# Patient Record
Sex: Female | Born: 1995 | Race: Black or African American | Hispanic: No | Marital: Single | Smoking: Never smoker
Health system: Southern US, Community
[De-identification: ages and names within clinical notes are randomized; demographics above are authoritative.]

## PROBLEM LIST (undated history)

## (undated) ENCOUNTER — Inpatient Hospital Stay (HOSPITAL_COMMUNITY): Payer: Self-pay

## (undated) DIAGNOSIS — G43009 Migraine without aura, not intractable, without status migrainosus: Secondary | ICD-10-CM

## (undated) DIAGNOSIS — F329 Major depressive disorder, single episode, unspecified: Secondary | ICD-10-CM

## (undated) DIAGNOSIS — D571 Sickle-cell disease without crisis: Secondary | ICD-10-CM

## (undated) DIAGNOSIS — F32A Depression, unspecified: Secondary | ICD-10-CM

## (undated) DIAGNOSIS — N390 Urinary tract infection, site not specified: Secondary | ICD-10-CM

## (undated) HISTORY — DX: Depression, unspecified: F32.A

## (undated) HISTORY — PX: NO PAST SURGERIES: SHX2092

## (undated) HISTORY — DX: Migraine without aura, not intractable, without status migrainosus: G43.009

## (undated) HISTORY — PX: WISDOM TOOTH EXTRACTION: SHX21

## (undated) HISTORY — DX: Major depressive disorder, single episode, unspecified: F32.9

## (undated) SURGERY — Surgical Case
Anesthesia: *Unknown

---
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
## 2013-02-02 ENCOUNTER — Emergency Department (INDEPENDENT_AMBULATORY_CARE_PROVIDER_SITE_OTHER)
Admission: EM | Admit: 2013-02-02 | Discharge: 2013-02-02 | Disposition: A | Discharge status: Home / Self Care | Payer: No Typology Code available for payment source | Source: Home / Self Care | Attending: Emergency Medicine | Admitting: Emergency Medicine

## 2013-02-02 ENCOUNTER — Telehealth: Payer: Self-pay

## 2013-02-02 ENCOUNTER — Encounter (HOSPITAL_COMMUNITY): Payer: Self-pay | Admitting: Emergency Medicine

## 2013-02-02 DIAGNOSIS — N3000 Acute cystitis without hematuria: Secondary | ICD-10-CM

## 2013-02-02 LAB — POCT URINALYSIS DIP (DEVICE)
Nitrite: POSITIVE — AB
Protein, ur: NEGATIVE mg/dL
pH: 7 (ref 5.0–8.0)

## 2013-02-02 MED ORDER — CEPHALEXIN 500 MG PO CAPS: 500.0000 mg | ORAL_CAPSULE | Freq: Three times a day (TID) | ORAL | Status: DC

## 2013-02-02 NOTE — ED Notes (Signed)
Patient not in room-in bathroom collecting specimen

## 2013-02-02 NOTE — ED Notes (Signed)
Patient reports having uti symptoms.  Reports onset yesterday.  Reports burning with urination, frequency with small amounts of urine.  Started otc azo yesterday-has had one dose

## 2013-02-02 NOTE — ED Provider Notes (Signed)
Chief Complaint:   Chief Complaint  Patient presents with  . Urinary Tract Infection    History of Present Illness:   Makayla Fletcher is an 17 year old female with [place] disease who is followed at the sickle cell clinic at [facility]. She presents today with a two-day history of urinary frequency, urgency, and dysuria. She denies any hematuria or malodorous urine. She's had some abdominal soreness but denies any back pain, fever, chills, nausea, vomiting, or GYN symptoms. She's had 2 UTIs in the past. She thinks that use of a tampon might have brought this about. She has never had to be hospitalized or received intravenous fluids her antibiotics for urinary tract infection.  Review of Systems:  Other than noted above, the patient denies any of the following symptoms: General:  No fevers, chills, sweats, aches, or fatigue. GI:  No abdominal pain, back pain, nausea, vomiting, diarrhea, or constipation. GU:  No dysuria, frequency, urgency, hematuria, or incontinence. GYN:  No discharge, itching, vulvar pain or lesions, pelvic pain, or abnormal vaginal bleeding.  PMFSH:  Past medical history, family history, social history, meds, and allergies were reviewed.  She takes birth control pills.  Physical Exam:   Vital signs:  BP 114/77  Pulse 90  Temp(Src) 98 F (36.7 C) (Oral)  Resp 16  SpO2 100%  LMP 01/31/2013 Gen:  Alert, oriented, in no distress. Lungs:  Clear to auscultation, no wheezes, rales or rhonchi. Heart:  Regular rhythm, no gallop or murmer. Abdomen:  Flat and soft. There was slight suprapubic pain to palpation.  No guarding, or rebound.  No hepato-splenomegaly or mass.  Bowel sounds were normally active.  No hernia. Back:  No CVA tenderness.  Skin:  Clear, warm and dry.  Labs:    Results for orders placed during the hospital encounter of 02/02/13  POCT URINALYSIS DIP (DEVICE)      Result Value Range   Glucose, UA 100 (*) NEGATIVE mg/dL   Bilirubin Urine NEGATIVE   NEGATIVE   Ketones, ur NEGATIVE  NEGATIVE mg/dL   Specific Gravity, Urine 1.015  1.005 - 1.030   Hgb urine dipstick TRACE (*) NEGATIVE   pH 7.0  5.0 - 8.0   Protein, ur NEGATIVE  NEGATIVE mg/dL   Urobilinogen, UA 1.0  0.0 - 1.0 mg/dL   Nitrite POSITIVE (*) NEGATIVE   Leukocytes, UA MODERATE (*) NEGATIVE  POCT PREGNANCY, URINE      Result Value Range   Preg Test, Ur NEGATIVE  NEGATIVE     A urine culture was obtained.  Results are pending at this time and we will call about any positive results.  Assessment: The encounter diagnosis was Acute cystitis.   No evidence of acute pyelonephritis.  Plan:   1.  The following meds were prescribed:   New Prescriptions   CEPHALEXIN (KEFLEX) 500 MG CAPSULE    Take 1 capsule (500 mg total) by mouth 3 (three) times daily.   2.  The patient was instructed in symptomatic care and handouts were given. 3.  The patient was told to return if becoming worse in any way, if no better in 3 or 4 days, and given some red flag symptoms such as fever, vomiting, or severe pain that would indicate earlier return. 4.  The patient was told to avoid intercourse for 10 days, get extra fluids, and return for a follow up with her primary care doctor at the completion of treatment for a repeat UA and culture.  Reuben Likes, MD 02/02/13 [phone]

## 2013-02-02 NOTE — ED Notes (Signed)
Urine sample in lab.  

## 2013-02-02 NOTE — Telephone Encounter (Signed)
This CM spoke with Makayla Fletcher to schedule a follow up appt with Dr. Ashley Royalty on 02/13/13@ 4pm. This is in lieu of her appt today to urgent care.     Karoline Caldwell, RN, BSN, [place]   [phone]

## 2013-02-04 LAB — URINE CULTURE

## 2013-02-04 NOTE — ED Notes (Signed)
Urine culture: >100,000 colonies Klebsiella pneumoniae.  Pt. adequately treated with Keflex. Vassie Moselle 02/04/2013

## 2013-02-09 ENCOUNTER — Emergency Department (INDEPENDENT_AMBULATORY_CARE_PROVIDER_SITE_OTHER)
Admission: EM | Admit: 2013-02-09 | Discharge: 2013-02-09 | Disposition: A | Discharge status: Home / Self Care | Payer: No Typology Code available for payment source | Source: Home / Self Care | Attending: Emergency Medicine | Admitting: Emergency Medicine

## 2013-02-09 ENCOUNTER — Ambulatory Visit: Payer: No Typology Code available for payment source | Admitting: Internal Medicine

## 2013-02-09 ENCOUNTER — Encounter (HOSPITAL_COMMUNITY): Payer: Self-pay | Admitting: Emergency Medicine

## 2013-02-09 DIAGNOSIS — N3000 Acute cystitis without hematuria: Secondary | ICD-10-CM

## 2013-02-09 DIAGNOSIS — B373 Candidiasis of vulva and vagina: Secondary | ICD-10-CM

## 2013-02-09 LAB — POCT URINALYSIS DIP (DEVICE)
Hgb urine dipstick: NEGATIVE
Ketones, ur: NEGATIVE mg/dL
Protein, ur: NEGATIVE mg/dL
Specific Gravity, Urine: 1.02 (ref 1.005–1.030)
pH: 7 (ref 5.0–8.0)

## 2013-02-09 LAB — POCT PREGNANCY, URINE: Preg Test, Ur: NEGATIVE

## 2013-02-09 MED ORDER — FLUCONAZOLE 150 MG PO TABS: 150.0000 mg | ORAL_TABLET | Freq: Once | ORAL | Status: DC

## 2013-02-09 NOTE — ED Provider Notes (Addendum)
Chief Complaint:   Chief Complaint  Patient presents with  . Vaginal Discharge    History of Present Illness:   Makayla Fletcher is an 17 year old female whom I saw her about a week ago for a UTI. She is finishing up her prescription for cephalexin. She has had improvement in a UTI symptoms. No dysuria, frequency, urgency, or hematuria. She denies fever, abdominal pain, or lower back pain. Her on another complaint has been vaginal itching and discharge. She denies any prior history of yeast infection. Her urine culture grew out Klebsiella pneumoniae which was sensitive to cephalexin.  Review of Systems:  Other than noted above, the patient denies any of the following symptoms: Systemic:  No fever, chills, sweats, or weight loss. GI:  No abdominal pain, nausea, anorexia, vomiting, diarrhea, constipation, melena or hematochezia. GU:  No dysuria, frequency, urgency, hematuria, vaginal discharge, itching, or abnormal vaginal bleeding. Skin:  No rash or itching.  PMFSH:  Past medical history, family history, social history, meds, and allergies were reviewed.    Physical Exam:   Vital signs:  BP 111/76  Pulse 86  Temp(Src) 98.1 F (36.7 C) (Oral)  Resp 16  SpO2 100%  LMP 01/31/2013 General:  Alert, oriented and in no distress. Lungs:  Breath sounds clear and equal bilaterally.  No wheezes, rales or rhonchi. Heart:  Regular rhythm.  No gallops or murmers. Abdomen:  Soft, flat and non-distended.  No organomegaly or mass.  No tenderness, guarding or rebound.  Bowel sounds normally active. Pelvic exam:  Normal external genitalia. There was a very small amount of clumpy, white, vaginal discharge. No pain on cervical motion. Vaginal and cervical mucosa were normal. Uterus was midposition and normal in size and shape and nontender. No adnexal masses or tenderness. Skin:  Clear, warm and dry.  Labs:   Results for orders placed during the hospital encounter of 02/09/13  POCT URINALYSIS DIP  (DEVICE)      Result Value Range   Glucose, UA NEGATIVE  NEGATIVE mg/dL   Bilirubin Urine NEGATIVE  NEGATIVE   Ketones, ur NEGATIVE  NEGATIVE mg/dL   Specific Gravity, Urine 1.020  1.005 - 1.030   Hgb urine dipstick NEGATIVE  NEGATIVE   pH 7.0  5.0 - 8.0   Protein, ur NEGATIVE  NEGATIVE mg/dL   Urobilinogen, UA 1.0  0.0 - 1.0 mg/dL   Nitrite NEGATIVE  NEGATIVE   Leukocytes, UA TRACE (*) NEGATIVE  POCT PREGNANCY, URINE      Result Value Range   Preg Test, Ur NEGATIVE  NEGATIVE    Assessment:  The encounter diagnosis was Candida vaginitis.  Plan:   1.  The following meds were prescribed:   Discharge Medication List as of 02/09/2013 12:34 PM    START taking these medications   Details  fluconazole (DIFLUCAN) 150 MG tablet Take 1 tablet (150 mg total) by mouth once., Starting 02/09/2013, Normal       2.  The patient was instructed in symptomatic care and handouts were given. 3.  The patient was told to return if becoming worse in any way, if no better in 3 or 4 days, and given some red flag symptoms such as pelvic or vaginal pain that would indicate earlier return. 4.  Follow up here if necessary.    Reuben Likes, MD 02/09/13 1327  Reuben Likes, MD 02/09/13 [phone]

## 2013-02-09 NOTE — ED Notes (Signed)
Examined by dr Lorenz Coaster

## 2013-02-10 NOTE — ED Notes (Signed)
GC/Chlamydia neg., Affirm: Candida pos., Gardnerella and Trich neg.  Pt. Adequately treated with Diflucan. Vassie Moselle 02/10/2013

## 2013-02-13 ENCOUNTER — Ambulatory Visit: Payer: No Typology Code available for payment source | Admitting: Internal Medicine

## 2013-03-23 ENCOUNTER — Telehealth: Payer: Self-pay

## 2013-03-23 NOTE — Telephone Encounter (Signed)
This CM returned a call from [name], however reached a voicemail and left message.   This CM received a call back from [name], re: how to get Medicaid. [name] states she turned 17 years old and her Medicaid ended and she does not know what to do. This CM advised she can go to the [employer] and speak with a caseworker, or she can access application online, or she can contact [place] community agency to get assistance with completing Medicaid application. [name] verbalized understanding and will followup independently.This CM clarified with [name] that she needs to schedule an appointment to establish care with Dr. Ashley Royalty. Previously she had [facility] on her card however did not keep her appointment to establish care. [name] verbalized understanding.    Makayla Caldwell, RN, BSN, [place]     [phone]

## 2013-04-03 ENCOUNTER — Emergency Department (HOSPITAL_COMMUNITY)
Admission: EM | Admit: 2013-04-03 | Discharge: 2013-04-03 | Disposition: A | Discharge status: Home / Self Care | Payer: Medicaid Other | Attending: Emergency Medicine | Admitting: Emergency Medicine

## 2013-04-03 ENCOUNTER — Encounter (HOSPITAL_COMMUNITY): Payer: Self-pay | Admitting: Emergency Medicine

## 2013-04-03 DIAGNOSIS — Z792 Long term (current) use of antibiotics: Secondary | ICD-10-CM | POA: Insufficient documentation

## 2013-04-03 DIAGNOSIS — N390 Urinary tract infection, site not specified: Secondary | ICD-10-CM | POA: Insufficient documentation

## 2013-04-03 DIAGNOSIS — R300 Dysuria: Secondary | ICD-10-CM

## 2013-04-03 DIAGNOSIS — Z8659 Personal history of other mental and behavioral disorders: Secondary | ICD-10-CM | POA: Insufficient documentation

## 2013-04-03 DIAGNOSIS — Z3202 Encounter for pregnancy test, result negative: Secondary | ICD-10-CM | POA: Insufficient documentation

## 2013-04-03 DIAGNOSIS — Z862 Personal history of diseases of the blood and blood-forming organs and certain disorders involving the immune mechanism: Secondary | ICD-10-CM | POA: Insufficient documentation

## 2013-04-03 LAB — URINALYSIS, ROUTINE W REFLEX MICROSCOPIC
Bilirubin Urine: NEGATIVE
Hgb urine dipstick: NEGATIVE
Nitrite: NEGATIVE
Specific Gravity, Urine: 1.018 (ref 1.005–1.030)
pH: 6 (ref 5.0–8.0)

## 2013-04-03 LAB — URINE MICROSCOPIC-ADD ON

## 2013-04-03 MED ORDER — PHENAZOPYRIDINE HCL 200 MG PO TABS: 200.0000 mg | ORAL_TABLET | Freq: Three times a day (TID) | ORAL | Status: DC | PRN

## 2013-04-03 MED ORDER — CIPROFLOXACIN HCL 500 MG PO TABS: 500.0000 mg | ORAL_TABLET | Freq: Two times a day (BID) | ORAL | Status: DC

## 2013-04-03 NOTE — ED Notes (Signed)
Pt c/o bilateral flank pain, urinary frequency and painful urination. sts she has hx of UTIs. Denies vaginal d/c. Pt sitting up on edge of bed talking to boyfriend. Pt in nad, skin warm and dry, resp e/u.

## 2013-04-03 NOTE — ED Notes (Signed)
Bi-lateral flank pain since Friday with painful urination since yesterday.  No blood in urine.No LMP recorded.  lmp sept 30

## 2013-04-03 NOTE — ED Provider Notes (Signed)
Medical screening examination/treatment/procedure(s) were performed by non-physician practitioner and as supervising physician I was immediately available for consultation/collaboration.   Darlys Gales, MD 04/03/13 [phone]

## 2013-04-03 NOTE — ED Provider Notes (Signed)
CSN: [id number]     Arrival date & time 04/03/13  4098 History   First MD Initiated Contact with Patient 04/03/13 0645     Chief Complaint  Patient presents with  . Back Pain   (Consider location/radiation/quality/duration/timing/severity/associated sxs/prior Treatment) Patient is a 17 y.o. female presenting with back pain. The history is provided by the patient and medical records.  Back Pain Associated symptoms: dysuria    This is a 17 y.o. Female with PMH significant for frequent UTIs, depression, and sickle cell anemia, presenting to the ED for dysuria, urinary frequency, and bilateral flank pain.  No hematuria or vaginal complaints.  Pt was recently treated for a UTI by her PCP-- states initially sx improved but have since returned.  She notes she does not drink water regularly, mostly soda.  Last urine culture grew Klebsiella pneumoniae.  No fevers, sweats, or chills.  No abdominal pain.  No hx of kidney stones.  Past Medical History  Diagnosis Date  . Sickle cell anemia   . Urinary tract infection July 2013    frequent   . Depression    Past Surgical History  Procedure Laterality Date  . No past surgeries     No family history on file. History  Substance Use Topics  . Smoking status: Never Smoker   . Smokeless tobacco: Never Used  . Alcohol Use: No   OB History   Grav Para Term Preterm Abortions TAB SAB Ect Mult Living                 Review of Systems  Genitourinary: Positive for dysuria, frequency and flank pain.  Musculoskeletal: Positive for back pain.  All other systems reviewed and are negative.    Allergies  Review of patient's allergies indicates no known allergies.  Home Medications   Current Outpatient Rx  Name  Route  Sig  Dispense  Refill  . acetaminophen (TYLENOL) 500 MG tablet   Oral   Take 500 mg by mouth every 6 (six) hours as needed. For pain         . cephALEXin (KEFLEX) 500 MG capsule   Oral   Take 1 capsule (500 mg total) by mouth  3 (three) times daily.   30 capsule   0   . fluconazole (DIFLUCAN) 150 MG tablet   Oral   Take 1 tablet (150 mg total) by mouth once.   1 tablet   5   . HYDROcodone-acetaminophen (NORCO/VICODIN) 5-325 MG per tablet   Oral   Take 1-2 tablets by mouth every 4 (four) hours as needed for pain.   30 tablet   0   . levofloxacin (LEVAQUIN) 750 MG tablet   Oral   Take 1 tablet (750 mg total) by mouth daily.   7 tablet   0   . naproxen (NAPROSYN) 500 MG tablet   Oral   Take 500 mg by mouth 2 (two) times daily as needed. For pain         . ondansetron (ZOFRAN ODT) 8 MG disintegrating tablet   Oral   Take 1 tablet (8 mg total) by mouth every 8 (eight) hours as needed for nausea.   15 tablet   0    BP 122/82  Pulse 85  Temp(Src) 97.3 F (36.3 C) (Oral)  Resp 18  SpO2 100%  LMP 03/21/2013  Physical Exam  Nursing note and vitals reviewed. Constitutional: She is oriented to person, place, and time. She appears well-developed and well-nourished. No distress.  HENT:  Head: Normocephalic and atraumatic.  Mouth/Throat: Oropharynx is clear and moist.  Eyes: Conjunctivae and EOM are normal. Pupils are equal, round, and reactive to light.  Neck: Normal range of motion.  Cardiovascular: Normal rate, regular rhythm and normal heart sounds.   Pulmonary/Chest: Effort normal and breath sounds normal. No respiratory distress. She has no wheezes.  Abdominal: There is CVA tenderness (bilateral).  Musculoskeletal: Normal range of motion.  Neurological: She is alert and oriented to person, place, and time.  Skin: Skin is warm and dry. She is not diaphoretic.  Psychiatric: She has a normal mood and affect.    ED Course  Procedures (including critical care time)  Labs Review Labs Reviewed  URINALYSIS, ROUTINE W REFLEX MICROSCOPIC - Abnormal; Notable for the following:    Color, Urine AMBER (*)    APPearance CLOUDY (*)    Leukocytes, UA LARGE (*)    All other components within  normal limits  URINE MICROSCOPIC-ADD ON - Abnormal; Notable for the following:    Bacteria, UA FEW (*)    All other components within normal limits  URINE CULTURE  POCT PREGNANCY, URINE   Imaging Review No results found.  EKG Interpretation   None       MDM   1. UTI (lower urinary tract infection)   2. Dysuria    u-preg negative.  U/a as above, large leuks but nitrite negative, culture pending.  Pt is afebrile but given bilateral flank pain and UTI sx, will tx with cipro and pyridium for possible pyelonephritis.  Doubt kidney stones, infected stone, or urinary obstruction.  FU with [facility] urgent care if problems occur.  Discussed plan with pt, she agreed.  Return precautions advised.  Garlon Hatchet, PA-C 04/03/13 [phone]

## 2013-04-04 LAB — URINE CULTURE

## 2013-06-23 ENCOUNTER — Encounter (HOSPITAL_COMMUNITY): Payer: Self-pay | Admitting: Emergency Medicine

## 2013-06-23 ENCOUNTER — Emergency Department (HOSPITAL_COMMUNITY)
Admission: EM | Admit: 2013-06-23 | Discharge: 2013-06-23 | Disposition: A | Discharge status: Home / Self Care | Payer: Medicaid Other | Attending: Emergency Medicine | Admitting: Emergency Medicine

## 2013-06-23 DIAGNOSIS — Z79899 Other long term (current) drug therapy: Secondary | ICD-10-CM | POA: Insufficient documentation

## 2013-06-23 DIAGNOSIS — Z3202 Encounter for pregnancy test, result negative: Secondary | ICD-10-CM | POA: Insufficient documentation

## 2013-06-23 DIAGNOSIS — N3090 Cystitis, unspecified without hematuria: Secondary | ICD-10-CM | POA: Insufficient documentation

## 2013-06-23 DIAGNOSIS — Z8659 Personal history of other mental and behavioral disorders: Secondary | ICD-10-CM | POA: Insufficient documentation

## 2013-06-23 DIAGNOSIS — Z862 Personal history of diseases of the blood and blood-forming organs and certain disorders involving the immune mechanism: Secondary | ICD-10-CM | POA: Insufficient documentation

## 2013-06-23 LAB — URINE MICROSCOPIC-ADD ON

## 2013-06-23 LAB — URINALYSIS, ROUTINE W REFLEX MICROSCOPIC
BILIRUBIN URINE: NEGATIVE
Glucose, UA: NEGATIVE mg/dL
HGB URINE DIPSTICK: NEGATIVE
KETONES UR: 15 mg/dL — AB
Nitrite: NEGATIVE
PH: 5.5 (ref 5.0–8.0)
Protein, ur: NEGATIVE mg/dL
SPECIFIC GRAVITY, URINE: 1.02 (ref 1.005–1.030)
Urobilinogen, UA: 0.2 mg/dL (ref 0.0–1.0)

## 2013-06-23 LAB — POCT PREGNANCY, URINE: Preg Test, Ur: NEGATIVE

## 2013-06-23 MED ORDER — SULFAMETHOXAZOLE-TRIMETHOPRIM 800-160 MG PO TABS: 1.0000 | ORAL_TABLET | Freq: Two times a day (BID) | ORAL | Status: DC

## 2013-06-23 MED ORDER — PHENAZOPYRIDINE HCL 200 MG PO TABS: 200.0000 mg | ORAL_TABLET | Freq: Three times a day (TID) | ORAL | Status: DC | PRN

## 2013-06-23 MED ORDER — PHENAZOPYRIDINE HCL 100 MG PO TABS
200.0000 mg | ORAL_TABLET | Freq: Once | ORAL | Status: AC
  Administered 2013-06-23: 200 mg via ORAL
  Filled 2013-06-23: qty 2

## 2013-06-23 MED ORDER — SULFAMETHOXAZOLE-TMP DS 800-160 MG PO TABS
1.0000 | ORAL_TABLET | Freq: Once | ORAL | Status: AC
  Administered 2013-06-23: 1 via ORAL
  Filled 2013-06-23: qty 1

## 2013-06-23 NOTE — ED Provider Notes (Signed)
CSN: [id number]     Arrival date & time 06/23/13  1622 History   First MD Initiated Contact with Patient 06/23/13 1726     Chief Complaint  Patient presents with  . Urinary Tract Infection   (Consider location/radiation/quality/duration/timing/severity/associated sxs/prior Treatment) HPI Complains of burning on urination urethral meatus onset today. Feels like urinary tract infections she's had in the past. No treatment prior to coming here pain is worse with urination not improved by anything. No fever no abdominal pain no back pain no nausea vomiting no other associated symptoms. Past Medical History  Diagnosis Date  . Sickle cell anemia   . Urinary tract infection July 2013    frequent   . Depression    Past Surgical History  Procedure Laterality Date  . No past surgeries     History reviewed. No pertinent family history. History  Substance Use Topics  . Smoking status: Never Smoker   . Smokeless tobacco: Never Used  . Alcohol Use: No   OB History   Grav Para Term Preterm Abortions TAB SAB Ect Mult Living                 Review of Systems  Constitutional: Negative.   HENT: Negative.   Respiratory: Negative.   Cardiovascular: Negative.   Gastrointestinal: Negative.   Genitourinary: Positive for dysuria.  Musculoskeletal: Negative.   Skin: Negative.   Neurological: Negative.   Psychiatric/Behavioral: Negative.   All other systems reviewed and are negative.    Allergies  Review of patient's allergies indicates no known allergies.  Home Medications   Current Outpatient Rx  Name  Route  Sig  Dispense  Refill  . ibuprofen (ADVIL,MOTRIN) 200 MG tablet   Oral   Take 400 mg by mouth every 6 (six) hours as needed for moderate pain.         . Norethindrone Acetate-Ethinyl Estrad-FE (LOMEDIA 24 FE) 1-20 MG-MCG(24) tablet   Oral   Take 1 tablet by mouth daily.          BP 115/79  Pulse 97  Temp(Src) 99.7 F (37.6 C) (Oral)  Resp 15  Wt 114 lb 1.6 oz  (51.755 kg)  SpO2 100% Physical Exam  Nursing note and vitals reviewed. Constitutional: She appears well-developed and well-nourished.  HENT:  Head: Normocephalic and atraumatic.  Eyes: Conjunctivae are normal. Pupils are equal, round, and reactive to light.  Neck: Neck supple. No tracheal deviation present. No thyromegaly present.  Cardiovascular: Normal rate and regular rhythm.   No murmur heard. Pulmonary/Chest: Effort normal and breath sounds normal.  Abdominal: Soft. Bowel sounds are normal. She exhibits no distension. There is no tenderness.  Musculoskeletal: Normal range of motion. She exhibits no edema and no tenderness.  Neurological: She is alert. Coordination normal.  Skin: Skin is warm and dry. No rash noted.  Psychiatric: She has a normal mood and affect.    ED Course  Procedures (including critical care time) Labs Review Labs Reviewed  URINALYSIS, ROUTINE W REFLEX MICROSCOPIC - Abnormal; Notable for the following:    Color, Urine AMBER (*)    APPearance HAZY (*)    Ketones, ur 15 (*)    Leukocytes, UA MODERATE (*)    All other components within normal limits  URINE MICROSCOPIC-ADD ON - Abnormal; Notable for the following:    Squamous Epithelial / LPF FEW (*)    Bacteria, UA FEW (*)    All other components within normal limits  URINE CULTURE  POCT PREGNANCY, URINE  Imaging Review No results found.  EKG Interpretation   None      Results for orders placed during the hospital encounter of 06/23/13  URINALYSIS, ROUTINE W REFLEX MICROSCOPIC      Result Value Range   Color, Urine AMBER (*) YELLOW   APPearance HAZY (*) CLEAR   Specific Gravity, Urine 1.020  1.005 - 1.030   pH 5.5  5.0 - 8.0   Glucose, UA NEGATIVE  NEGATIVE mg/dL   Hgb urine dipstick NEGATIVE  NEGATIVE   Bilirubin Urine NEGATIVE  NEGATIVE   Ketones, ur 15 (*) NEGATIVE mg/dL   Protein, ur NEGATIVE  NEGATIVE mg/dL   Urobilinogen, UA 0.2  0.0 - 1.0 mg/dL   Nitrite NEGATIVE  NEGATIVE    Leukocytes, UA MODERATE (*) NEGATIVE  URINE MICROSCOPIC-ADD ON      Result Value Range   Squamous Epithelial / LPF FEW (*) RARE   WBC, UA 21-50  <3 WBC/hpf   Bacteria, UA FEW (*) RARE   Urine-Other MUCOUS PRESENT    POCT PREGNANCY, URINE      Result Value Range   Preg Test, Ur NEGATIVE  NEGATIVE   No results found.  MDM  No diagnosis found. Plan prescriptions are written, Bactrim DS Follow up with Dr. Ashley RoyaltyMatthews if not better in 3 days Diagnosis cystitis    Doug SouSam Kiante Ciavarella, MD 06/23/13 1827

## 2013-06-23 NOTE — Discharge Instructions (Signed)
Urinary Tract Infection °A urinary tract infection (UTI) can occur any place along the urinary tract. The tract includes the kidneys, ureters, bladder, and urethra. A type of germ called bacteria often causes a UTI. UTIs are often helped with antibiotic medicine.  °HOME CARE  °· If given, take antibiotics as told by your doctor. Finish them even if you start to feel better. °· Drink enough fluids to keep your pee (urine) clear or pale yellow. °· Avoid tea, drinks with caffeine, and bubbly (carbonated) drinks. °· Pee often. Avoid holding your pee in for a long time. °· Pee before and after having sex (intercourse). °· Wipe from front to back after you poop (bowel movement) if you are a woman. Use each tissue only once. °GET HELP RIGHT AWAY IF:  °· You have back pain. °· You have lower belly (abdominal) pain. °· You have chills. °· You feel sick to your stomach (nauseous). °· You throw up (vomit). °· Your burning or discomfort with peeing does not go away. °· You have a fever. °· Your symptoms are not better in 3 days. °MAKE SURE YOU:  °· Understand these instructions. °· Will watch your condition. °· Will get help right away if you are not doing well or get worse. °Document Released: 11/25/2007 Document Revised: 03/02/2012 Document Reviewed: 01/07/2012 °[employer]® Patient Information ©2014 [employer]. ° °

## 2013-06-23 NOTE — ED Notes (Signed)
Pt c/o dysuria since this am. Reports hx uti and this feels the same

## 2013-06-24 ENCOUNTER — Encounter (HOSPITAL_COMMUNITY): Payer: Self-pay | Admitting: Emergency Medicine

## 2013-06-24 DIAGNOSIS — R51 Headache: Secondary | ICD-10-CM | POA: Insufficient documentation

## 2013-06-24 DIAGNOSIS — Z87440 Personal history of urinary (tract) infections: Secondary | ICD-10-CM | POA: Insufficient documentation

## 2013-06-24 DIAGNOSIS — R5383 Other fatigue: Secondary | ICD-10-CM

## 2013-06-24 DIAGNOSIS — H53149 Visual discomfort, unspecified: Secondary | ICD-10-CM | POA: Insufficient documentation

## 2013-06-24 DIAGNOSIS — Z8659 Personal history of other mental and behavioral disorders: Secondary | ICD-10-CM | POA: Insufficient documentation

## 2013-06-24 DIAGNOSIS — R5381 Other malaise: Secondary | ICD-10-CM | POA: Insufficient documentation

## 2013-06-24 DIAGNOSIS — E860 Dehydration: Secondary | ICD-10-CM | POA: Insufficient documentation

## 2013-06-24 DIAGNOSIS — R112 Nausea with vomiting, unspecified: Secondary | ICD-10-CM | POA: Insufficient documentation

## 2013-06-24 DIAGNOSIS — Z862 Personal history of diseases of the blood and blood-forming organs and certain disorders involving the immune mechanism: Secondary | ICD-10-CM | POA: Insufficient documentation

## 2013-06-24 DIAGNOSIS — J3489 Other specified disorders of nose and nasal sinuses: Secondary | ICD-10-CM | POA: Insufficient documentation

## 2013-06-24 DIAGNOSIS — Z79899 Other long term (current) drug therapy: Secondary | ICD-10-CM | POA: Insufficient documentation

## 2013-06-24 NOTE — ED Notes (Signed)
Last nigh, real bad headache leading to vomiting and made her eyes red. Worse today. Took Excedrin and ibruprofen with no releif. Family hx. Of migraines.

## 2013-06-25 ENCOUNTER — Emergency Department (HOSPITAL_COMMUNITY)
Admission: EM | Admit: 2013-06-25 | Discharge: 2013-06-25 | Disposition: A | Discharge status: Home / Self Care | Payer: Medicaid Other | Attending: Emergency Medicine | Admitting: Emergency Medicine

## 2013-06-25 DIAGNOSIS — R519 Headache, unspecified: Secondary | ICD-10-CM

## 2013-06-25 DIAGNOSIS — R51 Headache: Secondary | ICD-10-CM

## 2013-06-25 DIAGNOSIS — E860 Dehydration: Secondary | ICD-10-CM

## 2013-06-25 LAB — COMPREHENSIVE METABOLIC PANEL
ALBUMIN: 3.9 g/dL (ref 3.5–5.2)
ALK PHOS: 51 U/L (ref 39–117)
ALT: 7 U/L (ref 0–35)
AST: 18 U/L (ref 0–37)
BILIRUBIN TOTAL: 1.3 mg/dL — AB (ref 0.3–1.2)
BUN: 6 mg/dL (ref 6–23)
CHLORIDE: 101 meq/L (ref 96–112)
CO2: 23 mEq/L (ref 19–32)
CREATININE: 0.74 mg/dL (ref 0.50–1.10)
Calcium: 8.9 mg/dL (ref 8.4–10.5)
GFR calc Af Amer: >90 mL/min (ref >90)
GFR calc non Af Amer: >90 mL/min (ref >90)
Glucose, Bld: 81 mg/dL (ref 70–99)
POTASSIUM: 4.7 meq/L (ref 3.7–5.3)
Sodium: 137 mEq/L (ref 137–147)
TOTAL PROTEIN: 8 g/dL (ref 6.0–8.3)

## 2013-06-25 LAB — CBC WITH DIFFERENTIAL/PLATELET
Basophils Absolute: 0 10*3/uL (ref 0.0–0.1)
Basophils Relative: 0 % (ref 0–1)
EOS PCT: 7 % — AB (ref 0–5)
Eosinophils Absolute: 0.5 10*3/uL (ref 0.0–0.7)
HEMATOCRIT: 30.8 % — AB (ref 36.0–46.0)
HEMOGLOBIN: 11 g/dL — AB (ref 12.0–15.0)
LYMPHS ABS: 0.9 10*3/uL (ref 0.7–4.0)
LYMPHS PCT: 12 % (ref 12–46)
MCH: 30.1 pg (ref 26.0–34.0)
MCHC: 35.7 g/dL (ref 30.0–36.0)
MCV: 84.2 fL (ref 78.0–100.0)
MONO ABS: 0.4 10*3/uL (ref 0.1–1.0)
MONOS PCT: 5 % (ref 3–12)
Neutro Abs: 5.5 10*3/uL (ref 1.7–7.7)
Neutrophils Relative %: 76 % (ref 43–77)
PLATELETS: 94 10*3/uL — AB (ref 150–400)
RBC: 3.66 MIL/uL — AB (ref 3.87–5.11)
RDW: 13.4 % (ref 11.5–15.5)
WBC: 7.3 10*3/uL (ref 4.0–10.5)

## 2013-06-25 LAB — URINALYSIS, ROUTINE W REFLEX MICROSCOPIC
Bilirubin Urine: NEGATIVE
GLUCOSE, UA: NEGATIVE mg/dL
Hgb urine dipstick: NEGATIVE
Ketones, ur: >80 mg/dL — AB
LEUKOCYTES UA: NEGATIVE
Nitrite: NEGATIVE
PROTEIN: NEGATIVE mg/dL
Specific Gravity, Urine: 1.013 (ref 1.005–1.030)
UROBILINOGEN UA: 0.2 mg/dL (ref 0.0–1.0)
pH: 5.5 (ref 5.0–8.0)

## 2013-06-25 LAB — URINE CULTURE

## 2013-06-25 MED ORDER — KETOROLAC TROMETHAMINE 30 MG/ML IJ SOLN
30.0000 mg | Freq: Once | INTRAMUSCULAR | Status: AC
Start: 2013-06-25 — End: 2013-06-25
  Administered 2013-06-25: 30 mg via INTRAVENOUS
  Filled 2013-06-25: qty 1

## 2013-06-25 MED ORDER — SODIUM CHLORIDE 0.9 % IV BOLUS (SEPSIS)
1000.0000 mL | Freq: Once | INTRAVENOUS | Status: AC
  Administered 2013-06-25: 1000 mL via INTRAVENOUS

## 2013-06-25 MED ORDER — METOCLOPRAMIDE HCL 5 MG/ML IJ SOLN
10.0000 mg | Freq: Once | INTRAMUSCULAR | Status: AC
  Administered 2013-06-25: 10 mg via INTRAVENOUS
  Filled 2013-06-25: qty 2

## 2013-06-25 MED ORDER — OXYMETAZOLINE HCL 0.05 % NA SOLN: 1.0000 | Freq: Two times a day (BID) | NASAL | Status: DC

## 2013-06-25 MED ORDER — MORPHINE SULFATE 4 MG/ML IJ SOLN
4.0000 mg | Freq: Once | INTRAMUSCULAR | Status: AC
  Administered 2013-06-25: 4 mg via INTRAVENOUS
  Filled 2013-06-25: qty 1

## 2013-06-25 MED ORDER — MOMETASONE FUROATE 50 MCG/ACT NA SUSP: 2.0000 | Freq: Every day | NASAL | Status: DC

## 2013-06-25 MED ORDER — METOCLOPRAMIDE HCL 10 MG PO TABS: 10.0000 mg | ORAL_TABLET | Freq: Four times a day (QID) | ORAL | Status: DC | PRN

## 2013-06-25 MED ORDER — IBUPROFEN 600 MG PO TABS: 600.0000 mg | ORAL_TABLET | Freq: Four times a day (QID) | ORAL | Status: DC | PRN

## 2013-06-25 NOTE — ED Notes (Signed)
MD at [facility]

## 2013-06-25 NOTE — Discharge Instructions (Signed)
Sinus Headache A sinus headache is when your sinuses become clogged or swollen. Sinus headaches can range from mild to severe.  CAUSES A sinus headache can have different causes, such as:  Colds.  Sinus infections.  Allergies. SYMPTOMS  Symptoms of a sinus headache may vary and can include:  Headache.  Pain or pressure in the face.  Congested or runny nose.  Fever.  Inability to smell.  Pain in upper teeth. Weather changes can make symptoms worse. TREATMENT  The treatment of a sinus headache depends on the cause.  Sinus pain caused by a sinus infection may be treated with antibiotic medicine.  Sinus pain caused by allergies may be helped by allergy medicines (antihistamines) and medicated nasal sprays.  Sinus pain caused by congestion may be helped by flushing the nose and sinuses with saline solution. HOME CARE INSTRUCTIONS   If antibiotics are prescribed, take them as directed. Finish them even if you start to feel better.  Only take over-the-counter or prescription medicines for pain, discomfort, or fever as directed by your caregiver.  If you have congestion, use a nasal spray to help reduce pressure. SEEK IMMEDIATE MEDICAL CARE IF:  You have a fever.  You have headaches more than once a week.  You have sensitivity to light or sound.  You have repeated nausea and vomiting.  You have vision problems.  You have sudden, severe pain in your face or head.  You have a seizure.  You are confused.  Your sinus headaches do not get better after treatment. Many people think they have a sinus headache when they actually have migraines or tension headaches. MAKE SURE YOU:   Understand these instructions.  Will watch your condition.  Will get help right away if you are not doing well or get worse. Document Released: 07/16/2004 Document Revised: 08/31/2011 Document Reviewed: 09/06/2010 [facility] Patient Information 2014 [place], [place].  Dehydration,  Adult Dehydration is when you lose more fluids from the body than you take in. Vital organs like the kidneys, brain, and heart cannot function without a proper amount of fluids and salt. Any loss of fluids from the body can cause dehydration.  CAUSES   Vomiting.  Diarrhea.  Excessive sweating.  Excessive urine output.  Fever. SYMPTOMS  Mild dehydration  Thirst.  Dry lips.  Slightly dry mouth. Moderate dehydration  Very dry mouth.  Sunken eyes.  Skin does not bounce back quickly when lightly pinched and released.  Dark urine and decreased urine production.  Decreased tear production.  Headache. Severe dehydration  Very dry mouth.  Extreme thirst.  Rapid, weak pulse (more than 100 beats per minute at rest).  Cold hands and feet.  Not able to sweat in spite of heat and temperature.  Rapid breathing.  Blue lips.  Confusion and lethargy.  Difficulty being awakened.  Minimal urine production.  No tears. DIAGNOSIS  Your caregiver will diagnose dehydration based on your symptoms and your exam. Blood and urine tests will help confirm the diagnosis. The diagnostic evaluation should also identify the cause of dehydration. TREATMENT  Treatment of mild or moderate dehydration can often be done at home by increasing the amount of fluids that you drink. It is best to drink small amounts of fluid more often. Drinking too much at one time can make vomiting worse. Refer to the home care instructions below. Severe dehydration needs to be treated at the hospital where you will probably be given intravenous (IV) fluids that contain water and electrolytes. HOME CARE INSTRUCTIONS  Ask your caregiver about specific rehydration instructions.  Drink enough fluids to keep your urine clear or pale yellow.  Drink small amounts frequently if you have nausea and vomiting.  Eat as you normally do.  Avoid:  Foods or drinks high in sugar.  Carbonated  drinks.  Juice.  Extremely hot or cold fluids.  Drinks with caffeine.  Fatty, greasy foods.  Alcohol.  Tobacco.  Overeating.  Gelatin desserts.  Wash your hands well to avoid spreading bacteria and viruses.  Only take over-the-counter or prescription medicines for pain, discomfort, or fever as directed by your caregiver.  Ask your caregiver if you should continue all prescribed and over-the-counter medicines.  Keep all follow-up appointments with your caregiver. SEEK MEDICAL CARE IF:  You have abdominal pain and it increases or stays in one area (localizes).  You have a rash, stiff neck, or severe headache.  You are irritable, sleepy, or difficult to awaken.  You are weak, dizzy, or extremely thirsty. SEEK IMMEDIATE MEDICAL CARE IF:   You are unable to keep fluids down or you get worse despite treatment.  You have frequent episodes of vomiting or diarrhea.  You have blood or green matter (bile) in your vomit.  You have blood in your stool or your stool looks black and tarry.  You have not urinated in 6 to 8 hours, or you have only urinated a small amount of very dark urine.  You have a fever.  You faint. MAKE SURE YOU:   Understand these instructions.  Will watch your condition.  Will get help right away if you are not doing well or get worse. Document Released: 06/08/2005 Document Revised: 08/31/2011 Document Reviewed: 01/26/2011 [facility] Patient Information 2014 [place], [place].

## 2013-06-25 NOTE — ED Provider Notes (Signed)
CSN: [id number]     Arrival date & time 06/24/13  2216 History   First MD Initiated Contact with Patient 06/25/13 0247     Chief Complaint  Patient presents with  . Headache   (Consider location/radiation/quality/duration/timing/severity/associated sxs/prior Treatment) HPI Patient presents with 2 days of headache that she describes as throbbing. The headache is mostly bitemporal and facial she does complain of occipital pain. She has accompanying photophobia she is having nausea with a few episodes of vomiting. Denies any diarrhea. She has no neck stiffness. She has no focal weakness or numbness. Denies fevers or chills but admits to fatigue. Recently seen and treated for a urinary tract infection. Past Medical History  Diagnosis Date  . Sickle cell anemia   . Urinary tract infection July 2013    frequent   . Depression    Past Surgical History  Procedure Laterality Date  . No past surgeries     No family history on file. History  Substance Use Topics  . Smoking status: Never Smoker   . Smokeless tobacco: Never Used  . Alcohol Use: No   OB History   Grav Para Term Preterm Abortions TAB SAB Ect Mult Living                 Review of Systems  Constitutional: Positive for fatigue. Negative for fever and chills.  HENT: Positive for congestion and sinus pressure. Negative for sore throat.   Eyes: Positive for photophobia.  Respiratory: Negative for cough and shortness of breath.   Cardiovascular: Negative for chest pain, palpitations and leg swelling.  Gastrointestinal: Positive for nausea and vomiting. Negative for abdominal pain, diarrhea and constipation.  Genitourinary: Negative for dysuria, frequency and flank pain.  Musculoskeletal: Negative for back pain, myalgias, neck pain and neck stiffness.  Skin: Negative for rash and wound.  Neurological: Positive for headaches. Negative for dizziness, syncope, weakness, light-headedness and numbness.  All other systems reviewed and  are negative.    Allergies  Review of patient's allergies indicates no known allergies.  Home Medications   Current Outpatient Rx  Name  Route  Sig  Dispense  Refill  . ibuprofen (ADVIL,MOTRIN) 200 MG tablet   Oral   Take 400 mg by mouth daily as needed (pain).          . Norethindrone Acetate-Ethinyl Estrad-FE (LOMEDIA 24 FE) 1-20 MG-MCG(24) tablet   Oral   Take 1 tablet by mouth daily at 6 PM.          . phenazopyridine (PYRIDIUM) 200 MG tablet   Oral   Take 1 tablet (200 mg total) by mouth 3 (three) times daily as needed for pain.   5 tablet   0   . sulfamethoxazole-trimethoprim (BACTRIM DS) 800-160 MG per tablet   Oral   Take 1 tablet by mouth 2 (two) times daily. 3 day course started 06/24/13          BP 98/55  Pulse 102  Temp(Src) 99.3 F (37.4 C) (Oral)  Resp 18  Ht 5\' 2"  (1.575 m)  Wt 114 lb (51.71 kg)  BMI 20.85 kg/m2  SpO2 100%  LMP 06/15/2013 Physical Exam  Nursing note and vitals reviewed. Constitutional: She is oriented to person, place, and time. She appears well-developed and well-nourished. No distress.  HENT:  Head: Normocephalic and atraumatic.  Mouth/Throat: Oropharynx is clear and moist. No oropharyngeal exudate.  Sinus tenderness to percussion bilaterally. Patient has bilateral nasal mucosal edema.  Eyes: EOM are normal. Pupils are equal,  round, and reactive to light.  Neck: Normal range of motion. Neck supple.  No meningismus.  Cardiovascular: Normal rate and regular rhythm.   Pulmonary/Chest: Effort normal and breath sounds normal. No respiratory distress. She has no wheezes. She has no rales. She exhibits no tenderness.  Abdominal: Soft. Bowel sounds are normal. She exhibits no distension and no mass. There is no tenderness. There is no rebound and no guarding.  Musculoskeletal: Normal range of motion. She exhibits no edema and no tenderness.  Lymphadenopathy:    She has no cervical adenopathy.  Neurological: She is alert and  oriented to person, place, and time.  Patient is alert and oriented x3 with clear, goal oriented speech. Patient has 5/5 motor in all extremities. Sensation is intact to light touch. Patient has a normal gait and walks without assistance.   Skin: Skin is warm and dry. No rash noted. No erythema.  Psychiatric: She has a normal mood and affect. Her behavior is normal.    ED Course  Procedures (including critical care time) Labs Review Labs Reviewed  CBC WITH DIFFERENTIAL - Abnormal; Notable for the following:    RBC 3.66 (*)    Hemoglobin 11.0 (*)    HCT 30.8 (*)    Platelets 94 (*)    Eosinophils Relative 7 (*)    All other components within normal limits  COMPREHENSIVE METABOLIC PANEL - Abnormal; Notable for the following:    Total Bilirubin 1.3 (*)    All other components within normal limits  URINALYSIS, ROUTINE W REFLEX MICROSCOPIC - Abnormal; Notable for the following:    Color, Urine AMBER (*)    APPearance CLOUDY (*)    Ketones, ur >80 (*)    All other components within normal limits   Imaging Review No results found.  EKG Interpretation   None       MDM  Patient says she's feeling much better after medication and IV fluids. Heart rate has resolved to under 100. She continues to demonstrate no signs of meningitis. White blood cell count is normal. We'll treat for sinus headache. Return precautions have been given.    Loren Racer, MD 06/26/13 [phone]

## 2013-08-06 ENCOUNTER — Emergency Department (HOSPITAL_COMMUNITY)
Admission: EM | Admit: 2013-08-06 | Discharge: 2013-08-07 | Disposition: A | Discharge status: Home / Self Care | Payer: Medicaid Other | Attending: Emergency Medicine | Admitting: Emergency Medicine

## 2013-08-06 ENCOUNTER — Encounter (HOSPITAL_COMMUNITY): Payer: Self-pay | Admitting: Emergency Medicine

## 2013-08-06 DIAGNOSIS — Z79899 Other long term (current) drug therapy: Secondary | ICD-10-CM | POA: Insufficient documentation

## 2013-08-06 DIAGNOSIS — R000 Tachycardia, unspecified: Secondary | ICD-10-CM | POA: Insufficient documentation

## 2013-08-06 DIAGNOSIS — Z8659 Personal history of other mental and behavioral disorders: Secondary | ICD-10-CM | POA: Insufficient documentation

## 2013-08-06 DIAGNOSIS — Z87440 Personal history of urinary (tract) infections: Secondary | ICD-10-CM | POA: Insufficient documentation

## 2013-08-06 DIAGNOSIS — Z3202 Encounter for pregnancy test, result negative: Secondary | ICD-10-CM | POA: Insufficient documentation

## 2013-08-06 DIAGNOSIS — I951 Orthostatic hypotension: Secondary | ICD-10-CM | POA: Insufficient documentation

## 2013-08-06 DIAGNOSIS — D571 Sickle-cell disease without crisis: Secondary | ICD-10-CM | POA: Insufficient documentation

## 2013-08-06 LAB — URINALYSIS, ROUTINE W REFLEX MICROSCOPIC
Bilirubin Urine: NEGATIVE
Glucose, UA: NEGATIVE mg/dL
Hgb urine dipstick: NEGATIVE
KETONES UR: NEGATIVE mg/dL
LEUKOCYTES UA: NEGATIVE
NITRITE: NEGATIVE
PROTEIN: NEGATIVE mg/dL
Specific Gravity, Urine: 1.013 (ref 1.005–1.030)
UROBILINOGEN UA: 1 mg/dL (ref 0.0–1.0)
pH: 7 (ref 5.0–8.0)

## 2013-08-06 LAB — POCT PREGNANCY, URINE: Preg Test, Ur: NEGATIVE

## 2013-08-06 MED ORDER — SODIUM CHLORIDE 0.9 % IV BOLUS (SEPSIS)
1000.0000 mL | Freq: Once | INTRAVENOUS | Status: AC
  Administered 2013-08-07: 1000 mL via INTRAVENOUS

## 2013-08-06 NOTE — ED Notes (Signed)
Pt arrived to the ED with a complaint of dizziness.  Pt states he has been having dizzy spells for about to weeks.  Pt also states she has associated weakness with the symptom as well.  Pt states at time she feels as if she is going to faint. Pt is a sickle cell patient but denies any type of pain.

## 2013-08-06 NOTE — ED Provider Notes (Signed)
CSN: [id number]     Arrival date & time 08/06/13  2104 History   First MD Initiated Contact with Patient 08/06/13 2303     Chief Complaint  Patient presents with  . Dizziness     (Consider location/radiation/quality/duration/timing/severity/associated sxs/prior Treatment) HPI Patient presents with 2 weeks of lightheadedness. The dizziness is exacerbated when she gets up from a supine position. She states states at times she feels as if she is going to pass out but has had no syncope. She denies any room spinning sensation or nausea. She takes she has been drinking plenty of fluids. Denies any blood in her urine or stool. She denies any pain to the chest pain or abdominal pain. She has a history of sickle cell anemia and has been transfused in the past. She does not know what her last hemoglobin once. She denies any dysuria or urinary frequency. Denies headache, visual changes, focal weakness or numbness. Patient does admit to having fatigue over the same period of time. She denies fevers or chills. Her sinus infection has cleared. Past Medical History  Diagnosis Date  . Sickle cell anemia   . Urinary tract infection July 2013    frequent   . Depression    Past Surgical History  Procedure Laterality Date  . No past surgeries     History reviewed. No pertinent family history. History  Substance Use Topics  . Smoking status: Never Smoker   . Smokeless tobacco: Never Used  . Alcohol Use: No   OB History   Grav Para Term Preterm Abortions TAB SAB Ect Mult Living                 Review of Systems  Constitutional: Positive for fatigue. Negative for fever and chills.  HENT: Negative for congestion, sinus pressure and sore throat.   Eyes: Negative for photophobia and visual disturbance.  Respiratory: Negative for cough and shortness of breath.   Cardiovascular: Negative for chest pain, palpitations and leg swelling.  Gastrointestinal: Negative for nausea, vomiting, abdominal pain,  diarrhea and blood in stool.  Genitourinary: Negative for dysuria, frequency, hematuria and flank pain.  Musculoskeletal: Negative for back pain, myalgias, neck pain and neck stiffness.  Skin: Negative for rash and wound.  Neurological: Positive for dizziness and light-headedness. Negative for syncope, weakness, numbness and headaches.  All other systems reviewed and are negative.      Allergies  Review of patient's allergies indicates no known allergies.  Home Medications   Current Outpatient Rx  Name  Route  Sig  Dispense  Refill  . ibuprofen (ADVIL,MOTRIN) 200 MG tablet   Oral   Take 400 mg by mouth daily as needed (pain).          . Norethindrone Acetate-Ethinyl Estrad-FE (LOMEDIA 24 FE) 1-20 MG-MCG(24) tablet   Oral   Take 1 tablet by mouth daily at 6 PM.           BP 106/71  Pulse 102  Temp(Src) 98.6 F (37 C) (Oral)  Resp 18  Ht 5\' 2"  (1.575 m)  Wt 115 lb (52.164 kg)  BMI 21.03 kg/m2  SpO2 100%  LMP 07/11/2013 Physical Exam  Nursing note and vitals reviewed. Constitutional: She is oriented to person, place, and time. She appears well-developed and well-nourished. No distress.  HENT:  Head: Normocephalic and atraumatic.  Mouth/Throat: Oropharynx is clear and moist. No oropharyngeal exudate.  Eyes: EOM are normal. Pupils are equal, round, and reactive to light.  No nystagmus  Neck: Normal  range of motion. Neck supple.  No neck stiffness or posterior tenderness.  Cardiovascular: Regular rhythm.  Exam reveals no gallop and no friction rub.   No murmur heard. Mild tachycardia  Pulmonary/Chest: Effort normal and breath sounds normal. No respiratory distress. She has no wheezes. She has no rales. She exhibits no tenderness.  Abdominal: Soft. Bowel sounds are normal. She exhibits no distension and no mass. There is no tenderness. There is no rebound and no guarding.  Musculoskeletal: Normal range of motion. She exhibits no edema and no tenderness.  No calf  swelling or tenderness.  Neurological: She is alert and oriented to person, place, and time.  Patient is alert and oriented x3 with clear, goal oriented speech. Patient has 5/5 motor in all extremities. Sensation is intact to light touch. Patient has a normal gait and walks without assistance.   Skin: Skin is warm and dry. No rash noted. No erythema.  Psychiatric: She has a normal mood and affect. Her behavior is normal.    ED Course  Procedures (including critical care time) Labs Review Labs Reviewed  URINALYSIS, ROUTINE W REFLEX MICROSCOPIC  CBC WITH DIFFERENTIAL  COMPREHENSIVE METABOLIC PANEL  POCT PREGNANCY, URINE   Imaging Review No results found.  EKG Interpretation    Date/Time:  Sunday August 06 2013 23:43:12 EST Ventricular Rate:  85 PR Interval:  129 QRS Duration: 80 QT Interval:  358 QTC Calculation: 426 R Axis:   72 Text Interpretation:  Sinus rhythm Borderline T abnormalities, anterior leads Confirmed by Ranae PalmsYELVERTON  MD, Kendalynn Wideman ([id number]) on 08/07/2013 12:28:30 AM            MDM   Final diagnoses:  None   Based on patient's history and exam, dizziness is consistent with orthostasis. Will give IV fluids, check labs and EKG. Patient to be observed in emergency department.   Patient is resting comfortably. Her symptoms have improved. Her vital signs remained stable in the emergency department. She's been given 2 L of IV normal saline. I instructed her to drink plenty of fluids and change positions slowly. Return precautions have been given and the patient has voiced understanding.  Loren Raceravid Jordana Dugue, MD 08/07/13 [phone]

## 2013-08-07 LAB — CBC WITH DIFFERENTIAL/PLATELET
Basophils Absolute: 0 10*3/uL (ref 0.0–0.1)
Basophils Relative: 0 % (ref 0–1)
EOS ABS: 0.3 10*3/uL (ref 0.0–0.7)
Eosinophils Relative: 4 % (ref 0–5)
HCT: 29.8 % — ABNORMAL LOW (ref 36.0–46.0)
Hemoglobin: 11 g/dL — ABNORMAL LOW (ref 12.0–15.0)
Lymphocytes Relative: 39 % (ref 12–46)
Lymphs Abs: 2.9 10*3/uL (ref 0.7–4.0)
MCH: 30.7 pg (ref 26.0–34.0)
MCHC: 36.9 g/dL — AB (ref 30.0–36.0)
MCV: 83.2 fL (ref 78.0–100.0)
MONO ABS: 0.5 10*3/uL (ref 0.1–1.0)
Monocytes Relative: 6 % (ref 3–12)
Neutro Abs: 3.8 10*3/uL (ref 1.7–7.7)
Neutrophils Relative %: 51 % (ref 43–77)
PLATELETS: 117 10*3/uL — AB (ref 150–400)
RBC: 3.58 MIL/uL — ABNORMAL LOW (ref 3.87–5.11)
RDW: 13.6 % (ref 11.5–15.5)
WBC: 7.5 10*3/uL (ref 4.0–10.5)

## 2013-08-07 LAB — COMPREHENSIVE METABOLIC PANEL
ALT: 6 U/L (ref 0–35)
AST: 17 U/L (ref 0–37)
Albumin: 3.9 g/dL (ref 3.5–5.2)
Alkaline Phosphatase: 44 U/L (ref 39–117)
BUN: 6 mg/dL (ref 6–23)
CALCIUM: 9 mg/dL (ref 8.4–10.5)
CO2: 24 mEq/L (ref 19–32)
CREATININE: 0.59 mg/dL (ref 0.50–1.10)
Chloride: 102 mEq/L (ref 96–112)
GFR calc non Af Amer: >90 mL/min (ref >90)
GLUCOSE: 87 mg/dL (ref 70–99)
Potassium: 3.7 mEq/L (ref 3.7–5.3)
Sodium: 138 mEq/L (ref 137–147)
TOTAL PROTEIN: 7.9 g/dL (ref 6.0–8.3)
Total Bilirubin: 0.9 mg/dL (ref 0.3–1.2)

## 2013-08-07 MED ORDER — SODIUM CHLORIDE 0.9 % IV BOLUS (SEPSIS)
1000.0000 mL | Freq: Once | INTRAVENOUS | Status: AC
  Administered 2013-08-07: 1000 mL via INTRAVENOUS

## 2013-08-07 NOTE — Discharge Instructions (Signed)
Make sure you are drinking plenty of fluids. Change positions very slowly especially from a lying position. Return immediately for worsening symptoms, loss of consciousness, weakness or any other concerns.  Orthostatic Hypotension Orthostatic hypotension is a sudden fall in blood pressure. It occurs when a person goes from a sitting or lying position to a standing position. CAUSES   Loss of body fluids (dehydration).  Medicines that lower blood pressure.  Sudden changes in posture, such as sudden standing when you have been sitting or lying down.  Taking too much of your medicine. SYMPTOMS   Lightheadedness or dizziness.  Fainting or near-fainting.  A fast heart rate (tachycardia).  Weakness.  Feeling tired (fatigue). DIAGNOSIS  Your caregiver may find the cause of orthostatic hypotension through:  A history and/or physical exam.  Checking your blood pressure. Your caregiver will check your blood pressure when you are:  Lying down.  Sitting.  Standing.  Tilt table testing. In this test, you are placed on a table that goes from a lying position to a standing position. You will be strapped to the table. This test helps to monitor your blood pressure and heart rate when you are in different positions. TREATMENT   If orthostatic hypotension is caused by your medicines, your caregiver will need to adjust your dosage. Do not stop or adjust your medicine on your own.  When changing positions, make these changes slowly. This allows your body to adjust to the different position.  Compression stockings that are worn on your lower legs may be helpful.  Your caregiver may have you consume extra salt. Do not add extra salt to your diet unless directed by your caregiver.  Eat frequent, small meals. Avoid sudden standing after eating.  Avoid hot showers or excessive heat.  Your caregiver may give you fluids through the vein (intravenous).  Your caregiver may put you on medicine  to help enhance fluid retention. SEEK IMMEDIATE MEDICAL CARE IF:   You faint or have a near-fainting episode. Call your local emergency services (911 in [place]  You have or develop chest pain.  You feel sick to your stomach (nauseous) or vomit.  You have a loss of feeling or movement in your arms or legs.  You have difficulty talking, slurred speech, or you are unable to talk.  You have difficulty thinking or have confused thinking. MAKE SURE YOU:   Understand these instructions.  Will watch your condition.  Will get help right away if you are not doing well or get worse. Document Released: 05/29/2002 Document Revised: 08/31/2011 Document Reviewed: 09/21/2008 [facility] Patient Information 2014 [place] SummitExitCare, [place].

## 2013-08-07 NOTE — ED Notes (Signed)
Pt c/o intermittent x 2 weeks, presyncopal at times per pt. Denies n/v/d, denies menses, denies fever. A & O, neuro intact.

## 2013-08-21 ENCOUNTER — Ambulatory Visit: Payer: No Typology Code available for payment source | Admitting: Internal Medicine

## 2013-08-24 ENCOUNTER — Encounter: Payer: Self-pay | Admitting: Internal Medicine

## 2013-08-24 ENCOUNTER — Ambulatory Visit (INDEPENDENT_AMBULATORY_CARE_PROVIDER_SITE_OTHER): Payer: No Typology Code available for payment source | Admitting: Internal Medicine

## 2013-08-24 VITALS — BP 120/82 | HR 103 | Temp 98.0°F | Resp 20 | Ht 63.0 in | Wt 117.0 lb

## 2013-08-24 DIAGNOSIS — D5720 Sickle-cell/Hb-C disease without crisis: Secondary | ICD-10-CM | POA: Insufficient documentation

## 2013-08-24 DIAGNOSIS — R42 Dizziness and giddiness: Secondary | ICD-10-CM

## 2013-08-24 LAB — TSH: TSH: 0.917 u[IU]/mL (ref 0.350–4.500)

## 2013-08-24 NOTE — Progress Notes (Signed)
Subjective:    Patient ID: Makayla Fletcher, female    DOB: October 07, 1995, 18 y.o.   MRN: [id number]  HPI: Pt here to establish care and states that she has been having dizziness which she describes as her head swimming. It has been occurring almost daily since January and it gets worse as the day progresses unless  she lays down and then it improves. She has an unsteady gait sometimes which causes her to have to stop activities.  She denies palpitations,hearing or vision problems, weakness or any focal sensory motor deficits. However she does feel fatigued and drained as a result of the "swimmy headed feeling".  She normally gets about 10 hours of sleep/night. She normally wakes up feeling refreshed. Pt does not snore. Pt does have a history of Migraine headaches, however she headaches do not usually accompany the dizziness.  Pt endorses that she has had no pain associated with her Sickle Cell Disease. She has had minimal RBC transfusions.    Review of Systems  Constitutional: Negative.   HENT: Negative.  Negative for ear pain, hearing loss and sinus pressure.   Eyes: Negative.  Negative for visual disturbance.  Respiratory: Negative.   Cardiovascular: Negative.  Negative for palpitations.  Gastrointestinal: Negative.   Endocrine: Positive for heat intolerance.  Genitourinary: Negative.        LNMP: 08/11/2013 lasting 3 days light flow  Sexually active with one partner. Uses condoms every time. Frequency of sexual activity 1-2 x month. No oral sex.  Musculoskeletal: Negative.   Skin: Negative.   Allergic/Immunologic: Negative.   Neurological: Positive for dizziness and light-headedness. Negative for weakness and headaches.  Hematological: Negative.   Psychiatric/Behavioral: Negative.        Objective:   Physical Exam  Nursing note and vitals reviewed. Constitutional: She is oriented to person, place, and time. She appears well-developed and well-nourished.  HENT:  Head:  Normocephalic and atraumatic.  Right Ear: External ear normal.  Left Ear: External ear normal.  Mouth/Throat: Oropharynx is clear and moist.  Eyes: Conjunctivae and EOM are normal. Pupils are equal, round, and reactive to light.  Neck: Normal range of motion. Neck supple. No JVD present. No thyromegaly present.  Cardiovascular: Normal rate and regular rhythm.  Exam reveals no gallop and no friction rub.   No murmur heard. Pulmonary/Chest: Effort normal and breath sounds normal. She has no wheezes. She has no rales.  Abdominal: Soft. Bowel sounds are normal. She exhibits no distension and no mass. There is no tenderness.  Genitourinary:  Not examined  Musculoskeletal: Normal range of motion.  Neurological: She is alert and oriented to person, place, and time. No cranial nerve deficit.  Skin: Skin is warm and dry.  Psychiatric: She has a normal mood and affect. Her behavior is normal. Judgment and thought content normal.          Assessment & Plan:  1. Hb [place]: Pt has a usual Hb of 11. She has had no pains associated with [place] disease. She occasionally uses ibuprofen for any joint pains. She takes folic acid 1 mg daily.  -Last vision examination more than 3 years ago. Refer for vision examination.  - Check Electrophoresis. CBC reviewed from last moth and trend shows baseline Hb to be around 11.   2. Disequilibrium: Pt has no discernable neurological deficits. 12 lead EKG shows NSR and orthostatic vital signs are negative for orthostatic changes. Will discuss tranascranial doppler and CT with Neurologist. The degree of disequilibrium is causing  a disturbance to her daily activities. (EKG shows NSR)  3. Immunizations: Discussed Gardasil with patient and she wants to hold on this immunization until she has spoken with her mother.  Labs:TSH, Hemoglobinopathy  RTC: 1 month

## 2013-08-24 NOTE — Progress Notes (Signed)
Rt Arm lying down 120/82,Left Arm Standing 120/80 Sitting 118/79

## 2013-08-28 LAB — HEMOGLOBINOPATHY EVALUATION
HGB A2 QUANT: 3.2 % (ref 2.2–3.2)
HGB F QUANT: 2.8 % — AB (ref 0.0–2.0)
Hemoglobin Other: 43.1 % — ABNORMAL HIGH
Hgb A: 0 % — ABNORMAL LOW (ref 96.8–97.8)
Hgb S Quant: 50.9 % — ABNORMAL HIGH

## 2013-08-31 ENCOUNTER — Telehealth: Payer: Self-pay

## 2013-08-31 LAB — HGB ELECTROPHORESIS REFLEXED REPORT
HEMOGLOBIN A - HGBRFX: 0 % — AB (ref >96.0)
Hemoglobin A2 - HGBRFX: 3.8 % — ABNORMAL HIGH (ref 1.8–3.5)
Hemoglobin Elect C: 44.1 % — ABNORMAL HIGH
Hemoglobin F - HGBRFX: 1.4 % (ref <2.0)
Hemoglobin S - HGBRFX: 50.7 % — ABNORMAL HIGH
SICKLE SOLUBILITY TEST - HGBRFX: POSITIVE — AB

## 2013-09-01 ENCOUNTER — Other Ambulatory Visit: Payer: Self-pay | Admitting: Internal Medicine

## 2013-09-01 ENCOUNTER — Telehealth: Payer: Self-pay

## 2013-09-01 DIAGNOSIS — R42 Dizziness and giddiness: Secondary | ICD-10-CM

## 2013-09-01 DIAGNOSIS — D571 Sickle-cell disease without crisis: Secondary | ICD-10-CM

## 2013-09-01 NOTE — Telephone Encounter (Signed)
Pt returned call to office and msg was given concerning her up comming consult w/ Neuro Dr. Pearlean BrownieSethi.Pt was told that [facility] office would contact her w/ date & time.

## 2013-09-01 NOTE — Progress Notes (Signed)
Spoke with Dr. Pearlean BrownieSethi and discussed patient's symptoms. He recommends referring to Neurology and he will determine most appropriate tests.

## 2013-09-01 NOTE — Telephone Encounter (Signed)
Pt returned call to office and msg was given concerning her up comming consult w/ Neuro Dr. Sethi.Pt was told that [facility] would contact her w/ date & time.  

## 2013-09-11 ENCOUNTER — Encounter: Payer: Self-pay | Admitting: Internal Medicine

## 2013-09-21 ENCOUNTER — Other Ambulatory Visit (HOSPITAL_COMMUNITY)
Admission: RE | Admit: 2013-09-21 | Discharge: 2013-09-21 | Disposition: A | Discharge status: Home / Self Care | Payer: Medicaid Other | Source: Ambulatory Visit | Attending: Family Medicine | Admitting: Family Medicine

## 2013-09-21 ENCOUNTER — Emergency Department (INDEPENDENT_AMBULATORY_CARE_PROVIDER_SITE_OTHER)
Admission: EM | Admit: 2013-09-21 | Discharge: 2013-09-21 | Disposition: A | Discharge status: Home / Self Care | Payer: Medicaid Other | Source: Home / Self Care | Attending: Family Medicine | Admitting: Family Medicine

## 2013-09-21 ENCOUNTER — Encounter (HOSPITAL_COMMUNITY): Payer: Self-pay | Admitting: Emergency Medicine

## 2013-09-21 DIAGNOSIS — Z113 Encounter for screening for infections with a predominantly sexual mode of transmission: Secondary | ICD-10-CM | POA: Insufficient documentation

## 2013-09-21 DIAGNOSIS — B3731 Acute candidiasis of vulva and vagina: Secondary | ICD-10-CM

## 2013-09-21 DIAGNOSIS — N760 Acute vaginitis: Secondary | ICD-10-CM | POA: Insufficient documentation

## 2013-09-21 DIAGNOSIS — B373 Candidiasis of vulva and vagina: Secondary | ICD-10-CM

## 2013-09-21 LAB — POCT URINALYSIS DIP (DEVICE)
BILIRUBIN URINE: NEGATIVE
Glucose, UA: NEGATIVE mg/dL
Hgb urine dipstick: NEGATIVE
Ketones, ur: NEGATIVE mg/dL
LEUKOCYTES UA: NEGATIVE
Nitrite: NEGATIVE
PH: 7 (ref 5.0–8.0)
Protein, ur: NEGATIVE mg/dL
Specific Gravity, Urine: 1.02 (ref 1.005–1.030)
UROBILINOGEN UA: 0.2 mg/dL (ref 0.0–1.0)

## 2013-09-21 LAB — POCT PREGNANCY, URINE: Preg Test, Ur: NEGATIVE

## 2013-09-21 MED ORDER — FLUCONAZOLE 150 MG PO TABS: 150.0000 mg | ORAL_TABLET | Freq: Once | ORAL | Status: DC

## 2013-09-21 NOTE — ED Provider Notes (Signed)
Makayla Fletcher is a 18 y.o. female who presents to Urgent Care today for one day of vaginal irritation and with white discharge associated with urinary frequency and urgency. No fevers chills nausea vomiting or diarrhea. No abdominal pain. No treatments tried yet. Patient feels well otherwise.   Past Medical History  Diagnosis Date  . Sickle cell anemia   . Urinary tract infection July 2013    frequent   . Depression    History  Substance Use Topics  . Smoking status: Never Smoker   . Smokeless tobacco: Never Used  . Alcohol Use: No   ROS as above Medications: No current facility-administered medications for this encounter.   Current Outpatient Prescriptions  Medication Sig Dispense Refill  . Norethindrone Acetate-Ethinyl Estrad-FE (LOMEDIA 24 FE) 1-20 MG-MCG(24) tablet Take 1 tablet by mouth daily at 6 PM.       . fluconazole (DIFLUCAN) 150 MG tablet Take 1 tablet (150 mg total) by mouth once.  1 tablet  1  . folic acid (FOLVITE) 1 MG tablet Take 1 mg by mouth daily.      Marland Kitchen. ibuprofen (ADVIL,MOTRIN) 200 MG tablet Take 400 mg by mouth daily as needed (pain).         Exam:  BP 115/78  Pulse 98  Temp(Src) 98.3 F (36.8 C) (Oral)  Resp 16  SpO2 100%  LMP 09/13/2013 Gen: Well NAD HEENT: EOMI,  MMM Lungs: Normal work of breathing. CTABL Heart: RRR no MRG Abd: NABS, Soft. NT, ND Exts: Brisk capillary refill, warm and well perfused.  GYN: Normal external genitalia. Vaginal canal with thick clumpy white discharge. Normal-appearing cervix  Results for orders placed during the hospital encounter of 09/21/13 (from the past 24 hour(s))  POCT URINALYSIS DIP (DEVICE)     Status: None   Collection Time    09/21/13  7:47 PM      Result Value Ref Range   Glucose, UA NEGATIVE  NEGATIVE mg/dL   Bilirubin Urine NEGATIVE  NEGATIVE   Ketones, ur NEGATIVE  NEGATIVE mg/dL   Specific Gravity, Urine 1.020  1.005 - 1.030   Hgb urine dipstick NEGATIVE  NEGATIVE   pH 7.0  5.0 - 8.0   Protein, ur NEGATIVE  NEGATIVE mg/dL   Urobilinogen, UA 0.2  0.0 - 1.0 mg/dL   Nitrite NEGATIVE  NEGATIVE   Leukocytes, UA NEGATIVE  NEGATIVE  POCT PREGNANCY, URINE     Status: None   Collection Time    09/21/13  7:50 PM      Result Value Ref Range   Preg Test, Ur NEGATIVE  NEGATIVE   No results found.  Assessment and Plan: 18 y.o. female with yeast vaginitis. Urine culture pending. Treatment with fluconazole. Vaginal cytology pending  Discussed warning signs or symptoms. Please see discharge instructions. Patient expresses understanding.    Rodolph BongEvan S Corey, MD 09/21/13 2004

## 2013-09-21 NOTE — ED Notes (Signed)
C/o urinary frequency and vaginal irritation since yesterday.  White discharge with no odor.  No otc meds tried.

## 2013-09-21 NOTE — Discharge Instructions (Signed)
Thank you for coming in today. Take fluconazole I will call you if lab results are abnormal If your belly pain worsens, or you have high fever, bad vomiting, blood in your stool or black tarry stool go to the Emergency Room.    Candidal Vulvovaginitis Candidal vulvovaginitis is an infection of the vagina and vulva. The vulva is the skin around the opening of the vagina. This may cause itching and discomfort in and around the vagina.  HOME CARE  Only take medicine as told by your doctor.  Do not have sex (intercourse) until the infection is healed or as told by your doctor.  Practice safe sex.  Tell your sex partner about your infection.  Do not douche or use tampons.  Wear cotton underwear. Do not wear tight pants or panty hose.  Eat yogurt. This may help treat and prevent yeast infections. GET HELP RIGHT AWAY IF:   You have a fever.  Your problems get worse during treatment or do not get better in 3 days.  You have discomfort, irritation, or itching in your vagina or vulva area.  You have pain after sex.  You start to get belly (abdominal) pain. MAKE SURE YOU:  Understand these instructions.  Will watch your condition.  Will get help right away if you are not doing well or get worse. Document Released: 09/04/2008 Document Revised: 08/31/2011 Document Reviewed: 09/04/2008 [facility] Patient Information 2014 [employer], [place].

## 2013-09-22 LAB — CERVICOVAGINAL ANCILLARY ONLY
CHLAMYDIA, DNA PROBE: NEGATIVE
Neisseria Gonorrhea: NEGATIVE
WET PREP (BD AFFIRM): NEGATIVE
WET PREP (BD AFFIRM): NEGATIVE
Wet Prep (BD Affirm): NEGATIVE

## 2013-09-23 LAB — URINE CULTURE
Colony Count: NO GROWTH
Culture: NO GROWTH
Special Requests: NORMAL

## 2013-09-24 ENCOUNTER — Emergency Department (INDEPENDENT_AMBULATORY_CARE_PROVIDER_SITE_OTHER)
Admission: EM | Admit: 2013-09-24 | Discharge: 2013-09-24 | Disposition: A | Discharge status: Home / Self Care | Payer: Medicaid Other | Source: Home / Self Care | Attending: Emergency Medicine | Admitting: Emergency Medicine

## 2013-09-24 ENCOUNTER — Encounter (HOSPITAL_COMMUNITY): Payer: Self-pay | Admitting: Emergency Medicine

## 2013-09-24 DIAGNOSIS — N390 Urinary tract infection, site not specified: Secondary | ICD-10-CM

## 2013-09-24 LAB — POCT URINALYSIS DIP (DEVICE)
GLUCOSE, UA: NEGATIVE mg/dL
Hgb urine dipstick: NEGATIVE
Ketones, ur: NEGATIVE mg/dL
NITRITE: NEGATIVE
Protein, ur: NEGATIVE mg/dL
Specific Gravity, Urine: 1.02 (ref 1.005–1.030)
UROBILINOGEN UA: 1 mg/dL (ref 0.0–1.0)
pH: 7 (ref 5.0–8.0)

## 2013-09-24 LAB — POCT PREGNANCY, URINE: PREG TEST UR: NEGATIVE

## 2013-09-24 MED ORDER — NITROFURANTOIN MONOHYD MACRO 100 MG PO CAPS: 100.0000 mg | ORAL_CAPSULE | Freq: Two times a day (BID) | ORAL | Status: DC

## 2013-09-24 NOTE — ED Provider Notes (Signed)
Medical screening examination/treatment/procedure(s) were performed by non-physician practitioner and as supervising physician I was immediately available for consultation/collaboration.  Drae Mitzel, M.D.  Aylee Littrell C Rowe Warman, MD 09/24/13 2118 

## 2013-09-24 NOTE — ED Notes (Signed)
Pt   Reports     Of urinary       Frequency       And  Burning       On  Urination           X      3  Days  Has  Had  uti in  Past

## 2013-09-24 NOTE — ED Provider Notes (Signed)
CSN: [id number]     Arrival date & time 09/24/13  [id number] History   First MD Initiated Contact with Patient 09/24/13 [phone]     Chief Complaint  Patient presents with  . Urinary Frequency   (Consider location/radiation/quality/duration/timing/severity/associated sxs/prior Treatment) HPI Comments: Denies fever, flank pain or abdominal pain. No hematuria. No genital lesions. No polydipsia.   Patient is a 18 y.o. female presenting with frequency. The history is provided by the patient.  Urinary Frequency This is a new problem. The current episode started yesterday. The problem occurs hourly. The problem has been gradually worsening.    Past Medical History  Diagnosis Date  . Sickle cell anemia   . Urinary tract infection July 2013    frequent   . Depression    Past Surgical History  Procedure Laterality Date  . No past surgeries     History reviewed. No pertinent family history. History  Substance Use Topics  . Smoking status: Never Smoker   . Smokeless tobacco: Never Used  . Alcohol Use: No   OB History   Grav Para Term Preterm Abortions TAB SAB Ect Mult Living                 Review of Systems  Genitourinary: Positive for frequency.  All other systems reviewed and are negative.    Allergies  Review of patient's allergies indicates no known allergies.  Home Medications   Current Outpatient Rx  Name  Route  Sig  Dispense  Refill  . fluconazole (DIFLUCAN) 150 MG tablet   Oral   Take 1 tablet (150 mg total) by mouth once.   1 tablet   1   . folic acid (FOLVITE) 1 MG tablet   Oral   Take 1 mg by mouth daily.         Marland Kitchen. ibuprofen (ADVIL,MOTRIN) 200 MG tablet   Oral   Take 400 mg by mouth daily as needed (pain).          . nitrofurantoin, macrocrystal-monohydrate, (MACROBID) 100 MG capsule   Oral   Take 1 capsule (100 mg total) by mouth 2 (two) times daily.   10 capsule   0   . Norethindrone Acetate-Ethinyl Estrad-FE (LOMEDIA 24 FE) 1-20 MG-MCG(24) tablet   Oral   Take 1 tablet by mouth daily at 6 PM.           BP 111/74  Pulse 91  Temp(Src) 98 F (36.7 C) (Oral)  Resp 18  SpO2 100%  LMP 09/13/2013 Physical Exam  Nursing note and vitals reviewed. Constitutional: She is oriented to person, place, and time. She appears well-developed and well-nourished. No distress.  HENT:  Head: Normocephalic and atraumatic.  Eyes: Conjunctivae are normal. No scleral icterus.  Cardiovascular: Normal rate, regular rhythm and normal heart sounds.   Pulmonary/Chest: Effort normal and breath sounds normal. No respiratory distress. She has no wheezes.  Abdominal: Soft. Bowel sounds are normal. She exhibits no distension. There is no tenderness. There is no CVA tenderness.  Musculoskeletal: Normal range of motion.  Neurological: She is alert and oriented to person, place, and time.  Skin: Skin is warm and dry.  Psychiatric: She has a normal mood and affect.    ED Course  Procedures (including critical care time) Labs Review Labs Reviewed  POCT URINALYSIS DIP (DEVICE) - Abnormal; Notable for the following:    Bilirubin Urine SMALL (*)    Leukocytes, UA MODERATE (*)    All other components within normal limits  URINE CULTURE  POCT PREGNANCY, URINE   Imaging Review No results found.   MDM   1. UTI (lower urinary tract infection)   Increase hydration. Macrobid as prescribed. Specimen sent for C&S. Advised to see PCP if no improvement.   Jess Barters [place], [place] 09/24/13 1023

## 2013-09-26 LAB — URINE CULTURE: Colony Count: >=100000

## 2013-09-26 NOTE — ED Notes (Signed)
Urine culture: >100,000 colonies Klebsiella Pneumoniae. Pt. treated with Macrobid- Intermediate.  Message sent to [facility] PA. Vassie MoselleYork, Dariush Mcnellis M 09/26/2013

## 2013-09-27 ENCOUNTER — Telehealth (HOSPITAL_COMMUNITY): Payer: Self-pay | Admitting: *Deleted

## 2013-09-27 NOTE — ED Notes (Addendum)
Makayla Fletcher wrote to call pt. and check for clinical improvement.  If not better to change to Septra DS 1 po BID x 7 days #14.  I called pt. and left a message to call. Call 1. Makayla Fletcher 09/27/2013 Pt. called back.  Pt. verified x 2 and given results.  Pt. said she is better. I told her to call back if any change or worsening of symptoms. Pt. voiced understanding. 09/27/2013

## 2013-11-14 ENCOUNTER — Telehealth (HOSPITAL_COMMUNITY): Payer: Self-pay | Admitting: Hematology

## 2013-11-14 NOTE — Telephone Encounter (Signed)
Spoke with Dr. Ashley Royalty.  Advised patient to take her 600mg  Ibuprofen as prescribed, and come to office for appointment tomorrow.  I did explain that if the pain worsened or she develop shortness of breath, difficulty breathing, if the pain started to radiate down her arm, neck or jaw, of if she developed pain between her shoulder blades, she needed to go to the emergency room.  Put call through to [facility] to schedule appointment for tomorrow.  Patient verbalized understanding.

## 2013-11-14 NOTE — Telephone Encounter (Signed)
Patient C/O chest pain that starts from her neck and radiates down to her bra line. Patient denies pain in arm, neck or jaw.  Patient denies shortness of breath, or palpitations.  Patient rates the pain 6/10 on pain scale.  Patient states this has been going on on/off for 1 week.  I advised I would notify the physician and give her a call back.  Patient verbalizes understanding.

## 2013-11-15 ENCOUNTER — Ambulatory Visit: Payer: Medicaid Other | Admitting: Family Medicine

## 2013-11-21 ENCOUNTER — Ambulatory Visit: Payer: Medicaid Other | Admitting: Family Medicine

## 2013-11-21 ENCOUNTER — Emergency Department (HOSPITAL_COMMUNITY): Payer: Medicaid Other

## 2013-11-21 ENCOUNTER — Encounter (HOSPITAL_COMMUNITY): Payer: Self-pay | Admitting: Emergency Medicine

## 2013-11-21 ENCOUNTER — Inpatient Hospital Stay (HOSPITAL_COMMUNITY)
Admission: EM | Admit: 2013-11-21 | Discharge: 2013-11-22 | DRG: 812 | Disposition: A | Discharge status: Home / Self Care | Payer: Medicaid Other | Attending: Internal Medicine | Admitting: Internal Medicine

## 2013-11-21 DIAGNOSIS — D5700 Hb-SS disease with crisis, unspecified: Principal | ICD-10-CM

## 2013-11-21 DIAGNOSIS — R000 Tachycardia, unspecified: Secondary | ICD-10-CM

## 2013-11-21 DIAGNOSIS — R079 Chest pain, unspecified: Secondary | ICD-10-CM | POA: Diagnosis present

## 2013-11-21 DIAGNOSIS — I498 Other specified cardiac arrhythmias: Secondary | ICD-10-CM

## 2013-11-21 DIAGNOSIS — D5701 Hb-SS disease with acute chest syndrome: Secondary | ICD-10-CM | POA: Diagnosis present

## 2013-11-21 LAB — COMPREHENSIVE METABOLIC PANEL
ALT: 5 U/L (ref 0–35)
AST: 15 U/L (ref 0–37)
Albumin: 3.8 g/dL (ref 3.5–5.2)
Alkaline Phosphatase: 50 U/L (ref 39–117)
BILIRUBIN TOTAL: 0.9 mg/dL (ref 0.3–1.2)
BUN: 3 mg/dL — ABNORMAL LOW (ref 6–23)
CALCIUM: 9.2 mg/dL (ref 8.4–10.5)
CO2: 23 meq/L (ref 19–32)
CREATININE: 0.58 mg/dL (ref 0.50–1.10)
Chloride: 103 mEq/L (ref 96–112)
GFR calc Af Amer: >90 mL/min (ref >90)
Glucose, Bld: 88 mg/dL (ref 70–99)
Potassium: 3.6 mEq/L — ABNORMAL LOW (ref 3.7–5.3)
Sodium: 138 mEq/L (ref 137–147)
Total Protein: 8.2 g/dL (ref 6.0–8.3)

## 2013-11-21 LAB — CBC WITH DIFFERENTIAL/PLATELET
Basophils Absolute: 0 10*3/uL (ref 0.0–0.1)
Basophils Relative: 0 % (ref 0–1)
EOS PCT: 1 % (ref 0–5)
Eosinophils Absolute: 0.1 10*3/uL (ref 0.0–0.7)
HEMATOCRIT: 30.3 % — AB (ref 36.0–46.0)
HEMOGLOBIN: 11.2 g/dL — AB (ref 12.0–15.0)
LYMPHS PCT: 17 % (ref 12–46)
Lymphs Abs: 1.2 10*3/uL (ref 0.7–4.0)
MCH: 30.5 pg (ref 26.0–34.0)
MCHC: 37 g/dL — ABNORMAL HIGH (ref 30.0–36.0)
MCV: 82.6 fL (ref 78.0–100.0)
MONO ABS: 0.4 10*3/uL (ref 0.1–1.0)
Monocytes Relative: 6 % (ref 3–12)
Neutro Abs: 5.4 10*3/uL (ref 1.7–7.7)
Neutrophils Relative %: 77 % (ref 43–77)
Platelets: 135 10*3/uL — ABNORMAL LOW (ref 150–400)
RBC: 3.67 MIL/uL — ABNORMAL LOW (ref 3.87–5.11)
RDW: 13.4 % (ref 11.5–15.5)
WBC: 7.1 10*3/uL (ref 4.0–10.5)

## 2013-11-21 LAB — RETICULOCYTES
RBC.: 3.67 MIL/uL — AB (ref 3.87–5.11)
Retic Count, Absolute: 117.4 10*3/uL (ref 19.0–186.0)
Retic Ct Pct: 3.2 % — ABNORMAL HIGH (ref 0.4–3.1)

## 2013-11-21 LAB — POC URINE PREG, ED: Preg Test, Ur: NEGATIVE

## 2013-11-21 MED ORDER — DEXTROSE 5 % IV SOLN
1.0000 g | INTRAVENOUS | Status: DC
  Administered 2013-11-22: 1 g via INTRAVENOUS
  Filled 2013-11-21 (×2): qty 10

## 2013-11-21 MED ORDER — SODIUM CHLORIDE 0.9 % IV SOLN
INTRAVENOUS | Status: DC
  Administered 2013-11-22: via INTRAVENOUS

## 2013-11-21 MED ORDER — KETOROLAC TROMETHAMINE 15 MG/ML IJ SOLN
15.0000 mg | Freq: Four times a day (QID) | INTRAMUSCULAR | Status: DC
  Administered 2013-11-22 (×2): 15 mg via INTRAVENOUS
  Filled 2013-11-21 (×6): qty 1

## 2013-11-21 MED ORDER — KETOROLAC TROMETHAMINE 15 MG/ML IJ SOLN
15.0000 mg | Freq: Once | INTRAMUSCULAR | Status: AC
  Administered 2013-11-21: 15 mg via INTRAVENOUS
  Filled 2013-11-21: qty 1

## 2013-11-21 MED ORDER — SENNOSIDES-DOCUSATE SODIUM 8.6-50 MG PO TABS
1.0000 | ORAL_TABLET | Freq: Every evening | ORAL | Status: DC | PRN
Start: 2013-11-21 — End: 2013-11-22
  Filled 2013-11-21: qty 1

## 2013-11-21 MED ORDER — DEXTROSE 5 % IV SOLN
500.0000 mg | INTRAVENOUS | Status: DC
  Administered 2013-11-22: 500 mg via INTRAVENOUS
  Filled 2013-11-21 (×2): qty 500

## 2013-11-21 MED ORDER — IOHEXOL 350 MG/ML SOLN
100.0000 mL | Freq: Once | INTRAVENOUS | Status: AC | PRN
  Administered 2013-11-21: 100 mL via INTRAVENOUS

## 2013-11-21 MED ORDER — HYDROMORPHONE HCL PF 1 MG/ML IJ SOLN
0.5000 mg | INTRAMUSCULAR | Status: DC
  Administered 2013-11-22 (×2): 0.5 mg via INTRAVENOUS
  Filled 2013-11-21 (×3): qty 1

## 2013-11-21 MED ORDER — ONDANSETRON HCL 4 MG/2ML IJ SOLN
4.0000 mg | INTRAMUSCULAR | Status: DC | PRN
  Administered 2013-11-22: 4 mg via INTRAVENOUS
  Filled 2013-11-21: qty 2

## 2013-11-21 MED ORDER — FOLIC ACID 1 MG PO TABS
1.0000 mg | ORAL_TABLET | Freq: Every day | ORAL | Status: DC
  Administered 2013-11-22: 1 mg via ORAL
  Filled 2013-11-21: qty 1

## 2013-11-21 MED ORDER — ONDANSETRON HCL 4 MG PO TABS: 4.0000 mg | ORAL_TABLET | ORAL | Status: DC | PRN

## 2013-11-21 MED ORDER — HEPARIN SODIUM (PORCINE) 5000 UNIT/ML IJ SOLN
5000.0000 [IU] | Freq: Three times a day (TID) | INTRAMUSCULAR | Status: DC
  Administered 2013-11-22 (×2): 5000 [IU] via SUBCUTANEOUS
  Filled 2013-11-21 (×4): qty 1

## 2013-11-21 NOTE — ED Provider Notes (Signed)
Patient presented to the ER with chest pain. Patient reports that she has been experiencing intermittent episodes of pain in her chest associated with shortness of breath for the last few days, up to approximately a week. She does have a history of sickle cell disease. She has infrequent exacerbations, usually in her arms and legs. She has developed a cough earlier today, no fever.  Face to face Exam: HEENT - PERRLA Lungs - CTAB Heart - RRR, no M/R/G Abd - S/NT/ND Neuro - alert, oriented x3  Plan: Administration of IV fluids and analgesia for possible acute sickle cell crisis. Workup for acute chest. Patient has normal baseline hemoglobin without evidence of significant hemolysis. Appropriate reticulocyte count. Chest x-ray, however, shows diffuse pneumonitis. No quarter CT angiography to rule out infarction and further define chest findings.  Gilda Crease, MD 11/21/13 [phone]

## 2013-11-21 NOTE — ED Provider Notes (Signed)
CSN: [id number]     Arrival date & time 11/21/13  1236 History   None    Chief Complaint  Patient presents with  . Chest Pain  . Sickle Cell Pain Crisis   HPI Comments: Patient is a 18 y.o. Female who presents to the [facility] ED with a 1 week history of chest pain.  Patient states that she has had gradual onset of chest pain that she states is below her breasts bilaterally with no radiation of the pain.  Pain is described as pressure and is rated a 7/10.  She states that she has had some relief of her chest pain with 600 mg ibuprofen which she has been taking twice a day.  Patient states that she has had chest pain before with her sickle cell crises, and this pain feels similar.  She states her chest pain is associated with one day of cough, and dysnea on exertion.  She denies fevers, congestion, sore throat, pleuritic chest pain, sputum production, nausea, vomiting, swelling in the legs, PND, orthopnea, or changes in bowel or bladder habits.    Patient is a 18 y.o. female presenting with chest pain and sickle cell pain.  Chest Pain Associated symptoms: cough and shortness of breath   Associated symptoms: no abdominal pain, no fatigue, no fever, no nausea, no palpitations and not vomiting   Sickle Cell Pain Crisis Associated symptoms: chest pain, cough and shortness of breath   Associated symptoms: no congestion, no fatigue, no fever, no nausea, no sore throat and no vomiting     Past Medical History  Diagnosis Date  . Sickle cell anemia   . Urinary tract infection July 2013    frequent   . Depression    Past Surgical History  Procedure Laterality Date  . No past surgeries     History reviewed. No pertinent family history. History  Substance Use Topics  . Smoking status: Never Smoker   . Smokeless tobacco: Never Used  . Alcohol Use: No   OB History   Grav Para Term Preterm Abortions TAB SAB Ect Mult Living                 Review of Systems  Constitutional: Negative for fever,  chills and fatigue.  HENT: Negative for congestion, postnasal drip, rhinorrhea and sore throat.   Respiratory: Positive for cough and shortness of breath. Negative for chest tightness.   Cardiovascular: Positive for chest pain. Negative for palpitations and leg swelling.  Gastrointestinal: Negative for nausea, vomiting, abdominal pain, diarrhea and constipation.  Genitourinary: Negative for dysuria, urgency, frequency and hematuria.  All other systems reviewed and are negative.     Allergies  Review of patient's allergies indicates no known allergies.  Home Medications   Prior to Admission medications   Medication Sig Start Date End Date Taking? Authorizing Provider  ibuprofen (ADVIL,MOTRIN) 200 MG tablet Take 400 mg by mouth daily as needed (pain).    Yes Historical Provider, MD  Norethindrone Acetate-Ethinyl Estrad-FE (LOMEDIA 24 FE) 1-20 MG-MCG(24) tablet Take 1 tablet by mouth daily at 6 PM.    Yes Historical Provider, MD   BP 113/73  Pulse 107  Resp 18  SpO2 100%  LMP 11/08/2013 Physical Exam  Nursing note and vitals reviewed. Constitutional: She is oriented to person, place, and time. She appears well-developed and well-nourished. No distress.  HENT:  Head: Normocephalic and atraumatic.  Mouth/Throat: Oropharynx is clear and moist. No oropharyngeal exudate.  Eyes: Conjunctivae and EOM are normal. Pupils  are equal, round, and reactive to light. No scleral icterus.  Neck: Normal range of motion. Neck supple. No JVD present. No thyromegaly present.  Cardiovascular: Normal rate, regular rhythm, normal heart sounds and intact distal pulses.  Exam reveals no gallop and no friction rub.   No murmur heard. Pulmonary/Chest: Effort normal and breath sounds normal. No respiratory distress. She has no wheezes. She has no rales. She exhibits tenderness.  Pain reproducible with palpation  Abdominal: Soft. Bowel sounds are normal. She exhibits no distension and no mass. There is no  tenderness. There is no rebound and no guarding.  Musculoskeletal: Normal range of motion.  Lymphadenopathy:    She has no cervical adenopathy.  Neurological: She is alert and oriented to person, place, and time. No cranial nerve deficit.  Skin: Skin is warm and dry. She is not diaphoretic.  Psychiatric: She has a normal mood and affect. Her behavior is normal. Judgment and thought content normal.    ED Course  Procedures Labs Review Labs Reviewed  CBC WITH DIFFERENTIAL - Abnormal; Notable for the following:    RBC 3.67 (*)    Hemoglobin 11.2 (*)    HCT 30.3 (*)    MCHC 37.0 (*)    Platelets 135 (*)    All other components within normal limits  COMPREHENSIVE METABOLIC PANEL - Abnormal; Notable for the following:    Potassium 3.6 (*)    BUN 3 (*)    All other components within normal limits  RETICULOCYTES - Abnormal; Notable for the following:    Retic Ct Pct 3.2 (*)    RBC. 3.67 (*)    All other components within normal limits  POC URINE PREG, ED    Imaging Review Dg Chest 2 View  11/21/2013   CLINICAL DATA:  Chest pain.  EXAM: CHEST  2 VIEW  COMPARISON:  Chest x-ray 06/20/2012.  FINDINGS: Previously identified PICC line has been removed. Mediastinum and hilar structures normal. Diffuse bilateral interstitial prominence noted consistent with pneumonitis. Stable cardiomegaly. Normal pulmonary vascularity. No pleural effusion or pneumothorax. No acute bony abnormality .  IMPRESSION: 1. Diffuse bilateral pulmonary interstitial prominence suggesting interstitial pneumonitis. 2. Stable cardiomegaly, no pulmonary venous congestion.   Electronically Signed   By: Maisie Fus  Register   On: 11/21/2013 16:45   Ct Angio Chest Pe W/cm &/or Wo Cm  11/21/2013   CLINICAL DATA:  Chest pain.  Short of breath.  EXAM: CT ANGIOGRAPHY CHEST WITH CONTRAST  TECHNIQUE: Multidetector CT imaging of the chest was performed using the standard protocol during bolus administration of intravenous contrast.  Multiplanar CT image reconstructions and MIPs were obtained to evaluate the vascular anatomy.  CONTRAST:  OMNIPAQUE IOHEXOL 350 MG/ML SOLN  COMPARISON:  06/17/2012  FINDINGS: There are no filling defects in the pulmonary arterial tree suggest acute pulmonary thromboembolism.  There is residual thymus.  No abnormal mediastinal adenopathy.  No pneumothorax.  No pleural effusion.  There is airspace disease at the left base with a peripheral distribution. It is wedge-shaped.  No acute bony deformity. Slight dextroscoliosis in the thoracic spine.  Review of the MIP images confirms the above findings.  IMPRESSION: No evidence of acute pulmonary thromboembolism.  Left lower lobe airspace disease. Follow-up by radiographs until resolution is recommended. In retrospect, it is visualized on the radiograph performed earlier today.   Electronically Signed   By: Maryclare Bean M.D.   On: 11/21/2013 19:20     EKG Interpretation   Date/Time:  Tuesday November 21 2013  12:40:05 EDT Ventricular Rate:  124 PR Interval:    QRS Duration: 66 QT Interval:  418 QTC Calculation: 600 R Axis:   87 Text Interpretation:  Sinus tachycardia T wave abnormality, consider  lateral ischemia Abnormal ECG SINCE LAST TRACING HEART RATE HAS INCREASED  NOW SEEN Nonspecific ST abnormality New since previous tracing Confirmed  by Ethelda ChickJACUBOWITZ  MD, SAM [phone]([id number]) on 11/21/2013 3:35:00 PM      MDM   Final diagnoses:  Chest pain   [facility] chest pain x 1 week.  Labwork performed here in the ED is unremarkable and EKG shows non-specific t wave changes in the inferior leads as compared to her previous EKGs.  CXR shows increased interstitial markings with a radiology read of possible interstitial pneumonitis.  Based on chest pain and chest markings we will CT angiogram the chest which ruled out PE and infarct.  Hospitalists were consulted and [facility] to [facility] triad  service.   Clydie Braunourtney A Forucci, PA-C 11/21/13 2231

## 2013-11-21 NOTE — H&P (Signed)
[facility] History and Physical  Patient: Makayla Fletcher  NFA:[id number]  DOB: 1995/10/07  DOS: the patient was seen and examined on 11/21/2013 PCP: Pcp Not In System  Chief Complaint: Chest pain  HPI: Makayla Fletcher is a 18 y.o. female with Past medical HbSC disease, anxiety. The patient presented with complaints of chest pain that has been ongoing since last one week. She has a history of sickle cell disease. She initially called [facility] cell [facility] and was recommended to continue taking pain medications. Today the pain was significantly worse and was associated with shortness of breath and cough and therefore she decided to come to the hospital. She denies any sputum production. She had chest pain which is located substernally as well as on both side of her lower chest, it gets worse with cough as well as deep breaths. She also feels like pressure. She complains of feeling tired and short of breath on walking around. She denies any recent travel or sick contact. She denies being on hydroxyurea in her life.  The patient is coming from home. And at her baseline independent for most of her ADL.  Review of Systems: as mentioned in the history of present illness.  A Comprehensive review of the other systems is negative.  Past Medical History  Diagnosis Date  . Sickle cell anemia   . Urinary tract infection July 2013    frequent   . Depression    Past Surgical History  Procedure Laterality Date  . No past surgeries     Social History:  reports that she has never smoked. She has never used smokeless tobacco. She reports that she does not drink alcohol or use illicit drugs.  No Known Allergies  History reviewed. No pertinent family history.  Prior to Admission medications   Medication Sig Start Date End Date Taking? Authorizing Provider  ibuprofen (ADVIL,MOTRIN) 200 MG tablet Take 400 mg by mouth daily as needed (pain).    Yes Historical Provider, MD  Norethindrone  Acetate-Ethinyl Estrad-FE (LOMEDIA 24 FE) 1-20 MG-MCG(24) tablet Take 1 tablet by mouth daily at 6 PM.    Yes Historical Provider, MD    Physical Exam: Filed Vitals:   11/21/13 1812 11/21/13 1830 11/21/13 1930 11/21/13 1945  BP: 108/70 107/66 106/71 113/73  Pulse: 115 104 111 107  Resp: 20  19 18   SpO2: 99% 99% 99% 100%    General: Alert, Awake and Oriented to Time, Place and Person. Appear in mild distress Eyes: PERRL ENT: Oral Mucosa clear moist. Neck: No JVD Cardiovascular: S1 and S2 Present, no Murmur, Peripheral Pulses Present Respiratory: Bilateral Air entry equal and Decreased, left-sided basal Crackles, no wheezes Abdomen: Bowel Sound Present, Soft and Non tender Skin: No Rash Extremities: No Pedal edema, no calf tenderness Neurologic: Grossly no focal neuro deficit. Labs on Admission:  CBC:  Recent Labs Lab 11/21/13 1304  WBC 7.1  NEUTROABS 5.4  HGB 11.2*  HCT 30.3*  MCV 82.6  PLT 135*    CMP     Component Value Date/Time   NA 138 11/21/2013 1304   K 3.6* 11/21/2013 1304   CL 103 11/21/2013 1304   CO2 23 11/21/2013 1304   GLUCOSE 88 11/21/2013 1304   BUN 3* 11/21/2013 1304   CREATININE 0.58 11/21/2013 1304   CALCIUM 9.2 11/21/2013 1304   PROT 8.2 11/21/2013 1304   ALBUMIN 3.8 11/21/2013 1304   AST 15 11/21/2013 1304   ALT 5 11/21/2013 1304   ALKPHOS 50 11/21/2013 1304  BILITOT 0.9 11/21/2013 1304   GFRNONAA >90 11/21/2013 1304   GFRAA >90 11/21/2013 1304    No results found for this basename: LIPASE, AMYLASE,  in the last 168 hours No results found for this basename: AMMONIA,  in the last 168 hours  No results found for this basename: CKTOTAL, CKMB, CKMBINDEX, TROPONINI,  in the last 168 hours BNP (last 3 results) No results found for this basename: PROBNP,  in the last 8760 hours  Radiological Exams on Admission: Dg Chest 2 View  11/21/2013   CLINICAL DATA:  Chest pain.  EXAM: CHEST  2 VIEW  COMPARISON:  Chest x-ray 06/20/2012.  FINDINGS: Previously identified PICC line  has been removed. Mediastinum and hilar structures normal. Diffuse bilateral interstitial prominence noted consistent with pneumonitis. Stable cardiomegaly. Normal pulmonary vascularity. No pleural effusion or pneumothorax. No acute bony abnormality .  IMPRESSION: 1. Diffuse bilateral pulmonary interstitial prominence suggesting interstitial pneumonitis. 2. Stable cardiomegaly, no pulmonary venous congestion.   Electronically Signed   By: Maisie Fushomas  Register   On: 11/21/2013 16:45   Ct Angio Chest Pe W/cm &/or Wo Cm  11/21/2013   CLINICAL DATA:  Chest pain.  Short of breath.  EXAM: CT ANGIOGRAPHY CHEST WITH CONTRAST  TECHNIQUE: Multidetector CT imaging of the chest was performed using the standard protocol during bolus administration of intravenous contrast. Multiplanar CT image reconstructions and MIPs were obtained to evaluate the vascular anatomy.  CONTRAST:  100mL OMNIPAQUE IOHEXOL 350 MG/ML SOLN  COMPARISON:  06/17/2012  FINDINGS: There are no filling defects in the pulmonary arterial tree suggest acute pulmonary thromboembolism.  There is residual thymus.  No abnormal mediastinal adenopathy.  No pneumothorax.  No pleural effusion.  There is airspace disease at the left base with a peripheral distribution. It is wedge-shaped.  No acute bony deformity. Slight dextroscoliosis in the thoracic spine.  Review of the MIP images confirms the above findings.  IMPRESSION: No evidence of acute pulmonary thromboembolism.  Left lower lobe airspace disease. Follow-up by radiographs until resolution is recommended. In retrospect, it is visualized on the radiograph performed earlier today.   Electronically Signed   By: Maryclare BeanArt  Hoss M.D.   On: 11/21/2013 19:20    EKG: Independently reviewed. sinus tachycardia.  Assessment/Plan Principal Problem:   Sickle cell crisis acute chest syndrome Active Problems:   Sinus tachycardia   Chest pain   1. Sickle cell crisis acute chest syndrome The patient is presenting with  complaints of chest pain. She is found to have tachycardia and tachypnea. She has chest x-ray and CT scan which is showing evidence of possible left-sided infiltrate or consolidation. CT scan is negative for pulmonary embolism. Her hemoglobin is stable at her baseline. Her pain has improved significantly. She does not have any hypoxia. With this the patient appears to have acute chest syndrome which is mild. She does not appear to require acute blood transfusion at present. They would admit her to telemetry, I will give her IV ceftriaxone and azithromycin as empirically antibiotics. Blood cultures are done. Give her IV hydration. For pain since her pain has improved significantly with one dose of IV Toradol I would continue her on the same and also supplement it with IV morphine as and when needed. She will be placed on PCA if her pain does not improve with this combination.  DVT Prophylaxis: subcutaneous Heparin Nutrition: Regular diet  Code Status: Full  Family Communication: Family was present at bedside, opportunity was given to ask question and all questions were  answered satisfactorily at the time of interview. Disposition: Admitted to inpatient in telemetry unit.  Author: Lynden Oxford, MD [facility] Pager: [phone] 11/21/2013, 10:38 PM    If 7PM-7AM, please contact night-coverage [email or web address] Password [email or web address]

## 2013-11-21 NOTE — ED Provider Notes (Signed)
Pt seen by PA Forucci: Makayla Fletcher is a 18 y.o. female with past medical history significant for sickle cell complaining of chest pain and dyspnea on exertion worsening of the course of last week. Chest x-ray shows interstitial disease and CT angios shows a left lower lobe airspace disease. Patient is afebrile, no white count, H&H stable however there was concern for possible early acute chest.  Hospitalist consult from Dr. Allena Katz appreciated: He will come to evaluate the patient and advised on admission versus discharge.   Dr. Allena Katz has evaluated the patient and feels that she warrants admission. Patient will be transferred to [facility] long period temporary holding orders placed with that request at [place] to telemetry.   Wynetta Emery, PA-C 11/21/13 2207

## 2013-11-21 NOTE — ED Notes (Signed)
Per pt sts she has been having intermittent chest pain and SOB over the past few days. Pt hx of sickle cell. VS WNL

## 2013-11-22 MED ORDER — LEVOFLOXACIN 750 MG PO TABS
750.0000 mg | ORAL_TABLET | Freq: Every day | ORAL | Status: DC
Start: 2013-11-22 — End: 2013-11-29

## 2013-11-22 MED ORDER — OXYCODONE HCL 5 MG PO TABS: 5.0000 mg | ORAL_TABLET | Freq: Four times a day (QID) | ORAL | Status: DC | PRN

## 2013-11-22 MED ORDER — FOLIC ACID 1 MG PO TABS: 1.0000 mg | ORAL_TABLET | Freq: Every day | ORAL | Status: DC

## 2013-11-22 NOTE — Progress Notes (Deleted)
Makayla Fletcher MRN: [id number] DOB/AGE: [birth year] 20 y.o.  Admit date: 11/21/2013 Discharge date: 11/22/2013  Primary Care Physician:  Usiel Astarita A., MD   Discharge Diagnoses:   Patient Active Problem List   Diagnosis Date Noted  . Chest pain 11/21/2013  . Sickle cell crisis acute chest syndrome 11/21/2013  . Dizziness 08/24/2013  . 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 Sinus tachycardia 06/18/2012  . Hemoglobin [place] with crisis 06/18/2012  . Menorrhagia with irregular cycle 06/18/2012  . Acute chest syndrome due to sickle cell crisis 06/18/2012  . Sickle cell anemia 06/17/2012  . Healthcare-associated pneumonia 06/17/2012  . Hyponatremia 06/17/2012  . Depression, major, single episode, moderate 07/31/2011  . Generalized anxiety disorder 07/31/2011    DISCHARGE MEDICATION:   Medication List         folic acid 1 MG tablet  Commonly known as:  FOLVITE  Take 1 tablet (1 mg total) by mouth daily.     ibuprofen 200 MG tablet  Commonly known as:  ADVIL,MOTRIN  Take 400 mg by mouth daily as needed (pain).     levofloxacin 750 MG tablet  Commonly known as:  LEVAQUIN  Take 1 tablet (750 mg total) by mouth daily.     LOMEDIA 24 FE 1-20 MG-MCG(24) tablet  Generic drug:  Norethindrone Acetate-Ethinyl Estrad-FE  Take 1 tablet by mouth daily at 6 PM.     oxyCODONE 5 MG immediate release tablet  Commonly known as:  ROXICODONE  Take 1 tablet (5 mg total) by mouth every 6 (six) hours as needed for severe pain.          Consults:     SIGNIFICANT DIAGNOSTIC STUDIES:  Dg Chest 2 View  11/21/2013   CLINICAL DATA:  Chest pain.  EXAM: CHEST  2 VIEW  COMPARISON:  Chest x-ray 06/20/2012.  FINDINGS: Previously identified PICC line has been removed. Mediastinum and hilar structures normal. Diffuse bilateral interstitial prominence noted consistent with pneumonitis. Stable cardiomegaly. Normal pulmonary vascularity. No pleural effusion or pneumothorax. No  acute bony abnormality .  IMPRESSION: 1. Diffuse bilateral pulmonary interstitial prominence suggesting interstitial pneumonitis. 2. Stable cardiomegaly, no pulmonary venous congestion.   Electronically Signed   By: Maisie Fus  Register   On: 11/21/2013 16:45   Ct Angio Chest Pe W/cm &/or Wo Cm  11/21/2013   CLINICAL DATA:  Chest pain.  Short of breath.  EXAM: CT ANGIOGRAPHY CHEST WITH CONTRAST  TECHNIQUE: Multidetector CT imaging of the chest was performed using the standard protocol during bolus administration of intravenous contrast. Multiplanar CT image reconstructions and MIPs were obtained to evaluate the vascular anatomy.  CONTRAST:  OMNIPAQUE IOHEXOL 350 MG/ML SOLN  COMPARISON:  06/17/2012  FINDINGS: There are no filling defects in the pulmonary arterial tree suggest acute pulmonary thromboembolism.  There is residual thymus.  No abnormal mediastinal adenopathy.  No pneumothorax.  No pleural effusion.  There is airspace disease at the left base with a peripheral distribution. It is wedge-shaped.  No acute bony deformity. Slight dextroscoliosis in the thoracic spine.  Review of the MIP images confirms the above findings.  IMPRESSION: No evidence of acute pulmonary thromboembolism.  Left lower lobe airspace disease. Follow-up by radiographs until resolution is recommended. In retrospect, it is visualized on the radiograph performed earlier today.   Electronically Signed   By: Maryclare Bean M.D.   On: 11/21/2013 19:20       No results found for this or any previous visit (from the past 240 hour(s)).  BRIEF ADMITTING H & P: Makayla Fletcher is a 18 y.o. female with Past medical HbSC disease, anxiety.  The patient presented with complaints of chest pain that has been ongoing since last one week. She has a history of sickle cell disease but has had no analgesic medications for quite some time. Today the pain was significantly worse and was associated with shortness of breath and cough and therefore she  decided to come to the hospital. She denies any sputum production. She had chest pain which is located substernally as well as on both side of her lower chest, it gets worse with cough as well as deep breaths. She also feels like pressure.  She complains of feeling tired and short of breath on walking around.  She denies any recent travel or sick contact. She denies ever being on hydroxyurea.   Hospital Course:  Present on Admission:  . Hb [place] with crisis: Pt was treated with oral medications and required only 1 dose of IV for breakthrough pain. She is feeling well and is at her baseline for pain and ADL function thus she is being discharged home with Oxycodone. She is tot follow up in the office in 1 week.  . CAP: Pt was found to have infiltrates on radiologic studies. She was started on Levaquin and will contiue for an 8 day course.  . Sinus tachycardia: Felt to be secondary to pain and dehydration. HR at present 89. Will follow up in the clinic.  . Mild Hypokalemia: replaced orally  Disposition and Follow-up:  Pt discharged is stable condition and is to follow up in the clinic in 1 week.     Discharge Instructions   Activity as tolerated - No restrictions    Complete by:  As directed      Diet general    Complete by:  As directed            DISCHARGE EXAM:  General: Alert, awake, oriented x3, in no acute distress. Pt well appearing and up ambulating. Vital Signs: BP 101/64, HR 101,T 97.9 F (36.6 C), temperature source Oral, RR 18, height 5\' 2"  (1.575 m), weight 114 lb (51.71 kg), last menstrual period 11/08/2013, SpO2 99.00%. HEENT: [place]/AT PEERL, EOMI, anicteric  OROPHARYNX: Moist, No exudate/ erythema/lesions.  Heart: Regular rate and rhythm, without murmurs, rubs, gallops.  Lungs: Clear to auscultation, no wheezing or rhonchi noted.  Abdomen: Soft, nontender, nondistended, positive bowel sounds, no masses no hepatosplenomegaly noted.  Neuro: No focal neurological deficits  noted cranial nerves II through XII grossly intact. Strength normal in bilateral upper and lower extremities.  Musculoskeletal: No warm swelling or erythema around joints, no spinal tenderness noted.  Psychiatric: Patient alert and oriented x3, good insight and cognition, good recent to remote recall.    Recent Labs  11/21/13 1304  NA 138  K 3.6*  CL 103  CO2 23  GLUCOSE 88  BUN 3*  CREATININE 0.58  CALCIUM 9.2    Recent Labs  11/21/13 1304  AST 15  ALT 5  ALKPHOS 50  BILITOT 0.9  PROT 8.2  ALBUMIN 3.8   No results found for this basename: LIPASE, AMYLASE,  in the last 72 hours  Recent Labs  11/21/13 1304  WBC 7.1  NEUTROABS 5.4  HGB 11.2*  HCT 30.3*  MCV 82.6  PLT 135*   Total time spent face to face and in decision making is greater than 30 minutes. Signed: Altha HarmMichelle A Cynithia Hakimi 11/22/2013, 11:44 AM

## 2013-11-23 NOTE — ED Provider Notes (Signed)
Medical screening examination/treatment/procedure(s) were conducted as a shared visit with non-physician practitioner(s) and myself.  I personally evaluated the patient during the encounter.  Please see separate associated note for evaluation and plan.    EKG Interpretation   Date/Time:  Tuesday November 21 2013 12:40:05 EDT Ventricular Rate:  124 PR Interval:    QRS Duration: 66 QT Interval:  418 QTC Calculation: 600 R Axis:   87 Text Interpretation:  Sinus tachycardia T wave abnormality, consider  lateral ischemia Abnormal ECG SINCE LAST TRACING HEART RATE HAS INCREASED  NOW SEEN Nonspecific ST abnormality New since previous tracing Confirmed  by Ethelda Chick  MD, SAM [phone]) on 11/21/2013 3:35:00 PM       Gilda Crease, MD 11/23/13 [phone]

## 2013-11-23 NOTE — ED Provider Notes (Signed)
Medical screening examination/treatment/procedure(s) were conducted as a shared visit with non-physician practitioner(s) and myself.  I personally evaluated the patient during the encounter.  Please see separate associated note for evaluation and plan.    EKG Interpretation   Date/Time:  Tuesday November 21 2013 12:40:05 EDT Ventricular Rate:  124 PR Interval:    QRS Duration: 66 QT Interval:  418 QTC Calculation: 600 R Axis:   87 Text Interpretation:  Sinus tachycardia T wave abnormality, consider  lateral ischemia Abnormal ECG SINCE LAST TRACING HEART RATE HAS INCREASED  NOW SEEN Nonspecific ST abnormality New since previous tracing Confirmed  by Ethelda Chick  MD, SAM [phone]) on 11/21/2013 3:35:00 PM       Gilda Crease, MD 11/23/13 0200

## 2013-11-28 LAB — CULTURE, BLOOD (ROUTINE X 2)
CULTURE: NO GROWTH
Culture: NO GROWTH

## 2013-11-29 ENCOUNTER — Encounter: Payer: Self-pay | Admitting: Family Medicine

## 2013-11-29 ENCOUNTER — Ambulatory Visit (INDEPENDENT_AMBULATORY_CARE_PROVIDER_SITE_OTHER): Payer: Medicaid Other | Admitting: Family Medicine

## 2013-11-29 VITALS — BP 108/70 | HR 100 | Temp 98.9°F | Resp 20 | Wt 112.0 lb

## 2013-11-29 DIAGNOSIS — R42 Dizziness and giddiness: Secondary | ICD-10-CM

## 2013-11-29 DIAGNOSIS — D5720 Sickle-cell/Hb-C disease without crisis: Secondary | ICD-10-CM

## 2013-11-29 NOTE — Progress Notes (Signed)
   Subjective:    Patient ID: Makayla Fletcher, female    DOB: 23-Dec-1995, 18 y.o.   MRN: [id number]  HPI 18 year old patient with a history of sickle cell anemia, HbSC presents for follow up of hospital visit on 11/21/2013 for chest pains. Patient states that she feels well today. She is not complaining of pain or discomfort.  in office for hospital follow up.  Patient reports occasional dizziness and unsteady gait that occur periodically. Patient reports that symptoms are intermittent and typically improve with lying down or stopping activities. Also, dizziness improves with lying down.      Review of Systems  Constitutional: Negative.   HENT: Negative.   Eyes: Negative.   Respiratory: Negative.   Cardiovascular: Negative.   Gastrointestinal: Negative.   Endocrine: Negative.   Genitourinary: Negative.   Allergic/Immunologic: Negative.   Neurological: Negative.   Hematological: Negative.   Psychiatric/Behavioral: Negative.        Objective:   Physical Exam  Constitutional: She appears well-developed and well-nourished.  HENT:  Head: Normocephalic.  Eyes: Conjunctivae are normal. Pupils are equal, round, and reactive to light.  Neck: Normal range of motion. Neck supple.  Cardiovascular: Normal rate, regular rhythm and normal heart sounds.   Pulmonary/Chest: Effort normal and breath sounds normal.  Abdominal: Soft. Bowel sounds are normal.  Musculoskeletal: Normal range of motion.  Neurological: She is alert.  Skin: Skin is warm and dry.  Psychiatric: She has a normal mood and affect. Her behavior is normal. Judgment and thought content normal.          Assessment & Plan:  1. Sickle cell disease, HbSC: Reviewed previous labs, consistent with baseline. Patient states that she has been taking folic acid daily. Also, reports that she has not required pain medication in 4 days. She typically takes Ibuprofen to alleviate pain. Discussed drinking 6-8 glasses of water daily to  maintain hydration, patient expressed understanding. Reviewed labs, consistent with baseline.  2. Disequilibruim: Patient states that she periodically has imbalances. Reports that she has not received a call from Dr. Marlis Edelson office for evaluation. Will notify Dr. Sampson Goon. She has an unsteady gait sometimes which causes her to have to stop activities.  She denies palpitations,hearing or vision problems, weakness or any focal sensory motor deficits.   RTC: As scheduled with Dr. Ashley Royalty

## 2013-12-07 NOTE — Discharge Summary (Signed)
Patient ID: Makayla Fletcher, female   DOB: [birth year], 18 y.o.   MRN: [id number] Discharge Summary was done under incorrect heading and is being filed under correct heading as is.    Altha HarmMichelle A Matthews, MD Physician Signed Internal Medicine Progress Notes Service date: 11/22/2013  11:44 AM   Makayla Spinealiyah K Huizenga  MRN: [id number]  DOB/AGE: [birth year] 18 y.o.  Admit date: 11/21/2013  Discharge date: 11/22/2013  Primary Care Physician: MATTHEWS,MICHELLE A., MD  Discharge Diagnoses:    Patient Active Problem List     Diagnosis  Date Noted    .  Chest pain  11/21/2013    .  Sickle cell crisis acute chest syndrome  11/21/2013    .  Dizziness  08/24/2013    .  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  Sinus tachycardia  06/18/2012    .  Hemoglobin [place] with crisis  06/18/2012    .  Menorrhagia with irregular cycle  06/18/2012    .  Acute chest syndrome due to sickle cell crisis  06/18/2012    .  Sickle cell anemia  06/17/2012    .  Healthcare-associated pneumonia  06/17/2012    .  Hyponatremia  06/17/2012    .  Depression, major, single episode, moderate  07/31/2011    .  Generalized anxiety disorder  07/31/2011     DISCHARGE MEDICATION:       Medication List            folic acid 1 MG tablet      Commonly known as: FOLVITE      Take 1 tablet (1 mg total) by mouth daily.      ibuprofen 200 MG tablet      Commonly known as: ADVIL,MOTRIN      Take 400 mg by mouth daily as needed (pain).      levofloxacin 750 MG tablet      Commonly known as: LEVAQUIN      Take 1 tablet (750 mg total) by mouth daily.      LOMEDIA 24 FE 1-20 MG-MCG(24) tablet      Generic drug: Norethindrone Acetate-Ethinyl Estrad-FE      Take 1 tablet by mouth daily at 6 PM.      oxyCODONE 5 MG immediate release tablet      Commonly known as: ROXICODONE      Take 1 tablet (5 mg total) by mouth every 6 (six) hours as needed for severe pain.         Consults:  SIGNIFICANT DIAGNOSTIC STUDIES:   Dg Chest 2 View   11/21/2013 CLINICAL DATA: Chest pain. EXAM: CHEST 2 VIEW COMPARISON: Chest x-ray 06/20/2012. FINDINGS: Previously identified PICC line has been removed. Mediastinum and hilar structures normal. Diffuse bilateral interstitial prominence noted consistent with pneumonitis. Stable cardiomegaly. Normal pulmonary vascularity. No pleural effusion or pneumothorax. No acute bony abnormality . IMPRESSION: 1. Diffuse bilateral pulmonary interstitial prominence suggesting interstitial pneumonitis. 2. Stable cardiomegaly, no pulmonary venous congestion.     Electronically Signed By: Maisie Fushomas Register            On: 11/21/2013 16:45    Ct Angio Chest Pe W/cm &/or Wo Cm   11/21/2013 CLINICAL DATA: Chest pain. Short of breath. EXAM: CT ANGIOGRAPHY CHEST WITH CONTRAST TECHNIQUE: Multidetector CT imaging of the chest was performed using the standard protocol during bolus administration of intravenous contrast. Multiplanar CT image reconstructions and MIPs were obtained to evaluate the vascular anatomy. CONTRAST: 100mL  OMNIPAQUE IOHEXOL 350 MG/ML SOLN COMPARISON: 06/17/2012 FINDINGS: There are no filling defects in the pulmonary arterial tree suggest acute pulmonary thromboembolism. There is residual thymus. No abnormal mediastinal adenopathy. No pneumothorax. No pleural effusion. There is airspace disease at the left base with a peripheral distribution. It is wedge-shaped. No acute bony deformity. Slight dextroscoliosis in the thoracic spine. Review of the MIP images confirms the above findings. IMPRESSION: No evidence of acute pulmonary thromboembolism. Left lower lobe airspace disease. Follow-up by radiographs until resolution is recommended. In retrospect, it is visualized on the radiograph performed earlier today. Electronically Signed By: Maryclare Bean M.D. On: 11/21/2013 19:20   No results found for this or any previous visit (from the past 240 hour(s)).     BRIEF ADMITTING H & P:  Makayla Fletcher is a  18 y.o. female with Past medical HbSC disease, anxiety.  The patient presented with complaints of chest pain that has been ongoing since last one week. She has a history of sickle cell disease but has had no analgesic medications for quite some time. Today the pain was significantly worse and was associated with shortness of breath and cough and therefore she decided to come to the hospital. She denies any sputum production. She had chest pain which is located substernally as well as on both side of her lower chest, it gets worse with cough as well as deep breaths. She also feels like pressure.  She complains of feeling tired and short of breath on walking around.  She denies any recent travel or sick contact. She denies ever being on hydroxyurea.   Hospital Course:  Present on Admission . Hb [place] with crisis: Pt was treated with oral medications and required only 1 dose of IV for breakthrough pain. She is feeling well and is at her baseline for pain and ADL function thus she is being discharged home with Oxycodone. She is tot follow up in the office in 1 week.   . CAP: Pt was found to have infiltrates on radiologic studies. She was started on Levaquin and will contiue for an 8 day course.  . Sinus tachycardia: Felt to be secondary to pain and dehydration. HR at present 89. Will follow up in the clinic.  . Mild Hypokalemia: replaced orally    Disposition and Follow-up:  Pt discharged is stable condition and is to follow up in the clinic in 1 week.    Discharge Instructions  Activity as tolerated - No restrictions          Complete by: As directed   Diet general                                                     Complete by: As directed     DISCHARGE EXAM:  General: Alert, awake, oriented x3, in no acute distress. Pt well appearing and up ambulating.  Vital Signs: BP 101/64, HR 101,T 97.9 F (36.6 C), temperature source Oral, RR 18, height 5\' 2"  (1.575 m), weight 114 lb (51.71 kg), last  menstrual period 11/08/2013, SpO2 99.00%.  HEENT: [place]/AT PEERL, EOMI, anicteric  OROPHARYNX: Moist, No exudate/ erythema/lesions.  Heart: Regular rate and rhythm, without murmurs, rubs, gallops.  Lungs: Clear to auscultation, no wheezing or rhonchi noted.  Abdomen: Soft, nontender, nondistended, positive bowel sounds, no masses no hepatosplenomegaly noted.  Neuro:  No focal neurological deficits noted cranial nerves II through XII grossly intact. Strength normal in bilateral upper and lower extremities.  Musculoskeletal: No warm swelling or erythema around joints, no spinal tenderness noted.  Psychiatric: Patient alert and oriented x3, good insight and cognition, good recent to remote recall.    Recent Labs   11/21/13 1304   NA  138   K  3.6*   CL  103   CO2  23   GLUCOSE  88   BUN  3*   CREATININE  0.58   CALCIUM  9.2     Recent Labs   11/21/13 1304   AST  15   ALT  5   ALKPHOS  50   BILITOT  0.9   PROT  8.2   ALBUMIN  3.8    No results found for this basename: LIPASE, AMYLASE, in the last 72 hours   Recent Labs   11/21/13 1304   WBC  7.1   NEUTROABS  5.4   HGB  11.2*   HCT  30.3*   MCV  82.6   PLT  135*    Total time spent face to face and in decision making is greater than 30 minutes.  Signed:  Altha HarmMichelle A Matthews  11/22/2013, 11:44 AM

## 2013-12-18 ENCOUNTER — Encounter: Payer: Self-pay | Admitting: Neurology

## 2013-12-18 ENCOUNTER — Ambulatory Visit (INDEPENDENT_AMBULATORY_CARE_PROVIDER_SITE_OTHER): Payer: Medicaid Other | Admitting: Neurology

## 2013-12-18 VITALS — BP 115/80 | HR 105 | Ht 64.5 in | Wt 114.0 lb

## 2013-12-18 DIAGNOSIS — R42 Dizziness and giddiness: Secondary | ICD-10-CM

## 2013-12-18 DIAGNOSIS — G43009 Migraine without aura, not intractable, without status migrainosus: Secondary | ICD-10-CM

## 2013-12-18 NOTE — Patient Instructions (Signed)

## 2013-12-18 NOTE — Progress Notes (Signed)
Reason for visit: Headache  Makayla Fletcher is a 18 y.o. female  History of present illness:  [name] is a 18 year old right-handed black female with a history of hemoglobin [place] disease. The patient indicates that within the last several months, she has begun having headaches. The headaches are in the temporal area, either on the right side or the left side. The headaches are associated with a lightheaded dizzy sensation, not true vertigo. The patient reports no visual changes or nausea or vomiting with the headache. The patient indicates that the headache will last about 1 hour when she takes ibuprofen. The headaches are occurring on average twice a month. She is on birth control pills, but she denies any change in the dosing of the pills, and she indicates that she has been on the same pill for several years. She denies any numbness or weakness of the face, arms, or legs. She has not had any slurred speech, visual loss, double vision, or visual aura. The patient indicates that her mother also has headaches, but no other family members have headache. She denies any neck pain or neck stiffness. She does not note any particular factors that will bring on the headache. She denies any scalp tenderness. She is sent to this office for an evaluation.  Past Medical History  Diagnosis Date  . Sickle cell anemia   . Urinary tract infection July 2013    frequent   . Depression   . Migraine without aura, without mention of intractable migraine without mention of status migrainosus     Past Surgical History  Procedure Laterality Date  . No past surgeries      Family History  Problem Relation Age of Onset  . Migraines Mother     Social history:  reports that she has never smoked. She has never used smokeless tobacco. She reports that she does not drink alcohol or use illicit drugs.  Medications:  Current Outpatient Prescriptions on File Prior to Visit  Medication Sig Dispense Refill  .  folic acid (FOLVITE) 1 MG tablet Take 1 tablet (1 mg total) by mouth daily.  30 tablet  11  . ibuprofen (ADVIL,MOTRIN) 200 MG tablet Take 400 mg by mouth daily as needed (pain).       . Norethindrone Acetate-Ethinyl Estrad-FE (LOMEDIA 24 FE) 1-20 MG-MCG(24) tablet Take 1 tablet by mouth daily at 6 PM.       . oxyCODONE (ROXICODONE) 5 MG immediate release tablet Take 1 tablet (5 mg total) by mouth every 6 (six) hours as needed for severe pain.  30 tablet  0   No current facility-administered medications on file prior to visit.     No Known Allergies  ROS:  Out of a complete 14 system review of symptoms, the patient complains only of the following symptoms, and all other reviewed systems are negative.  Palpitations of the heart, heart murmur Dizziness Headache, weakness Anxiety  Blood pressure 115/80, pulse 105, height 5' 4.5" (1.638 m), weight 114 lb (51.71 kg), last menstrual period 11/08/2013.  Physical Exam  General: The patient is alert and cooperative at the time of the examination.  Eyes: Pupils are equal, round, and reactive to light. Discs are flat bilaterally.  Neck: The neck is supple, no carotid bruits are noted.  Respiratory: The respiratory examination is clear.  Cardiovascular: The cardiovascular examination reveals a regular rate and rhythm, no obvious murmurs or rubs are noted.  Neuromuscular: Range of movement of the cervical spine is  full.  Skin: Extremities are without significant edema.  Neurologic Exam  Mental status: The patient is alert and oriented x 3 at the time of the examination. The patient has apparent normal recent and remote memory, with an apparently normal attention span and concentration ability.  Cranial nerves: Facial symmetry is present. There is good sensation of the face to pinprick and soft touch bilaterally. The strength of the facial muscles and the muscles to head turning and shoulder shrug are normal bilaterally. Speech is well  enunciated, no aphasia or dysarthria is noted. Extraocular movements are full. Visual fields are full. The tongue is midline, and the patient has symmetric elevation of the soft palate. No obvious hearing deficits are noted.  Motor: The motor testing reveals 5 over 5 strength of all 4 extremities. Good symmetric motor tone is noted throughout.  Sensory: Sensory testing is intact to pinprick, soft touch, vibration sensation, and position sense on all 4 extremities. No evidence of extinction is noted.  Coordination: Cerebellar testing reveals good finger-nose-finger and heel-to-shin bilaterally.  Gait and station: Gait is normal. Tandem gait is normal. Romberg is negative. No drift is seen.  Reflexes: Deep tendon reflexes are symmetric and normal bilaterally. Toes are downgoing bilaterally.   Assessment/Plan:  1. Probable migraine headache  2. Hemoglobin [place] disease  The patient gives a history that could be consistent with migraine. Given her hemoglobin [place] disease, we will check MRI evaluation of the brain, MRA of the head. If the studies are unremarkable, the patient will continue her current therapy as she is able to eliminate the headache within one hour with simple use of ibuprofen. She will contact our office if the headaches become more frequent. There is no indication for daily prophylactic medication at this time.  Marlan Palau. Keith Willis MD 12/18/2013 9:00 PM  [facility] [address] [place], [place] [id number]  Phone [phone] Fax [phone]

## 2013-12-28 ENCOUNTER — Ambulatory Visit
Admission: RE | Admit: 2013-12-28 | Discharge: 2013-12-28 | Disposition: A | Discharge status: Home / Self Care | Payer: Medicaid Other | Source: Ambulatory Visit | Attending: Neurology | Admitting: Neurology

## 2013-12-28 DIAGNOSIS — G43009 Migraine without aura, not intractable, without status migrainosus: Secondary | ICD-10-CM

## 2013-12-28 DIAGNOSIS — R42 Dizziness and giddiness: Secondary | ICD-10-CM

## 2013-12-29 ENCOUNTER — Telehealth: Payer: Self-pay | Admitting: Neurology

## 2013-12-29 NOTE — Telephone Encounter (Signed)
  I called patient. MRI and MRA of the head were unremarkable. The patient likely has migraine.   MRA head 12/28/2013:  Impression   Unremarkable MRA of the brain showing no significant stenosis  a medium to large sized intracranial vessels or aneurysms.   MRI brain 12/28/13:  IMPRESSION: Unremarkable MRI scan of the brain without contrast.

## 2014-02-15 ENCOUNTER — Telehealth: Payer: Self-pay | Admitting: Hematology

## 2014-02-15 NOTE — Telephone Encounter (Signed)
Left message in regards to Sickle Cell day and the program that will take place on March 09, 2014 from 9-3.  Explained that a flyer will be coming in the mail as well.

## 2014-03-04 ENCOUNTER — Emergency Department (INDEPENDENT_AMBULATORY_CARE_PROVIDER_SITE_OTHER)
Admission: EM | Admit: 2014-03-04 | Discharge: 2014-03-04 | Disposition: A | Discharge status: Home / Self Care | Payer: Medicaid Other | Source: Home / Self Care | Attending: Emergency Medicine | Admitting: Emergency Medicine

## 2014-03-04 ENCOUNTER — Encounter (HOSPITAL_COMMUNITY): Payer: Self-pay | Admitting: Emergency Medicine

## 2014-03-04 DIAGNOSIS — K044 Acute apical periodontitis of pulpal origin: Secondary | ICD-10-CM

## 2014-03-04 DIAGNOSIS — K047 Periapical abscess without sinus: Secondary | ICD-10-CM

## 2014-03-04 DIAGNOSIS — K0889 Other specified disorders of teeth and supporting structures: Secondary | ICD-10-CM

## 2014-03-04 DIAGNOSIS — K089 Disorder of teeth and supporting structures, unspecified: Secondary | ICD-10-CM

## 2014-03-04 MED ORDER — HYDROCODONE-ACETAMINOPHEN 5-325 MG PO TABS: 1.0000 | ORAL_TABLET | Freq: Four times a day (QID) | ORAL | Status: DC | PRN

## 2014-03-04 MED ORDER — AMOXICILLIN 875 MG PO TABS: 875.0000 mg | ORAL_TABLET | Freq: Two times a day (BID) | ORAL | Status: DC

## 2014-03-04 NOTE — ED Provider Notes (Signed)
CSN: [id number]     Arrival date & time 03/04/14  1245 History   First MD Initiated Contact with Patient 03/04/14 1307     Chief Complaint  Patient presents with  . Dental Pain   (Consider location/radiation/quality/duration/timing/severity/associated sxs/prior Treatment) Patient is a 18 y.o. female presenting with tooth pain.  Dental Pain  18 year old female presents for evaluation of pain in her left lower wisdom tooth. She says that she had an appointment last week to get all of her wisdom teeth removed but due to her dentist rescheduled it. For 3 days she has had increasing pain and redness. She has a dental appointment this week she felt like she could not wait because the pain was too severe so she came in here. Pain is increased with chewing. She denies any other systemic symptoms. She is able to open her mouth all the way. No difficulty swallowing. No sublingual swelling or pain.  Past Medical History  Diagnosis Date  . Sickle cell anemia   . Urinary tract infection July 2013    frequent   . Depression   . Migraine without aura, without mention of intractable migraine without mention of status migrainosus    Past Surgical History  Procedure Laterality Date  . No past surgeries     Family History  Problem Relation Age of Onset  . Migraines Mother    History  Substance Use Topics  . Smoking status: Never Smoker   . Smokeless tobacco: Never Used  . Alcohol Use: No   OB History   Grav Para Term Preterm Abortions TAB SAB Ect Mult Living                 Review of Systems  HENT: Positive for dental problem.   All other systems reviewed and are negative.   Allergies  Review of patient's allergies indicates no known allergies.  Home Medications   Prior to Admission medications   Medication Sig Start Date End Date Taking? Authorizing Provider  ibuprofen (ADVIL,MOTRIN) 200 MG tablet Take 400 mg by mouth daily as needed (pain).    Yes Historical Provider, MD   Norethindrone Acetate-Ethinyl Estrad-FE (LOMEDIA 24 FE) 1-20 MG-MCG(24) tablet Take 1 tablet by mouth daily at 6 PM.    Yes Historical Provider, MD  amoxicillin (AMOXIL) 875 MG tablet Take 1 tablet (875 mg total) by mouth 2 (two) times daily. 03/04/14   Graylon Good, PA-C  folic acid (FOLVITE) 1 MG tablet Take 1 tablet (1 mg total) by mouth daily. 11/22/13   Altha Harm, MD  HYDROcodone-acetaminophen (NORCO) 5-325 MG per tablet Take 1 tablet by mouth every 6 (six) hours as needed for moderate pain. 03/04/14   Graylon Good, PA-C  oxyCODONE (ROXICODONE) 5 MG immediate release tablet Take 1 tablet (5 mg total) by mouth every 6 (six) hours as needed for severe pain. 11/22/13   Altha Harm, MD   BP 115/78  Pulse 109  Temp(Src) 99.2 F (37.3 C) (Oral)  Resp 12  SpO2 100%  LMP 02/25/2014 Physical Exam  Nursing note and vitals reviewed. Constitutional: She is oriented to person, place, and time. Vital signs are normal. She appears well-developed and well-nourished. No distress.  HENT:  Head: Normocephalic and atraumatic.  Mouth/Throat: Uvula is midline. No dental abscesses or dental caries.  The gums around the left lower posterior wisdom tooth are erythematous and tender with no obvious abscess, no drainage  Pulmonary/Chest: Effort normal. No respiratory distress.  Neurological: She is alert  and oriented to person, place, and time. She has normal strength. Coordination normal.  Skin: Skin is warm and dry. No rash noted. She is not diaphoretic.  Psychiatric: She has a normal mood and affect. Judgment normal.    ED Course  Procedures (including critical care time) Labs Review Labs Reviewed - No data to display  Imaging Review No results found.   MDM   1. Dental infection   2. Toothache    Infection, treat with amoxicillin, Norco for pain. Followup with dentist this week   Meds ordered this encounter  Medications  . amoxicillin (AMOXIL) 875 MG tablet    Sig: Take  1 tablet (875 mg total) by mouth 2 (two) times daily.    Dispense:  14 tablet    Refill:  0    Order Specific Question:  Supervising Provider    Answer:  Lorenz Coaster, DAVID C [id number]  . HYDROcodone-acetaminophen (NORCO) 5-325 MG per tablet    Sig: Take 1 tablet by mouth every 6 (six) hours as needed for moderate pain.    Dispense:  12 tablet    Refill:  0    Order Specific Question:  Supervising Provider    Answer:  Lorenz Coaster, DAVID C [[id number]]       Graylon Good, PA-C 03/04/14 1545

## 2014-03-04 NOTE — ED Provider Notes (Signed)
Medical screening examination/treatment/procedure(s) were performed by a resident physician or non-physician practitioner and as the supervising physician I was immediately available for consultation/collaboration.  Shelly Flatten, MD Family Medicine   Ozella Rocks, MD 03/04/14 1556

## 2014-03-04 NOTE — Discharge Instructions (Signed)
Dental Care and Dentist Visits  Dental care supports good overall health. Regular dental visits can also help you avoid dental pain, bleeding, infection, and other more serious health problems in the future. It is important to keep the mouth healthy because diseases in the teeth, gums, and other oral tissues can spread to other areas of the body. Some problems, such as diabetes, heart disease, and pre-term labor have been associated with poor oral health.   See your dentist every 6 months. If you experience emergency problems such as a toothache or broken tooth, go to the dentist right away. If you see your dentist regularly, you may catch problems early. It is easier to be treated for problems in the early stages.   WHAT TO EXPECT AT A DENTIST VISIT   Your dentist will look for many common oral health problems and recommend proper treatment. At your regular dental visit, you can expect:  · Gentle cleaning of the teeth and gums. This includes scraping and polishing. This helps to remove the sticky substance around the teeth and gums (plaque). Plaque forms in the mouth shortly after eating. Over time, plaque hardens on the teeth as tartar. If tartar is not removed regularly, it can cause problems. Cleaning also helps remove stains.  · Periodic X-rays. These pictures of the teeth and supporting bone will help your dentist assess the health of your teeth.  · Periodic fluoride treatments. Fluoride is a natural mineral shown to help strengthen teeth. Fluoride treatment involves applying a fluoride gel or varnish to the teeth. It is most commonly done in children.  · Examination of the mouth, tongue, jaws, teeth, and gums to look for any oral health problems, such as:  ¨ Cavities (dental caries). This is decay on the tooth caused by plaque, sugar, and acid in the mouth. It is best to catch a cavity when it is small.  ¨ Inflammation of the gums caused by plaque buildup (gingivitis).  ¨ Problems with the mouth or malformed  or misaligned teeth.  ¨ Oral cancer or other diseases of the soft tissues or jaws.   KEEP YOUR TEETH AND GUMS HEALTHY  For healthy teeth and gums, follow these general guidelines as well as your dentist's specific advice:  · Have your teeth professionally cleaned at the dentist every 6 months.  · Brush twice daily with a fluoride toothpaste.  · Floss your teeth daily.   · Ask your dentist if you need fluoride supplements, treatments, or fluoride toothpaste.  · Eat a healthy diet. Reduce foods and drinks with added sugar.  · Avoid smoking.  TREATMENT FOR ORAL HEALTH PROBLEMS  If you have oral health problems, treatment varies depending on the conditions present in your teeth and gums.  · Your caregiver will most likely recommend good oral hygiene at each visit.  · For cavities, gingivitis, or other oral health disease, your caregiver will perform a procedure to treat the problem. This is typically done at a separate appointment. Sometimes your caregiver will refer you to another dental specialist for specific tooth problems or for surgery.  SEEK IMMEDIATE DENTAL CARE IF:  · You have pain, bleeding, or soreness in the gum, tooth, jaw, or mouth area.  · A permanent tooth becomes loose or separated from the gum socket.  · You experience a blow or injury to the mouth or jaw area.  Document Released: 02/18/2011 Document Revised: 08/31/2011 Document Reviewed: 02/18/2011  Exit[employer]® Patient Information ©2015 [employer]. This information is not intended to replace advice   given to you by your health care provider. Make sure you discuss any questions you have with your health care provider.

## 2014-03-04 NOTE — ED Notes (Signed)
C/O left lower wisdom tooth pain and swelling to gums x 2 days without fever.

## 2014-03-05 ENCOUNTER — Ambulatory Visit: Payer: Medicaid Other | Admitting: Family Medicine

## 2014-03-07 ENCOUNTER — Ambulatory Visit: Payer: Medicaid Other | Admitting: Family Medicine

## 2014-03-13 ENCOUNTER — Ambulatory Visit (INDEPENDENT_AMBULATORY_CARE_PROVIDER_SITE_OTHER): Payer: Medicaid Other | Admitting: Family Medicine

## 2014-03-13 VITALS — BP 116/80 | HR 89 | Temp 98.3°F | Resp 16 | Ht 62.0 in | Wt 115.0 lb

## 2014-03-13 DIAGNOSIS — R002 Palpitations: Secondary | ICD-10-CM | POA: Insufficient documentation

## 2014-03-13 DIAGNOSIS — I499 Cardiac arrhythmia, unspecified: Secondary | ICD-10-CM

## 2014-03-13 DIAGNOSIS — R9431 Abnormal electrocardiogram [ECG] [EKG]: Secondary | ICD-10-CM

## 2014-03-13 DIAGNOSIS — D5720 Sickle-cell/Hb-C disease without crisis: Secondary | ICD-10-CM

## 2014-03-13 DIAGNOSIS — Z87898 Personal history of other specified conditions: Secondary | ICD-10-CM

## 2014-03-13 DIAGNOSIS — Z23 Encounter for immunization: Secondary | ICD-10-CM

## 2014-03-13 NOTE — Progress Notes (Signed)
   Subjective:    Patient ID: Makayla Fletcher, female    DOB: 23-Oct-1995, 18 y.o.   MRN: [id number]  HPI  Patient presents for follow-up of sickle cell disease, HbSC. She reports that her sickle cell anemia is managed well. She reports that she had pain 2 weeks ago that was relieved by hydration and OTC pain medications.   Patient complaining of periodic dizziness. She reports that she followed up with neurology 3 months ago for frequent headaches and had a benign examination.   Patient complains of periodic dizziness and palpitations.  She reports that she followed up with neurology 3 months ago for headaches and there were not abnormal findings identified. The symptoms are of mild and brief severity, occuring periodically and lasting a few seconds per episode. No cardiac risk factors have been identified. There are no aggravating or relieving factors identified.    Past Medical History  Diagnosis Date  . Sickle cell anemia   . Urinary tract infection July 2013    frequent   . Depression   . Migraine without aura, without mention of intractable migraine without mention of status migrainosus     Review of Systems  Constitutional: Negative.  Negative for fever, activity change, fatigue and unexpected weight change.  HENT: Negative.   Eyes: Negative.   Respiratory: Negative.   Cardiovascular: Positive for palpitations (periodic palpitations).  Gastrointestinal: Negative.   Genitourinary: Negative.   Musculoskeletal: Negative.   Skin: Negative.   Allergic/Immunologic: Negative.   Neurological: Positive for dizziness (occasionally). Negative for seizures, syncope, weakness, light-headedness and headaches.  Hematological: Negative.   Psychiatric/Behavioral: Negative.        Objective:   Physical Exam  Constitutional: She is oriented to person, place, and time. She appears well-developed and well-nourished.  HENT:  Head: Normocephalic and atraumatic.  Right Ear: External ear  normal.  Eyes: Conjunctivae are normal. Pupils are equal, round, and reactive to light.  Neck: Normal range of motion. Neck supple.  Cardiovascular: Normal rate, normal heart sounds and normal pulses.  An irregularly irregular rhythm present. Exam reveals no distant heart sounds.   No murmur heard. Pulmonary/Chest: Effort normal and breath sounds normal.  Abdominal: Soft. Bowel sounds are normal.  Musculoskeletal: Normal range of motion.  Neurological: She is alert and oriented to person, place, and time. She has normal reflexes.  Skin: Skin is warm and dry.  Psychiatric: She has a normal mood and affect. Her behavior is normal. Judgment and thought content normal.      BP 116/80  Pulse 89  Temp(Src) 98.3 F (36.8 C) (Oral)  Resp 16  Ht  (1.575 m)  Wt 115 lb (52.164 kg)  BMI 21.03 kg/m2  LMP 02/20/2014    Assessment & Plan:  1. 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  - Vitamin D, 25-hydroxy - CBC with Differential - COMPLETE METABOLIC PANEL WITH GFR - Ambulatory referral to Ophthalmology  2. Irregular heartbeat  - EKG 12-Lead - 2D Echocardiogram without contrast; Future  3. EKG, abnormal  - 2D Echocardiogram without contrast; Future  4. H/O dizziness She reports occasional dizziness. She denies weakness or headaches.  - Orthostatic vital signs completed, within normal limits  5. Need for immunization against influenza  - Flu Vaccine QUAD 36+ mos IM (Fluarix)   RTC: 3 months for f/u of SCD

## 2014-03-14 ENCOUNTER — Telehealth: Payer: Self-pay

## 2014-03-14 ENCOUNTER — Encounter: Payer: Self-pay | Admitting: Family Medicine

## 2014-03-14 LAB — CBC WITH DIFFERENTIAL/PLATELET
BASOS ABS: 0 10*3/uL (ref 0.0–0.1)
BASOS PCT: 0 % (ref 0–1)
Eosinophils Absolute: 0.2 10*3/uL (ref 0.0–0.7)
Eosinophils Relative: 4 % (ref 0–5)
HEMATOCRIT: 33.1 % — AB (ref 36.0–46.0)
Hemoglobin: 11.2 g/dL — ABNORMAL LOW (ref 12.0–15.0)
Lymphocytes Relative: 36 % (ref 12–46)
Lymphs Abs: 2.2 10*3/uL (ref 0.7–4.0)
MCH: 30 pg (ref 26.0–34.0)
MCHC: 33.8 g/dL (ref 30.0–36.0)
MCV: 88.7 fL (ref 78.0–100.0)
MONO ABS: 0.4 10*3/uL (ref 0.1–1.0)
Monocytes Relative: 7 % (ref 3–12)
Neutro Abs: 3.3 10*3/uL (ref 1.7–7.7)
Neutrophils Relative %: 53 % (ref 43–77)
Platelets: 165 10*3/uL (ref 150–400)
RBC: 3.73 MIL/uL — ABNORMAL LOW (ref 3.87–5.11)
RDW: 14.2 % (ref 11.5–15.5)
WBC: 6.2 10*3/uL (ref 4.0–10.5)

## 2014-03-14 NOTE — Telephone Encounter (Signed)
Patient has an Echocardiogram scheduled for 03/20/2014 :00pm at [place]. Thanks!

## 2014-03-15 ENCOUNTER — Telehealth: Payer: Self-pay

## 2014-03-15 ENCOUNTER — Ambulatory Visit (INDEPENDENT_AMBULATORY_CARE_PROVIDER_SITE_OTHER): Payer: Medicaid Other | Admitting: Family Medicine

## 2014-03-15 VITALS — BP 114/77 | HR 101 | Temp 98.3°F | Resp 18 | Ht 62.0 in | Wt 118.0 lb

## 2014-03-15 DIAGNOSIS — R42 Dizziness and giddiness: Secondary | ICD-10-CM

## 2014-03-15 DIAGNOSIS — D5720 Sickle-cell/Hb-C disease without crisis: Secondary | ICD-10-CM

## 2014-03-15 DIAGNOSIS — E162 Hypoglycemia, unspecified: Secondary | ICD-10-CM

## 2014-03-15 LAB — COMPLETE METABOLIC PANEL WITH GFR
ALT: 9 U/L (ref 0–35)
AST: 19 U/L (ref 0–37)
Albumin: 4.3 g/dL (ref 3.5–5.2)
Alkaline Phosphatase: 39 U/L (ref 39–117)
BUN: 5 mg/dL — ABNORMAL LOW (ref 6–23)
CALCIUM: 9.3 mg/dL (ref 8.4–10.5)
CO2: 24 mEq/L (ref 19–32)
Chloride: 102 mEq/L (ref 96–112)
Creat: 0.54 mg/dL (ref 0.50–1.10)
GFR, Est African American: >89 mL/min
Glucose, Bld: 43 mg/dL — CL (ref 70–99)
Potassium: 4.4 mEq/L (ref 3.5–5.3)
Sodium: 140 mEq/L (ref 135–145)
Total Bilirubin: 0.7 mg/dL (ref 0.2–1.2)
Total Protein: 8.2 g/dL (ref 6.0–8.3)

## 2014-03-15 LAB — GLUCOSE, CAPILLARY: GLUCOSE-CAPILLARY: 103 mg/dL — AB (ref 70–99)

## 2014-03-15 LAB — VITAMIN D 25 HYDROXY (VIT D DEFICIENCY, FRACTURES): VIT D 25 HYDROXY: 12 ng/mL — AB (ref 30–89)

## 2014-03-15 NOTE — Patient Instructions (Signed)
Hypoglycemia °Hypoglycemia occurs when the glucose in your blood is too low. Glucose is a type of sugar that is your body's main energy source. Hormones, such as insulin and glucagon, control the level of glucose in the blood. Insulin lowers blood glucose and glucagon increases blood glucose. Having too much insulin in your blood stream, or not eating enough food containing sugar, can result in hypoglycemia. Hypoglycemia can happen to people with or without diabetes. It can develop quickly and can be a medical emergency.  °CAUSES  °· Missing or delaying meals. °· Not eating enough carbohydrates at meals. °· Taking too much diabetes medicine. °· Not timing your oral diabetes medicine or insulin doses with meals, snacks, and exercise. °· Nausea and vomiting. °· Certain medicines. °· Severe illnesses, such as hepatitis, kidney disorders, and certain eating disorders. °· Increased activity or exercise without eating something extra or adjusting medicines. °· Drinking too much alcohol. °· A nerve disorder that affects body functions like your heart rate, blood pressure, and digestion (autonomic neuropathy). °· A condition where the stomach muscles do not function properly (gastroparesis). Therefore, medicines and food may not absorb properly. °· Rarely, a tumor of the pancreas can produce too much insulin. °SYMPTOMS  °· Hunger. °· Sweating (diaphoresis). °· Change in body temperature. °· Shakiness. °· Headache. °· Anxiety. °· Lightheadedness. °· Irritability. °· Difficulty concentrating. °· Dry mouth. °· Tingling or numbness in the hands or feet. °· Restless sleep or sleep disturbances. °· Altered speech and coordination. °· Change in mental status. °· Seizures or prolonged convulsions. °· Combativeness. °· Drowsiness (lethargic). °· Weakness. °· Increased heart rate or palpitations. °· Confusion. °· Pale, gray skin color. °· Blurred or double vision. °· Fainting. °DIAGNOSIS  °A physical exam and medical history will be  performed. Your caregiver may make a diagnosis based on your symptoms. Blood tests and other lab tests may be performed to confirm a diagnosis. Once the diagnosis is made, your caregiver will see if your signs and symptoms go away once your blood glucose is raised.  °TREATMENT  °Usually, you can easily treat your hypoglycemia when you notice symptoms. °· Check your blood glucose. If it is less than 70 mg/dl, take one of the following:   °¨ 3-4 glucose tablets.   °¨ ½ cup juice.   °¨ ½ cup regular soda.   °¨ 1 cup skim milk.   °¨ ½-1 tube of glucose gel.   °¨ 5-6 hard candies.   °· Avoid high-fat drinks or food that may delay a rise in blood glucose levels. °· Do not take more than the recommended amount of sugary foods, drinks, gel, or tablets. Doing so will cause your blood glucose to go too high.   °· Wait 10-15 minutes and recheck your blood glucose. If it is still less than 70 mg/dl or below your target range, repeat treatment.   °· Eat a snack if it is more than 1 hour until your next meal.   °There may be a time when your blood glucose may go so low that you are unable to treat yourself at home when you start to notice symptoms. You may need someone to help you. You may even faint or be unable to swallow. If you cannot treat yourself, someone will need to bring you to the hospital.  °HOME CARE INSTRUCTIONS °· If you have diabetes, follow your diabetes management plan by: °¨ Taking your medicines as directed. °¨ Following your exercise plan. °¨ Following your meal plan. Do not skip meals. Eat on time. °¨ Testing your blood   glucose regularly. Check your blood glucose before and after exercise. If you exercise longer or different than usual, be sure to check blood glucose more frequently. °¨ Wearing your medical alert jewelry that says you have diabetes. °· Identify the cause of your hypoglycemia. Then, develop ways to prevent the recurrence of hypoglycemia. °· Do not take a hot bath or shower right after an  insulin shot. °· Always carry treatment with you. Glucose tablets are the easiest to carry. °· If you are going to drink alcohol, drink it only with meals. °· Tell friends or family members ways to keep you safe during a seizure. This may include removing hard or sharp objects from the area or turning you on your side. °· Maintain a healthy weight. °SEEK MEDICAL CARE IF:  °· You are having problems keeping your blood glucose in your target range. °· You are having frequent episodes of hypoglycemia. °· You feel you might be having side effects from your medicines. °· You are not sure why your blood glucose is dropping so low. °· You notice a change in vision or a new problem with your vision. °SEEK IMMEDIATE MEDICAL CARE IF:  °· Confusion develops. °· A change in mental status occurs. °· The inability to swallow develops. °· Fainting occurs. °Document Released: 06/08/2005 Document Revised: 06/13/2013 Document Reviewed: 10/05/2011 °ExitCare® Patient Information ©2015 [employer]. This information is not intended to replace advice given to you by your health care provider. Make sure you discuss any questions you have with your health care provider. ° °

## 2014-03-15 NOTE — Telephone Encounter (Signed)
Received call from [facility] :30am that patient had a critical glucose of 43. Called and notified Julianne Handler, FNP :40am, she gave verbal instructions to call and she how patient is feeling and have her come in today for evaluation of hypoglycemia. I called patient at 8:42am, patient states she is feeling fine but will come in today at 2 pm to be evaluated. Thanks!

## 2014-03-15 NOTE — Progress Notes (Signed)
   Subjective:    Patient ID: Makayla Fletcher, female    DOB: 15-Jul-1995, 18 y.o.   MRN: [id number]  HPI Patient with a history of sickle cell anemia, HbSC presents for an episode of hypoglycemia. Patient was in the office on 9/21 for a routine check of sickle cell anemia and was found to have a critical glucose level of 43. Makayla Fletcher was notified via telephone. 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 She states that she often skips meals often and walks most places. She currently denies dizziness hunger, shakiness, nervousness, sweating, weakness or confusion. She also denies adding OTC diet or supplemental medications.    Review of Systems  Constitutional: Negative.   Respiratory: Negative.   Cardiovascular: Negative.   Gastrointestinal: Negative.  Negative for nausea, diarrhea and rectal pain.  Endocrine: Negative.   Genitourinary: Negative.   Musculoskeletal: Negative.   Skin: Negative.   Allergic/Immunologic: Negative.   Neurological: Positive for dizziness (occasionally) and headaches (occasionally).  Hematological: Negative.   Psychiatric/Behavioral: Negative.        Objective:   Physical Exam  Constitutional: She is oriented to person, place, and time. She appears well-developed and well-nourished.  HENT:  Head: Normocephalic and atraumatic.  Right Ear: External ear normal.  Left Ear: External ear normal.  Mouth/Throat: Oropharynx is clear and moist.  Eyes: Conjunctivae and EOM are normal. Pupils are equal, round, and reactive to light.  Neck: Normal range of motion. Neck supple.  Cardiovascular: Normal rate, regular rhythm and normal heart sounds.   Pulmonary/Chest: Effort normal and breath sounds normal.  Abdominal: Soft. Bowel sounds are normal.  Musculoskeletal: Normal range of motion.  Neurological: She is alert and oriented to person, place, and time. She has normal reflexes.  Skin: Skin is warm  and dry.  Psychiatric: She has a normal mood and affect. Her behavior is normal. Judgment and thought content normal.      BP 114/77  Pulse 101  Temp(Src) 98.3 F (36.8 C) (Oral)  Resp 18  Ht  (1.575 m)  Wt 118 lb (53.524 kg)  BMI 21.58 kg/m2  LMP 02/20/2014    Assessment & Plan:  1. Hypoglycemia -Explained to patient that hypoglycemia occurs when blood glucose drops lower than normal levels. Discussed diet at length and the fact that she will need to eat a balanced diet in order to have adequate sources of energy for body functioning. Discussed complex and simple carbohydrates. Makayla Fletcher and I discussed the signs and symptoms of hypoglycemia. She was given written materials on hypoglycemia and expressed understanding.  - Glucose (CBG) - Hemoglobin A1c - Urinalysis, Complete  2. 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   3. Dizziness Patient reports occasional dizziness. She currently denies headache, fever, weakness, nausea, vomiting or diarrhea.    RTC: 1 month or as needed Massie Maroon, FNP

## 2014-03-16 LAB — URINALYSIS, COMPLETE
Bilirubin Urine: NEGATIVE
Casts: NONE SEEN
Crystals: NONE SEEN
Glucose, UA: NEGATIVE mg/dL
HGB URINE DIPSTICK: NEGATIVE
KETONES UR: NEGATIVE mg/dL
Leukocytes, UA: NEGATIVE
NITRITE: NEGATIVE
Protein, ur: NEGATIVE mg/dL
Specific Gravity, Urine: 1.006 (ref 1.005–1.030)
Urobilinogen, UA: 0.2 mg/dL (ref 0.0–1.0)
pH: 7 (ref 5.0–8.0)

## 2014-03-16 LAB — HEMOGLOBIN A1C
Hgb A1c MFr Bld: 4 % (ref <5.7)
MEAN PLASMA GLUCOSE: 68 mg/dL (ref <117)

## 2014-03-20 ENCOUNTER — Encounter: Payer: Self-pay | Admitting: Family Medicine

## 2014-03-20 ENCOUNTER — Ambulatory Visit (HOSPITAL_COMMUNITY): Admission: RE | Admit: 2014-03-20 | Payer: Medicaid Other | Source: Ambulatory Visit

## 2014-03-20 DIAGNOSIS — E162 Hypoglycemia, unspecified: Secondary | ICD-10-CM | POA: Insufficient documentation

## 2014-03-21 ENCOUNTER — Telehealth: Payer: Self-pay | Admitting: Internal Medicine

## 2014-03-21 NOTE — Telephone Encounter (Signed)
Patient needs a new referral for echo. Patient missed last appointment.

## 2014-03-21 NOTE — Telephone Encounter (Signed)
Left a message for patient to call scheduling to re-schedule echo appointment.

## 2014-03-29 ENCOUNTER — Ambulatory Visit (HOSPITAL_COMMUNITY)
Admission: RE | Admit: 2014-03-29 | Discharge: 2014-03-29 | Disposition: A | Discharge status: Home / Self Care | Payer: Medicaid Other | Source: Ambulatory Visit | Attending: Family Medicine | Admitting: Family Medicine

## 2014-03-29 DIAGNOSIS — R011 Cardiac murmur, unspecified: Secondary | ICD-10-CM | POA: Insufficient documentation

## 2014-03-29 DIAGNOSIS — D5720 Sickle-cell/Hb-C disease without crisis: Secondary | ICD-10-CM | POA: Insufficient documentation

## 2014-03-29 DIAGNOSIS — R9431 Abnormal electrocardiogram [ECG] [EKG]: Secondary | ICD-10-CM

## 2014-03-29 DIAGNOSIS — R010 Benign and innocent cardiac murmurs: Secondary | ICD-10-CM

## 2014-03-29 DIAGNOSIS — I499 Cardiac arrhythmia, unspecified: Secondary | ICD-10-CM

## 2014-03-29 NOTE — Progress Notes (Signed)
Echocardiogram 2D Echocardiogram has been performed.  Makayla Fletcher 03/29/2014, 12:01 PM

## 2014-04-13 ENCOUNTER — Ambulatory Visit: Payer: Medicaid Other | Admitting: Family Medicine

## 2014-04-24 ENCOUNTER — Encounter: Payer: Self-pay | Admitting: Family Medicine

## 2014-04-24 ENCOUNTER — Ambulatory Visit (INDEPENDENT_AMBULATORY_CARE_PROVIDER_SITE_OTHER): Payer: Medicaid Other | Admitting: Family Medicine

## 2014-04-24 VITALS — BP 112/73 | HR 111 | Temp 98.2°F | Resp 14 | Ht 62.0 in | Wt 116.0 lb

## 2014-04-24 DIAGNOSIS — I499 Cardiac arrhythmia, unspecified: Secondary | ICD-10-CM

## 2014-04-24 DIAGNOSIS — E559 Vitamin D deficiency, unspecified: Secondary | ICD-10-CM

## 2014-04-24 DIAGNOSIS — D5720 Sickle-cell/Hb-C disease without crisis: Secondary | ICD-10-CM

## 2014-04-24 DIAGNOSIS — Z23 Encounter for immunization: Secondary | ICD-10-CM | POA: Diagnosis not present

## 2014-04-24 MED ORDER — ERGOCALCIFEROL 1.25 MG (50000 UT) PO CAPS: 50000.0000 [IU] | ORAL_CAPSULE | ORAL | Status: DC

## 2014-04-24 NOTE — Progress Notes (Signed)
   Subjective:    Patient ID: Makayla Fletcher, female    DOB: 27-Dec-1995, 18 y.o.   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 She reports that she feels well without current complaint. She reports that she has not required opiate medication since last summer. She states that periodic pain is typically controlled with OTC pain relievers and Ibuprofen. She denies headache, chest pains, shortness of breath, heart palpitations, nausea, vomiting, or diarrhea.   Past Medical History  Diagnosis Date  . Sickle cell anemia   . Urinary tract infection July 2013    frequent   . Depression   . Migraine without aura, without mention of intractable migraine without mention of status migrainosus     Review of Systems  Constitutional: Negative.   HENT: Negative.   Eyes: Negative.   Respiratory: Negative.   Cardiovascular: Negative.   Gastrointestinal: Negative.   Endocrine: Negative.   Genitourinary: Negative.   Musculoskeletal: Negative.   Skin: Negative.   Allergic/Immunologic: Negative.   Neurological: Negative.   Hematological: Negative.   Psychiatric/Behavioral: Negative.        Objective:   Physical Exam  Constitutional: She is oriented to person, place, and time. She appears well-developed and well-nourished.  HENT:  Head: Normocephalic and atraumatic.  Right Ear: External ear normal.  Left Ear: External ear normal.  Mouth/Throat: Oropharynx is clear and moist.  Eyes: Conjunctivae and EOM are normal. Pupils are equal, round, and reactive to light.  Neck: Normal range of motion. Neck supple.  Cardiovascular: Normal heart sounds and intact distal pulses.  An irregular rhythm present.  Pulmonary/Chest: Effort normal and breath sounds normal.  Abdominal: Soft. Bowel sounds are normal.  Musculoskeletal: Normal range of motion.  Neurological: She is alert and oriented to person, place, and  time. She has normal reflexes.  Skin: Skin is warm and dry.  Psychiatric: She has a normal mood and affect. Her behavior is normal. Judgment and thought content normal.         BP 112/73 mmHg  Pulse 111  Temp(Src) 98.2 F (36.8 C) (Oral)  Resp 14  Ht 5\' 2"  (1.575 m)  Wt 116 lb (52.617 kg)  BMI 21.21 kg/m2  LMP 04/20/2014 Assessment & Plan:  1. 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   Cardiac - Routine screening for pulmonary hypertension is not recommended.  Eye - High risk of proliferative retinopathy. Annual eye exam with retinal exam recommended to patient. Will send a referral, it has been greater than 1 year.   - CBC with Differential; Future - COMPLETE METABOLIC PANEL WITH GFR; Future  2. Immunization due - Pneumococcal polysaccharide vaccine 23-valent greater than or equal to 2yo subcutaneous/IM  3. Irregular heartbeat Reviewed recent 2 D Echo results. Patient currently denies SOB, CP, palpitations, weakness, or dizziness.   4. Vitamin D deficiency Reviewed previous vitamin D results, level is 15, will start weekly Drisdol. - ergocalciferol (DRISDOL) 50000 UNITS capsule; Take 1 capsule (50,000 Units total) by mouth once a week.  Dispense: 4 capsule; Refill: 12   Janis Sol M, FNP

## 2014-04-24 NOTE — Patient Instructions (Addendum)
Sickle Cell Anemia, Adult °Sickle cell anemia is a condition in which red blood cells have an abnormal "sickle" shape. This abnormal shape shortens the cells' life span, which results in a lower than normal concentration of red blood cells in the blood. The sickle shape also causes the cells to clump together and block free blood flow through the blood vessels. As a result, the tissues and organs of the body do not receive enough oxygen. Sickle cell anemia causes organ damage and pain and increases the risk of infection. °CAUSES  °Sickle cell anemia is a genetic disorder. Those who receive two copies of the gene have the condition, and those who receive one copy have the trait. °RISK FACTORS °The sickle cell gene is most common in people whose families originated in [place]. Other areas of the globe where sickle cell trait occurs include the Mediterranean, [place] and [place], the [place], and the [place].  °SIGNS AND SYMPTOMS °· Pain, especially in the extremities, back, chest, or abdomen (common). The pain may start suddenly or may develop following an illness, especially if there is dehydration. Pain can also occur due to overexertion or exposure to extreme temperature changes. °· Frequent severe bacterial infections, especially certain types of pneumonia and meningitis. °· Pain and swelling in the hands and feet. °· Decreased activity.   °· Loss of appetite.   °· Change in behavior. °· Headaches. °· Seizures. °· Shortness of breath or difficulty breathing. °· Vision changes. °· Skin ulcers. °Those with the trait may not have symptoms or they may have mild symptoms.  °DIAGNOSIS  °Sickle cell anemia is diagnosed with blood tests that demonstrate the genetic trait. It is often diagnosed during the newborn period, due to mandatory testing nationwide. A variety of blood tests, X-rays, CT scans, MRI scans, ultrasounds, and lung function tests may also be done to monitor the condition. °TREATMENT  °Sickle  cell anemia may be treated with: °· Medicines. You may be given pain medicines, antibiotic medicines (to treat and prevent infections) or medicines to increase the production of certain types of hemoglobin. °· Fluids. °· Oxygen. °· Blood transfusions. °HOME CARE INSTRUCTIONS  °· Drink enough fluid to keep your urine clear or pale yellow. Increase your fluid intake in hot weather and during exercise. °· Do not smoke. Smoking lowers oxygen levels in the blood.   °· Only take over-the-counter or prescription medicines for pain, fever, or discomfort as directed by your health care provider. °· Take antibiotics as directed by your health care provider. Make sure you finish them it even if you start to feel better.   °· Take supplements as directed by your health care provider.   °· Consider wearing a medical alert bracelet. This tells anyone caring for you in an emergency of your condition.   °· When traveling, keep your medical information, health care provider's names, and the medicines you take with you at all times.   °· If you develop a fever, do not take medicines to reduce the fever right away. This could cover up a problem that is developing. Notify your health care provider. °· Keep all follow-up appointments with your health care provider. Sickle cell anemia requires regular medical care. °SEEK MEDICAL CARE IF: ° You have a fever. °SEEK IMMEDIATE MEDICAL CARE IF:  °· You feel dizzy or faint.   °· You have new abdominal pain, especially on the left side near the stomach area.   °· You develop a persistent, often uncomfortable and painful penile erection (priapism). If this is not treated immediately it   will lead to impotence.   °· You have numbness your arms or legs or you have a hard time moving them.   °· You have a hard time with speech.   °· You have a fever or persistent symptoms for more than 2-3 days.   °· You have a fever and your symptoms suddenly get worse.   °· You have signs or symptoms of infection.  These include:   °¨ Chills.   °¨ Abnormal tiredness (lethargy).   °¨ Irritability.   °¨ Poor eating.   °¨ Vomiting.   °· You develop pain that is not helped with medicine.   °· You develop shortness of breath. °· You have pain in your chest.   °· You are coughing up pus-like or bloody sputum.   °· You develop a stiff neck. °· Your feet or hands swell or have pain. °· Your abdomen appears bloated. °· You develop joint pain. °MAKE SURE YOU: °· Understand these instructions. °Document Released: 09/16/2005 Document Revised: 10/23/2013 Document Reviewed: 01/18/2013 °ExitCare® Patient Information ©2015 [employer]. This information is not intended to replace advice given to you by your health care provider. Make sure you discuss any questions you have with your health care provider. ° °

## 2014-05-02 ENCOUNTER — Telehealth: Payer: Self-pay | Admitting: Hematology

## 2014-05-02 NOTE — Telephone Encounter (Signed)
Patient called in C/O sharp chest pain that starts in middle of her chest and radiates to back should blades, that started last night.  Patient rates this pain 8/10 on pain scale.  Patient denies shortness of breath or jaw, neck or arm pain.  Per patient, this pain is different from the pain she experienced in the past with her sickle cell crisis.  I verbally discussed with Armeniahina Hollis, NP, and patient instructed to go to emergency room.  Patient verbalizes understanding.

## 2014-06-12 ENCOUNTER — Ambulatory Visit: Payer: Medicaid Other | Admitting: Family Medicine

## 2014-07-17 ENCOUNTER — Other Ambulatory Visit: Payer: Medicaid Other

## 2014-07-24 ENCOUNTER — Ambulatory Visit: Payer: Medicaid Other | Admitting: Family Medicine

## 2014-08-08 ENCOUNTER — Ambulatory Visit (INDEPENDENT_AMBULATORY_CARE_PROVIDER_SITE_OTHER): Payer: Medicaid Other | Admitting: Family Medicine

## 2014-08-08 ENCOUNTER — Encounter: Payer: Self-pay | Admitting: Family Medicine

## 2014-08-08 VITALS — BP 110/68 | HR 95 | Temp 98.3°F | Resp 16 | Ht 62.0 in | Wt 119.0 lb

## 2014-08-08 DIAGNOSIS — Z111 Encounter for screening for respiratory tuberculosis: Secondary | ICD-10-CM

## 2014-08-08 DIAGNOSIS — Z0000 Encounter for general adult medical examination without abnormal findings: Secondary | ICD-10-CM

## 2014-08-08 DIAGNOSIS — D5720 Sickle-cell/Hb-C disease without crisis: Secondary | ICD-10-CM

## 2014-08-08 LAB — CBC WITH DIFFERENTIAL/PLATELET
Basophils Absolute: 0 10*3/uL (ref 0.0–0.1)
Basophils Relative: 0 % (ref 0–1)
EOS ABS: 0.2 10*3/uL (ref 0.0–0.7)
Eosinophils Relative: 3 % (ref 0–5)
HCT: 34.7 % — ABNORMAL LOW (ref 36.0–46.0)
Hemoglobin: 11.7 g/dL — ABNORMAL LOW (ref 12.0–15.0)
LYMPHS PCT: 32 % (ref 12–46)
Lymphs Abs: 2.1 10*3/uL (ref 0.7–4.0)
MCH: 30.7 pg (ref 26.0–34.0)
MCHC: 33.7 g/dL (ref 30.0–36.0)
MCV: 91.1 fL (ref 78.0–100.0)
MONO ABS: 0.5 10*3/uL (ref 0.1–1.0)
MPV: 11.7 fL (ref 8.6–12.4)
Monocytes Relative: 7 % (ref 3–12)
NEUTROS PCT: 58 % (ref 43–77)
Neutro Abs: 3.9 10*3/uL (ref 1.7–7.7)
Platelets: 123 10*3/uL — ABNORMAL LOW (ref 150–400)
RBC: 3.81 MIL/uL — ABNORMAL LOW (ref 3.87–5.11)
RDW: 13.9 % (ref 11.5–15.5)
WBC: 6.7 10*3/uL (ref 4.0–10.5)

## 2014-08-08 NOTE — Progress Notes (Signed)
Subjective:    Patient ID: Makayla Fletcher, female    DOB: 1995/07/03, 19 y.o.   MRN: [id number]  HPI  [name] a 19 year old female with a history of sickle cell anemia, HbSC presents for a 6 month follow up of sickle cell anemia. She state that she generally feels well and is without current complaint. Ms. Hartzog maintains that she has been taking medications consistently. She currently denies headache, fatigue, chest pains, shortness of breath, nausea, vomiting, or diarrhea.   Past Medical History  Diagnosis Date  . Sickle cell anemia   . Urinary tract infection July 2013    frequent   . Depression   . Migraine without aura, without mention of intractable migraine without mention of status migrainosus    History   Social History  . Marital Status: Single    Spouse Name: N/A  . Number of Children: 0  . Years of Education: college   Occupational History  . MINOR   . 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 Smoking status: Never Smoker   . Smokeless tobacco: Never Used  . Alcohol Use: No  . Drug Use: No  . Sexual Activity:    Partners: Male    Birth Control/ Protection: Pill     Comment: Depoprovera    Other Topics Concern  . Not on file   Social History Narrative    Review of Systems  Constitutional: Negative.  Negative for fever, fatigue and unexpected weight change.  HENT: Negative.   Eyes: Negative.   Respiratory: Negative.   Cardiovascular: Negative.   Gastrointestinal: Negative.   Endocrine: Negative.   Genitourinary: Negative.   Musculoskeletal: Negative.  Negative for myalgias and neck pain.  Skin: Negative.   Allergic/Immunologic: Negative.   Neurological: Negative.   Hematological: Negative.   Psychiatric/Behavioral: Negative.  Negative for suicidal ideas and sleep disturbance.       Objective:   Physical Exam  Constitutional: She is oriented to person, place, and time. She  appears well-developed and well-nourished.  HENT:  Head: Normocephalic and atraumatic.  Right Ear: External ear normal.  Left Ear: External ear normal.  Mouth/Throat: Oropharynx is clear and moist.  Eyes: Conjunctivae are normal. Pupils are equal, round, and reactive to light.  Neck: Normal range of motion. Neck supple.  Cardiovascular: Normal rate, regular rhythm, normal heart sounds and intact distal pulses.   Musculoskeletal: Normal range of motion.  Neurological: She is alert and oriented to person, place, and time. She has normal reflexes.  Skin: Skin is warm and dry.  Psychiatric: She has a normal mood and affect. Her speech is normal and behavior is normal. Judgment and thought content normal. Cognition and memory are normal.   BP 110/68 mmHg  Pulse 95  Temp(Src) 98.3 F (36.8 C) (Oral)  Resp 16  Ht  (1.575 m)  Wt 119 lb (53.978 kg)  BMI 21.76 kg/m2  LMP 07/11/2014       Assessment & Plan:  1. 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   Pulmonary evaluation - Patient denies severe recurrent wheezes, shortness of breath with exercise, or persistent cough. If these symptoms develop, pulmonary function tests with spirometry will be ordered, and if abnormal, plan on referral to Pulmonology for further evaluation.   Cardiac - Routine screening for pulmonary hypertension is not  recommended.  Eye - High risk of proliferative retinopathy. Annual eye exam with retinal exam recommended to patient.   Immunization status - Patient is UTD on vaccinations   Vitamin D deficiency - Drisdol 50,000 units weekly, we encouraged her to take it.   The above recommendations are taken from the NIH Evidence-Based Management of Sickle Cell Disease: Expert Panel Report, [id number].   - CBC with Differential - COMPLETE METABOLIC PANEL WITH GFR  2. Visit for TB skin test - Quantiferon tb gold assay   3. Annual  physical exam Future labs ordered in anticipation of complete physical examination.  - Lipid Panel; Future - TSH; Future - Urinalysis, Complete; Future - CBC with Differential; Future - COMPLETE METABOLIC PANEL WITH GFR; Future   RTC: 6 months for CPE  Massie MaroonHollis,Lachina M, FNP

## 2014-08-09 LAB — COMPLETE METABOLIC PANEL WITH GFR
ALT: <8 U/L (ref 0–35)
AST: 16 U/L (ref 0–37)
Albumin: 4.1 g/dL (ref 3.5–5.2)
Alkaline Phosphatase: 38 U/L — ABNORMAL LOW (ref 39–117)
BUN: 5 mg/dL — ABNORMAL LOW (ref 6–23)
CO2: 28 mEq/L (ref 19–32)
Calcium: 9 mg/dL (ref 8.4–10.5)
Chloride: 104 mEq/L (ref 96–112)
Creat: 0.56 mg/dL (ref 0.50–1.10)
GFR, Est Non African American: >89 mL/min
GLUCOSE: 73 mg/dL (ref 70–99)
Potassium: 3.9 mEq/L (ref 3.5–5.3)
Sodium: 138 mEq/L (ref 135–145)
TOTAL PROTEIN: 7.6 g/dL (ref 6.0–8.3)
Total Bilirubin: 0.9 mg/dL (ref 0.2–1.2)

## 2014-08-11 LAB — QUANTIFERON TB GOLD ASSAY (BLOOD)
INTERFERON GAMMA RELEASE ASSAY: NEGATIVE
MITOGEN VALUE: 5.86 [IU]/mL
QUANTIFERON TB AG MINUS NIL: 0 [IU]/mL
Quantiferon Nil Value: 0.02 IU/mL
TB AG VALUE: 0.02 [IU]/mL

## 2014-08-14 ENCOUNTER — Ambulatory Visit: Payer: Medicaid Other | Admitting: Family Medicine

## 2014-09-21 ENCOUNTER — Other Ambulatory Visit (INDEPENDENT_AMBULATORY_CARE_PROVIDER_SITE_OTHER): Payer: Self-pay

## 2014-09-21 ENCOUNTER — Other Ambulatory Visit: Payer: Self-pay | Admitting: Family Medicine

## 2014-09-21 DIAGNOSIS — Z111 Encounter for screening for respiratory tuberculosis: Secondary | ICD-10-CM

## 2014-09-23 LAB — QUANTIFERON TB GOLD ASSAY (BLOOD)
Interferon Gamma Release Assay: NEGATIVE
MITOGEN VALUE: 1.28 [IU]/mL
QUANTIFERON NIL VALUE: 0.01 [IU]/mL
QUANTIFERON TB AG MINUS NIL: 0.02 [IU]/mL
TB AG VALUE: 0.03 [IU]/mL

## 2014-11-28 ENCOUNTER — Telehealth: Payer: Self-pay | Admitting: Internal Medicine

## 2014-11-28 NOTE — Telephone Encounter (Signed)
Called and left message for patient to return call regarding eye issue.

## 2014-11-28 NOTE — Telephone Encounter (Signed)
Patient called regarding issue with her eye.

## 2015-02-08 ENCOUNTER — Ambulatory Visit: Payer: Medicaid Other | Admitting: Family Medicine

## 2015-02-15 ENCOUNTER — Other Ambulatory Visit: Payer: Self-pay | Admitting: Internal Medicine

## 2015-03-23 ENCOUNTER — Emergency Department (HOSPITAL_COMMUNITY)
Admission: EM | Admit: 2015-03-23 | Discharge: 2015-03-23 | Disposition: A | Discharge status: Home / Self Care | Payer: Medicaid Other | Attending: Emergency Medicine | Admitting: Emergency Medicine

## 2015-03-23 ENCOUNTER — Encounter (HOSPITAL_COMMUNITY): Payer: Self-pay | Admitting: Emergency Medicine

## 2015-03-23 DIAGNOSIS — D5700 Hb-SS disease with crisis, unspecified: Secondary | ICD-10-CM

## 2015-03-23 DIAGNOSIS — Z8669 Personal history of other diseases of the nervous system and sense organs: Secondary | ICD-10-CM | POA: Insufficient documentation

## 2015-03-23 DIAGNOSIS — Z87440 Personal history of urinary (tract) infections: Secondary | ICD-10-CM | POA: Insufficient documentation

## 2015-03-23 DIAGNOSIS — Z79899 Other long term (current) drug therapy: Secondary | ICD-10-CM | POA: Insufficient documentation

## 2015-03-23 DIAGNOSIS — Z8659 Personal history of other mental and behavioral disorders: Secondary | ICD-10-CM | POA: Insufficient documentation

## 2015-03-23 LAB — CBC WITH DIFFERENTIAL/PLATELET
BASOS ABS: 0 10*3/uL (ref 0.0–0.1)
Basophils Relative: 0 %
EOS ABS: 0.2 10*3/uL (ref 0.0–0.7)
Eosinophils Relative: 2 %
HCT: 27.5 % — ABNORMAL LOW (ref 36.0–46.0)
HEMOGLOBIN: 9.9 g/dL — AB (ref 12.0–15.0)
LYMPHS PCT: 16 %
Lymphs Abs: 1.3 10*3/uL (ref 0.7–4.0)
MCH: 30.1 pg (ref 26.0–34.0)
MCHC: 36 g/dL (ref 30.0–36.0)
MCV: 83.6 fL (ref 78.0–100.0)
MONO ABS: 0.6 10*3/uL (ref 0.1–1.0)
Monocytes Relative: 7 %
NEUTROS ABS: 6.2 10*3/uL (ref 1.7–7.7)
NEUTROS PCT: 75 %
PLATELETS: 93 10*3/uL — AB (ref 150–400)
RBC: 3.29 MIL/uL — ABNORMAL LOW (ref 3.87–5.11)
RDW: 13.9 % (ref 11.5–15.5)
WBC: 8.3 10*3/uL (ref 4.0–10.5)

## 2015-03-23 LAB — BASIC METABOLIC PANEL
ANION GAP: 10 (ref 5–15)
BUN: 8 mg/dL (ref 6–20)
CALCIUM: 9.5 mg/dL (ref 8.9–10.3)
CO2: 24 mmol/L (ref 22–32)
CREATININE: 0.55 mg/dL (ref 0.44–1.00)
Chloride: 105 mmol/L (ref 101–111)
Glucose, Bld: 90 mg/dL (ref 65–99)
Potassium: 3.6 mmol/L (ref 3.5–5.1)
Sodium: 139 mmol/L (ref 135–145)

## 2015-03-23 LAB — RETICULOCYTES
RBC.: 3.29 MIL/uL — AB (ref 3.87–5.11)
RETIC CT PCT: 2.8 % (ref 0.4–3.1)
Retic Count, Absolute: 92.1 10*3/uL (ref 19.0–186.0)

## 2015-03-23 MED ORDER — HYDROMORPHONE HCL 1 MG/ML IJ SOLN
1.0000 mg | Freq: Once | INTRAMUSCULAR | Status: AC
  Administered 2015-03-23: 1 mg via INTRAVENOUS
  Filled 2015-03-23: qty 1

## 2015-03-23 MED ORDER — ONDANSETRON HCL 4 MG/2ML IJ SOLN
4.0000 mg | Freq: Once | INTRAMUSCULAR | Status: AC
  Administered 2015-03-23: 4 mg via INTRAVENOUS
  Filled 2015-03-23: qty 2

## 2015-03-23 MED ORDER — KETOROLAC TROMETHAMINE 30 MG/ML IJ SOLN
30.0000 mg | Freq: Once | INTRAMUSCULAR | Status: AC
  Administered 2015-03-23: 30 mg via INTRAVENOUS
  Filled 2015-03-23: qty 1

## 2015-03-23 MED ORDER — SODIUM CHLORIDE 0.9 % IV BOLUS (SEPSIS)
1000.0000 mL | Freq: Once | INTRAVENOUS | Status: AC
  Administered 2015-03-23: 1000 mL via INTRAVENOUS

## 2015-03-23 NOTE — ED Notes (Signed)
Pt from home c/o right leg pain after working out at the gym. Pt HX of sickle cell. She reports did not take medication at home for pain due to drinking alcohol tonight. She reports that she normally take tylenol or ibuprofen for pain relief at home.

## 2015-03-23 NOTE — Discharge Instructions (Signed)
Follow up with your doctor for further evaluation. Refer to attached documents for more information.  °

## 2015-03-23 NOTE — ED Notes (Signed)
Nurse will collect labs while starting the pt iv.

## 2015-03-23 NOTE — ED Provider Notes (Signed)
CSN: [id number]     Arrival date & time 03/23/15  0344 History   First MD Initiated Contact with Patient 03/23/15 [phone]     Chief Complaint  Patient presents with  . Sickle Cell Pain Crisis     (Consider location/radiation/quality/duration/timing/severity/associated sxs/prior Treatment) Patient is a 19 y.o. female presenting with sickle cell pain. The history is provided by the patient. No language interpreter was used.  Sickle Cell Pain Crisis Location:  Lower extremity Severity:  Severe Onset quality:  Gradual Duration:  5 hours Similar to previous crisis episodes: yes   Timing:  Constant Progression:  Worsening Chronicity:  Recurrent Sickle cell genotype:  SS History of pulmonary emboli: no   Context: alcohol consumption   Context: not change in medication, not cold exposure, not dehydration, not infection, not non-compliance, not low humidity, not menses, not pregnancy and not stress   Context comment:  Patient reports working out earlier this evening Relieved by:  Nothing Worsened by:  Nothing tried Ineffective treatments:  Rest Associated symptoms: no chest pain, no congestion, no cough, no fatigue, no fever, no headaches, no leg ulcers, no nausea, no priapism, no sore throat, no swelling of legs, no vision change and no vomiting   Risk factors: exertion   Risk factors: no cholecystectomy, no elevation change, no frequent admissions for fever, no frequent admissions for pain, no frequent pain crises, no hx of pneumonia, no hx of stroke, no lack of social support, no history of acute chest syndrome, no recent air travel, no renal disease and not smoking     Past Medical History  Diagnosis Date  . Sickle cell anemia (HCC)   . Urinary tract infection July 2013    frequent   . Depression   . Migraine without aura, without mention of intractable migraine without mention of status migrainosus    Past Surgical History  Procedure Laterality Date  . No past surgeries      Family History  Problem Relation Age of Onset  . Migraines Mother    Social History  Substance Use Topics  . Smoking status: Never Smoker   . Smokeless tobacco: Never Used  . Alcohol Use: No   OB History    No data available     Review of Systems  Constitutional: Negative for fever and fatigue.  HENT: Negative for congestion and sore throat.   Respiratory: Negative for cough.   Cardiovascular: Negative for chest pain.  Gastrointestinal: Negative for nausea and vomiting.  Musculoskeletal: Positive for myalgias.  Neurological: Negative for headaches.  All other systems reviewed and are negative.     Allergies  Review of patient's allergies indicates no known allergies.  Home Medications   Prior to Admission medications   Medication Sig Start Date End Date Taking? Authorizing Provider  acetaminophen (TYLENOL) 500 MG tablet Take 1,000 mg by mouth every 6 (six) hours as needed (for pain.).   Yes Historical Provider, MD  ergocalciferol (DRISDOL) 50000 UNITS capsule Take 1 capsule (50,000 Units total) by mouth once a week. 04/24/14  Yes Massie Maroon, FNP  folic acid (FOLVITE) 1 MG tablet TAKE 1 TABLET (1 MG TOTAL) BY MOUTH DAILY. 02/15/15  Yes Henrietta Hoover, NP  ibuprofen (ADVIL,MOTRIN) 200 MG tablet Take 400 mg by mouth daily as needed (pain).    Yes Historical Provider, MD  oxyCODONE (ROXICODONE) 5 MG immediate release tablet Take 1 tablet (5 mg total) by mouth every 6 (six) hours as needed for severe pain. 11/22/13  Yes Marcelino Duster  A Ashley Royalty, MD   BP 133/81 mmHg  Pulse 120  Temp(Src) 98.1 F (36.7 C) (Oral)  Resp 16  Wt 120 lb (54.432 kg)  SpO2 98%  LMP 03/19/2015 Physical Exam  Constitutional: She is oriented to person, place, and time. She appears well-developed and well-nourished. No distress.  HENT:  Head: Normocephalic and atraumatic.  Eyes: Conjunctivae and EOM are normal.  Neck: Normal range of motion.  Cardiovascular: Normal rate and regular rhythm.   Exam reveals no gallop and no friction rub.   No murmur heard. Pulmonary/Chest: Effort normal and breath sounds normal. She has no wheezes. She has no rales. She exhibits no tenderness.  Abdominal: Soft. She exhibits no distension. There is no tenderness. There is no rebound.  Musculoskeletal: Normal range of motion.  Right lower leg generalized tenderness to palpation. No associated warmth or redness noted.   Neurological: She is alert and oriented to person, place, and time. Coordination normal.  Speech is goal-oriented. Moves limbs without ataxia.   Skin: Skin is warm and dry.  Psychiatric: She has a normal mood and affect. Her behavior is normal.  Nursing note and vitals reviewed.   ED Course  Procedures (including critical care time) Labs Review Labs Reviewed  CBC WITH DIFFERENTIAL/PLATELET - Abnormal; Notable for the following:    RBC 3.29 (*)    Hemoglobin 9.9 (*)    HCT 27.5 (*)    Platelets 93 (*)    All other components within normal limits  RETICULOCYTES - Abnormal; Notable for the following:    RBC. 3.29 (*)    All other components within normal limits  BASIC METABOLIC PANEL  CBC WITH DIFFERENTIAL/PLATELET    Imaging Review No results found. I have personally reviewed and evaluated these images and lab results as part of my medical decision-making.   EKG Interpretation None      MDM   Final diagnoses:  Sickle-cell crisis (HCC)   4:16 AM Labs pending. Patient tachycardic with remaining vitals stable. Patient will have fluids, toradol, and dilaudid for pain. Patient denies chest pain or SOB at this time.   5:42 AM Patient's labs show mild drop in hemoglobin without other acute changes. Patient reports much improvement of her pain. Patient will be discharged without further evaluation. Patient instructed to follow up with PCP and return to the ED with worsening or concerning symptoms.   Emilia Beck, PA-C 03/23/15 0549  Lorre Nick, MD 03/26/15  [phone]

## 2015-03-25 ENCOUNTER — Ambulatory Visit (INDEPENDENT_AMBULATORY_CARE_PROVIDER_SITE_OTHER): Payer: Self-pay | Admitting: Family Medicine

## 2015-03-25 ENCOUNTER — Telehealth: Payer: Self-pay | Admitting: *Deleted

## 2015-03-25 ENCOUNTER — Encounter: Payer: Self-pay | Admitting: Family Medicine

## 2015-03-25 ENCOUNTER — Non-Acute Institutional Stay (HOSPITAL_COMMUNITY)
Admission: AD | Admit: 2015-03-25 | Discharge: 2015-03-25 | Disposition: A | Discharge status: Home / Self Care | Payer: Self-pay | Source: Ambulatory Visit | Attending: Internal Medicine | Admitting: Internal Medicine

## 2015-03-25 ENCOUNTER — Telehealth (HOSPITAL_COMMUNITY): Payer: Self-pay | Admitting: *Deleted

## 2015-03-25 VITALS — BP 127/87 | HR 120 | Temp 98.2°F | Resp 16 | Ht 62.0 in | Wt 114.0 lb

## 2015-03-25 DIAGNOSIS — D5700 Hb-SS disease with crisis, unspecified: Secondary | ICD-10-CM | POA: Insufficient documentation

## 2015-03-25 DIAGNOSIS — Z79899 Other long term (current) drug therapy: Secondary | ICD-10-CM | POA: Insufficient documentation

## 2015-03-25 DIAGNOSIS — Z791 Long term (current) use of non-steroidal anti-inflammatories (NSAID): Secondary | ICD-10-CM | POA: Insufficient documentation

## 2015-03-25 LAB — COMPREHENSIVE METABOLIC PANEL
ALBUMIN: 4.5 g/dL (ref 3.5–5.0)
ALT: 13 U/L — ABNORMAL LOW (ref 14–54)
ANION GAP: 5 (ref 5–15)
AST: 20 U/L (ref 15–41)
Alkaline Phosphatase: 54 U/L (ref 38–126)
BUN: 6 mg/dL (ref 6–20)
CHLORIDE: 103 mmol/L (ref 101–111)
CO2: 28 mmol/L (ref 22–32)
Calcium: 9.4 mg/dL (ref 8.9–10.3)
Creatinine, Ser: 0.6 mg/dL (ref 0.44–1.00)
GFR calc Af Amer: >60 mL/min (ref >60)
GFR calc non Af Amer: >60 mL/min (ref >60)
GLUCOSE: 96 mg/dL (ref 65–99)
POTASSIUM: 4 mmol/L (ref 3.5–5.1)
SODIUM: 136 mmol/L (ref 135–145)
TOTAL PROTEIN: 8.9 g/dL — AB (ref 6.5–8.1)
Total Bilirubin: 1.7 mg/dL — ABNORMAL HIGH (ref 0.3–1.2)

## 2015-03-25 LAB — CBC WITH DIFFERENTIAL/PLATELET
BASOS ABS: 0 10*3/uL (ref 0.0–0.1)
Basophils Relative: 0 %
Eosinophils Absolute: 0.1 10*3/uL (ref 0.0–0.7)
Eosinophils Relative: 2 %
HCT: 30 % — ABNORMAL LOW (ref 36.0–46.0)
Hemoglobin: 10.9 g/dL — ABNORMAL LOW (ref 12.0–15.0)
LYMPHS ABS: 1.3 10*3/uL (ref 0.7–4.0)
Lymphocytes Relative: 18 %
MCH: 30.4 pg (ref 26.0–34.0)
MCHC: 36.3 g/dL — ABNORMAL HIGH (ref 30.0–36.0)
MCV: 83.8 fL (ref 78.0–100.0)
MONO ABS: 0.7 10*3/uL (ref 0.1–1.0)
Monocytes Relative: 9 %
NEUTROS PCT: 71 %
Neutro Abs: 5.3 10*3/uL (ref 1.7–7.7)
PLATELETS: 92 10*3/uL — AB (ref 150–400)
RBC: 3.58 MIL/uL — AB (ref 3.87–5.11)
RDW: 13.9 % (ref 11.5–15.5)
WBC: 7.4 10*3/uL (ref 4.0–10.5)

## 2015-03-25 LAB — LACTATE DEHYDROGENASE: LDH: 242 U/L — AB (ref 98–192)

## 2015-03-25 MED ORDER — HYDROMORPHONE HCL 2 MG/ML IJ SOLN
0.5000 mg | Freq: Once | INTRAMUSCULAR | Status: AC
  Administered 2015-03-25: 0.5 mg via INTRAVENOUS
  Filled 2015-03-25: qty 1

## 2015-03-25 MED ORDER — ONDANSETRON HCL 4 MG/2ML IJ SOLN: 4.0000 mg | Freq: Once | INTRAMUSCULAR | Status: DC

## 2015-03-25 MED ORDER — DEXTROSE-NACL 5-0.45 % IV SOLN
INTRAVENOUS | Status: DC
  Administered 2015-03-25: 12:00:00 via INTRAVENOUS

## 2015-03-25 MED ORDER — OXYCODONE HCL 5 MG PO TABS
5.0000 mg | ORAL_TABLET | Freq: Once | ORAL | Status: AC
  Administered 2015-03-25: 5 mg via ORAL
  Filled 2015-03-25: qty 1

## 2015-03-25 MED ORDER — KETOROLAC TROMETHAMINE 30 MG/ML IJ SOLN
30.0000 mg | Freq: Once | INTRAMUSCULAR | Status: AC
  Administered 2015-03-25: 30 mg via INTRAVENOUS
  Filled 2015-03-25: qty 1

## 2015-03-25 MED ORDER — OXYCODONE HCL 5 MG PO TABS: 5.0000 mg | ORAL_TABLET | Freq: Four times a day (QID) | ORAL | Status: DC | PRN

## 2015-03-25 NOTE — Discharge Summary (Signed)
[facility] Discharge Summary   Patient ID: Makayla Fletcher MRN: [id number] DOB/AGE: [birth year] 19 y.o.  Admit date: 03/25/2015 Discharge date: 03/25/2015  Primary Care Physician:  MATTHEWS,MICHELLE A., MD  Admission Diagnoses:  Active Problems:   Sickle cell anemia with pain [facility])    Discharge Medications:    Medication List    ASK your doctor about these medications        acetaminophen 500 MG tablet  Commonly known as:  TYLENOL  Take 1,000 mg by mouth every 6 (six) hours as needed (for pain.).     ergocalciferol 50000 UNITS capsule  Commonly known as:  DRISDOL  Take 1 capsule (50,000 Units total) by mouth once a week.     folic acid 1 MG tablet  Commonly known as:  FOLVITE  TAKE 1 TABLET (1 MG TOTAL) BY MOUTH DAILY.     ibuprofen 200 MG tablet  Commonly known as:  ADVIL,MOTRIN  Take 400 mg by mouth daily as needed (pain).     oxyCODONE 5 MG immediate release tablet  Commonly known as:  ROXICODONE  Take 1 tablet (5 mg total) by mouth every 6 (six) hours as needed for severe pain.         Consults:  None  Significant Diagnostic Studies:  No results found.   [facility] Course:  Patient was admitted to the day infusion center for extended observation. Patient was started on IV fluids for cellular rehydration. She was given Toradol 30 mg IV for inflammation.  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 Pain intensity was reduced from 7/10 to 2/10 to right knee. Patient was instructed to continue home medications as prescribed and hydrate. She is scheduled to follow up in the office in 1 month.  Physical Exam at Discharge:  BP 110/75 mmHg  Pulse 97  Temp(Src) 98.1 F (36.7 C) (Oral)  Resp 18  SpO2 100%  LMP 03/19/2015  General Appearance:    Alert, cooperative, no distress, appears stated age  Head:    Normocephalic, without obvious abnormality,  atraumatic  Eyes:    PERRL, conjunctiva/corneas clear, EOM's intact, fundi    benign, both eyes  Ears:    Normal TM's and external ear canals, both ears  Nose:   Nares normal, septum midline, mucosa normal, no drainage    or sinus tenderness  Throat:   Lips, mucosa, and tongue normal; teeth and gums normal  Neck:   Supple, symmetrical, trachea midline, no adenopathy;    thyroid:  no enlargement/tenderness/nodules; no carotid   bruit or JVD  Back:     Symmetric, no curvature, ROM normal, no CVA tenderness  Lungs:     Clear to auscultation bilaterally, respirations unlabored  Chest Wall:    No tenderness or deformity   Heart:    Regular rate and rhythm, S1 and S2 normal, no murmur, rub or gallop     Abdomen:     Soft, non-tender, bowel sounds active all four quadrants,    no masses, no organomegaly  Extremities:   Extremities normal, atraumatic, no cyanosis or edema  Pulses:   2+ and symmetric all extremities  Skin:   Skin color, texture, turgor normal, no rashes or lesions  Disposition at Discharge: 01-Home or Self Care  Discharge Orders:   Condition at Discharge:   Stable  Time spent on Discharge:  25 minutes  Signed: Chiara Coltrin M 03/25/2015,  4:43 PM

## 2015-03-25 NOTE — H&P (Signed)
[facility] History and Physical   Date: 03/25/2015  Patient name: Makayla Fletcher Medical record number: [id number] Date of birth: 1995-11-15 Age: 19 y.o. Gender: female PCP: MATTHEWS,MICHELLE A., MD  Attending physician: Quentin Angst, MD  Chief Complaint: Right knee pain  History of Present Illness: [name], a 19 year old female with a history of sickle cell anemia, HbSC presents with a 3 day history of pain to right knee consistent with sickle cell anemia. Patient was evaluated in the emergency department on 03/23/2015. She states that pain was decreased after treatment, but later resurfaced.  She describes pain as constant and throbbing. She states that she has been taking Tylenol 500 mg for pain control with minimal relief. She states that pain is aggravated by increased activity. She attributes current pain crisis to changes in weather. Patient denies headache, chest pains, nausea, vomiting, or diarrhea.   Meds: Prescriptions prior to admission  Medication Sig Dispense Refill Last Dose  . acetaminophen (TYLENOL) 500 MG tablet Take 1,000 mg by mouth every 6 (six) hours as needed (for pain.).   Taking  . ergocalciferol (DRISDOL) 50000 UNITS capsule Take 1 capsule (50,000 Units total) by mouth once a week. 4 capsule 12 Taking  . folic acid (FOLVITE) 1 MG tablet TAKE 1 TABLET (1 MG TOTAL) BY MOUTH DAILY. 30 tablet 6 Taking  . ibuprofen (ADVIL,MOTRIN) 200 MG tablet Take 400 mg by mouth daily as needed (pain).    Taking    Allergies: Review of patient's allergies indicates no known allergies. Past Medical History  Diagnosis Date  . Sickle cell anemia (HCC)   . Urinary tract infection July 2013    frequent   . Depression   . Migraine without aura, without mention of intractable migraine without mention of status migrainosus    Past Surgical History  Procedure Laterality Date  . No past surgeries     Family History  Problem Relation Age of  Onset  . Migraines Mother    Social History   Social History  . Marital Status: Single    Spouse Name: N/A  . Number of Children: 0  . Years of Education: college   Occupational History  . MINOR   . 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 Smoking status: Never Smoker   . Smokeless tobacco: Never Used  . Alcohol Use: No  . Drug Use: No  . Sexual Activity:    Partners: Male    Birth Control/ Protection: Pill     Comment: Depoprovera    Other Topics Concern  . Not on file   Social History Narrative    Review of Systems: Review of Systems  Constitutional: Negative.   HENT: Negative.   Eyes: Negative.   Respiratory: Negative.   Cardiovascular: Negative.   Gastrointestinal: Negative.   Endocrine: Negative.   Genitourinary: Negative.   Musculoskeletal: Positive for myalgias (right knee pain).  Skin: Negative.   Allergic/Immunologic: Negative.   Neurological: Negative.   Hematological: Negative.   Psychiatric/Behavioral: Negative.  Negative for suicidal ideas and sleep disturbance.   Physical Exam: Last menstrual period 03/19/2015. LMP 03/19/2015  General Appearance:    Alert, cooperative, mild distress, appears stated age  Head:    Normocephalic, without obvious abnormality, atraumatic  Eyes:    PERRL, conjunctiva/corneas clear, EOM's intact, fundi    benign, both eyes  Ears:    Normal TM's and  external ear canals, both ears  Nose:   Nares normal, septum midline, mucosa normal, no drainage    or sinus tenderness  Throat:   Lips, mucosa, and tongue normal; teeth and gums normal  Neck:   Supple, symmetrical, trachea midline, no adenopathy;    thyroid:  no enlargement/tenderness/nodules; no carotid   bruit or JVD  Back:     Symmetric, no curvature, ROM normal, no CVA tenderness  Lungs:     Clear to auscultation bilaterally, respirations unlabored  Chest Wall:    No tenderness or deformity   Heart:    Regular rate  and rhythm, S1 and S2 normal, no murmur, rub   or gallop  Abdomen:     Soft, non-tender, bowel sounds active all four quadrants,    no masses, no organomegaly  Extremities:   Trace edema to right knee, atraumatic, no cyanosis   Pulses:   2+ and symmetric all extremities  Skin:   Skin color, texture, turgor normal, no rashes or lesions  Lymph nodes:   Cervical, supraclavicular, and axillary nodes normal  Neurologic:   CNII-XII intact, normal strength, sensation and reflexes    throughout    Lab results: No results found for this or any previous visit (from the past 24 hour(s)).  Imaging results:  No results found.   Assessment & Plan:  Patient will be admitted to the day infusion center for extended observation  Start IV D5.45 for cellular rehydration at 125/hr  Start Toradol 30 mg IV every 6 hours for inflammation.  Patient is opiate naive, will give Hydromorphone 0.5 mg times 1, and reassess pain intensity.   Reviewed labs, patient had a drop in hemoglobin and is tachycardic.  Will check CMP, LDH and CBC w/differential Patient is currently out of pain medication, will prescribe Oxycodone 5 mg ever 6 hours as needed. Reviewed [place] Substance Reporting system prior to writing prescription.     Shafiq Larch M 03/25/2015, 12:05 PM

## 2015-03-25 NOTE — Telephone Encounter (Signed)
Patient informed to come in for an internal medication appointment today per [place].

## 2015-03-25 NOTE — Discharge Instructions (Signed)
Sickle Cell Anemia, Adult °Sickle cell anemia is a condition in which red blood cells have an abnormal "sickle" shape. This abnormal shape shortens the cells' life span, which results in a lower than normal concentration of red blood cells in the blood. The sickle shape also causes the cells to clump together and block free blood flow through the blood vessels. As a result, the tissues and organs of the body do not receive enough oxygen. Sickle cell anemia causes organ damage and pain and increases the risk of infection. °CAUSES  °Sickle cell anemia is a genetic disorder. Those who receive two copies of the gene have the condition, and those who receive one copy have the trait. °RISK FACTORS °The sickle cell gene is most common in people whose families originated in [place]. Other areas of the globe where sickle cell trait occurs include the Mediterranean, [place] and [place], the [place], and the [place].  °SIGNS AND SYMPTOMS °· Pain, especially in the extremities, back, chest, or abdomen (common). The pain may start suddenly or may develop following an illness, especially if there is dehydration. Pain can also occur due to overexertion or exposure to extreme temperature changes. °· Frequent severe bacterial infections, especially certain types of pneumonia and meningitis. °· Pain and swelling in the hands and feet. °· Decreased activity.   °· Loss of appetite.   °· Change in behavior. °· Headaches. °· Seizures. °· Shortness of breath or difficulty breathing. °· Vision changes. °· Skin ulcers. °Those with the trait may not have symptoms or they may have mild symptoms.  °DIAGNOSIS  °Sickle cell anemia is diagnosed with blood tests that demonstrate the genetic trait. It is often diagnosed during the newborn period, due to mandatory testing nationwide. A variety of blood tests, X-rays, CT scans, MRI scans, ultrasounds, and lung function tests may also be done to monitor the condition. °TREATMENT  °Sickle  cell anemia may be treated with: °· Medicines. You may be given pain medicines, antibiotic medicines (to treat and prevent infections) or medicines to increase the production of certain types of hemoglobin. °· Fluids. °· Oxygen. °· Blood transfusions. °HOME CARE INSTRUCTIONS  °· Drink enough fluid to keep your urine clear or pale yellow. Increase your fluid intake in hot weather and during exercise. °· Do not smoke. Smoking lowers oxygen levels in the blood.   °· Only take over-the-counter or prescription medicines for pain, fever, or discomfort as directed by your health care provider. °· Take antibiotics as directed by your health care provider. Make sure you finish them it even if you start to feel better.   °· Take supplements as directed by your health care provider.   °· Consider wearing a medical alert bracelet. This tells anyone caring for you in an emergency of your condition.   °· When traveling, keep your medical information, health care provider's names, and the medicines you take with you at all times.   °· If you develop a fever, do not take medicines to reduce the fever right away. This could cover up a problem that is developing. Notify your health care provider. °· Keep all follow-up appointments with your health care provider. Sickle cell anemia requires regular medical care. °SEEK MEDICAL CARE IF: ° You have a fever. °SEEK IMMEDIATE MEDICAL CARE IF:  °· You feel dizzy or faint.   °· You have new abdominal pain, especially on the left side near the stomach area.   °· You develop a persistent, often uncomfortable and painful penile erection (priapism). If this is not treated immediately it   will lead to impotence.   °· You have numbness your arms or legs or you have a hard time moving them.   °· You have a hard time with speech.   °· You have a fever or persistent symptoms for more than 2-3 days.   °· You have a fever and your symptoms suddenly get worse.   °· You have signs or symptoms of infection.  These include:   °¨ Chills.   °¨ Abnormal tiredness (lethargy).   °¨ Irritability.   °¨ Poor eating.   °¨ Vomiting.   °· You develop pain that is not helped with medicine.   °· You develop shortness of breath. °· You have pain in your chest.   °· You are coughing up pus-like or bloody sputum.   °· You develop a stiff neck. °· Your feet or hands swell or have pain. °· Your abdomen appears bloated. °· You develop joint pain. °MAKE SURE YOU: °· Understand these instructions. °Document Released: 09/16/2005 Document Revised: 10/23/2013 Document Reviewed: 01/18/2013 °Exit[employer]® Patient Information ©2015 [employer]. This information is not intended to replace advice given to you by your health care provider. Make sure you discuss any questions you have with your health care provider. ° °

## 2015-03-25 NOTE — Telephone Encounter (Signed)
Patient complains of Right leg pain - 8 of 10  Pain scale. Denies fever, chest pain, nausea/vomting,dirrhea, or abd pain. Informed patient would notify MD and return phone call.

## 2015-03-25 NOTE — Progress Notes (Signed)
Patient alert and oriented. Patient discharge instructions reviewed and questions answered. Patient ambulatory. No complaints.

## 2015-03-25 NOTE — Progress Notes (Signed)
Subjective:    Patient ID: Makayla Fletcher, female    DOB: Mar 25, 1996, 19 y.o.   MRN: [id number]  HPI  Makayla Fletcher, a 19 year old female with a history of sickle cell anemia, HbSC presents with a 3 day history of pain to right knee consistent with sickle cell anemia. Patient was evaluated in the emergency department on 03/23/2015. She states that pain was decreased after treatment, but later resurfaced.  She describes pain as constant and throbbing. She states that she has been taking Tylenol 500 mg for pain control with minimal relief. She states that pain is aggravated by increased activity. She attributes current pain crisis to changes in weather.  Past Medical History  Diagnosis Date  . Sickle cell anemia (HCC)   . Urinary tract infection July 2013    frequent   . Depression   . Migraine without aura, without mention of intractable migraine without mention of status migrainosus    Social History   Social History  . Marital Status: Single    Spouse Name: N/A  . Number of Children: 0  . Years of Education: college   Occupational History  . MINOR   . 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 Smoking status: Never Smoker   . Smokeless tobacco: Never Used  . Alcohol Use: No  . Drug Use: No  . Sexual Activity:    Partners: Male    Birth Control/ Protection: Pill     Comment: Depoprovera    Other Topics Concern  . Not on file   Social History Narrative   Immunization History  Administered Date(s) Administered  . Influenza,inj,Quad PF,36+ Mos 03/13/2014  . Pneumococcal Polysaccharide-23 04/24/2014  No Known Allergies Review of Systems  Constitutional: Negative.   HENT: Negative.   Eyes: Negative.   Respiratory: Negative.   Cardiovascular: Negative.   Gastrointestinal: Negative.   Endocrine: Negative.   Genitourinary: Negative.   Musculoskeletal: Positive for myalgias (right knee pain).  Skin: Negative.    Allergic/Immunologic: Negative.   Neurological: Negative.   Hematological: Negative.   Psychiatric/Behavioral: Negative.  Negative for suicidal ideas and sleep disturbance.      Objective:   Physical Exam  Constitutional: She is oriented to person, place, and time. She appears well-developed and well-nourished.  HENT:  Head: Normocephalic and atraumatic.  Right Ear: External ear normal.  Left Ear: External ear normal.  Mouth/Throat: Oropharynx is clear and moist.  Eyes: Conjunctivae and EOM are normal. Pupils are equal, round, and reactive to light.  Neck: Normal range of motion. Neck supple.  Cardiovascular: Normal rate, regular rhythm, normal heart sounds and intact distal pulses.   Pulmonary/Chest: Effort normal and breath sounds normal.  Abdominal: Soft. Bowel sounds are normal.  Musculoskeletal:       Right shoulder: She exhibits decreased range of motion, tenderness and pain.  Neurological: She is alert and oriented to person, place, and time. She has normal reflexes.  Skin: Skin is warm and dry.  Psychiatric: She has a normal mood and affect. Her behavior is normal. Judgment and thought content normal.      BP 127/87 mmHg  Pulse 120  Temp(Src) 98.2 F (36.8 C) (Oral)  Resp 16  Ht  (1.575 m)  Wt 114 lb (51.71 kg)  BMI 20.85 kg/m2  LMP 03/19/2015 Assessment & Plan:  Sickle cell anemia with pain [facility])   Patient will be transitioned to  the day infusion center for extended observation  Start IV D5.45 for cellular rehydration at 125/hr  Start Toradol 30 mg IV every 6 hours for inflammation.  Patient is opiate naive, will give Hydromorphone 0.5 mg times 1, and reassess pain intensity.   Reviewed labs, patient had a drop in hemoglobin and is tachycardic.  Will check CMP, LDH and CBC w/differential Patient is currently out of pain medication, will prescribe Oxycodone 5 mg ever 6 hours as needed. Reviewed [place] Substance Reporting system prior to writing prescription.      RTC: 1 month for follow-up of sickle cell anemia Massie Maroon, FNP

## 2015-05-26 ENCOUNTER — Encounter (HOSPITAL_COMMUNITY): Payer: Self-pay | Admitting: Emergency Medicine

## 2015-05-26 ENCOUNTER — Emergency Department (HOSPITAL_COMMUNITY): Payer: Medicaid Other

## 2015-05-26 ENCOUNTER — Inpatient Hospital Stay (HOSPITAL_COMMUNITY)
Admission: EM | Admit: 2015-05-26 | Discharge: 2015-05-28 | DRG: 812 | Disposition: A | Discharge status: Home / Self Care | Payer: Self-pay | Attending: Internal Medicine | Admitting: Internal Medicine

## 2015-05-26 DIAGNOSIS — R002 Palpitations: Secondary | ICD-10-CM | POA: Diagnosis present

## 2015-05-26 DIAGNOSIS — F411 Generalized anxiety disorder: Secondary | ICD-10-CM | POA: Diagnosis present

## 2015-05-26 DIAGNOSIS — R000 Tachycardia, unspecified: Secondary | ICD-10-CM | POA: Diagnosis present

## 2015-05-26 DIAGNOSIS — F329 Major depressive disorder, single episode, unspecified: Secondary | ICD-10-CM | POA: Diagnosis present

## 2015-05-26 DIAGNOSIS — D638 Anemia in other chronic diseases classified elsewhere: Secondary | ICD-10-CM | POA: Diagnosis present

## 2015-05-26 DIAGNOSIS — D72829 Elevated white blood cell count, unspecified: Secondary | ICD-10-CM | POA: Diagnosis present

## 2015-05-26 DIAGNOSIS — Z79899 Other long term (current) drug therapy: Secondary | ICD-10-CM

## 2015-05-26 DIAGNOSIS — D5700 Hb-SS disease with crisis, unspecified: Principal | ICD-10-CM | POA: Diagnosis present

## 2015-05-26 LAB — CBC WITH DIFFERENTIAL/PLATELET
Basophils Absolute: 0 10*3/uL (ref 0.0–0.1)
Basophils Relative: 0 %
EOS ABS: 0 10*3/uL (ref 0.0–0.7)
EOS PCT: 0 %
HCT: 29.7 % — ABNORMAL LOW (ref 36.0–46.0)
Hemoglobin: 10.8 g/dL — ABNORMAL LOW (ref 12.0–15.0)
LYMPHS ABS: 1.2 10*3/uL (ref 0.7–4.0)
Lymphocytes Relative: 7 %
MCH: 29.1 pg (ref 26.0–34.0)
MCHC: 36.4 g/dL — AB (ref 30.0–36.0)
MCV: 80.1 fL (ref 78.0–100.0)
MONOS PCT: 5 %
Monocytes Absolute: 0.8 10*3/uL (ref 0.1–1.0)
Neutro Abs: 14.2 10*3/uL — ABNORMAL HIGH (ref 1.7–7.7)
Neutrophils Relative %: 88 %
PLATELETS: 128 10*3/uL — AB (ref 150–400)
RBC: 3.71 MIL/uL — ABNORMAL LOW (ref 3.87–5.11)
RDW: 15.1 % (ref 11.5–15.5)
WBC: 16.3 10*3/uL — ABNORMAL HIGH (ref 4.0–10.5)

## 2015-05-26 LAB — COMPREHENSIVE METABOLIC PANEL
ALT: 20 U/L (ref 14–54)
AST: 34 U/L (ref 15–41)
Albumin: 4.9 g/dL (ref 3.5–5.0)
Alkaline Phosphatase: 61 U/L (ref 38–126)
Anion gap: 13 (ref 5–15)
BUN: 6 mg/dL (ref 6–20)
CHLORIDE: 104 mmol/L (ref 101–111)
CO2: 22 mmol/L (ref 22–32)
CREATININE: 0.64 mg/dL (ref 0.44–1.00)
Calcium: 9.7 mg/dL (ref 8.9–10.3)
GFR calc Af Amer: >60 mL/min (ref >60)
Glucose, Bld: 119 mg/dL — ABNORMAL HIGH (ref 65–99)
Potassium: 3.6 mmol/L (ref 3.5–5.1)
Sodium: 139 mmol/L (ref 135–145)
Total Bilirubin: 2.1 mg/dL — ABNORMAL HIGH (ref 0.3–1.2)
Total Protein: 8.9 g/dL — ABNORMAL HIGH (ref 6.5–8.1)

## 2015-05-26 LAB — PHOSPHORUS: Phosphorus: 3.1 mg/dL (ref 2.5–4.6)

## 2015-05-26 LAB — RETICULOCYTES
RBC.: 3.69 MIL/uL — ABNORMAL LOW (ref 3.87–5.11)
RETIC CT PCT: 3.9 % — AB (ref 0.4–3.1)
Retic Count, Absolute: 143.9 10*3/uL (ref 19.0–186.0)

## 2015-05-26 LAB — MAGNESIUM: Magnesium: 1.8 mg/dL (ref 1.7–2.4)

## 2015-05-26 MED ORDER — HYDROMORPHONE HCL 2 MG/ML IJ SOLN
0.0250 mg/kg | INTRAMUSCULAR | Status: AC
  Filled 2015-05-26: qty 1

## 2015-05-26 MED ORDER — SODIUM CHLORIDE 0.9 % IJ SOLN: 3.0000 mL | Freq: Two times a day (BID) | INTRAMUSCULAR | Status: DC

## 2015-05-26 MED ORDER — PANTOPRAZOLE SODIUM 40 MG PO TBEC
40.0000 mg | DELAYED_RELEASE_TABLET | Freq: Every day | ORAL | Status: DC
  Administered 2015-05-27: 40 mg via ORAL
  Filled 2015-05-26: qty 1

## 2015-05-26 MED ORDER — SODIUM CHLORIDE 0.9 % IJ SOLN: 9.0000 mL | INTRAMUSCULAR | Status: DC | PRN

## 2015-05-26 MED ORDER — DIPHENHYDRAMINE HCL 12.5 MG/5ML PO ELIX: 12.5000 mg | ORAL_SOLUTION | Freq: Four times a day (QID) | ORAL | Status: DC | PRN

## 2015-05-26 MED ORDER — HYDROMORPHONE 1 MG/ML IV SOLN
INTRAVENOUS | Status: DC
  Administered 2015-05-27: 2.4 mg via INTRAVENOUS
  Administered 2015-05-27: 01:00:00 via INTRAVENOUS
  Administered 2015-05-27: 1.8 mg via INTRAVENOUS
  Filled 2015-05-26: qty 25

## 2015-05-26 MED ORDER — ENOXAPARIN SODIUM 40 MG/0.4ML ~~LOC~~ SOLN: 40.0000 mg | SUBCUTANEOUS | Status: DC

## 2015-05-26 MED ORDER — ONDANSETRON HCL 4 MG/2ML IJ SOLN
4.0000 mg | Freq: Once | INTRAMUSCULAR | Status: AC
  Administered 2015-05-26: 4 mg via INTRAVENOUS
  Filled 2015-05-26: qty 2

## 2015-05-26 MED ORDER — HYDROMORPHONE HCL 2 MG/ML IJ SOLN
0.0375 mg/kg | INTRAMUSCULAR | Status: AC
  Administered 2015-05-26: 2 mg via INTRAVENOUS
  Filled 2015-05-26: qty 1

## 2015-05-26 MED ORDER — HYDROMORPHONE HCL 2 MG/ML IJ SOLN
0.0250 mg/kg | INTRAMUSCULAR | Status: AC
  Administered 2015-05-26: 1.3 mg via INTRAVENOUS
  Filled 2015-05-26: qty 1

## 2015-05-26 MED ORDER — SENNOSIDES-DOCUSATE SODIUM 8.6-50 MG PO TABS
1.0000 | ORAL_TABLET | Freq: Two times a day (BID) | ORAL | Status: DC
Start: 2015-05-27 — End: 2015-05-28
  Administered 2015-05-27 – 2015-05-28 (×3): 1 via ORAL
  Filled 2015-05-26 (×3): qty 1

## 2015-05-26 MED ORDER — POLYETHYLENE GLYCOL 3350 17 G PO PACK: 17.0000 g | PACK | Freq: Every day | ORAL | Status: DC | PRN

## 2015-05-26 MED ORDER — DIPHENHYDRAMINE HCL 50 MG/ML IJ SOLN: 12.5000 mg | Freq: Four times a day (QID) | INTRAMUSCULAR | Status: DC | PRN

## 2015-05-26 MED ORDER — HYDROMORPHONE HCL 2 MG/ML IJ SOLN: 0.0375 mg/kg | INTRAMUSCULAR | Status: AC

## 2015-05-26 MED ORDER — WHITE PETROLATUM GEL
Status: DC | PRN
  Administered 2015-05-26: 1 via TOPICAL
  Filled 2015-05-26: qty 5
  Filled 2015-05-26: qty 28.35

## 2015-05-26 MED ORDER — HYDROMORPHONE HCL 2 MG/ML IJ SOLN
0.0313 mg/kg | INTRAMUSCULAR | Status: AC
  Administered 2015-05-26: 1.6 mg via INTRAVENOUS
  Filled 2015-05-26: qty 1

## 2015-05-26 MED ORDER — MAGNESIUM SULFATE 2 GM/50ML IV SOLN
2.0000 g | Freq: Once | INTRAVENOUS | Status: AC
  Administered 2015-05-27: 2 g via INTRAVENOUS
  Filled 2015-05-26: qty 50

## 2015-05-26 MED ORDER — KETOROLAC TROMETHAMINE 30 MG/ML IJ SOLN
30.0000 mg | INTRAMUSCULAR | Status: AC
  Administered 2015-05-26: 30 mg via INTRAVENOUS
  Filled 2015-05-26: qty 1

## 2015-05-26 MED ORDER — HYDROMORPHONE HCL 2 MG/ML IJ SOLN: 0.0313 mg/kg | INTRAMUSCULAR | Status: AC

## 2015-05-26 MED ORDER — SODIUM CHLORIDE 0.45 % IV SOLN
INTRAVENOUS | Status: DC
  Administered 2015-05-26: 19:00:00 via INTRAVENOUS

## 2015-05-26 MED ORDER — NALOXONE HCL 0.4 MG/ML IJ SOLN: 0.4000 mg | INTRAMUSCULAR | Status: DC | PRN

## 2015-05-26 MED ORDER — POTASSIUM CHLORIDE IN NACL 20-0.9 MEQ/L-% IV SOLN
INTRAVENOUS | Status: DC
  Administered 2015-05-27 – 2015-05-28 (×3): via INTRAVENOUS
  Filled 2015-05-26 (×5): qty 1000

## 2015-05-26 NOTE — ED Notes (Signed)
Patient states she has had pain in bilateral legs and lower back since about 10:00 this morning. Hx of sickle cell pain.

## 2015-05-26 NOTE — ED Provider Notes (Signed)
CSN: [id number]     Arrival date & time 05/26/15  1447 History   First MD Initiated Contact with Patient 05/26/15 1718     Chief Complaint  Patient presents with  . Sickle Cell Pain Crisis   Patient is a 19 y.o. female presenting with sickle cell pain.  Sickle Cell Pain Crisis Location:  Chest, back and lower extremity Severity:  Severe Similar to previous crisis episodes: yes   Timing:  Constant Progression:  Worsening Chronicity:  Recurrent Sickle cell genotype:  SS Relieved by:  Nothing Ineffective treatments:  Prescription drugs Associated symptoms: chest pain and shortness of breath   Associated symptoms: no congestion, no cough, no fever, no headaches, no nausea, no swelling of legs, no vision change, no vomiting and no wheezing     Makayla Fletcher is a 19 year old female with past medical history sickle cell anemia presenting with a sickle cell pain crisis. She states that the pain began in her bilateral lower extremities and lumbar back this morning approximately 6 hours before arrival to the emergency department. She states that this is typical of her sickle cell pain. Her last crisis was approximately 2 months ago. She is also complaining of midsternal chest pain with shortness of breath that also began this morning. She states she has never had chest pain with her sickle cell crises before. She has tried her home pain medications of oxycodone with only mild relief. Denies fevers, chills, headaches, blurred vision, dizziness, cough, congestion, palpitations, abdominal pain, nausea or vomiting.   Past Medical History  Diagnosis Date  . Sickle cell anemia (HCC)   . Urinary tract infection July 2013    frequent   . Depression   . Migraine without aura, without mention of intractable migraine without mention of status migrainosus    Past Surgical History  Procedure Laterality Date  . No past surgeries     Family History  Problem Relation Age of Onset  . Migraines Father     Social History  Substance Use Topics  . Smoking status: Never Smoker   . Smokeless tobacco: Never Used  . Alcohol Use: Yes     Comment: Wine   OB History    No data available     Review of Systems  Constitutional: Negative for fever.  HENT: Negative for congestion.   Respiratory: Positive for shortness of breath. Negative for cough and wheezing.   Cardiovascular: Positive for chest pain.  Gastrointestinal: Negative for nausea and vomiting.  Musculoskeletal: Positive for myalgias.  Neurological: Negative for headaches.  All other systems reviewed and are negative.     Allergies  Review of patient's allergies indicates no known allergies.  Home Medications   Prior to Admission medications   Medication Sig Start Date End Date Taking? Authorizing Provider  acetaminophen (TYLENOL) 500 MG tablet Take 1,000 mg by mouth every 6 (six) hours as needed (for pain.).   Yes Historical Provider, MD  ergocalciferol (DRISDOL) 50000 UNITS capsule Take 1 capsule (50,000 Units total) by mouth once a week. 04/24/14  Yes Massie Maroon, FNP  folic acid (FOLVITE) 1 MG tablet TAKE 1 TABLET (1 MG TOTAL) BY MOUTH DAILY. 02/15/15  Yes Henrietta Hoover, NP  ibuprofen (ADVIL,MOTRIN) 200 MG tablet Take 400 mg by mouth daily as needed (pain).    Yes Historical Provider, MD  oxyCODONE (ROXICODONE) 5 MG immediate release tablet Take 1 tablet (5 mg total) by mouth every 6 (six) hours as needed for severe pain. 03/25/15  Yes Erin Sons  Lawanda Cousins, FNP   BP 116/83 mmHg  Pulse 78  Temp(Src) 98 F (36.7 C) (Oral)  Resp 19  Ht  (1.575 m)  Wt 52.362 kg  BMI 21.11 kg/m2  SpO2 100%  LMP 05/12/2015 Physical Exam  Constitutional: She appears well-developed and well-nourished. She appears distressed.  Appears uncomfortable  HENT:  Head: Normocephalic and atraumatic.  Eyes: Conjunctivae are normal. Right eye exhibits no discharge. Left eye exhibits no discharge. No scleral icterus.  Neck: Normal range of  motion.  Cardiovascular: Normal rate, regular rhythm, normal heart sounds and intact distal pulses.   Pedal pulses palpable and cap refill less than 3 seconds  Pulmonary/Chest: Effort normal and breath sounds normal. No respiratory distress. She has no wheezes. She has no rales.  Abdominal: Soft. There is no tenderness. There is no rebound and no guarding.  Musculoskeletal: Normal range of motion. She exhibits tenderness.  Generalized tenderness over the bilateral lower extremities. No focalization. Full range of motion at the hip, knee and ankle intact bilaterally. Generalized tenderness over the lumbar region without localized tenderness over the lumbar spine. Full range of motion of lumbar spine intact. She moves the upper extremities spontaneously and without pain.  Neurological: She is alert. Coordination normal.  5 out of 5 strength in all major muscle groups. Sensation to light touch intact throughout.  Skin: Skin is warm and dry.  Psychiatric: She has a normal mood and affect. Her behavior is normal.  Nursing note and vitals reviewed.   ED Course  Procedures (including critical care time) Labs Review Labs Reviewed  COMPREHENSIVE METABOLIC PANEL - Abnormal; Notable for the following:    Glucose, Bld 119 (*)    Total Protein 8.9 (*)    Total Bilirubin 2.1 (*)    All other components within normal limits  CBC WITH DIFFERENTIAL/PLATELET - Abnormal; Notable for the following:    WBC 16.3 (*)    RBC 3.71 (*)    Hemoglobin 10.8 (*)    HCT 29.7 (*)    MCHC 36.4 (*)    Platelets 128 (*)    Neutro Abs 14.2 (*)    All other components within normal limits  RETICULOCYTES - Abnormal; Notable for the following:    Retic Ct Pct 3.9 (*)    RBC. 3.69 (*)    All other components within normal limits  PHOSPHORUS  MAGNESIUM  URINALYSIS, ROUTINE W REFLEX MICROSCOPIC (NOT AT [facility])  LACTATE DEHYDROGENASE  CBC WITH DIFFERENTIAL/PLATELET  CBC  CREATININE, SERUM  CBC WITH  DIFFERENTIAL/PLATELET  CBC    Imaging Review Dg Chest 2 View  05/26/2015  CLINICAL DATA:  Sickle cell pain crisis. EXAM: CHEST  2 VIEW COMPARISON:  CT of the chest 11/21/2013 FINDINGS: Cardiomediastinal silhouette is upper limits of normal. Mediastinal contours appear intact. There is mild bilateral hilar prominence, likely representing vascular markings. There is no evidence of pleural effusion or pneumothorax. Streaky opacities within the mid right thorax may represent atelectasis or developing airspace consolidation. Osseous structures are without acute abnormality. Soft tissues are grossly normal. IMPRESSION: Upper limits of normal cardiac silhouette. Streaky opacities within the mid right thorax may represent atelectasis or developing airspace consolidation. Electronically Signed   By: Ted Mcalpine M.D.   On: 05/26/2015 18:36   I have personally reviewed and evaluated these images and lab results as part of my medical decision-making.   EKG Interpretation None      MDM   Final diagnoses:  Sickle cell pain crisis (HCC)  19 year old female with PMHx of sickle cell presenting with sickle cell pain. Episode began 6 hours PTA. Also associated with chest pain and SOB. VSS. Oxygenating well. She appears uncomfortable. Generalized tenderness over the lower extremities. Abdomen is soft, non-tender. Heart RRR. Lungs CTAB. Hgb is at her baseline with slight increase in retic to 3.9. White count of 16. CXR positive for possible developing consolidation. Attempted pain control with fluids, toradol and 3 rounds of dilaudid without relief. Consulted hospitalist for admission for pain control and monitoring respiratory status. Pt will receive abx for possible developing pneumonia. Pt seen and evaluated by Dr. Cyndie ChimeNguyen who agrees with the plan. Dr. Robb Matarrtiz will admit the patient.    Makayla HeimlichStevi Jivan Symanski, PA-C 05/27/15 0030  Leta BaptistEmily Roe Nguyen, MD 05/27/15 [phone]

## 2015-05-26 NOTE — ED Notes (Signed)
Pt complains of dizziness when ambulating followed by 2 unmeasured emesis occurrences.

## 2015-05-26 NOTE — ED Notes (Signed)
Patient unable to urinate at this time. 

## 2015-05-26 NOTE — ED Notes (Signed)
Attempted to collect urine sample Admitting MD at bedside

## 2015-05-27 LAB — CBC WITH DIFFERENTIAL/PLATELET
BASOS ABS: 0 10*3/uL (ref 0.0–0.1)
BASOS PCT: 0 %
Eosinophils Absolute: 0 10*3/uL (ref 0.0–0.7)
Eosinophils Relative: 0 %
HEMATOCRIT: 26.5 % — AB (ref 36.0–46.0)
HEMOGLOBIN: 9.5 g/dL — AB (ref 12.0–15.0)
LYMPHS PCT: 6 %
Lymphs Abs: 0.8 10*3/uL (ref 0.7–4.0)
MCH: 29.4 pg (ref 26.0–34.0)
MCHC: 35.8 g/dL (ref 30.0–36.0)
MCV: 82 fL (ref 78.0–100.0)
MONOS PCT: 6 %
Monocytes Absolute: 0.8 10*3/uL (ref 0.1–1.0)
NEUTROS ABS: 10.9 10*3/uL — AB (ref 1.7–7.7)
Neutrophils Relative %: 88 %
PLATELETS: 70 10*3/uL — AB (ref 150–400)
RBC: 3.23 MIL/uL — ABNORMAL LOW (ref 3.87–5.11)
RDW: 14.8 % (ref 11.5–15.5)
WBC: 12.5 10*3/uL — ABNORMAL HIGH (ref 4.0–10.5)

## 2015-05-27 LAB — URINALYSIS, ROUTINE W REFLEX MICROSCOPIC
Bilirubin Urine: NEGATIVE
GLUCOSE, UA: NEGATIVE mg/dL
Hgb urine dipstick: NEGATIVE
Ketones, ur: 15 mg/dL — AB
LEUKOCYTES UA: NEGATIVE
Nitrite: NEGATIVE
PH: 6 (ref 5.0–8.0)
Protein, ur: NEGATIVE mg/dL
SPECIFIC GRAVITY, URINE: 1.009 (ref 1.005–1.030)

## 2015-05-27 LAB — BASIC METABOLIC PANEL
ANION GAP: 8 (ref 5–15)
BUN: 6 mg/dL (ref 6–20)
CHLORIDE: 101 mmol/L (ref 101–111)
CO2: 26 mmol/L (ref 22–32)
Calcium: 8.7 mg/dL — ABNORMAL LOW (ref 8.9–10.3)
Creatinine, Ser: 0.64 mg/dL (ref 0.44–1.00)
GFR calc Af Amer: >60 mL/min (ref >60)
GFR calc non Af Amer: >60 mL/min (ref >60)
GLUCOSE: 122 mg/dL — AB (ref 65–99)
POTASSIUM: 4.1 mmol/L (ref 3.5–5.1)
Sodium: 135 mmol/L (ref 135–145)

## 2015-05-27 LAB — LACTATE DEHYDROGENASE: LDH: 442 U/L — AB (ref 98–192)

## 2015-05-27 MED ORDER — HYDROMORPHONE HCL 1 MG/ML IJ SOLN
1.0000 mg | INTRAMUSCULAR | Status: DC | PRN
  Administered 2015-05-27 (×2): 1 mg via INTRAVENOUS
  Filled 2015-05-27 (×2): qty 1

## 2015-05-27 MED ORDER — ONDANSETRON HCL 4 MG/2ML IJ SOLN
4.0000 mg | Freq: Four times a day (QID) | INTRAMUSCULAR | Status: DC | PRN
  Administered 2015-05-27: 4 mg via INTRAVENOUS
  Filled 2015-05-27: qty 2

## 2015-05-27 MED ORDER — DIPHENHYDRAMINE HCL 25 MG PO CAPS: 25.0000 mg | ORAL_CAPSULE | Freq: Four times a day (QID) | ORAL | Status: DC | PRN

## 2015-05-27 MED ORDER — ONDANSETRON HCL 4 MG/2ML IJ SOLN: 4.0000 mg | Freq: Four times a day (QID) | INTRAMUSCULAR | Status: DC | PRN

## 2015-05-27 MED ORDER — CETYLPYRIDINIUM CHLORIDE 0.05 % MT LIQD
7.0000 mL | Freq: Two times a day (BID) | OROMUCOSAL | Status: DC
  Administered 2015-05-27 – 2015-05-28 (×2): 7 mL via OROMUCOSAL

## 2015-05-27 MED ORDER — NALOXONE HCL 0.4 MG/ML IJ SOLN: 0.4000 mg | INTRAMUSCULAR | Status: DC | PRN

## 2015-05-27 MED ORDER — INFLUENZA VAC SPLIT QUAD 0.5 ML IM SUSY
0.5000 mL | PREFILLED_SYRINGE | INTRAMUSCULAR | Status: AC
  Administered 2015-05-28: 0.5 mL via INTRAMUSCULAR
  Filled 2015-05-27 (×2): qty 0.5

## 2015-05-27 MED ORDER — SODIUM CHLORIDE 0.9 % IJ SOLN: 9.0000 mL | INTRAMUSCULAR | Status: DC | PRN

## 2015-05-27 MED ORDER — KETOROLAC TROMETHAMINE 30 MG/ML IJ SOLN
30.0000 mg | Freq: Four times a day (QID) | INTRAMUSCULAR | Status: DC
  Administered 2015-05-27 – 2015-05-28 (×5): 30 mg via INTRAVENOUS
  Filled 2015-05-27 (×5): qty 1

## 2015-05-27 MED ORDER — HYDROMORPHONE HCL 1 MG/ML IJ SOLN: 1.0000 mg | INTRAMUSCULAR | Status: DC | PRN

## 2015-05-27 MED ORDER — ENOXAPARIN SODIUM 40 MG/0.4ML ~~LOC~~ SOLN
40.0000 mg | SUBCUTANEOUS | Status: DC
  Administered 2015-05-27: 40 mg via SUBCUTANEOUS
  Filled 2015-05-27: qty 0.4

## 2015-05-27 MED ORDER — HYDROMORPHONE 1 MG/ML IV SOLN
INTRAVENOUS | Status: DC
  Administered 2015-05-27: 3.5 mg via INTRAVENOUS
  Administered 2015-05-27: 0.9 mg via INTRAVENOUS
  Administered 2015-05-27 – 2015-05-28 (×2): 0.5 mg via INTRAVENOUS
  Administered 2015-05-28: 2 mg via INTRAVENOUS
  Administered 2015-05-28 (×2): 1 mg via INTRAVENOUS
  Administered 2015-05-28: 2 mg via INTRAVENOUS

## 2015-05-27 NOTE — H&P (Signed)
[facility] History and Physical  Makayla Fletcher:[id number] DOB: December 09, 1995 DOA: 05/26/2015  Referring physician: Alveta Heimlich, PA-C PCP: MATTHEWS,MICHELLE A., MD   Chief Complaint: Legs and lower back pain.   HPI: Makayla Fletcher is a 19 y.o. female with a past medical history of sickle cell anemia who comes to the emergency department with a history of fatigue, mild dyspnea, bilateral lower extremity and lower back pain since this morning around 10 AM. She denies fevers, chills, but complains of fatigue. She feels mildly dyspneic on exertion, but denies sore throat, nasal congestion, cough, diarrhea or urinary symptoms. She denies chest pain today, but stated that she had chest pain and palpitations 2 days ago after having a disagreement with her roommate. She is states that her symptoms are consistent with her sickle cell pain crisis previous episodes.  While in the ER, the patient developed dizziness followed by 2 episodes of emesis. She is states that her pain and nausea are much better now after she was medicated. She is in no acute distress.   Review of Systems:  Constitutional:  Positive fatigue.  No weight loss, night sweats, Fevers, chills, HEENT:  No headaches, Difficulty swallowing,Tooth/dental problems,Sore throat,  No sneezing, itching, ear ache, nasal congestion, post nasal drip,  Cardio-vascular:  Positive dizziness followed by emesis while in the emergency department this evening.   Positive chest pain and palpitations on Friday after having a disagreement with her roommate,  No chest pain since then, but still complains of occasional palpitations. denies orthopnea, PND, swelling in lower extremities, anasarca,  GI:  Positive nausea, vomiting 2 this evening in the emergency department. No heartburn, indigestion, abdominal pain,  diarrhea, change in bowel habits, loss of appetite  Resp:  Mild dyspnea, but no cough, hemoptysis or wheezing. Skin:  no  pruritus, rash or lesions.  GU:  no dysuria, change in color of urine, no urgency or frequency. No flank pain.  Musculoskeletal:  Positive lower extremities and lower back pain. Psych:  Occasional anxiety. History of depression. Denies symptoms at this time.  No change in mood or affect. No memory loss.   Past Medical History  Diagnosis Date  . Sickle cell anemia (HCC)   . Urinary tract infection July 2013    frequent   . Depression   . Migraine without aura, without mention of intractable migraine without mention of status migrainosus    Past Surgical History  Procedure Laterality Date  . No past surgeries     Social History:  reports that she has never smoked. She has never used smokeless tobacco. She reports that she drinks alcohol. She reports that she does not use illicit drugs.  No Known Allergies  Family History  Problem Relation Age of Onset  . Migraines Father     Prior to Admission medications   Medication Sig Start Date End Date Taking? Authorizing Provider  acetaminophen (TYLENOL) 500 MG tablet Take 1,000 mg by mouth every 6 (six) hours as needed (for pain.).   Yes Historical Provider, MD  ergocalciferol (DRISDOL) 50000 UNITS capsule Take 1 capsule (50,000 Units total) by mouth once a week. 04/24/14  Yes Massie Maroon, FNP  folic acid (FOLVITE) 1 MG tablet TAKE 1 TABLET (1 MG TOTAL) BY MOUTH DAILY. 02/15/15  Yes Henrietta Hoover, NP  ibuprofen (ADVIL,MOTRIN) 200 MG tablet Take 400 mg by mouth daily as needed (pain).    Yes Historical Provider, MD  oxyCODONE (ROXICODONE) 5 MG immediate release tablet Take 1  tablet (5 mg total) by mouth every 6 (six) hours as needed for severe pain. 03/25/15  Yes Massie MaroonLachina M Hollis, FNP   Physical Exam: Filed Vitals:   05/26/15 2300 05/26/15 2315 05/26/15 2330 05/27/15 0000  BP: 115/78  120/85 116/83  Pulse: 95 84  78  Temp:      TempSrc:      Resp: 23 22  19   Height:      Weight:      SpO2: 100% 100%  100%    Wt Readings  from Last 3 Encounters:  05/26/15 52.362 kg (115 lb 7 oz)  03/25/15 51.71 kg (114 lb)  03/23/15 54.432 kg (120 lb)    General:  Appears calm and comfortable Eyes: PERRL, normal lids, irises & conjunctiva ENT: grossly normal hearing, lips & tongue Neck: no LAD, masses or thyromegaly Cardiovascular: RRR, no m/r/g. No LE edema. Telemetry: SR, no arrhythmias  Respiratory: CTA bilaterally, no w/r/r. Normal respiratory effort. Abdomen: soft, ntnd Skin: no rash or induration seen on limited exam Musculoskeletal: grossly normal tone BUE/BLE Psychiatric: grossly normal mood and affect, speech fluent and appropriate Neurologic: grossly non-focal.          Labs on Admission:  Basic Metabolic Panel:  Recent Labs Lab 05/26/15 1540  NA 139  K 3.6  CL 104  CO2 22  GLUCOSE 119*  BUN 6  CREATININE 0.64  CALCIUM 9.7  MG 1.8  PHOS 3.1   Liver Function Tests:  Recent Labs Lab 05/26/15 1540  AST 34  ALT 20  ALKPHOS 61  BILITOT 2.1*  PROT 8.9*  ALBUMIN 4.9   CBC:  Recent Labs Lab 05/26/15 1540  WBC 16.3*  NEUTROABS 14.2*  HGB 10.8*  HCT 29.7*  MCV 80.1  PLT 128*    Radiological Exams on Admission: Dg Chest 2 View  05/26/2015  CLINICAL DATA:  Sickle cell pain crisis. EXAM: CHEST  2 VIEW COMPARISON:  CT of the chest 11/21/2013 FINDINGS: Cardiomediastinal silhouette is upper limits of normal. Mediastinal contours appear intact. There is mild bilateral hilar prominence, likely representing vascular markings. There is no evidence of pleural effusion or pneumothorax. Streaky opacities within the mid right thorax may represent atelectasis or developing airspace consolidation. Osseous structures are without acute abnormality. Soft tissues are grossly normal. IMPRESSION: Upper limits of normal cardiac silhouette. Streaky opacities within the mid right thorax may represent atelectasis or developing airspace consolidation. Electronically Signed   By: Ted Mcalpineobrinka  Dimitrova M.D.   On:  05/26/2015 18:36   Echocardiogram: 03/13/2014.  ------------------------------------------------------------------- LV EF: 50% -  55%  ------------------------------------------------------------------- Indications:   Murmur 785.2.  ------------------------------------------------------------------- History:  Risk factors: Sickle Cell.  ------------------------------------------------------------------- Study Conclusions  - Left ventricle: Global LV strain is -18.7% The cavity size was normal. Systolic function was normal. The estimated ejection fraction was in the range of 50% to 55%. Wall motion was normal; there were no regional wall motion abnormalities.  EKG: Independently reviewed. EKG in ordered status.  Assessment/Plan Principal Problem:   Sickle cell pain crisis (HCC) Admit to telemetry. Continue IV fluids. Analgesics and antiemetics as needed. Monitor hematocrit and hemoglobin.  Active Problems:   Generalized anxiety disorder Continue pain management. Patient to follow-up with primary care provider for better control of this.    Sinus tachycardia (HCC)   Heart palpitations Likely due to a stress and anemia. Continue cardiac monitoring. Check EKG. Optimize potassium and magnesium.    Leukocytosis. The patient does not have a fever, does not have cough and is  satting in the high 90s on room air. He checks his rate is abnormal, but several previous chest radiographs have been abnormal dating all the way back to December 23rd, 2013. She does not have diarrhea or urinary symptoms. Lungs are clear on auscultation.  Follow-up WBCs. Repeat chest x-ray in the morning after IV hydration and incentive spirometry. Start IV antibiotics if the patient develops a fever.    Code Status: Full code. DVT Prophylaxis: Lovenox SQ. Family Communication:  Disposition Plan: Admit for IV hydration, pain management, H&H monitoring.  Time spent: Over 75 minutes  were spent during the process of this admission.  Bobette Mo [facility] Pager [phone].

## 2015-05-27 NOTE — Progress Notes (Signed)
Nutrition Brief Note  Patient identified on the Malnutrition Screening Tool (MST) Report  Wt Readings from Last 15 Encounters:  05/27/15 121 lb 0.5 oz (54.9 kg)  03/25/15 114 lb (51.71 kg)  03/23/15 120 lb (54.432 kg)  08/08/14 119 lb (53.978 kg)  04/24/14 116 lb (52.617 kg)  03/15/14 118 lb (53.524 kg)  03/13/14 115 lb (52.164 kg)  12/18/13 114 lb (51.71 kg) (22 %*, Z = -0.77)  11/29/13 112 lb (50.803 kg) (19 %*, Z = -0.89)  11/21/13 114 lb (51.71 kg) (22 %*, Z = -0.76)  08/24/13 117 lb (53.071 kg) (29 %*, Z = -0.56)  08/06/13 115 lb (52.164 kg) (25 %*, Z = -0.67)  06/24/13 114 lb (51.71 kg) (23 %*, Z = -0.72)  06/23/13 114 lb 1.6 oz (51.755 kg) (24 %*, Z = -0.72)  04/03/13 115 lb (52.164 kg) (26 %*, Z = -0.63)   * Growth percentiles are based on CDC 2-20 Years data.    Body mass index is 22.13 kg/(m^2). Patient meets criteria for normal weight based on current BMI.   Pt admitted for sickle cell crisis. She was having some fatigue with dizziness and associated emesis. Since medication administration dizziness has subsided and no further episodes of emesis since pt was in the ED.   Per review, her weight has been stable since February 2016 (114-121 lbs). Current weight consistent with weight from 03/23/15.  Labs and medications reviewed.   No nutrition interventions warranted at this time. If nutrition issues arise, please consult RD.      Trenton GammonJessica Keion Neels, RD, LDN Inpatient Clinical Dietitian Pager # [phone] After hours/weekend pager # [phone]

## 2015-05-27 NOTE — Progress Notes (Signed)
SICKLE CELL SERVICE PROGRESS NOTE  Makayla Fletcher:[id number] DOB: 05-11-1996 DOA: 05/26/2015 PCP: Shaleen Talamantez A., MD  Assessment/Plan: Principal Problem:   Sickle cell pain crisis (HCC) Active Problems:   Generalized anxiety disorder   Sinus tachycardia (HCC)   Heart palpitations  1. Hb [place] with crisis: Opiate naive pt with Hb [place] well known to me from previous care. She reports that her pain is down to 6/10 and localized to her knees and legs. She has used only 4.5 mg with 15/15: demands/deliveries in the last 10 hours. Pt is encoraged to use PCA for control of pain. Her goal is a pain level of 4/10 and she is currently at 6/10. Will continue Toradol and decrease IVF to 50 ml/hr.  2. Leukocytosis: No evidence of infection. Will check urine to eliminate chance of cystitis. 3. Anemia of chronic disease: Hb at baseline.  Code Status: Full Code Family Communication: N/A Disposition Plan: Not yet ready for discharge  Gianno Volner A.  Pager [phone]. If 7PM-7AM, please contact night-coverage.  05/27/2015, 12:09 PM  LOS: 1 day   Interim History: Pt reports marked improvement in the way she feels generally. Her pain has also decreased to 6/10 down from 9/10 and is localized to knees and legs.  Consultants:  None  Procedures:  None  Antibiotics:  None   Objective: Filed Vitals:   05/27/15 0120 05/27/15 0411 05/27/15 0418 05/27/15 0800  BP:  122/80    Pulse:  98    Temp:  98.9 F (37.2 C)    TempSrc:  Oral    Resp: Height:      Weight:      SpO2: 100% 100% 100% 100%   Weight change:   Intake/Output Summary (Last 24 hours) at 05/27/15 1209 Last data filed at 05/27/15 0600  Gross per 24 hour  Intake 635.42 ml  Output      0 ml  Net 635.42 ml    General: Alert, awake, oriented x3, in no acute distress.  HEENT: [place]/AT PEERL, EOMI, anicteric Neck: Trachea midline,  no masses, no thyromegal,y no JVD, no carotid bruit OROPHARYNX:  Moist, No  exudate/ erythema/lesions.  Heart: Regular rate and rhythm, without murmurs, rubs, gallops, PMI non-displaced, no heaves or thrills on palpation.  Lungs: Clear to auscultation, no wheezing or rhonchi noted. No increased vocal fremitus resonant to percussion  Abdomen: Soft, nontender, nondistended, positive bowel sounds, no masses no hepatosplenomegaly noted.  Neuro: No focal neurological deficits noted cranial nerves II through XII grossly intact.  Strength at functional baseline in bilateral upper and lower extremities. Musculoskeletal: No warm swelling or erythema around joints, no spinal tenderness noted.    Data Reviewed: Basic Metabolic Panel:  Recent Labs Lab 05/26/15 1540 05/27/15 1025  NA 139 135  K 3.6 4.1  CL 104 101  CO2 22 26  GLUCOSE 119* 122*  BUN 6 6  CREATININE 0.64 0.64  CALCIUM 9.7 8.7*  MG 1.8  --   PHOS 3.1  --    Liver Function Tests:  Recent Labs Lab 05/26/15 1540  AST 34  ALT 20  ALKPHOS 61  BILITOT 2.1*  PROT 8.9*  ALBUMIN 4.9   No results for input(s): LIPASE, AMYLASE in the last 168 hours. No results for input(s): AMMONIA in the last 168 hours. CBC:  Recent Labs Lab 05/26/15 1540 05/27/15 1025  WBC 16.3* 12.5*  NEUTROABS 14.2* 10.9*  HGB 10.8* 9.5*  HCT 29.7* 26.5*  MCV 80.1 82.0  PLT 128* 70*   Cardiac Enzymes: No results for input(s): CKTOTAL, CKMB, CKMBINDEX, TROPONINI in the last 168 hours. BNP (last 3 results) No results for input(s): BNP in the last 8760 hours.  ProBNP (last 3 results) No results for input(s): PROBNP in the last 8760 hours.  CBG: No results for input(s): GLUCAP in the last 168 hours.  No results found for this or any previous visit (from the past 240 hour(s)).   Studies: Dg Chest 2 View  05/26/2015  CLINICAL DATA:  Sickle cell pain crisis. EXAM: CHEST  2 VIEW COMPARISON:  CT of the chest 11/21/2013 FINDINGS: Cardiomediastinal silhouette is upper limits of normal. Mediastinal contours appear intact.  There is mild bilateral hilar prominence, likely representing vascular markings. There is no evidence of pleural effusion or pneumothorax. Streaky opacities within the mid right thorax may represent atelectasis or developing airspace consolidation. Osseous structures are without acute abnormality. Soft tissues are grossly normal. IMPRESSION: Upper limits of normal cardiac silhouette. Streaky opacities within the mid right thorax may represent atelectasis or developing airspace consolidation. Electronically Signed   By: Ted Mcalpineobrinka  Dimitrova M.D.   On: 05/26/2015 18:36    Scheduled Meds: . antiseptic oral rinse  7 mL Mouth Rinse BID  . HYDROmorphone   Intravenous 6 times per day  . [START ON 05/28/2015] Influenza vac split quadrivalent PF  0.5 mL Intramuscular Tomorrow-1000  . ketorolac  30 mg Intravenous 4 times per day  . senna-docusate  1 tablet Oral BID  . sodium chloride  3 mL Intravenous Q12H   Continuous Infusions: . 0.9 % NaCl with KCl 20 mEq / L 125 mL/hr at 05/27/15 0920    Time spent 25 minutes

## 2015-05-27 NOTE — Progress Notes (Signed)
Report received from Unknown JimJ. Rimando, RN. No change from initial assessment. Will continue to monitor and follow the POC.

## 2015-05-28 DIAGNOSIS — D638 Anemia in other chronic diseases classified elsewhere: Secondary | ICD-10-CM

## 2015-05-28 DIAGNOSIS — R000 Tachycardia, unspecified: Secondary | ICD-10-CM

## 2015-05-28 DIAGNOSIS — D5700 Hb-SS disease with crisis, unspecified: Principal | ICD-10-CM

## 2015-05-28 NOTE — Discharge Summary (Signed)
Makayla Fletcher MRN: [id number] DOB/AGE: 01-08-1996 19 y.o.  Admit date: 05/26/2015 Discharge date: 05/28/2015  Primary Care Physician:  Makayla A., MD   Discharge Diagnoses:   Patient Active Problem List   Diagnosis Date Noted  . Sickle cell pain crisis (HCC) 05/26/2015  . Sickle cell anemia with pain (HCC) 03/25/2015  . Hypoglycemia 03/20/2014  . Irregular heartbeat 03/13/2014  . Heart palpitations 03/13/2014  . Migraine without aura, without mention of intractable migraine without mention of status migrainosus 12/18/2013  . Dizziness 08/24/2013  . 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 Sinus tachycardia (HCC) 06/18/2012  . Hemoglobin [place] with crisis (HCC) 06/18/2012  . Menorrhagia with irregular cycle 06/18/2012  . Sickle cell anemia (HCC) 06/17/2012  . Hyponatremia 06/17/2012  . Depression, major, single episode, moderate (HCC) 07/31/2011  . Generalized anxiety disorder 07/31/2011    DISCHARGE MEDICATION:   Medication List    TAKE these medications        acetaminophen 500 MG tablet  Commonly known as:  TYLENOL  Take 1,000 mg by mouth every 6 (six) hours as needed (for pain.).     ergocalciferol 50000 UNITS capsule  Commonly known as:  DRISDOL  Take 1 capsule (50,000 Units total) by mouth once a week.     folic acid 1 MG tablet  Commonly known as:  FOLVITE  TAKE 1 TABLET (1 MG TOTAL) BY MOUTH DAILY.     ibuprofen 200 MG tablet  Commonly known as:  ADVIL,MOTRIN  Take 400 mg by mouth daily as needed (pain).     oxyCODONE 5 MG immediate release tablet  Commonly known as:  ROXICODONE  Take 1 tablet (5 mg total) by mouth every 6 (six) hours as needed for severe pain.          Consults:     SIGNIFICANT DIAGNOSTIC STUDIES:  Dg Chest 2 View  05/26/2015  CLINICAL DATA:  Sickle cell pain crisis. EXAM: CHEST  2 VIEW COMPARISON:  CT of the chest 11/21/2013 FINDINGS: Cardiomediastinal silhouette is upper limits of normal.  Mediastinal contours appear intact. There is mild bilateral hilar prominence, likely representing vascular markings. There is no evidence of pleural effusion or pneumothorax. Streaky opacities within the mid right thorax may represent atelectasis or developing airspace consolidation. Osseous structures are without acute abnormality. Soft tissues are grossly normal. IMPRESSION: Upper limits of normal cardiac silhouette. Streaky opacities within the mid right thorax may represent atelectasis or developing airspace consolidation. Electronically Signed   By: Ted Mcalpine M.D.   On: 05/26/2015 18:36      No results found for this or any previous visit (from the past 240 hour(s)).  BRIEF ADMITTING H & P: Makayla Fletcher is a 20 y.o. female with a past medical history of sickle cell anemia who comes to the emergency department with a history of fatigue, mild dyspnea, bilateral lower extremity and lower back pain since this morning around 10 AM. She denies fevers, chills, but complains of fatigue. She feels mildly dyspneic on exertion, but denies sore throat, nasal congestion, cough, diarrhea or urinary symptoms. She denies chest pain today, but stated that she had chest pain and palpitations 2 days ago after having a disagreement with her roommate. She is states that her symptoms are consistent with her sickle cell pain crisis previous episodes.  While in the ER, the patient developed dizziness followed by 2 episodes of emesis. She is states that her pain and nausea are much better now after she  was medicated. She is in no acute distress.    Hospital Course:  Present on Admission:  . Sickle cell pain crisis (HCC): Pt with HB [place] was admitted in crisis. Her pain was managed with Dilaudid via PCA, Toradol and IVF. As the pain improved she was transitioned to oral analgesics and then PCa discontinued. She had a reduction in her pain to a level of 3/10 at the time of discharge. She is discharged home in  good condition and was ambulatory with no significant pain at the time of discharge.   . Sinus tachycardia (HCC)/Heart palpitations: This was transiently present during the time of intense pain, This resolved as pain was controlled. At the time of discharge her HR was controlled and she had no further palpitations.   . Anemia of Chronic Disease: Pt's Hb at baseline.   Disposition and Follow-up: Pt discharged in good condition and is to follow up with PMD as needed.     Discharge Instructions    Diet general    Complete by:  As directed      Increase activity slowly    Complete by:  As directed            DISCHARGE EXAM:  General: Alert, awake, oriented x3, in mild distress.  Vital Signs: BP 105/65, HR 99, T 98.2 F (36.8 C), temperature source Oral, RR 16, height 5\' 2"  (1.575 m), weight 121 lb 0.5 oz (54.9 kg), last menstrual period 05/12/2015, SpO2 100 %. HEENT: [place]/AT PEERL, EOMI, anicteric Neck: Trachea midline, no masses, no thyromegal,y no JVD, no carotid bruit OROPHARYNX: Moist, No exudate/ erythema/lesions.  Heart: Regular rate and rhythm, without murmurs, rubs, gallops or S3. PMI non-displaced. Exam reveals no decreased pulses. Pulmonary/Chest: Normal effort. Breath sounds normal. No. Apnea. Clear to auscultation,no stridor,  no wheezing and no rhonchi noted. No respiratory distress and no tenderness noted. Abdomen: Soft, nontender, nondistended, normal bowel sounds, no masses no hepatosplenomegaly noted. No fluid wave and no ascites. There is no guarding or rebound. Neuro: Alert and oriented to person, place and time. Normal motor skills, Displays no atrophy or tremors and exhibits normal muscle tone.  No focal neurological deficits noted cranial nerves II through XII grossly intact. No sensory deficit noted. Strength at baseline in bilateral upper and lower extremities. Gait normal. Musculoskeletal: No warm swelling or erythema around joints, no spinal tenderness  noted. Psychiatric: Patient alert and oriented x3, good insight and cognition, good recent to remote recall. Mood, memory, affect and judgement normal Lymph node survey: No cervical axillary or inguinal lymphadenopathy noted. Skin: Skin is warm and dry. No bruising, no ecchymosis and no rash noted. Pt is not diaphoretic. No erythema. No pallor      Recent Labs  05/26/15 1540 05/27/15 1025  NA 139 135  K 3.6 4.1  CL 104 101  CO2 22 26  GLUCOSE 119* 122*  BUN 6 6  CREATININE 0.64 0.64  CALCIUM 9.7 8.7*  MG 1.8  --   PHOS 3.1  --     Recent Labs  05/26/15 1540  AST 34  ALT 20  ALKPHOS 61  BILITOT 2.1*  PROT 8.9*  ALBUMIN 4.9   No results for input(s): LIPASE, AMYLASE in the last 72 hours.  Recent Labs  05/26/15 1540 05/27/15 1025  WBC 16.3* 12.5*  NEUTROABS 14.2* 10.9*  HGB 10.8* 9.5*  HCT 29.7* 26.5*  MCV 80.1 82.0  PLT 128* 70*     Total time spent including face to face  and decision making was greater than 30 minutes  Signed: MATTHEWS,MICHELLE A. 05/28/2015, 2:02 PM

## 2015-05-30 ENCOUNTER — Telehealth: Payer: Self-pay | Admitting: Internal Medicine

## 2015-05-30 DIAGNOSIS — D5700 Hb-SS disease with crisis, unspecified: Secondary | ICD-10-CM

## 2015-05-30 NOTE — Telephone Encounter (Signed)
Refill request for Oxycodone 5mg . LOV 03/25/2015. Please advise. Thanks!

## 2015-05-31 ENCOUNTER — Telehealth (HOSPITAL_COMMUNITY): Payer: Self-pay | Admitting: *Deleted

## 2015-05-31 ENCOUNTER — Non-Acute Institutional Stay (HOSPITAL_COMMUNITY)
Admission: AD | Admit: 2015-05-31 | Discharge: 2015-05-31 | Disposition: A | Discharge status: Home / Self Care | Payer: Self-pay | Attending: Internal Medicine | Admitting: Internal Medicine

## 2015-05-31 ENCOUNTER — Encounter (HOSPITAL_COMMUNITY): Payer: Self-pay | Admitting: *Deleted

## 2015-05-31 DIAGNOSIS — Z791 Long term (current) use of non-steroidal anti-inflammatories (NSAID): Secondary | ICD-10-CM | POA: Insufficient documentation

## 2015-05-31 DIAGNOSIS — D5700 Hb-SS disease with crisis, unspecified: Secondary | ICD-10-CM

## 2015-05-31 DIAGNOSIS — Z79891 Long term (current) use of opiate analgesic: Secondary | ICD-10-CM | POA: Insufficient documentation

## 2015-05-31 DIAGNOSIS — Z79899 Other long term (current) drug therapy: Secondary | ICD-10-CM | POA: Insufficient documentation

## 2015-05-31 LAB — CBC WITH DIFFERENTIAL/PLATELET
Basophils Absolute: 0 10*3/uL (ref 0.0–0.1)
Basophils Relative: 0 %
EOS PCT: 2 %
Eosinophils Absolute: 0.3 10*3/uL (ref 0.0–0.7)
HCT: 20.4 % — ABNORMAL LOW (ref 36.0–46.0)
HEMOGLOBIN: 7.3 g/dL — AB (ref 12.0–15.0)
LYMPHS PCT: 17 %
Lymphs Abs: 2.4 10*3/uL (ref 0.7–4.0)
MCH: 29.6 pg (ref 26.0–34.0)
MCHC: 35.8 g/dL (ref 30.0–36.0)
MCV: 82.6 fL (ref 78.0–100.0)
MONOS PCT: 10 %
Monocytes Absolute: 1.4 10*3/uL — ABNORMAL HIGH (ref 0.1–1.0)
NEUTROS ABS: 10.3 10*3/uL — AB (ref 1.7–7.7)
NRBC: 14 /100{WBCs} — AB
Neutrophils Relative %: 71 %
PLATELETS: 92 10*3/uL — AB (ref 150–400)
RBC: 2.47 MIL/uL — ABNORMAL LOW (ref 3.87–5.11)
RDW: 15.4 % (ref 11.5–15.5)
WBC: 14.4 10*3/uL — ABNORMAL HIGH (ref 4.0–10.5)

## 2015-05-31 LAB — RETICULOCYTES
RBC.: 2.47 MIL/uL — ABNORMAL LOW (ref 3.87–5.11)
RETIC CT PCT: 9.9 % — AB (ref 0.4–3.1)
Retic Count, Absolute: 244.5 10*3/uL — ABNORMAL HIGH (ref 19.0–186.0)

## 2015-05-31 LAB — COMPREHENSIVE METABOLIC PANEL
ALBUMIN: 3.9 g/dL (ref 3.5–5.0)
ALT: 19 U/L (ref 14–54)
AST: 40 U/L (ref 15–41)
Alkaline Phosphatase: 76 U/L (ref 38–126)
Anion gap: 9 (ref 5–15)
BILIRUBIN TOTAL: 2.9 mg/dL — AB (ref 0.3–1.2)
BUN: 9 mg/dL (ref 6–20)
CHLORIDE: 97 mmol/L — AB (ref 101–111)
CO2: 29 mmol/L (ref 22–32)
CREATININE: 0.58 mg/dL (ref 0.44–1.00)
Calcium: 9.4 mg/dL (ref 8.9–10.3)
GFR calc Af Amer: >60 mL/min (ref >60)
GLUCOSE: 123 mg/dL — AB (ref 65–99)
POTASSIUM: 3.8 mmol/L (ref 3.5–5.1)
Sodium: 135 mmol/L (ref 135–145)
Total Protein: 8 g/dL (ref 6.5–8.1)

## 2015-05-31 LAB — LACTATE DEHYDROGENASE: LDH: 1069 U/L — AB (ref 98–192)

## 2015-05-31 MED ORDER — NALOXONE HCL 0.4 MG/ML IJ SOLN: 0.4000 mg | INTRAMUSCULAR | Status: DC | PRN

## 2015-05-31 MED ORDER — ONDANSETRON HCL 4 MG/2ML IJ SOLN
4.0000 mg | Freq: Four times a day (QID) | INTRAMUSCULAR | Status: DC | PRN
Start: 2015-05-31 — End: 2015-05-31
  Administered 2015-05-31: 4 mg via INTRAVENOUS
  Filled 2015-05-31: qty 2

## 2015-05-31 MED ORDER — KETOROLAC TROMETHAMINE 30 MG/ML IJ SOLN
15.0000 mg | Freq: Once | INTRAMUSCULAR | Status: AC
  Administered 2015-05-31: 15 mg via INTRAVENOUS
  Filled 2015-05-31: qty 1

## 2015-05-31 MED ORDER — DEXTROSE-NACL 5-0.45 % IV SOLN
INTRAVENOUS | Status: DC
  Administered 2015-05-31: 11:00:00 via INTRAVENOUS

## 2015-05-31 MED ORDER — HYDROMORPHONE 1 MG/ML IV SOLN
INTRAVENOUS | Status: DC
  Administered 2015-05-31: 1.7 mg via INTRAVENOUS
  Administered 2015-05-31: 11:00:00 via INTRAVENOUS
  Filled 2015-05-31: qty 25

## 2015-05-31 MED ORDER — SODIUM CHLORIDE 0.9 % IJ SOLN: 9.0000 mL | INTRAMUSCULAR | Status: DC | PRN

## 2015-05-31 MED ORDER — DIPHENHYDRAMINE HCL 12.5 MG/5ML PO ELIX: 12.5000 mg | ORAL_SOLUTION | Freq: Four times a day (QID) | ORAL | Status: DC | PRN

## 2015-05-31 MED ORDER — OXYCODONE HCL 5 MG PO TABS: 5.0000 mg | ORAL_TABLET | Freq: Four times a day (QID) | ORAL | Status: DC | PRN

## 2015-05-31 MED ORDER — DIPHENHYDRAMINE HCL 50 MG/ML IJ SOLN: 12.5000 mg | Freq: Four times a day (QID) | INTRAMUSCULAR | Status: DC | PRN

## 2015-05-31 NOTE — Discharge Instructions (Signed)
° ° ° °Sickle Cell Anemia, Adult °Sickle cell anemia is a condition in which red blood cells have an abnormal "sickle" shape. This abnormal shape shortens the cells' life span, which results in a lower than normal concentration of red blood cells in the blood. The sickle shape also causes the cells to clump together and block free blood flow through the blood vessels. As a result, the tissues and organs of the body do not receive enough oxygen. Sickle cell anemia causes organ damage and pain and increases the risk of infection. °CAUSES  °Sickle cell anemia is a genetic disorder. Those who receive two copies of the gene have the condition, and those who receive one copy have the trait. °RISK FACTORS °The sickle cell gene is most common in people whose families originated in [place]. Other areas of the globe where sickle cell trait occurs include the Mediterranean, [place] and [place], the [place], and the [place].  °SIGNS AND SYMPTOMS °· Pain, especially in the extremities, back, chest, or abdomen (common). The pain may start suddenly or may develop following an illness, especially if there is dehydration. Pain can also occur due to overexertion or exposure to extreme temperature changes. °· Frequent severe bacterial infections, especially certain types of pneumonia and meningitis. °· Pain and swelling in the hands and feet. °· Decreased activity.   °· Loss of appetite.   °· Change in behavior. °· Headaches. °· Seizures. °· Shortness of breath or difficulty breathing. °· Vision changes. °· Skin ulcers. °Those with the trait may not have symptoms or they may have mild symptoms.  °DIAGNOSIS  °Sickle cell anemia is diagnosed with blood tests that demonstrate the genetic trait. It is often diagnosed during the newborn period, due to mandatory testing nationwide. A variety of blood tests, X-rays, CT scans, MRI scans, ultrasounds, and lung function tests may also be done to monitor the condition. °TREATMENT    °Sickle cell anemia may be treated with: °· Medicines. You may be given pain medicines, antibiotic medicines (to treat and prevent infections) or medicines to increase the production of certain types of hemoglobin. °· Fluids. °· Oxygen. °· Blood transfusions. °HOME CARE INSTRUCTIONS  °· Drink enough fluid to keep your urine clear or pale yellow. Increase your fluid intake in hot weather and during exercise. °· Do not smoke. Smoking lowers oxygen levels in the blood.   °· Only take over-the-counter or prescription medicines for pain, fever, or discomfort as directed by your health care provider. °· Take antibiotics as directed by your health care provider. Make sure you finish them it even if you start to feel better.   °· Take supplements as directed by your health care provider.   °· Consider wearing a medical alert bracelet. This tells anyone caring for you in an emergency of your condition.   °· When traveling, keep your medical information, health care provider's names, and the medicines you take with you at all times.   °· If you develop a fever, do not take medicines to reduce the fever right away. This could cover up a problem that is developing. Notify your health care provider. °· Keep all follow-up appointments with your health care provider. Sickle cell anemia requires regular medical care. °SEEK MEDICAL CARE IF: ° You have a fever. °SEEK IMMEDIATE MEDICAL CARE IF:  °· You feel dizzy or faint.   °· You have new abdominal pain, especially on the left side near the stomach area.   °· You develop a persistent, often uncomfortable and painful penile erection (priapism). If this is   not treated immediately it will lead to impotence.   °· You have numbness your arms or legs or you have a hard time moving them.   °· You have a hard time with speech.   °· You have a fever or persistent symptoms for more than 2-3 days.   °· You have a fever and your symptoms suddenly get worse.   °· You have signs or symptoms of  infection. These include:   °¨ Chills.   °¨ Abnormal tiredness (lethargy).   °¨ Irritability.   °¨ Poor eating.   °¨ Vomiting.   °· You develop pain that is not helped with medicine.   °· You develop shortness of breath. °· You have pain in your chest.   °· You are coughing up pus-like or bloody sputum.   °· You develop a stiff neck. °· Your feet or hands swell or have pain. °· Your abdomen appears bloated. °· You develop joint pain. °MAKE SURE YOU: °· Understand these instructions. °  °This information is not intended to replace advice given to you by your health care provider. Make sure you discuss any questions you have with your health care provider. °  °Document Released: 09/16/2005 Document Revised: 06/29/2014 Document Reviewed: 01/18/2013 °Elsevier Interactive Patient Education ©2016 [employer] ° °Sickle Cell Anemia, Adult °Sickle cell anemia is a condition where your red blood cells are shaped like sickles. Red blood cells carry oxygen through the body. Sickle-shaped red blood cells do not live as long as normal red blood cells. They also clump together and block blood from flowing through the blood vessels. These things prevent the body from getting enough oxygen. Sickle cell anemia causes organ damage and pain. It also increases the risk of infection. °HOME CARE °· Drink enough fluid to keep your pee (urine) clear or pale yellow. Drink more in hot weather and during exercise. °· Do not smoke. Smoking lowers oxygen levels in the blood. °· Only take over-the-counter or prescription medicines as told by your doctor. °· Take antibiotic medicines as told by your doctor. Make sure you finish them even if you start to feel better. °· Take supplements as told by your doctor. °· Consider wearing a medical alert bracelet. This tells anyone caring for you in an emergency of your condition. °· When traveling, keep your medical information, doctors' names, and the medicines you take with you at all times. °· If you  have a fever, do not take fever medicines right away. This could cover up a problem. Tell your doctor. °·  Keep all follow-up visits with your doctor. Sickle cell anemia requires regular medical care. °GET HELP IF: °You have a fever. °GET HELP RIGHT AWAY IF: °· You feel dizzy or faint. °· You have new belly (abdominal) pain, especially on the left side near the stomach area. °· You have a lasting, often uncomfortable and painful erection of the penis (priapism). If it is not treated right away, you will become unable to have sex (impotence). °· You have numbness in your arms or legs or you have a hard time moving them. °· You have a hard time talking. °· You have a fever or lasting symptoms for more than 2-3 days. °· You have a fever and your symptoms suddenly get worse. °· You have signs or symptoms of infection. These include: °¨ Chills. °¨ Being more tired than normal (lethargy). °¨ Irritability. °¨ Poor eating. °¨ Throwing up (vomiting). °· You have pain that is not helped with medicine. °· You have shortness of breath. °· You have pain in your chest. °·   You are coughing up pus-like or bloody mucus. °· You have a stiff neck. °· Your feet or hands swell or have pain. °· Your belly looks bloated. °· Your joints hurt. °MAKE SURE YOU: °· Understand these instructions. °· Will watch your condition. °· Will get help right away if you are not doing well or get worse. °  °This information is not intended to replace advice given to you by your health care provider. Make sure you discuss any questions you have with your health care provider. °  °Document Released: 03/29/2013 Document Revised: 10/23/2014 Document Reviewed: 03/29/2013 °Elsevier Interactive Patient Education ©2016 [employer] Inc. ° °

## 2015-05-31 NOTE — Telephone Encounter (Signed)
Patient called complaining of Rt Arm and leg pain, 6/10 and informed that I would call her back after talking with the Provider.She verbalized that she understood.

## 2015-05-31 NOTE — Telephone Encounter (Signed)
Pt called back and told her that she could come to [facility] hospital for treatment after speaking with the provide. Pt voiced understanding and that she could get here in 10 mins.

## 2015-05-31 NOTE — Discharge Summary (Signed)
[facility]   Patient ID: Makayla Fletcher MRN: [id number] DOB/AGE: 1995/09/20 19 y.o.  Admit date: 05/31/2015 Discharge date: 05/31/2015  Primary Care Physician:  Jeanann Lewandowsky, MD  Admission Diagnoses:  Active Problems:   Sickle cell pain crisis [facility])  Discharge Medications:    Medication List    ASK your doctor about these medications        acetaminophen 500 MG tablet  Commonly known as:  TYLENOL  Take 1,000 mg by mouth every 6 (six) hours as needed (for pain.).     ergocalciferol 50000 UNITS capsule  Commonly known as:  DRISDOL  Take 1 capsule (50,000 Units total) by mouth once a week.     folic acid 1 MG tablet  Commonly known as:  FOLVITE  TAKE 1 TABLET (1 MG TOTAL) BY MOUTH DAILY.     ibuprofen 200 MG tablet  Commonly known as:  ADVIL,MOTRIN  Take 400 mg by mouth daily as needed (pain).     oxyCODONE 5 MG immediate release tablet  Commonly known as:  ROXICODONE  Take 1 tablet (5 mg total) by mouth every 6 (six) hours as needed for severe pain.         Consults:  None  Significant Diagnostic Studies:  Dg Chest 2 View  05/26/2015  CLINICAL DATA:  Sickle cell pain crisis. EXAM: CHEST  2 VIEW COMPARISON:  CT of the chest 11/21/2013 FINDINGS: Cardiomediastinal silhouette is upper limits of normal. Mediastinal contours appear intact. There is mild bilateral hilar prominence, likely representing vascular markings. There is no evidence of pleural effusion or pneumothorax. Streaky opacities within the mid right thorax may represent atelectasis or developing airspace consolidation. Osseous structures are without acute abnormality. Soft tissues are grossly normal. IMPRESSION: Upper limits of normal cardiac silhouette. Streaky opacities within the mid right thorax may represent atelectasis or developing airspace consolidation. Electronically Signed   By: Ted Mcalpine M.D.   On: 05/26/2015 18:36     [facility] Course: Patient was admitted to the day infusion center for an acute pain crisis.  Reviewed laboratory values. LDH markedly elevated. Encouraged patient to continue to hydrate post discharge.  Patient was started on a full dose PCA where she used a total of 1.7 mg with 4 demands and 4 deliveries. Pain intensity decreased from 6/10 to 1-2/10. She is alert, oriented, ambulatory, and oxygen saturation is 100% on room air.   Patient will follow up in office in 2 weeks for sickle cell anemia  Patient encouraged to continue hydration   Patient is discharged home with her mother in stable condition.   Physical Exam at Discharge:  BP 102/61 mmHg  Pulse 105  Temp(Src) 98.5 F (36.9 C) (Oral)  Resp 20  SpO2 100%  LMP 05/15/2015 (Exact Date)  BP 102/61 mmHg  Pulse 105  Temp(Src) 98.5 F (36.9 C) (Oral)  Resp 20  SpO2 100%  LMP 05/15/2015 (Exact Date)  General Appearance:    Alert, cooperative, no distress, appears stated age  Head:    Normocephalic, without obvious abnormality, atraumatic  Back:     Symmetric, no curvature, ROM normal, no CVA tenderness  Lungs:     Clear to auscultation bilaterally, respirations unlabored  Chest Wall:    No tenderness or deformity   Heart:    Regular rate and rhythm, S1 and S2 normal, no murmur, rub   or gallop  Abdomen:     Soft, non-tender, bowel sounds active all four  quadrants,    no masses, no organomegaly  Extremities:   Extremities normal, atraumatic, no cyanosis or edema  Pulses:   2+ and symmetric all extremities  Skin:   Skin color, texture, turgor normal, no rashes or lesions  Lymph nodes:   Cervical, supraclavicular, and axillary nodes normal  Neurologic:   CNII-XII intact, normal strength, sensation and reflexes    throughout   Disposition at Discharge: 01-Home or Self Care  Discharge Orders:   Condition at Discharge:   Stable  Time spent on Discharge:  15 minutes  Signed: Bronte Sabado M 05/31/2015, 3:40 PM

## 2015-05-31 NOTE — H&P (Signed)
[facility] History and Physical   Date: 05/31/2015  Patient name: Makayla Fletcher Medical record number: [id number] Date of birth: 04-04-1996 Age: 19 y.o. Gender: female PCP: MATTHEWS,MICHELLE A., MD  Attending physician: Quentin Angstlugbemiga E Jegede, MD  Chief Complaint: right upper and lower extremity pain  History of Present Illness: [name], a [age] year old female with a history of sickle cell anemia, HbSC presents with pain to right upper and right lower extremities. Patient was discharged from inpatient care on 05/28/2015. She states that pain intensity has been hovering around 6 since discharge. She states that she last had Oxycodone 5 mg around 11:45 pm last night and Ibuprofen around 5: 30 am with minimal relief. Patient only takes opiate medication with crisis, she is opiate naive. She describes her current pain intensity as constant and aching.  She attributes current pain crisis to weather changes. She reports fatigue. She denies headache, shortness of breath, chest pain, dysuria, nausea, vomiting, or diarrhea.   Meds: Prescriptions prior to admission  Medication Sig Dispense Refill Last Dose  . acetaminophen (TYLENOL) 500 MG tablet Take 1,000 mg by mouth every 6 (six) hours as needed (for pain.).   Past Week at Unknown time  . ergocalciferol (DRISDOL) 50000 UNITS capsule Take 1 capsule (50,000 Units total) by mouth once a week. 4 capsule 12 05/19/2015  . folic acid (FOLVITE) 1 MG tablet TAKE 1 TABLET (1 MG TOTAL) BY MOUTH DAILY. 30 tablet 6 05/25/2015 at Unknown time  . ibuprofen (ADVIL,MOTRIN) 200 MG tablet Take 400 mg by mouth daily as needed (pain).    Past Week at Unknown time  . oxyCODONE (ROXICODONE) 5 MG immediate release tablet Take 1 tablet (5 mg total) by mouth every 6 (six) hours as needed for severe pain. 30 tablet 0 05/26/2015 at 1000    Allergies: Review of patient's allergies indicates no known allergies. Past Medical History  Diagnosis Date   . Sickle cell anemia (HCC)   . Urinary tract infection July 2013    frequent   . Depression   . Migraine without aura, without mention of intractable migraine without mention of status migrainosus    Past Surgical History  Procedure Laterality Date  . No past surgeries     Family History  Problem Relation Age of Onset  . Migraines Father    Social History   Social History  . Marital Status: Single    Spouse Name: N/A  . Number of Children: 0  . Years of Education: college   Occupational History  . MINOR   . Student     rising freshman at [employer]  . MENTOR/TEACHER    Social History Main Topics  . Smoking status: Never Smoker   . Smokeless tobacco: Never Used  . Alcohol Use: Yes     Comment: Wine  . Drug Use: No  . Sexual Activity:    Partners: Male    Birth Control/ Protection: Pill     Comment: Depoprovera    Other Topics Concern  . Not on file   Social History Narrative    Review of Systems: Constitutional: positive for fatigue Eyes: negative Ears, nose, mouth, throat, and face: negative Respiratory: negative for cough, dyspnea on exertion and wheezing Cardiovascular: negative Gastrointestinal: negative Genitourinary:negative Integument/breast: negative Hematologic/lymphatic: negative Musculoskeletal:positive for myalgias Neurological: negative Behavioral/Psych: negative Endocrine: negative  Physical Exam: Blood pressure 109/69, pulse 119, temperature 98.6 F (37 C), resp. rate 18, last menstrual period 05/15/2015, SpO2 100 %. BP  109/69 mmHg  Pulse 119  Temp(Src) 98.6 F (37 C)  Resp 18  SpO2 100%  LMP 05/15/2015 (Exact Date)  General Appearance:    Alert, cooperative, mild distress, appears stated age  Head:    Normocephalic, without obvious abnormality, atraumatic  Eyes:    PERRL, conjunctiva/corneas clear, EOM's intact, fundi    benign, both eyes  Ears:    Normal TM's and external ear canals, both ears  Nose:   Nares normal, septum  midline, mucosa normal, no drainage    or sinus tenderness  Throat:   Lips, mucosa, and tongue normal; teeth and gums normal  Neck:   Supple, symmetrical, trachea midline, no adenopathy;    thyroid:  no enlargement/tenderness/nodules; no carotid   bruit or JVD  Back:     Symmetric, no curvature, ROM normal, no CVA tenderness  Lungs:     Clear to auscultation bilaterally, respirations unlabored  Chest Wall:    No tenderness or deformity   Heart:    Regular rate and rhythm, S1 and S2 normal, no murmur, rub   or gallop  Abdomen:     Soft, non-tender, bowel sounds active all four quadrants,    no masses, no organomegaly  Extremities:   Extremities normal, atraumatic, no cyanosis or edema. Right upper extremities tender to palpation   Pulses:   2+ and symmetric all extremities  Skin:   Skin color, texture, turgor normal, no rashes or lesions  Lymph nodes:   Cervical, supraclavicular, and axillary nodes normal  Neurologic:   CNII-XII intact, normal strength, sensation and reflexes    throughout    Lab results: No results found for this or any previous visit (from the past 24 hour(s)).  Imaging results:  No results found.   Assessment & Plan:  Patient will be admitted to the day infusion center for extended observation  Start IV D5.45 for cellular rehydration at 125/hr  Start Toradol 30 mg IV every 6 hours for inflammation.  Start Dilaudid PCA per full dose, patient is opiate naive.   Patient will be re-evaluated for pain intensity in the context of function and relationship to baseline as care progresses.  If no significant pain relief, will transfer patient to inpatient services for a higher level of care.   Will check CMP,  LDH, reticulocytes, and CBC w/differential  Reviewed previous chest xray, opacities noted. Pt was given antibiotics while hospitalized and oxygen saturation is 100% on room air.  Hollis,Lachina M 05/31/2015, 10:03 AM

## 2015-05-31 NOTE — Progress Notes (Signed)
Patient verbalized that her pain level is now 1-2/10. Primary informed and ordered to discontinue meds and discharge patient. Patient ambulated out of  Center, accompanied by mother.

## 2015-05-31 NOTE — Telephone Encounter (Signed)
Reviewed [place] Substance Reporting system prior to prescribing, no inconsistencies noted.   Meds ordered this encounter  Medications  . oxyCODONE (ROXICODONE) 5 MG immediate release tablet    Sig: Take 1 tablet (5 mg total) by mouth every 6 (six) hours as needed for severe pain.    Dispense:  60 tablet    Refill:  0    Order Specific Question:  Supervising Provider    Answer:  Quentin AngstJEGEDE, OLUGBEMIGA E [[id number]    Massie MaroonHollis,Lachina M, FNP

## 2015-06-01 ENCOUNTER — Other Ambulatory Visit: Payer: Self-pay | Admitting: Family Medicine

## 2015-06-06 ENCOUNTER — Encounter: Payer: Self-pay | Admitting: Family Medicine

## 2015-06-06 ENCOUNTER — Ambulatory Visit (INDEPENDENT_AMBULATORY_CARE_PROVIDER_SITE_OTHER): Payer: Self-pay | Admitting: Family Medicine

## 2015-06-06 VITALS — BP 107/71 | HR 109 | Temp 98.2°F | Resp 14 | Ht 62.0 in | Wt 109.0 lb

## 2015-06-06 DIAGNOSIS — D57219 Sickle-cell/Hb-C disease with crisis, unspecified: Secondary | ICD-10-CM

## 2015-06-06 DIAGNOSIS — R634 Abnormal weight loss: Secondary | ICD-10-CM

## 2015-06-06 DIAGNOSIS — R5383 Other fatigue: Secondary | ICD-10-CM | POA: Insufficient documentation

## 2015-06-06 DIAGNOSIS — E559 Vitamin D deficiency, unspecified: Secondary | ICD-10-CM

## 2015-06-06 DIAGNOSIS — R000 Tachycardia, unspecified: Secondary | ICD-10-CM

## 2015-06-06 LAB — COMPLETE METABOLIC PANEL WITH GFR
ALT: 12 U/L (ref 6–29)
AST: 20 U/L (ref 10–30)
Albumin: 4.2 g/dL (ref 3.6–5.1)
Alkaline Phosphatase: 64 U/L (ref 33–115)
BILIRUBIN TOTAL: 1.4 mg/dL — AB (ref 0.2–1.2)
BUN: 7 mg/dL (ref 7–25)
CO2: 27 mmol/L (ref 20–31)
CREATININE: 0.51 mg/dL (ref 0.50–1.10)
Calcium: 9.3 mg/dL (ref 8.6–10.2)
Chloride: 97 mmol/L — ABNORMAL LOW (ref 98–110)
GFR, Est Non African American: >89 mL/min (ref >=60)
Glucose, Bld: 83 mg/dL (ref 65–99)
Potassium: 3.8 mmol/L (ref 3.5–5.3)
Sodium: 135 mmol/L (ref 135–146)
TOTAL PROTEIN: 8.7 g/dL — AB (ref 6.1–8.1)

## 2015-06-06 LAB — POCT URINALYSIS DIP (DEVICE)
Bilirubin Urine: NEGATIVE
Glucose, UA: NEGATIVE mg/dL
HGB URINE DIPSTICK: NEGATIVE
Ketones, ur: NEGATIVE mg/dL
LEUKOCYTES UA: NEGATIVE
NITRITE: NEGATIVE
PH: 5.5 (ref 5.0–8.0)
Protein, ur: NEGATIVE mg/dL
Specific Gravity, Urine: 1.02 (ref 1.005–1.030)
UROBILINOGEN UA: 1 mg/dL (ref 0.0–1.0)

## 2015-06-06 LAB — CBC WITH DIFFERENTIAL/PLATELET
Basophils Absolute: 0 10*3/uL (ref 0.0–0.1)
Basophils Relative: 0 % (ref 0–1)
EOS PCT: 2 % (ref 0–5)
Eosinophils Absolute: 0.2 10*3/uL (ref 0.0–0.7)
HCT: 26.6 % — ABNORMAL LOW (ref 36.0–46.0)
HEMOGLOBIN: 8.6 g/dL — AB (ref 12.0–15.0)
LYMPHS PCT: 23 % (ref 12–46)
Lymphs Abs: 2.3 10*3/uL (ref 0.7–4.0)
MCH: 27.7 pg (ref 26.0–34.0)
MCHC: 32.3 g/dL (ref 30.0–36.0)
MCV: 85.8 fL (ref 78.0–100.0)
MPV: 8.9 fL (ref 8.6–12.4)
Monocytes Absolute: 0.9 10*3/uL (ref 0.1–1.0)
Monocytes Relative: 9 % (ref 3–12)
Neutro Abs: 6.5 10*3/uL (ref 1.7–7.7)
Neutrophils Relative %: 66 % (ref 43–77)
Platelets: 197 10*3/uL (ref 150–400)
RBC: 3.1 MIL/uL — AB (ref 3.87–5.11)
RDW: 17.9 % — ABNORMAL HIGH (ref 11.5–15.5)
WBC: 9.8 10*3/uL (ref 4.0–10.5)

## 2015-06-06 LAB — GLUCOSE, CAPILLARY: Glucose-Capillary: 83 mg/dL (ref 65–99)

## 2015-06-06 LAB — TSH: TSH: 0.638 u[IU]/mL (ref 0.350–4.500)

## 2015-06-06 MED ORDER — VITAMIN D (ERGOCALCIFEROL) 1.25 MG (50000 UNIT) PO CAPS: ORAL_CAPSULE | ORAL | Status: DC

## 2015-06-06 NOTE — Patient Instructions (Signed)
Sickle Cell Anemia, Adult Sickle cell anemia is a condition in which red blood cells have an abnormal "sickle" shape. This abnormal shape shortens the cells' life span, which results in a lower than normal concentration of red blood cells in the blood. The sickle shape also causes the cells to clump together and block free blood flow through the blood vessels. As a result, the tissues and organs of the body do not receive enough oxygen. Sickle cell anemia causes organ damage and pain and increases the risk of infection. CAUSES  Sickle cell anemia is a genetic disorder. Those who receive two copies of the gene have the condition, and those who receive one copy have the trait. RISK FACTORS The sickle cell gene is most common in people whose families originated in [place]. Other areas of the globe where sickle cell trait occurs include the Mediterranean, [place] and [place], the [place], and the [place].  SIGNS AND SYMPTOMS  Pain, especially in the extremities, back, chest, or abdomen (common). The pain may start suddenly or may develop following an illness, especially if there is dehydration. Pain can also occur due to overexertion or exposure to extreme temperature changes.  Frequent severe bacterial infections, especially certain types of pneumonia and meningitis.  Pain and swelling in the hands and feet.  Decreased activity.   Loss of appetite.   Change in behavior.  Headaches.  Seizures.  Shortness of breath or difficulty breathing.  Vision changes.  Skin ulcers. Those with the trait may not have symptoms or they may have mild symptoms.  DIAGNOSIS  Sickle cell anemia is diagnosed with blood tests that demonstrate the genetic trait. It is often diagnosed during the newborn period, due to mandatory testing nationwide. A variety of blood tests, X-rays, CT scans, MRI scans, ultrasounds, and lung function tests may also be done to monitor the condition. TREATMENT  Sickle  cell anemia may be treated with:  Medicines. You may be given pain medicines, antibiotic medicines (to treat and prevent infections) or medicines to increase the production of certain types of hemoglobin.  Fluids.  Oxygen.  Blood transfusions. HOME CARE INSTRUCTIONS   Drink enough fluid to keep your urine clear or pale yellow. Increase your fluid intake in hot weather and during exercise.  Do not smoke. Smoking lowers oxygen levels in the blood.   Only take over-the-counter or prescription medicines for pain, fever, or discomfort as directed by your health care provider.  Take antibiotics as directed by your health care provider. Make sure you finish them it even if you start to feel better.   Take supplements as directed by your health care provider.   Consider wearing a medical alert bracelet. This tells anyone caring for you in an emergency of your condition.   When traveling, keep your medical information, health care provider's names, and the medicines you take with you at all times.   If you develop a fever, do not take medicines to reduce the fever right away. This could cover up a problem that is developing. Notify your health care provider.  Keep all follow-up appointments with your health care provider. Sickle cell anemia requires regular medical care. SEEK MEDICAL CARE IF: You have a fever. SEEK IMMEDIATE MEDICAL CARE IF:   You feel dizzy or faint.   You have new abdominal pain, especially on the left side near the stomach area.   You develop a persistent, often uncomfortable and painful penile erection (priapism). If this is not treated immediately it   will lead to impotence.   You have numbness your arms or legs or you have a hard time moving them.   You have a hard time with speech.   You have a fever or persistent symptoms for more than 2-3 days.   You have a fever and your symptoms suddenly get worse.   You have signs or symptoms of infection.  These include:   Chills.   Abnormal tiredness (lethargy).   Irritability.   Poor eating.   Vomiting.   You develop pain that is not helped with medicine.   You develop shortness of breath.  You have pain in your chest.   You are coughing up pus-like or bloody sputum.   You develop a stiff neck.  Your feet or hands swell or have pain.  Your abdomen appears bloated.  You develop joint pain. MAKE SURE YOU:  Understand these instructions.   This information is not intended to replace advice given to you by your health care provider. Make sure you discuss any questions you have with your health care provider.   Document Released: 09/16/2005 Document Revised: 06/29/2014 Document Reviewed: 01/18/2013 Elsevier Interactive Patient Education 2016 Elsevier Inc.  

## 2015-06-06 NOTE — Progress Notes (Signed)
Subjective:    Patient ID: Makayla Fletcher, female    DOB: 1995/09/01, 19 y.o.   MRN: [id number]  HPI  Makayla Fletcher, a 19 year old female with a history of sickle cell anemia, HbSC presents for a post hospital follow up. Patient was admitted to the hospital on 05/26/2015 with a sickle cell crisis. Pain was primarily to right upper extremity.  She states that pain intensity has improved. She maintains that right extremity is sore. She last had Ibuprofen this am with maximum relief. She states that she had Oxycodone on yesterday with satisfactory relief.   She attributed recent pain crisis to changes in weather. She reports that her appetite has been decreased over the past week. She also reports fatigue. Patient denies headache, chest pain, dysuria, nausea, vomiting, or diarrhea.   Past Medical History  Diagnosis Date  . Sickle cell anemia (HCC)   . Urinary tract infection July 2013    frequent   . Depression   . Migraine without aura, without mention of intractable migraine without mention of status migrainosus    Social History   Social History  . Marital Status: Single    Spouse Name: N/A  . Number of Children: 0  . Years of Education: college   Occupational History  . MINOR   . 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 Smoking status: Never Smoker   . Smokeless tobacco: Never Used  . Alcohol Use: Yes     Comment: Wine  . Drug Use: No  . Sexual Activity:    Partners: Male    Birth Control/ Protection: Pill     Comment: Depoprovera    Other Topics Concern  . Not on file   Social History Narrative   Immunization History  Administered Date(s) Administered  . Influenza,inj,Quad PF,36+ Mos 03/13/2014, 05/28/2015  . Pneumococcal Polysaccharide-23 04/24/2014  No Known Allergies Review of Systems  Constitutional: Negative.   HENT: Negative.   Eyes: Negative.   Respiratory: Negative.   Cardiovascular: Negative.    Gastrointestinal: Negative.   Endocrine: Negative.   Genitourinary: Negative.   Musculoskeletal: Positive for myalgias (right upper extremity pain).  Skin: Negative.   Allergic/Immunologic: Negative.   Neurological: Negative.   Hematological: Negative.   Psychiatric/Behavioral: Negative.  Negative for suicidal ideas and sleep disturbance.      Objective:   Physical Exam  Constitutional: She is oriented to person, place, and time. She appears well-developed and well-nourished.  HENT:  Head: Normocephalic and atraumatic.  Right Ear: External ear normal.  Left Ear: External ear normal.  Mouth/Throat: Oropharynx is clear and moist.  Eyes: Conjunctivae and EOM are normal. Pupils are equal, round, and reactive to light.  Neck: Normal range of motion. Neck supple.  Cardiovascular: Normal rate, regular rhythm, normal heart sounds and intact distal pulses.   Pulmonary/Chest: Effort normal and breath sounds normal.  Abdominal: Soft. Bowel sounds are normal.  Musculoskeletal:       Right shoulder: She exhibits tenderness. She exhibits normal range of motion, no swelling, no pain, no spasm and normal strength.  Neurological: She is alert and oriented to person, place, and time. She has normal reflexes.  Skin: Skin is warm and dry.  Psychiatric: She has a normal mood and affect. Her behavior is normal. Judgment and thought content normal.      BP 107/71 mmHg  Pulse 109  Temp(Src) 98.2 F (36.8 C) (Oral)  Resp 14  Ht 5\' 2"  (1.575 m)  Wt 109 lb (49.442 kg)  BMI 19.93 kg/m2  LMP 05/15/2015 (Exact Date) Assessment & Plan:  1. 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  Recommend that Ercel continue hydrating with 64 ounces of water every couple of hours.  - POCT urinalysis dipstick - CBC with Differential - COMPLETE METABOLIC PANEL WITH GFR  2. Vitamin D deficiency Previous vitamin D level was previously 12. Will  continue Drisdol 50, 000 units daily.  - Vitamin D, 25-hydroxy  3. Tachycardia Reviewed previous EKG, sinus tachycardia. Reviewed previous echocardiogram from 04/06/2015 - TSH  5. Other fatigue Previous hemoglobin was 7.3, which is below baseline, will repeat.  - CBC with Differential - Glucose (CBG) - COMPLETE METABOLIC PANEL WITH GFR  RTC: 3 months for sickle cell anemia Garritt Molyneux M, FNP

## 2015-06-07 ENCOUNTER — Telehealth: Payer: Self-pay

## 2015-06-07 LAB — VITAMIN D 25 HYDROXY (VIT D DEFICIENCY, FRACTURES): Vit D, 25-Hydroxy: 53 ng/mL (ref 30–100)

## 2015-06-07 NOTE — Telephone Encounter (Signed)
-----   Message from Massie MaroonLachina M Hollis, [place] sent at 06/07/2015  3:15 PM EST ----- Please inform Ms. Messing that laboratory results have normalized. Hemoglobin is back at baseline level. TSH was negative. Continue to hydrate and take medications consistently. Please follow up in office as needed.   Thanks ----- Message -----    From: [facility]    Sent: 06/06/2015   2:33 PM      To: Massie MaroonLachina M Hollis, FNP

## 2015-06-07 NOTE — Telephone Encounter (Signed)
Called and advised patient of normal labs and to hydrate and take medications as prescribed and to follow up just as needed. Thanks!

## 2015-06-19 ENCOUNTER — Encounter: Payer: Self-pay | Admitting: Family Medicine

## 2015-06-19 ENCOUNTER — Ambulatory Visit (INDEPENDENT_AMBULATORY_CARE_PROVIDER_SITE_OTHER): Payer: Medicaid Other | Admitting: Family Medicine

## 2015-06-19 VITALS — BP 115/72 | HR 109 | Temp 98.2°F | Resp 16 | Ht 62.0 in | Wt 110.0 lb

## 2015-06-19 DIAGNOSIS — R3915 Urgency of urination: Secondary | ICD-10-CM

## 2015-06-19 DIAGNOSIS — R82998 Other abnormal findings in urine: Secondary | ICD-10-CM

## 2015-06-19 DIAGNOSIS — N390 Urinary tract infection, site not specified: Secondary | ICD-10-CM

## 2015-06-19 DIAGNOSIS — R350 Frequency of micturition: Secondary | ICD-10-CM

## 2015-06-19 LAB — POCT URINALYSIS DIP (DEVICE)
Bilirubin Urine: NEGATIVE
GLUCOSE, UA: 100 mg/dL — AB
Ketones, ur: NEGATIVE mg/dL
Nitrite: POSITIVE — AB
PH: 5 (ref 5.0–8.0)
PROTEIN: 30 mg/dL — AB
SPECIFIC GRAVITY, URINE: 1.015 (ref 1.005–1.030)
UROBILINOGEN UA: 1 mg/dL (ref 0.0–1.0)

## 2015-06-19 MED ORDER — CIPROFLOXACIN HCL 250 MG PO TABS: 250.0000 mg | ORAL_TABLET | Freq: Two times a day (BID) | ORAL | Status: DC

## 2015-06-19 NOTE — Patient Instructions (Signed)

## 2015-06-19 NOTE — Progress Notes (Signed)
Subjective:    Patient ID: Makayla Fletcher, female    DOB: Sep 18, 1995, 19 y.o.   MRN: [id number] [name], a 19 year old female with a history of sickle cell anemia presents complaining of burning with urination for 2 days. She also complains of urinary frequency and urgency. She denies fever, chills, fatigue, hematuria, nausea, vomiting,or diarrhea.  Dysuria  This is a new problem. The current episode started in the past 7 days. The problem occurs intermittently. The problem has been unchanged. The quality of the pain is described as burning. The pain is at a severity of 0/10. The pain is moderate. Associated symptoms include flank pain and frequency. Pertinent negatives include no chills, discharge, hematuria, hesitancy, possible pregnancy, sweats, urgency or vomiting.   Past Medical History  Diagnosis Date  . Sickle cell anemia (HCC)   . Urinary tract infection July 2013    frequent   . Depression   . Migraine without aura, without mention of intractable migraine without mention of status migrainosus   No Known Allergies  Immunization History  Administered Date(s) Administered  . Influenza,inj,Quad PF,36+ Mos 03/13/2014, 05/28/2015  . Pneumococcal Polysaccharide-23 04/24/2014   Social History   Social History  . Marital Status: Single    Spouse Name: N/A  . Number of Children: 0  . Years of Education: college   Occupational History  . MINOR   . 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 Smoking status: Never Smoker   . Smokeless tobacco: Never Used  . Alcohol Use: Yes     Comment: Wine  . Drug Use: No  . Sexual Activity:    Partners: Male    Birth Control/ Protection: Pill     Comment: Depoprovera    Other Topics Concern  . Not on file   Social History Narrative     Review of Systems  Constitutional: Negative.  Negative for chills.  HENT: Negative.   Eyes: Negative.   Respiratory: Negative.    Cardiovascular: Negative.   Gastrointestinal: Negative.  Negative for vomiting.  Endocrine: Negative.   Genitourinary: Positive for dysuria, frequency and flank pain. Negative for hesitancy, urgency and hematuria.  Musculoskeletal: Negative.   Skin: Negative.   Allergic/Immunologic: Negative.   Neurological: Negative.   Hematological: Negative.   Psychiatric/Behavioral: Negative.        Objective:   Physical Exam  Constitutional: She is oriented to person, place, and time.  HENT:  Head: Normocephalic and atraumatic.  Right Ear: External ear normal.  Left Ear: External ear normal.  Mouth/Throat: Oropharynx is clear and moist.  Eyes: Conjunctivae and EOM are normal. Pupils are equal, round, and reactive to light.  Pulmonary/Chest: Effort normal and breath sounds normal.  Abdominal: There is tenderness.  Musculoskeletal: Normal range of motion.  Neurological: She is alert and oriented to person, place, and time. She has normal reflexes.  Skin: Skin is warm and dry.  Psychiatric: She has a normal mood and affect. Her behavior is normal. Judgment and thought content normal.      BP 115/72 mmHg  Pulse 109  Temp(Src) 98.2 F (36.8 C) (Oral)  Resp 16  Ht  (1.575 m)  Wt 110 lb (49.896 kg)  BMI 20.11 kg/m2  LMP 05/26/2015 Assessment & Plan:  1. Urinary tract infection, acute Reviewed urinalysis, positive nitrites present. Will start Ciprofloxacin 250 mg BID for 7 days. Recommend that patient increases water intake,  wipe from front to back after urination, and practice good perianal hygiene. Will send urine for culture.  - ciprofloxacin (CIPRO) 250 MG tablet; Take 1 tablet (250 mg total) by mouth 2 (two) times daily.  Dispense: 14 tablet; Refill: 0  2. Urinary frequency - POCT urinalysis dip (device)  3. Urinary urgency  - POCT urinalysis dip (device)  4. Urine leukocytes  - Urine culture  RTC: as previously scheduled  The patient was given clear instructions to  go to ER or return to medical center if symptoms do not improve, worsen or new problems develop. The patient verbalized understanding. Will notify patient with laboratory results. Massie MaroonHollis,Mckena Chern M, FNP

## 2015-06-20 ENCOUNTER — Ambulatory Visit: Payer: Medicaid Other | Admitting: Family Medicine

## 2015-06-22 LAB — URINE CULTURE

## 2015-09-06 ENCOUNTER — Ambulatory Visit: Payer: Medicaid Other | Admitting: Family Medicine

## 2015-09-27 ENCOUNTER — Encounter: Payer: Self-pay | Admitting: Family Medicine

## 2015-09-27 ENCOUNTER — Ambulatory Visit (INDEPENDENT_AMBULATORY_CARE_PROVIDER_SITE_OTHER): Payer: BLUE CROSS/BLUE SHIELD | Admitting: Family Medicine

## 2015-09-27 VITALS — BP 113/68 | HR 97 | Temp 98.2°F | Resp 16 | Ht 63.0 in | Wt 115.0 lb

## 2015-09-27 DIAGNOSIS — D5720 Sickle-cell/Hb-C disease without crisis: Secondary | ICD-10-CM | POA: Diagnosis not present

## 2015-09-27 DIAGNOSIS — E559 Vitamin D deficiency, unspecified: Secondary | ICD-10-CM | POA: Diagnosis not present

## 2015-09-27 LAB — POCT URINALYSIS DIP (DEVICE)
BILIRUBIN URINE: NEGATIVE
GLUCOSE, UA: NEGATIVE mg/dL
Hgb urine dipstick: NEGATIVE
KETONES UR: NEGATIVE mg/dL
LEUKOCYTES UA: NEGATIVE
Nitrite: NEGATIVE
Protein, ur: NEGATIVE mg/dL
SPECIFIC GRAVITY, URINE: 1.015 (ref 1.005–1.030)
Urobilinogen, UA: 0.2 mg/dL (ref 0.0–1.0)
pH: 7 (ref 5.0–8.0)

## 2015-09-27 LAB — COMPLETE METABOLIC PANEL WITH GFR
ALBUMIN: 4.4 g/dL (ref 3.6–5.1)
ALK PHOS: 56 U/L (ref 33–115)
ALT: 9 U/L (ref 6–29)
AST: 17 U/L (ref 10–30)
BILIRUBIN TOTAL: 1.2 mg/dL (ref 0.2–1.2)
BUN: 6 mg/dL — AB (ref 7–25)
CO2: 24 mmol/L (ref 20–31)
CREATININE: 0.64 mg/dL (ref 0.50–1.10)
Calcium: 9.1 mg/dL (ref 8.6–10.2)
Chloride: 104 mmol/L (ref 98–110)
GFR, Est African American: >89 mL/min (ref >=60)
GLUCOSE: 73 mg/dL (ref 65–99)
Potassium: 4.2 mmol/L (ref 3.5–5.3)
SODIUM: 137 mmol/L (ref 135–146)
TOTAL PROTEIN: 7.7 g/dL (ref 6.1–8.1)

## 2015-09-27 LAB — CBC WITH DIFFERENTIAL/PLATELET
Basophils Absolute: 0 cells/uL (ref 0–200)
Basophils Relative: 0 %
EOS PCT: 2 %
Eosinophils Absolute: 152 cells/uL (ref 15–500)
HCT: 32.8 % — ABNORMAL LOW (ref 35.0–45.0)
HEMOGLOBIN: 11.2 g/dL — AB (ref 11.7–15.5)
LYMPHS ABS: 2204 {cells}/uL (ref 850–3900)
Lymphocytes Relative: 29 %
MCH: 28.9 pg (ref 27.0–33.0)
MCHC: 34.1 g/dL (ref 32.0–36.0)
MCV: 84.8 fL (ref 80.0–100.0)
MPV: 10.6 fL (ref 7.5–12.5)
Monocytes Absolute: 532 cells/uL (ref 200–950)
Monocytes Relative: 7 %
NEUTROS ABS: 4712 {cells}/uL (ref 1500–7800)
Neutrophils Relative %: 62 %
PLATELETS: 103 10*3/uL — AB (ref 140–400)
RBC: 3.87 MIL/uL (ref 3.80–5.10)
RDW: 15.4 % — ABNORMAL HIGH (ref 11.0–15.0)
WBC: 7.6 10*3/uL (ref 3.8–10.8)

## 2015-09-27 LAB — RETICULOCYTES
ABS RETIC: 73530 {cells}/uL (ref 20000–80000)
RBC.: 3.87 MIL/uL (ref 3.80–5.10)
Retic Ct Pct: 1.9 %

## 2015-09-27 NOTE — Patient Instructions (Signed)
Sickle Cell Anemia, Adult Sickle cell anemia is a condition in which red blood cells have an abnormal "sickle" shape. This abnormal shape shortens the cells' life span, which results in a lower than normal concentration of red blood cells in the blood. The sickle shape also causes the cells to clump together and block free blood flow through the blood vessels. As a result, the tissues and organs of the body do not receive enough oxygen. Sickle cell anemia causes organ damage and pain and increases the risk of infection. CAUSES  Sickle cell anemia is a genetic disorder. Those who receive two copies of the gene have the condition, and those who receive one copy have the trait. RISK FACTORS The sickle cell gene is most common in people whose families originated in [place]. Other areas of the globe where sickle cell trait occurs include the Mediterranean, [place] and [place], the [place], and the [place].  SIGNS AND SYMPTOMS  Pain, especially in the extremities, back, chest, or abdomen (common). The pain may start suddenly or may develop following an illness, especially if there is dehydration. Pain can also occur due to overexertion or exposure to extreme temperature changes.  Frequent severe bacterial infections, especially certain types of pneumonia and meningitis.  Pain and swelling in the hands and feet.  Decreased activity.   Loss of appetite.   Change in behavior.  Headaches.  Seizures.  Shortness of breath or difficulty breathing.  Vision changes.  Skin ulcers. Those with the trait may not have symptoms or they may have mild symptoms.  DIAGNOSIS  Sickle cell anemia is diagnosed with blood tests that demonstrate the genetic trait. It is often diagnosed during the newborn period, due to mandatory testing nationwide. A variety of blood tests, X-rays, CT scans, MRI scans, ultrasounds, and lung function tests may also be done to monitor the condition. TREATMENT  Sickle  cell anemia may be treated with:  Medicines. You may be given pain medicines, antibiotic medicines (to treat and prevent infections) or medicines to increase the production of certain types of hemoglobin.  Fluids.  Oxygen.  Blood transfusions. HOME CARE INSTRUCTIONS   Drink enough fluid to keep your urine clear or pale yellow. Increase your fluid intake in hot weather and during exercise.  Do not smoke. Smoking lowers oxygen levels in the blood.   Only take over-the-counter or prescription medicines for pain, fever, or discomfort as directed by your health care provider.  Take antibiotics as directed by your health care provider. Make sure you finish them it even if you start to feel better.   Take supplements as directed by your health care provider.   Consider wearing a medical alert bracelet. This tells anyone caring for you in an emergency of your condition.   When traveling, keep your medical information, health care provider's names, and the medicines you take with you at all times.   If you develop a fever, do not take medicines to reduce the fever right away. This could cover up a problem that is developing. Notify your health care provider.  Keep all follow-up appointments with your health care provider. Sickle cell anemia requires regular medical care. SEEK MEDICAL CARE IF: You have a fever. SEEK IMMEDIATE MEDICAL CARE IF:   You feel dizzy or faint.   You have new abdominal pain, especially on the left side near the stomach area.   You develop a persistent, often uncomfortable and painful penile erection (priapism). If this is not treated immediately it   will lead to impotence.   You have numbness your arms or legs or you have a hard time moving them.   You have a hard time with speech.   You have a fever or persistent symptoms for more than 2-3 days.   You have a fever and your symptoms suddenly get worse.   You have signs or symptoms of infection.  These include:   Chills.   Abnormal tiredness (lethargy).   Irritability.   Poor eating.   Vomiting.   You develop pain that is not helped with medicine.   You develop shortness of breath.  You have pain in your chest.   You are coughing up pus-like or bloody sputum.   You develop a stiff neck.  Your feet or hands swell or have pain.  Your abdomen appears bloated.  You develop joint pain. MAKE SURE YOU:  Understand these instructions.   This information is not intended to replace advice given to you by your health care provider. Make sure you discuss any questions you have with your health care provider.   Document Released: 09/16/2005 Document Revised: 06/29/2014 Document Reviewed: 01/18/2013 Elsevier Interactive Patient Education 2016 Elsevier Inc.  

## 2015-09-27 NOTE — Progress Notes (Signed)
Subjective:    Patient ID: Makayla Fletcher, female    DOB: 1996-01-20, 20 y.o.   MRN: [id number]  HPI  Makayla Fletcher, a 20 year old female with a history of sickle cell anemia, HbSC presents for a 6 month follow up. She states that she feels well and is without complaint. She states that sickle cell has been controlled on current medication regimen and she has not had a crisis in greater than 5 months. She currently denies fever, headache, shortness of breath, dysuria, nausea, vomiting, diarrhea, or constipation.  Past Medical History  Diagnosis Date  . Sickle cell anemia (HCC)   . Urinary tract infection July 2013    frequent   . Depression   . Migraine without aura, without mention of intractable migraine without mention of status migrainosus   No Known Allergies  Immunization History  Administered Date(s) Administered  . Influenza,inj,Quad PF,36+ Mos 03/13/2014, 05/28/2015  . Pneumococcal Polysaccharide-23 04/24/2014   Social History   Social History  . Marital Status: Single    Spouse Name: N/A  . Number of Children: 0  . Years of Education: college   Occupational History  . MINOR   . 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 Smoking status: Never Smoker   . Smokeless tobacco: Never Used  . Alcohol Use: Yes     Comment: Wine  . Drug Use: No  . Sexual Activity:    Partners: Male    Birth Control/ Protection: Pill     Comment: Depoprovera    Other Topics Concern  . Not on file   Social History Narrative     Review of Systems  Constitutional: Negative.   HENT: Negative.   Eyes: Negative.   Respiratory: Negative.   Cardiovascular: Negative.   Gastrointestinal: Negative.   Endocrine: Negative.   Musculoskeletal: Negative.   Skin: Negative.   Allergic/Immunologic: Negative.   Neurological: Negative.   Hematological: Negative.   Psychiatric/Behavioral: Negative.        Objective:   Physical Exam   Constitutional: She is oriented to person, place, and time.  HENT:  Head: Normocephalic and atraumatic.  Right Ear: External ear normal.  Left Ear: External ear normal.  Mouth/Throat: Oropharynx is clear and moist.  Eyes: Conjunctivae and EOM are normal. Pupils are equal, round, and reactive to light.  Neck: Normal range of motion. Neck supple.  Pulmonary/Chest: Effort normal and breath sounds normal.  Abdominal: Soft. Bowel sounds are normal.  Musculoskeletal: Normal range of motion.  Neurological: She is alert and oriented to person, place, and time. She has normal reflexes.  Skin: Skin is warm and dry.  Psychiatric: She has a normal mood and affect. Her behavior is normal. Judgment and thought content normal.      BP 113/68 mmHg  Pulse 97  Temp(Src) 98.2 F (36.8 C) (Oral)  Resp 16  Ht  (1.6 m)  Wt 115 lb (52.164 kg)  BMI 20.38 kg/m2  SpO2 100%  LMP 08/28/2015 Assessment & Plan:  1. Sickle cell-hemoglobin C disease without crisis (HCC)  Continue folic acid 1 mg daily to prevent aplastic bone marrow crises.   Pulmonary evaluation - Patient denies severe recurrent wheezes, shortness of breath with exercise, or persistent cough. If these symptoms develop, pulmonary function tests with spirometry will be ordered, and if abnormal, plan on referral to Pulmonology for further evaluation.  Cardiac - Routine screening for pulmonary  hypertension is not recommended.  Eye - High risk of proliferative retinopathy. Annual eye exam with retinal exam recommended to patient.   Vitamin D deficiency - Will check vitamin D level   The above recommendations are taken from the NIH Evidence-Based Management of Sickle Cell Disease: Expert Panel Report, [id number].   - CBC with Differential - COMPLETE METABOLIC PANEL WITH GFR - Reticulocytes  2. Vitamin D deficiency  - Vitamin D, 25-hydroxy  Routine Health Maintanence:  Patient has pap smear scheduled in 1 month with [facility] Recommend barrier protection with sexual intercourse Recommend monthly self breast exam Patient is up to date with vaccinations  The patient was given clear instructions to go to ER or return to medical center if symptoms do not improve, worsen or new problems develop. The patient verbalized understanding. Will notify patient with laboratory results.      RTC: 6 months for sickle cell anemia   Massie MaroonHollis,Silus Lanzo M, FNP

## 2015-09-28 LAB — VITAMIN D 25 HYDROXY (VIT D DEFICIENCY, FRACTURES): Vit D, 25-Hydroxy: 32 ng/mL (ref 30–100)

## 2016-01-18 ENCOUNTER — Encounter (HOSPITAL_COMMUNITY): Payer: Self-pay | Admitting: *Deleted

## 2016-01-18 ENCOUNTER — Ambulatory Visit (HOSPITAL_COMMUNITY)
Admission: EM | Admit: 2016-01-18 | Discharge: 2016-01-18 | Disposition: A | Discharge status: Home / Self Care | Payer: BLUE CROSS/BLUE SHIELD | Attending: Emergency Medicine | Admitting: Emergency Medicine

## 2016-01-18 DIAGNOSIS — N390 Urinary tract infection, site not specified: Secondary | ICD-10-CM | POA: Diagnosis not present

## 2016-01-18 LAB — POCT URINALYSIS DIP (DEVICE)
Bilirubin Urine: NEGATIVE
Glucose, UA: NEGATIVE mg/dL
Ketones, ur: NEGATIVE mg/dL
Nitrite: NEGATIVE
PH: 7 (ref 5.0–8.0)
PROTEIN: NEGATIVE mg/dL
SPECIFIC GRAVITY, URINE: 1.015 (ref 1.005–1.030)
UROBILINOGEN UA: 0.2 mg/dL (ref 0.0–1.0)

## 2016-01-18 LAB — POCT PREGNANCY, URINE: PREG TEST UR: NEGATIVE

## 2016-01-18 MED ORDER — NITROFURANTOIN MONOHYD MACRO 100 MG PO CAPS: 100.0000 mg | ORAL_CAPSULE | Freq: Two times a day (BID) | ORAL | 0 refills | Status: AC

## 2016-01-18 MED ORDER — PHENAZOPYRIDINE HCL 200 MG PO TABS: 200.0000 mg | ORAL_TABLET | Freq: Three times a day (TID) | ORAL | 0 refills | Status: AC

## 2016-01-18 NOTE — ED Provider Notes (Signed)
CSN: [id number]     Arrival date & time 01/18/16  1200 History   None    Chief Complaint  Patient presents with  . Urinary Tract Infection   (Consider location/radiation/quality/duration/timing/severity/associated sxs/prior Treatment) Patient presents today for possible UTI with symptoms of dysuria and urinary frequency. Her symptoms started last night. She denies flank pain, back pain, abdominal pain or nausea.     Urinary Tract Infection  Associated symptoms: no abdominal pain, no fever, no flank pain, no nausea and no vaginal discharge     Past Medical History:  Diagnosis Date  . Depression   . Migraine without aura, without mention of intractable migraine without mention of status migrainosus   . Sickle cell anemia (HCC)   . Urinary tract infection July 2013   frequent    Past Surgical History:  Procedure Laterality Date  . NO PAST SURGERIES     Family History  Problem Relation Age of Onset  . Migraines Father    Social History  Substance Use Topics  . Smoking status: Never Smoker  . Smokeless tobacco: Never Used  . Alcohol use No     Comment: Wine   OB History    No data available     Review of Systems  Constitutional: Negative for fatigue and fever.  Respiratory: Negative for cough and shortness of breath.   Cardiovascular: Negative for chest pain.  Gastrointestinal: Negative for abdominal pain and nausea.  Genitourinary: Positive for dysuria and frequency. Negative for flank pain, urgency and vaginal discharge.    Allergies  Review of patient's allergies indicates no known allergies.  Home Medications   Prior to Admission medications   Medication Sig Start Date End Date Taking? Authorizing Provider  acetaminophen (TYLENOL) 500 MG tablet Take 1,000 mg by mouth every 6 (six) hours as needed (for pain.).    Historical Provider, MD  folic acid (FOLVITE) 1 MG tablet TAKE 1 TABLET (1 MG TOTAL) BY MOUTH DAILY. 02/15/15   Henrietta Hoover, NP  ibuprofen  (ADVIL,MOTRIN) 200 MG tablet Take 400 mg by mouth daily as needed (pain).     Historical Provider, MD  nitrofurantoin, macrocrystal-monohydrate, (MACROBID) 100 MG capsule Take 1 capsule (100 mg total) by mouth 2 (two) times daily. 01/18/16 01/23/16  Lucia Estelle, NP  oxyCODONE (ROXICODONE) 5 MG immediate release tablet Take 1 tablet (5 mg total) by mouth every 6 (six) hours as needed for severe pain. 05/31/15   Massie Maroon, FNP  phenazopyridine (PYRIDIUM) 200 MG tablet Take 1 tablet (200 mg total) by mouth 3 (three) times daily. 01/18/16 01/20/16  Lucia Estelle, NP  Vitamin D, Ergocalciferol, (DRISDOL) 50000 UNITS CAPS capsule TAKE 1 CAPSULE (50,000 UNITS TOTAL) BY MOUTH ONCE A WEEK. 06/06/15   Massie Maroon, FNP   Meds Ordered and Administered this Visit  Medications - No data to display  BP 119/74 (BP Location: Left Arm)   Pulse 86   Temp 98.4 F (36.9 C) (Oral)   Resp 16   LMP 01/11/2016   SpO2 100%  No data found.   Physical Exam  Constitutional: She is oriented to person, place, and time. She appears well-developed and well-nourished.  Cardiovascular: Normal rate, regular rhythm and normal heart sounds.   Pulmonary/Chest: Effort normal and breath sounds normal.  Abdominal: Soft. Bowel sounds are normal.  Genitourinary:  Genitourinary Comments: Negative CVA tenderness  Neurological: She is alert and oriented to person, place, and time.    Urgent Care Course   Clinical Course  Procedures (including critical care time)  Labs Review Labs Reviewed  POCT URINALYSIS DIP (DEVICE) - Abnormal; Notable for the following:       Result Value   Hgb urine dipstick TRACE (*)    Leukocytes, UA SMALL (*)    All other components within normal limits  URINE CULTURE  POCT PREGNANCY, URINE    Imaging Review No results found.   Visual Acuity Review  Right Eye Distance:   Left Eye Distance:   Bilateral Distance:    Right Eye Near:   Left Eye Near:    Bilateral Near:          MDM   1. UTI (lower urinary tract infection)    Patient presents today for possible UTI with urinary frequency and dysuria. Small leukocytes present in her urine. Urine culture ordered. Macrobid and Pyridium prescription given. Patient instructed to follow with her PCP if she does not improve.    Lucia Estelle, NP 01/18/16 1308

## 2016-03-30 ENCOUNTER — Encounter: Payer: Self-pay | Admitting: Family Medicine

## 2016-03-30 ENCOUNTER — Ambulatory Visit (INDEPENDENT_AMBULATORY_CARE_PROVIDER_SITE_OTHER): Payer: Self-pay | Admitting: Family Medicine

## 2016-03-30 VITALS — BP 113/68 | HR 72 | Temp 97.9°F | Resp 12 | Ht 64.0 in | Wt 121.0 lb

## 2016-03-30 DIAGNOSIS — D5700 Hb-SS disease with crisis, unspecified: Secondary | ICD-10-CM

## 2016-03-30 DIAGNOSIS — D571 Sickle-cell disease without crisis: Secondary | ICD-10-CM

## 2016-03-30 DIAGNOSIS — Z23 Encounter for immunization: Secondary | ICD-10-CM

## 2016-03-30 LAB — COMPLETE METABOLIC PANEL WITH GFR
ALBUMIN: 4.3 g/dL (ref 3.6–5.1)
ALK PHOS: 43 U/L (ref 33–115)
ALT: 7 U/L (ref 6–29)
AST: 16 U/L (ref 10–30)
BUN: 5 mg/dL — ABNORMAL LOW (ref 7–25)
CO2: 25 mmol/L (ref 20–31)
Calcium: 9.2 mg/dL (ref 8.6–10.2)
Chloride: 103 mmol/L (ref 98–110)
Creat: 0.66 mg/dL (ref 0.50–1.10)
GFR, Est African American: >89 mL/min (ref >=60)
GLUCOSE: 79 mg/dL (ref 65–99)
POTASSIUM: 3.7 mmol/L (ref 3.5–5.3)
SODIUM: 138 mmol/L (ref 135–146)
Total Bilirubin: 1.1 mg/dL (ref 0.2–1.2)
Total Protein: 8.1 g/dL (ref 6.1–8.1)

## 2016-03-30 LAB — RETICULOCYTES
ABS RETIC: 123000 {cells}/uL — AB (ref 20000–80000)
RBC.: 4.1 MIL/uL (ref 3.80–5.10)
RETIC CT PCT: 3 %

## 2016-03-30 MED ORDER — OXYCODONE HCL 5 MG PO TABS: 5.0000 mg | ORAL_TABLET | Freq: Four times a day (QID) | ORAL | 0 refills | Status: DC | PRN

## 2016-03-31 LAB — CBC WITH DIFFERENTIAL/PLATELET
BASOS ABS: 0 {cells}/uL (ref 0–200)
Basophils Relative: 0 %
EOS ABS: 344 {cells}/uL (ref 15–500)
Eosinophils Relative: 4 %
HEMATOCRIT: 35.5 % (ref 35.0–45.0)
Hemoglobin: 12.4 g/dL (ref 11.7–15.5)
LYMPHS PCT: 30 %
Lymphs Abs: 2580 cells/uL (ref 850–3900)
MCH: 30.9 pg (ref 27.0–33.0)
MCHC: 34.9 g/dL (ref 32.0–36.0)
MCV: 88.5 fL (ref 80.0–100.0)
MONOS PCT: 6 %
MPV: 11.7 fL (ref 7.5–12.5)
Monocytes Absolute: 516 cells/uL (ref 200–950)
NEUTROS PCT: 60 %
Neutro Abs: 5160 cells/uL (ref 1500–7800)
PLATELETS: 138 10*3/uL — AB (ref 140–400)
RBC: 4.01 MIL/uL (ref 3.80–5.10)
RDW: 14.2 % (ref 11.0–15.0)
WBC: 8.6 10*3/uL (ref 3.8–10.8)

## 2016-03-31 LAB — MICROALBUMIN, URINE: Microalb, Ur: <0.2 mg/dL

## 2016-04-01 LAB — VITAMIN D 1,25 DIHYDROXY
VITAMIN D2 1, 25 (OH): 51 pg/mL
Vitamin D 1, 25 (OH)2 Total: 74 pg/mL — ABNORMAL HIGH (ref 18–72)
Vitamin D3 1, 25 (OH)2: 23 pg/mL

## 2016-04-01 NOTE — Progress Notes (Signed)
Makayla Fletcher, is a 20 y.o. female  ZOX:[id number]:[id number]  [id number]  DOB - [birth year]  CC:  Chief Complaint  Patient presents with  . 6 mos folow up    sickle cell 6 month follow up, had one mild crisis in June, stayed at home and used oxycodone, no complaints today, doing well       HPI: Makayla Fletcher is a 20 y.o. female here for follow-up sickle cell Disease. She has very little problems from her sickle cell. Her last oxycodone prescription was almost a year ago. Her last admission for pain management was in Dec 2017. She has some pain in June but managed that at home without probem. She reports no significant change since her last follow-up visit in April. She reports taking her folic acid and Vitamin D regularly as prescribed.  She reports having a PAP in May 2017. She does need a dilated eye exam.  She will receive a Tdap today but declines a flu shot at this time..She denies, chest pain, shortness of breath, palpitations, visual disturbances, or dysuria. She has occ migraine headaches.   No Known Allergies Past Medical History:  Diagnosis Date  . Depression   . Migraine without aura, without mention of intractable migraine without mention of status migrainosus   . Sickle cell anemia (HCC)   . Urinary tract infection July 2013   frequent    Current Outpatient Prescriptions on File Prior to Visit  Medication Sig Dispense Refill  . acetaminophen (TYLENOL) 500 MG tablet Take 1,000 mg by mouth every 6 (six) hours as needed (for pain.).    Marland Kitchen. folic acid (FOLVITE) 1 MG tablet TAKE 1 TABLET (1 MG TOTAL) BY MOUTH DAILY. 30 tablet 6  . ibuprofen (ADVIL,MOTRIN) 200 MG tablet Take 400 mg by mouth daily as needed (pain).     . Vitamin D, Ergocalciferol, (DRISDOL) 50000 UNITS CAPS capsule TAKE 1 CAPSULE (50,000 UNITS TOTAL) BY MOUTH ONCE A WEEK. 4 capsule 11   No current facility-administered medications on file prior to visit.    Family History  Problem Relation Age of Onset  .  Migraines Father    Social History   Social History  . Marital status: Single    Spouse name: N/A  . Number of children: 0  . Years of education: college   Occupational History  . MINOR Unemployed  . Student     rising freshman at [employer]  . 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 Smoking status: Never Smoker  . Smokeless tobacco: Never Used  . Alcohol use No     Comment: Wine  . Drug use: No  . Sexual activity: Yes    Partners: Male    Birth control/ protection: Pill     Comment: Depoprovera    Other Topics Concern  . Not on file   Social History Narrative  . No narrative on file    Review of Systems: See HPI.  Objective:   Vitals:   03/30/16 1451  BP: 113/68  Pulse: 72  Resp: 12  Temp: 97.9 F (36.6 C)    Physical Exam: Constitutional: Patient appears well-developed and well-nourished. No distress. HENT: Normocephalic, atraumatic, External right and left ear normal. Oropharynx is clear and moist.  Eyes: Conjunctivae and EOM are normal. PERRLA, no scleral icterus. Neck: Normal ROM. Neck supple. No lymphadenopathy, No thyromegaly. CVS: RRR, S1/S2 +, no murmurs, no gallops, no rubs Pulmonary: Effort and breath sounds normal, no stridor, rhonchi, wheezes, rales.  Abdominal: Soft. Normoactive BS,, no distension, tenderness, rebound or guarding.  Musculoskeletal: Normal range of motion. No edema and no tenderness.  Neuro: Alert.Normal muscle tone coordination. Non-focal Skin: Skin is warm and dry. No rash noted. Not diaphoretic. No erythema. No pallor. Psychiatric: Normal mood and affect. Behavior, judgment, thought content normal.  Lab Results  Component Value Date   WBC 8.6 03/30/2016   HGB 12.4 03/30/2016   HCT 35.5 03/30/2016   MCV 88.5 03/30/2016   PLT 138 (L) 03/30/2016   Lab Results  Component Value Date   CREATININE 0.66 03/30/2016   BUN 5 (L) 03/30/2016   NA 138 03/30/2016   K 3.7 03/30/2016   CL 103 03/30/2016   CO2  25 03/30/2016    Lab Results  Component Value Date   HGBA1C 4.0 03/15/2014   Lipid Panel  No results found for: CHOL, TRIG, HDL, CHOLHDL, VLDL, LDLCALC     Assessment and plan:   1. Hb-SS disease without crisis (HCC)  - CBC with Differential - Reticulocytes - COMPLETE METABOLIC PANEL WITH GFR - Microalbumin, urine - Vitamin D 1,25 dihydroxy - Ambulatory referral to Ophthalmology - oxyCODONE (ROXICODONE) 5 MG immediate release tablet; Take 1 tablet (5 mg total) by mouth every 6 (six) hours as needed for severe pain.  Dispense: 60 tablet; Refill: 0    2. Need for Tdap vaccination  - Tdap vaccine greater than or equal to 7yo IM   Return in about 6 months (around 09/28/2016).  The patient was given clear instructions to go to ER or return to medical center if symptoms don't improve, worsen or new problems develop. The patient verbalized understanding.    Henrietta Hoover FNP  04/01/2016, 3:19 PM

## 2016-04-01 NOTE — Patient Instructions (Signed)
Continue current medications. Follow-up up in 6 months and as needed. Stay well hydrated.

## 2016-06-06 ENCOUNTER — Other Ambulatory Visit: Payer: Self-pay | Admitting: Family Medicine

## 2016-09-28 ENCOUNTER — Ambulatory Visit: Payer: BLUE CROSS/BLUE SHIELD | Admitting: Family Medicine

## 2016-11-14 ENCOUNTER — Encounter (HOSPITAL_COMMUNITY): Payer: Self-pay | Admitting: *Deleted

## 2016-11-14 ENCOUNTER — Ambulatory Visit (HOSPITAL_COMMUNITY)
Admission: EM | Admit: 2016-11-14 | Discharge: 2016-11-14 | Disposition: A | Discharge status: Home / Self Care | Payer: 59 | Attending: Family Medicine | Admitting: Family Medicine

## 2016-11-14 DIAGNOSIS — R300 Dysuria: Secondary | ICD-10-CM | POA: Diagnosis not present

## 2016-11-14 DIAGNOSIS — R350 Frequency of micturition: Secondary | ICD-10-CM | POA: Diagnosis not present

## 2016-11-14 LAB — POCT URINALYSIS DIP (DEVICE)
Bilirubin Urine: NEGATIVE
Glucose, UA: NEGATIVE mg/dL
HGB URINE DIPSTICK: NEGATIVE
KETONES UR: NEGATIVE mg/dL
Leukocytes, UA: NEGATIVE
Nitrite: NEGATIVE
PH: 5.5 (ref 5.0–8.0)
PROTEIN: 30 mg/dL — AB
Specific Gravity, Urine: 1.025 (ref 1.005–1.030)
Urobilinogen, UA: 0.2 mg/dL (ref 0.0–1.0)

## 2016-11-14 MED ORDER — NITROFURANTOIN MONOHYD MACRO 100 MG PO CAPS: 100.0000 mg | ORAL_CAPSULE | Freq: Two times a day (BID) | ORAL | 0 refills | Status: AC

## 2016-11-14 NOTE — ED Provider Notes (Signed)
CSN: [id number]     Arrival date & time 11/14/16  1734 History   First MD Initiated Contact with Patient 11/14/16 1801     Chief Complaint  Patient presents with  . Urinary Tract Infection   (Consider location/radiation/quality/duration/timing/severity/associated sxs/prior Treatment) Patient is a 21 y.o. Female, here for possible UTI. She reports 7-day duration of dysuria and urinary frequency. She denies urgency and unusual vaginal discharge. She also denies abdominal pain, fever, nausea or flank pain. She have had hx of UTI in the past and it feels similar today.       Past Medical History:  Diagnosis Date  . Depression   . Migraine without aura, without mention of intractable migraine without mention of status migrainosus   . Sickle cell anemia (HCC)   . Urinary tract infection July 2013   frequent    Past Surgical History:  Procedure Laterality Date  . NO PAST SURGERIES     Family History  Problem Relation Age of Onset  . Migraines Father    Social History  Substance Use Topics  . Smoking status: Never Smoker  . Smokeless tobacco: Never Used  . Alcohol use No     Comment: Wine   OB History    No data available     Review of Systems  Constitutional:       See HPI    Allergies  Patient has no known allergies.  Home Medications   Prior to Admission medications   Medication Sig Start Date End Date Taking? Authorizing Provider  acetaminophen (TYLENOL) 500 MG tablet Take 1,000 mg by mouth every 6 (six) hours as needed (for pain.).    [provider]  folic acid (FOLVITE) 1 MG tablet TAKE 1 TABLET (1 MG TOTAL) BY MOUTH DAILY. 02/15/15   Henrietta HooverBernhardt, Linda C, NP  ibuprofen (ADVIL,MOTRIN) 200 MG tablet Take 400 mg by mouth daily as needed (pain).     [provider]  nitrofurantoin, macrocrystal-monohydrate, (MACROBID) 100 MG capsule Take 1 capsule (100 mg total) by mouth 2 (two) times daily. 11/14/16 11/19/16  Lucia EstelleZheng, Jazell Rosenau, NP  oxyCODONE (ROXICODONE) 5  MG immediate release tablet Take 1 tablet (5 mg total) by mouth every 6 (six) hours as needed for severe pain. 03/30/16   Henrietta HooverBernhardt, Linda C, NP  Vitamin D, Ergocalciferol, (DRISDOL) 50000 UNITS CAPS capsule TAKE 1 CAPSULE (50,000 UNITS TOTAL) BY MOUTH ONCE A WEEK. 06/06/15   Massie MaroonHollis, Lachina M, FNP  Vitamin D, Ergocalciferol, (DRISDOL) 50000 units CAPS capsule TAKE 1 CAPSULE (50,000 UNITS TOTAL) BY MOUTH ONCE A WEEK. 06/08/16   Henrietta HooverBernhardt, Linda C, NP   Meds Ordered and Administered this Visit  Medications - No data to display  BP 124/72 (BP Location: Right Arm)   Pulse 82   Temp 98.6 F (37 C) (Oral)   Resp 16   LMP 10/24/2016   SpO2 100%  No data found.   Physical Exam  Constitutional: She is oriented to person, place, and time. She appears well-developed and well-nourished.  HENT:  Head: Normocephalic and atraumatic.  Cardiovascular: Normal rate, regular rhythm and normal heart sounds.   Pulmonary/Chest: Effort normal and breath sounds normal.  Abdominal: Soft. Bowel sounds are normal. There is no tenderness.  Genitourinary:  Genitourinary Comments: Negative CVA tenderness  Neurological: She is alert and oriented to person, place, and time.  Skin: Skin is warm and dry.  Nursing note and vitals reviewed.   Urgent Care Course     Procedures (including critical care time)  Labs Review Labs Reviewed  POCT URINALYSIS DIP (DEVICE) - Abnormal; Notable for the following:       Result Value   Protein, ur 30 (*)    All other components within normal limits  URINE CULTURE    Imaging Review No results found.  MDM   1. Dysuria   2. Urinary frequency    I suspect that you have UTI today. We will send your urine for a urine culture.  Please take the antibiotic as prescribed. (Macrobid BID x 5 days) Follow up with your primary care provider in 1-2 weeks for urine to be recheck due to presence of protein in your urine today.     Lucia Estelle, NP 11/14/16 1810

## 2016-11-14 NOTE — Discharge Instructions (Signed)
I suspect that you have UTI today. We will send your urine for a urine culture.  Please take the antibiotic as prescribed Follow up with your primary care provider in 1-2 weeks for urine to be recheck due to presence of protein in your urine today.

## 2016-11-14 NOTE — ED Triage Notes (Signed)
Pt  Reports     Symptoms  Of burning  On  Urination   With  Frequency     And   Discomfort     Has  Had   Urinary  Tract  infecytions  In past     And  This  Feels   Similar

## 2016-11-15 LAB — URINE CULTURE: Culture: <10000 — AB

## 2016-11-19 ENCOUNTER — Ambulatory Visit: Payer: 59 | Admitting: Family Medicine

## 2016-11-25 ENCOUNTER — Ambulatory Visit: Payer: 59 | Admitting: Family Medicine

## 2017-03-08 ENCOUNTER — Ambulatory Visit (INDEPENDENT_AMBULATORY_CARE_PROVIDER_SITE_OTHER): Payer: 59 | Admitting: Family Medicine

## 2017-03-08 ENCOUNTER — Encounter: Payer: Self-pay | Admitting: Family Medicine

## 2017-03-08 VITALS — BP 117/68 | HR 98 | Temp 98.3°F | Resp 14 | Ht 64.0 in | Wt 123.0 lb

## 2017-03-08 DIAGNOSIS — Z79891 Long term (current) use of opiate analgesic: Secondary | ICD-10-CM

## 2017-03-08 DIAGNOSIS — D571 Sickle-cell disease without crisis: Secondary | ICD-10-CM

## 2017-03-08 DIAGNOSIS — E559 Vitamin D deficiency, unspecified: Secondary | ICD-10-CM

## 2017-03-08 LAB — POCT URINALYSIS DIP (DEVICE)
Bilirubin Urine: NEGATIVE
GLUCOSE, UA: NEGATIVE mg/dL
Ketones, ur: NEGATIVE mg/dL
Leukocytes, UA: NEGATIVE
Nitrite: NEGATIVE
PROTEIN: NEGATIVE mg/dL
Specific Gravity, Urine: 1.025 (ref 1.005–1.030)
UROBILINOGEN UA: 0.2 mg/dL (ref 0.0–1.0)
pH: 5.5 (ref 5.0–8.0)

## 2017-03-08 MED ORDER — OXYCODONE HCL 5 MG PO TABS: 5.0000 mg | ORAL_TABLET | Freq: Four times a day (QID) | ORAL | 0 refills | Status: DC | PRN

## 2017-03-08 MED ORDER — FOLIC ACID 1 MG PO TABS: ORAL_TABLET | ORAL | 6 refills | Status: DC

## 2017-03-08 NOTE — Patient Instructions (Signed)
Sickle Cell Anemia, Adult °Sickle cell anemia is a condition in which red blood cells have an abnormal “sickle” shape. This abnormal shape shortens the cells’ life span, which results in a lower than normal concentration of red blood cells in the blood. The sickle shape also causes the cells to clump together and block free blood flow through the blood vessels. As a result, the tissues and organs of the body do not receive enough oxygen. Sickle cell anemia causes organ damage and pain and increases the risk of infection. °What are the causes? °Sickle cell anemia is a genetic disorder. Those who receive two copies of the gene have the condition, and those who receive one copy have the trait. °What increases the risk? °The sickle cell gene is most common in people whose families originated in [place]. Other areas of the globe where sickle cell trait occurs include the Mediterranean, [place] and [place], the [place], and the [place]. °What are the signs or symptoms? °· Pain, especially in the extremities, back, chest, or abdomen (common). The pain may start suddenly or may develop following an illness, especially if there is dehydration. Pain can also occur due to overexertion or exposure to extreme temperature changes. °· Frequent severe bacterial infections, especially certain types of pneumonia and meningitis. °· Pain and swelling in the hands and feet. °· Decreased activity. °· Loss of appetite. °· Change in behavior. °· Headaches. °· Seizures. °· Shortness of breath or difficulty breathing. °· Vision changes. °· Skin ulcers. °Those with the trait may not have symptoms or they may have mild symptoms. °How is this diagnosed? °Sickle cell anemia is diagnosed with blood tests that demonstrate the genetic trait. It is often diagnosed during the newborn period, due to mandatory testing nationwide. A variety of blood tests, X-rays, CT scans, MRI scans, ultrasounds, and lung function tests may also be done to  monitor the condition. °How is this treated? °Sickle cell anemia may be treated with: °· Medicines. You may be given pain medicines, antibiotic medicines (to treat and prevent infections) or medicines to increase the production of certain types of hemoglobin. °· Fluids. °· Oxygen. °· Blood transfusions. ° °Follow these instructions at home: °· Drink enough fluid to keep your urine clear or pale yellow. Increase your fluid intake in hot weather and during exercise. °· Do not smoke. Smoking lowers oxygen levels in the blood. °· Only take over-the-counter or prescription medicines for pain, fever, or discomfort as directed by your health care provider. °· Take antibiotics as directed by your health care provider. Make sure you finish them it even if you start to feel better. °· Take supplements as directed by your health care provider. °· Consider wearing a medical alert bracelet. This tells anyone caring for you in an emergency of your condition. °· When traveling, keep your medical information, health care provider's names, and the medicines you take with you at all times. °· If you develop a fever, do not take medicines to reduce the fever right away. This could cover up a problem that is developing. Notify your health care provider. °· Keep all follow-up appointments with your health care provider. Sickle cell anemia requires regular medical care. °Contact a health care provider if: °You have a fever. °Get help right away if: °· You feel dizzy or faint. °· You have new abdominal pain, especially on the left side near the stomach area. °· You develop a persistent, often uncomfortable and painful penile erection (priapism). If this is not   treated immediately it will lead to impotence. °· You have numbness your arms or legs or you have a hard time moving them. °· You have a hard time with speech. °· You have a fever or persistent symptoms for more than 2-3 days. °· You have a fever and your symptoms suddenly get  worse. °· You have signs or symptoms of infection. These include: °? Chills. °? Abnormal tiredness (lethargy). °? Irritability. °? Poor eating. °? Vomiting. °· You develop pain that is not helped with medicine. °· You develop shortness of breath. °· You have pain in your chest. °· You are coughing up pus-like or bloody sputum. °· You develop a stiff neck. °· Your feet or hands swell or have pain. °· Your abdomen appears bloated. °· You develop joint pain. °This information is not intended to replace advice given to you by your health care provider. Make sure you discuss any questions you have with your health care provider. °Document Released: 09/16/2005 Document Revised: 12/27/2015 Document Reviewed: 01/18/2013 °Elsevier Interactive Patient Education © 2017 [employer] ° °

## 2017-03-09 ENCOUNTER — Telehealth: Payer: Self-pay

## 2017-03-09 ENCOUNTER — Other Ambulatory Visit: Payer: Self-pay | Admitting: Family Medicine

## 2017-03-09 DIAGNOSIS — E559 Vitamin D deficiency, unspecified: Secondary | ICD-10-CM

## 2017-03-09 LAB — COMPLETE METABOLIC PANEL WITH GFR
AG RATIO: 1.5 (calc) (ref 1.0–2.5)
ALBUMIN MSPROF: 4.4 g/dL (ref 3.6–5.1)
ALT: 8 U/L (ref 6–29)
AST: 16 U/L (ref 10–30)
Alkaline phosphatase (APISO): 50 U/L (ref 33–115)
BILIRUBIN TOTAL: 0.9 mg/dL (ref 0.2–1.2)
BUN / CREAT RATIO: 8 (calc) (ref 6–22)
BUN: 5 mg/dL — ABNORMAL LOW (ref 7–25)
CALCIUM: 9.1 mg/dL (ref 8.6–10.2)
CHLORIDE: 105 mmol/L (ref 98–110)
CO2: 28 mmol/L (ref 20–32)
Creat: 0.62 mg/dL (ref 0.50–1.10)
GFR, EST NON AFRICAN AMERICAN: 127 mL/min/{1.73_m2} (ref >=60)
GFR, Est African American: 147 mL/min/{1.73_m2} (ref >=60)
GLOBULIN: 3 g/dL (ref 1.9–3.7)
GLUCOSE: 96 mg/dL (ref 65–99)
POTASSIUM: 3.8 mmol/L (ref 3.5–5.3)
Sodium: 139 mmol/L (ref 135–146)
Total Protein: 7.4 g/dL (ref 6.1–8.1)

## 2017-03-09 LAB — CBC WITH DIFFERENTIAL/PLATELET
BASOS ABS: 18 {cells}/uL (ref 0–200)
Basophils Relative: 0.3 %
Eosinophils Absolute: 180 cells/uL (ref 15–500)
Eosinophils Relative: 3 %
HCT: 32 % — ABNORMAL LOW (ref 35.0–45.0)
Hemoglobin: 10.6 g/dL — ABNORMAL LOW (ref 11.7–15.5)
Lymphs Abs: 1902 cells/uL (ref 850–3900)
MCH: 29 pg (ref 27.0–33.0)
MCHC: 33.1 g/dL (ref 32.0–36.0)
MCV: 87.4 fL (ref 80.0–100.0)
MONOS PCT: 6.9 %
MPV: 11.7 fL (ref 7.5–12.5)
NEUTROS PCT: 58.1 %
Neutro Abs: 3486 cells/uL (ref 1500–7800)
PLATELETS: 128 10*3/uL — AB (ref 140–400)
RBC: 3.66 10*6/uL — ABNORMAL LOW (ref 3.80–5.10)
RDW: 14.9 % (ref 11.0–15.0)
Total Lymphocyte: 31.7 %
WBC mixed population: 414 cells/uL (ref 200–950)
WBC: 6 10*3/uL (ref 3.8–10.8)

## 2017-03-09 LAB — VITAMIN D 25 HYDROXY (VIT D DEFICIENCY, FRACTURES): VIT D 25 HYDROXY: 14 ng/mL — AB (ref 30–100)

## 2017-03-09 MED ORDER — VITAMIN D (ERGOCALCIFEROL) 1.25 MG (50000 UNIT) PO CAPS: ORAL_CAPSULE | ORAL | 3 refills | Status: DC

## 2017-03-09 NOTE — Telephone Encounter (Signed)
-----   Message from Massie Maroon, [place] sent at 03/09/2017  7:56 AM EDT ----- Regarding: lab results Please inform Makayla Fletcher that labs indicate a vitamin D deficiency. Will start vitamin D replacement at 50,000 IU per week. Will recheck vitamin D level during follow up.   All other labs are within a normal range.   Thanks

## 2017-03-09 NOTE — Progress Notes (Signed)
Meds ordered this encounter  Medications  . Vitamin D, Ergocalciferol, (DRISDOL) 50000 units CAPS capsule    Sig: TAKE 1 CAPSULE (50,000 UNITS TOTAL) BY MOUTH ONCE A WEEK.    Dispense:  12 capsule    Refill:  3    Nolon Nations  MSN, FNP-C Patient [facility] [address] [place], [place] [address] [phone]

## 2017-03-09 NOTE — Telephone Encounter (Signed)
Called and spoke with patient, advised that all labs were wnl besides the vitamin D that was low. Advised patient to take the vitamin D 50,000 units once weekly as directed and that we will follow up at next visit. Patient verbalized understating. Thanks!

## 2017-03-10 NOTE — Progress Notes (Signed)
Subjective:    Patient ID: Makayla Fletcher, female    DOB: 1995/12/27, 21 y.o.   MRN: [id number]  HPI  Ms. Jennette Kettle, a 21 year old female with a history of sickle cell anemia, HbSC presents for a 6 month follow up. She states that she feels well and is without complaint. She states that sickle cell has been controlled on current medication regimen and she has not had a recent crisis.  She currently denies fever, headache, shortness of breath, dysuria, nausea, vomiting, diarrhea, or constipation.  Past Medical History:  Diagnosis Date  . Depression   . Migraine without aura, without mention of intractable migraine without mention of status migrainosus   . Sickle cell anemia (HCC)   . Urinary tract infection July 2013   frequent   No Known Allergies  Immunization History  Administered Date(s) Administered  . Influenza,inj,Quad PF,6+ Mos 03/13/2014, 05/28/2015  . Pneumococcal Polysaccharide-23 04/24/2014  . Tdap 03/30/2016   Social History   Social History  . Marital status: Single    Spouse name: N/A  . Number of children: 0  . Years of education: college   Occupational History  . MINOR Unemployed  . Student     rising freshman at [employer]  . 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 Smoking status: Never Smoker  . Smokeless tobacco: Never Used  . Alcohol use No     Comment: Wine  . Drug use: No  . Sexual activity: Yes    Partners: Male    Birth control/ protection: Pill     Comment: Depoprovera    Other Topics Concern  . Not on file   Social History Narrative  . No narrative on file     Review of Systems  Constitutional: Negative.   HENT: Negative.   Eyes: Negative.   Respiratory: Negative.   Cardiovascular: Negative.   Gastrointestinal: Negative.   Endocrine: Negative.   Musculoskeletal: Negative.   Skin: Negative.   Allergic/Immunologic: Negative.   Neurological: Negative.   Hematological: Negative.   Psychiatric/Behavioral:  Negative.        Objective:   Physical Exam  Constitutional: She is oriented to person, place, and time.  HENT:  Head: Normocephalic and atraumatic.  Right Ear: External ear normal.  Left Ear: External ear normal.  Mouth/Throat: Oropharynx is clear and moist.  Eyes: Pupils are equal, round, and reactive to light. Conjunctivae and EOM are normal.  Neck: Normal range of motion. Neck supple.  Pulmonary/Chest: Effort normal and breath sounds normal.  Abdominal: Soft. Bowel sounds are normal.  Musculoskeletal: Normal range of motion.  Neurological: She is alert and oriented to person, place, and time. She has normal reflexes.  Skin: Skin is warm and dry.  Psychiatric: She has a normal mood and affect. Her behavior is normal. Judgment and thought content normal.      BP 117/68 (BP Location: Right Arm, Patient Position: Sitting, Cuff Size: Normal)   Pulse 98   Temp 98.3 F (36.8 C) (Oral)   Resp 14   Ht  (1.626 m)   Wt 123 lb (55.8 kg)   LMP 03/05/2017   SpO2 100%   BMI 21.11 kg/m  Assessment & Plan:   1. Hb-SS disease without crisis (HCC)  Continue folic acid 1 mg daily to prevent aplastic bone marrow crises.   Pulmonary evaluation - Patient denies severe recurrent wheezes, shortness of breath with exercise, or persistent cough. If these symptoms develop, pulmonary function  tests with spirometry will be ordered, and if abnormal, plan on referral to Pulmonology for further evaluation.  Cardiac - Routine screening for pulmonary hypertension is not recommended.   High risk of proliferative retinopathy. Annual eye exam with retinal exam recommended to patient.  Acute and chronic painful episodes - We agreed on Oxycodone 5 mg every 6 hours for moderate to severe pain #30. We discussed that pt is to receive her Schedule II prescriptions only from [place]. Pt is also aware that the prescription history is available to [place] online through the [facility] CSRS. Controlled substance agreement signed  previously. We reminded Margaret that all patients receiving Schedule II narcotics must be seen for follow within one month of prescription being requested. We reviewed the terms of our pain agreement, including the need to keep medicines in a safe locked location away from children or pets, and the need to report excess sedation or constipation, measures to avoid constipation, and policies related to early refills and stolen prescriptions. According to the [place] [facility] program, we have reviewed details related to analgesia, adverse effects, aberrant behaviors. - COMPLETE METABOLIC PANEL WITH GFR - CBC with Differential - Vitamin D, 25-hydroxy - oxyCODONE (ROXICODONE) 5 MG immediate release tablet; Take 1 tablet (5 mg total) by mouth every 6 (six) hours as needed for severe pain. - Ambulatory referral to Ophthalmology - Pain Mgmt, Profile 8 w/Conf, U - folic acid (FOLVITE) 1 MG tablet; TAKE 1 TABLET (1 MG TOTAL) BY MOUTH DAILY.  2. Vitamin D deficiency  - Vitamin D, 25-hydroxy   3. Chronic prescription opiate use - Pain Mgmt, Profile 8 w/Conf, U     Routine Health Maintanence:  Recommend yearly dental exams Recommend barrier protection with sexual intercourse Recommend monthly self breast exam Patient is up to date with vaccinations  Return in 1 week for influenza vaccination  The patient was given clear instructions to go to ER or return to medical center if symptoms do not improve, worsen or new problems develop. The patient verbalized understanding. Will notify patient with laboratory results.      RTC: 6 months for sickle cell anemia Nolon Nations  MSN, FNP-C Patient Care [facility] [address] [place], [place] [address] [phone]

## 2017-03-13 LAB — PAIN MGMT, PROFILE 8 W/CONF, U
6 Acetylmorphine: NEGATIVE ng/mL (ref <10)
AMPHETAMINES: NEGATIVE ng/mL (ref <500)
Alcohol Metabolites: NEGATIVE ng/mL (ref <500)
Benzodiazepines: NEGATIVE ng/mL (ref <100)
Buprenorphine, Urine: NEGATIVE ng/mL (ref <5)
COCAINE METABOLITE: NEGATIVE ng/mL (ref <150)
CODEINE: NEGATIVE ng/mL (ref <50)
Creatinine: 186.7 mg/dL
HYDROMORPHONE: NEGATIVE ng/mL (ref <50)
Hydrocodone: NEGATIVE ng/mL (ref <50)
MARIJUANA METABOLITE: NEGATIVE ng/mL (ref <20)
MDMA: NEGATIVE ng/mL (ref <500)
Morphine: 84 ng/mL — ABNORMAL HIGH (ref <50)
Norhydrocodone: NEGATIVE ng/mL (ref <50)
OXYCODONE: NEGATIVE ng/mL (ref <100)
Opiates: POSITIVE ng/mL — AB (ref <100)
Oxidant: NEGATIVE ug/mL (ref <200)
pH: 6.62 (ref 4.5–9.0)

## 2017-03-16 ENCOUNTER — Ambulatory Visit (INDEPENDENT_AMBULATORY_CARE_PROVIDER_SITE_OTHER): Payer: 59 | Admitting: Family Medicine

## 2017-03-16 DIAGNOSIS — Z23 Encounter for immunization: Secondary | ICD-10-CM

## 2017-03-17 ENCOUNTER — Other Ambulatory Visit: Payer: Self-pay | Admitting: Family Medicine

## 2017-03-17 DIAGNOSIS — Z111 Encounter for screening for respiratory tuberculosis: Secondary | ICD-10-CM

## 2017-03-19 ENCOUNTER — Other Ambulatory Visit (INDEPENDENT_AMBULATORY_CARE_PROVIDER_SITE_OTHER): Payer: 59

## 2017-03-19 DIAGNOSIS — Z111 Encounter for screening for respiratory tuberculosis: Secondary | ICD-10-CM

## 2017-03-20 LAB — QUANTIFERON TB GOLD ASSAY (BLOOD)
Mitogen-Nil: >10 IU/mL
QUANTIFERON NIL VALUE: 0.07 [IU]/mL
QUANTIFERON(R)-TB GOLD: NEGATIVE
Quantiferon Tb Ag Minus Nil Value: 0.03 IU/mL

## 2017-03-24 DIAGNOSIS — Z113 Encounter for screening for infections with a predominantly sexual mode of transmission: Secondary | ICD-10-CM | POA: Diagnosis not present

## 2017-05-25 ENCOUNTER — Telehealth (HOSPITAL_COMMUNITY): Payer: Self-pay | Admitting: *Deleted

## 2017-05-25 NOTE — Telephone Encounter (Signed)
[facility]. Unable to take call at the time. Attempted to call patient back at given number with no answer/response or busy. Unable to leave message at this time.

## 2017-07-05 ENCOUNTER — Inpatient Hospital Stay (HOSPITAL_COMMUNITY): Payer: Self-pay

## 2017-07-05 ENCOUNTER — Encounter (HOSPITAL_COMMUNITY): Payer: Self-pay | Admitting: *Deleted

## 2017-07-05 ENCOUNTER — Inpatient Hospital Stay (HOSPITAL_COMMUNITY)
Admission: AD | Admit: 2017-07-05 | Discharge: 2017-07-06 | Disposition: A | Discharge status: Home / Self Care | Payer: Self-pay | Source: Ambulatory Visit | Attending: Obstetrics and Gynecology | Admitting: Obstetrics and Gynecology

## 2017-07-05 DIAGNOSIS — O4690 Antepartum hemorrhage, unspecified, unspecified trimester: Secondary | ICD-10-CM

## 2017-07-05 DIAGNOSIS — O209 Hemorrhage in early pregnancy, unspecified: Secondary | ICD-10-CM | POA: Insufficient documentation

## 2017-07-05 DIAGNOSIS — O3680X0 Pregnancy with inconclusive fetal viability, not applicable or unspecified: Secondary | ICD-10-CM

## 2017-07-05 DIAGNOSIS — Z3A01 Less than 8 weeks gestation of pregnancy: Secondary | ICD-10-CM | POA: Insufficient documentation

## 2017-07-05 LAB — CBC
HCT: 29.6 % — ABNORMAL LOW (ref 36.0–46.0)
HEMOGLOBIN: 10.8 g/dL — AB (ref 12.0–15.0)
MCH: 29 pg (ref 26.0–34.0)
MCHC: 36.5 g/dL — ABNORMAL HIGH (ref 30.0–36.0)
MCV: 79.6 fL (ref 78.0–100.0)
Platelets: 103 10*3/uL — ABNORMAL LOW (ref 150–400)
RBC: 3.72 MIL/uL — AB (ref 3.87–5.11)
RDW: 14.6 % (ref 11.5–15.5)
WBC: 7.5 10*3/uL (ref 4.0–10.5)

## 2017-07-05 LAB — URINALYSIS, ROUTINE W REFLEX MICROSCOPIC
Bacteria, UA: NONE SEEN
Bilirubin Urine: NEGATIVE
GLUCOSE, UA: NEGATIVE mg/dL
Ketones, ur: 5 mg/dL — AB
Leukocytes, UA: NEGATIVE
Nitrite: NEGATIVE
PH: 5 (ref 5.0–8.0)
Protein, ur: 30 mg/dL — AB
Specific Gravity, Urine: 1.017 (ref 1.005–1.030)

## 2017-07-05 LAB — POCT PREGNANCY, URINE: Preg Test, Ur: NEGATIVE

## 2017-07-05 LAB — HCG, QUANTITATIVE, PREGNANCY: hCG, Beta Chain, Quant, S: 47 m[IU]/mL — ABNORMAL HIGH (ref <5)

## 2017-07-05 LAB — HCG, SERUM, QUALITATIVE: PREG SERUM: POSITIVE — AB

## 2017-07-05 NOTE — MAU Provider Note (Signed)
Patient Makayla Fletcher is a 22 y.o. pregnant female here with complaints of spotting that started today.   She denies abdominal pain, NV, backache, dysuria, abnormal discharge.   History     CSN: [id number]  Arrival date and time: 07/05/17 1804   None     Chief Complaint  Patient presents with  . Vaginal Bleeding   Vaginal Bleeding  The patient's primary symptoms include vaginal bleeding. The patient's pertinent negatives include no genital itching, genital lesions, pelvic pain or vaginal discharge. This is a new problem. The current episode started today. The problem occurs intermittently. The problem has been unchanged. The patient is experiencing no pain. Pertinent negatives include no abdominal pain, back pain, constipation, diarrhea, urgency or vomiting. The vaginal discharge was normal. The vaginal bleeding is spotting. She has not been passing clots. She has not been passing tissue. She uses nothing for contraception.    OB History    No data available      Past Medical History:  Diagnosis Date  . Depression   . Migraine without aura, without mention of intractable migraine without mention of status migrainosus   . Sickle cell anemia (HCC)   . Urinary tract infection July 2013   frequent     Past Surgical History:  Procedure Laterality Date  . NO PAST SURGERIES      Family History  Problem Relation Age of Onset  . Migraines Father     Social History   Tobacco Use  . Smoking status: Never Smoker  . Smokeless tobacco: Never Used  Substance Use Topics  . Alcohol use: No    Comment: Wine  . Drug use: No    Allergies: No Known Allergies  Medications Prior to Admission  Medication Sig Dispense Refill Last Dose  . acetaminophen (TYLENOL) 500 MG tablet Take 1,000 mg by mouth every 6 (six) hours as needed (for pain.).   Taking  . folic acid (FOLVITE) 1 MG tablet TAKE 1 TABLET (1 MG TOTAL) BY MOUTH DAILY. 30 tablet 6   . ibuprofen (ADVIL,MOTRIN) 200 MG  tablet Take 400 mg by mouth daily as needed (pain).    Taking  . oxyCODONE (ROXICODONE) 5 MG immediate release tablet Take 1 tablet (5 mg total) by mouth every 6 (six) hours as needed for severe pain. 15 tablet 0   . Vitamin D, Ergocalciferol, (DRISDOL) 50000 units CAPS capsule TAKE 1 CAPSULE (50,000 UNITS TOTAL) BY MOUTH ONCE A WEEK. 12 capsule 3     Review of Systems  Constitutional: Negative.   HENT: Negative.   Respiratory: Negative.   Gastrointestinal: Negative.  Negative for abdominal pain, constipation, diarrhea and vomiting.  Genitourinary: Positive for vaginal bleeding. Negative for pelvic pain, urgency, vaginal discharge and vaginal pain.  Musculoskeletal: Negative.  Negative for back pain.  Neurological: Negative.    Physical Exam   Blood pressure 127/82, pulse (!) 106, temperature 98.4 F (36.9 C), temperature source Oral, resp. rate 16, height 5\' 2"  (1.575 m), weight 115 lb (52.2 kg), last menstrual period 06/01/2017.  Physical Exam  Constitutional: She is oriented to person, place, and time. She appears well-developed.  HENT:  Head: Normocephalic.  Neck: Normal range of motion.  GI: Soft.  Genitourinary:  Genitourinary Comments: NEFG; trace amount of dark brown blood in the vagina. No CMT, adnexal or suprapubic tenderness.   Musculoskeletal: Normal range of motion.  Neurological: She is alert and oriented to person, place, and time.  Skin: Skin is warm and dry.  Psychiatric:  She has a normal mood and affect.    MAU Course  Procedures  MDM [place] -US shows no IUP; beta HCG is 47  Assessment and Plan   1. Pregnancy of unknown anatomic location   2. Vaginal bleeding during pregnancy    2. Strict ectopc precautions given 3. Patient to follow up with [facility] in two days for repeat beta 4. Reviewed strict ectopic precautions with patient and warning signs of when to return to MAU.  5. Patient verbalized understanding; all questions answered.    Charlesetta Garibaldi Kooistra 07/05/2017, 11:12 PM

## 2017-07-05 NOTE — MAU Note (Signed)
Pt had pos HPT last week, started spotting today, denies pain.

## 2017-07-06 DIAGNOSIS — O4691 Antepartum hemorrhage, unspecified, first trimester: Secondary | ICD-10-CM

## 2017-07-06 NOTE — Discharge Instructions (Signed)
Ectopic Pregnancy °An ectopic pregnancy happens when a fertilized egg grows outside the uterus. A pregnancy cannot live outside of the uterus. This problem often happens in the fallopian tube. It is often caused by damage to the fallopian tube. °If this problem is found early, you may be treated with medicine. If your tube tears or bursts open (ruptures), you will bleed inside. This is an emergency. You will need surgery. Get help right away. °What are the signs or symptoms? °You may have normal pregnancy symptoms at first. These include: °· Missing your period. °· Feeling sick to your stomach (nauseous). °· Being tired. °· Having tender breasts. ° °Then, you may start to have symptoms that are not normal. These include: °· Pain with sex (intercourse). °· Bleeding from the vagina. This includes light bleeding (spotting). °· Belly (abdomen) or lower belly cramping or pain. This may be felt on one side. °· A fast heartbeat (pulse). °· Passing out (fainting) after going poop (bowel movement). ° °If your tube tears, you may have symptoms such as: °· Really bad pain in the belly or lower belly. This happens suddenly. °· Dizziness. °· Passing out. °· Shoulder pain. ° °Get help right away if: °You have any of these symptoms. This is an emergency. °This information is not intended to replace advice given to you by your health care provider. Make sure you discuss any questions you have with your health care provider. °Document Released: 09/04/2008 Document Revised: 11/14/2015 Document Reviewed: 01/18/2013 °Elsevier Interactive Patient Education © 2017 Elsevier Inc. ° °

## 2017-09-06 ENCOUNTER — Ambulatory Visit: Payer: 59 | Admitting: Family Medicine

## 2017-09-09 ENCOUNTER — Ambulatory Visit (HOSPITAL_COMMUNITY)
Admission: EM | Admit: 2017-09-09 | Discharge: 2017-09-09 | Disposition: A | Discharge status: Home / Self Care | Payer: Medicaid Other | Attending: Family Medicine | Admitting: Family Medicine

## 2017-09-09 ENCOUNTER — Encounter (HOSPITAL_COMMUNITY): Payer: Self-pay | Admitting: Family Medicine

## 2017-09-09 DIAGNOSIS — N926 Irregular menstruation, unspecified: Secondary | ICD-10-CM

## 2017-09-09 DIAGNOSIS — Z3201 Encounter for pregnancy test, result positive: Secondary | ICD-10-CM

## 2017-09-09 DIAGNOSIS — N644 Mastodynia: Secondary | ICD-10-CM

## 2017-09-09 LAB — POCT PREGNANCY, URINE: Preg Test, Ur: POSITIVE — AB

## 2017-09-09 NOTE — ED Provider Notes (Signed)
[facility]    CSN: [id number] Arrival date & time: 09/09/17  1740     History   Chief Complaint Chief Complaint  Patient presents with  . Possible Pregnancy    HPI Makayla Fletcher is a 22 y.o. female.   22 year old female, with history of sickle cell anemia, depression, migraines, presenting today for a pregnancy test.  Patient states that her last period was 2/17.  States that she is supposed to start her period today but is late.  States that she is also has some soreness to her breasts bilaterally.  The history is provided by the patient.  Possible Pregnancy  This is a new problem. The current episode started yesterday. The problem occurs constantly. The problem has not changed since onset.Pertinent negatives include no chest pain, no abdominal pain, no headaches and no shortness of breath. Nothing aggravates the symptoms. Nothing relieves the symptoms. She has tried nothing for the symptoms. The treatment provided no relief.    Past Medical History:  Diagnosis Date  . Depression   . Migraine without aura, without mention of intractable migraine without mention of status migrainosus   . Sickle cell anemia (HCC)   . Urinary tract infection July 2013   frequent     Patient Active Problem List   Diagnosis Date Noted  . Other fatigue 06/06/2015  . Vitamin D deficiency 06/06/2015  . Sickle cell pain crisis (HCC) 05/26/2015  . Sickle cell anemia with pain (HCC) 03/25/2015  . Hypoglycemia 03/20/2014  . Irregular heartbeat 03/13/2014  . Heart palpitations 03/13/2014  . Migraine without aura, without mention of intractable migraine without mention of status migrainosus 12/18/2013  . Dizziness 08/24/2013  . 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 Sinus tachycardia (HCC) 06/18/2012  . Hemoglobin  with crisis (HCC) 06/18/2012  . Menorrhagia with irregular cycle 06/18/2012  . Sickle cell anemia (HCC) 06/17/2012  . Hyponatremia  06/17/2012  . Depression, major, single episode, moderate (HCC) 07/31/2011  . Generalized anxiety disorder 07/31/2011    Past Surgical History:  Procedure Laterality Date  . NO PAST SURGERIES      OB History   None      Home Medications    Prior to Admission medications   Medication Sig Start Date End Date Taking? Authorizing Provider  acetaminophen (TYLENOL) 500 MG tablet Take 1,000 mg by mouth every 6 (six) hours as needed (for pain.).    [provider]  folic acid (FOLVITE) 1 MG tablet TAKE 1 TABLET (1 MG TOTAL) BY MOUTH DAILY. 03/08/17   Massie MaroonHollis, Lachina M, FNP  ibuprofen (ADVIL,MOTRIN) 200 MG tablet Take 400 mg by mouth daily as needed (pain).     [provider]  oxyCODONE (ROXICODONE) 5 MG immediate release tablet Take 1 tablet (5 mg total) by mouth every 6 (six) hours as needed for severe pain. 03/08/17   Massie MaroonHollis, Lachina M, FNP  Vitamin D, Ergocalciferol, (DRISDOL) 50000 units CAPS capsule TAKE 1 CAPSULE (50,000 UNITS TOTAL) BY MOUTH ONCE A WEEK. 03/09/17   Massie MaroonHollis, Lachina M, FNP    Family History Family History  Problem Relation Age of Onset  . Migraines Father     Social History Social History   Tobacco Use  . Smoking status: Never Smoker  . Smokeless tobacco: Never Used  Substance Use Topics  . Alcohol use: No    Comment: Wine  . Drug use: No     Allergies   Patient has no known allergies.  Review of Systems Review of Systems  Constitutional: Negative for chills and fever.  HENT: Negative for ear pain and sore throat.   Eyes: Negative for pain and visual disturbance.  Respiratory: Negative for cough and shortness of breath.   Cardiovascular: Negative for chest pain and palpitations.  Gastrointestinal: Negative for abdominal pain and vomiting.  Genitourinary: Negative for dysuria and hematuria.  Musculoskeletal: Negative for arthralgias and back pain.  Skin: Negative for color change and rash.  Neurological: Negative for seizures,  syncope and headaches.  All other systems reviewed and are negative.    Physical Exam Triage Vital Signs ED Triage Vitals  Enc Vitals Group     BP 09/09/17 1819 114/71     Pulse Rate 09/09/17 1819 98     Resp 09/09/17 1819 18     Temp 09/09/17 1819 98.7 F (37.1 C)     Temp src --      SpO2 09/09/17 1819 100 %     Weight --      Height --      Head Circumference --      Peak Flow --      Pain Score 09/09/17 1818 0     Pain Loc --      Pain Edu? --      Excl. in GC? --    No data found.  Updated Vital Signs BP 114/71   Pulse 98   Temp 98.7 F (37.1 C)   Resp 18   LMP 08/08/2017   SpO2 100%   Visual Acuity Right Eye Distance:   Left Eye Distance:   Bilateral Distance:    Right Eye Near:   Left Eye Near:    Bilateral Near:     Physical Exam  Constitutional: She appears well-developed and well-nourished. No distress.  HENT:  Head: Normocephalic and atraumatic.  Eyes: Conjunctivae are normal.  Neck: Neck supple.  Cardiovascular: Normal rate and regular rhythm.  No murmur heard. Pulmonary/Chest: Effort normal and breath sounds normal. No respiratory distress.  Abdominal: Soft. There is no tenderness.  Musculoskeletal: She exhibits no edema.  Neurological: She is alert.  Skin: Skin is warm and dry.  Psychiatric: She has a normal mood and affect.  Nursing note and vitals reviewed.    UC Treatments / Results  Labs (all labs ordered are listed, but only abnormal results are displayed) Labs Reviewed  POCT PREGNANCY, URINE - Abnormal; Notable for the following components:      Result Value   Preg Test, Ur POSITIVE (*)    All other components within normal limits    EKG  EKG Interpretation None       Radiology No results found.  Procedures Procedures (including critical care time)  Medications Ordered in UC Medications - No data to display   Initial Impression / Assessment and Plan / UC Course  I have reviewed the triage vital signs and  the nursing notes.  Pertinent labs & imaging results that were available during my care of the patient were reviewed by me and considered in my medical decision making (see chart for details).     Pregnancy test positive.  Patient will call her OB/GYN first thing in the morning.  Final Clinical Impressions(s) / UC Diagnoses   Final diagnoses:  Positive pregnancy test    ED Discharge Orders    None       Controlled Substance Prescriptions [place] Controlled Substance Registry consulted? Not Applicable   Alecia Lemming, [place] 09/09/17 1920

## 2017-09-09 NOTE — ED Notes (Signed)
Patient discharged by provider.

## 2017-09-09 NOTE — ED Triage Notes (Signed)
Pt here for preg test. She had positive one at home. Pt just had a miscarriage 2 months ago.

## 2017-09-11 ENCOUNTER — Inpatient Hospital Stay (HOSPITAL_COMMUNITY): Payer: Self-pay

## 2017-09-11 ENCOUNTER — Encounter (HOSPITAL_COMMUNITY): Payer: Self-pay

## 2017-09-11 ENCOUNTER — Inpatient Hospital Stay (HOSPITAL_COMMUNITY)
Admission: AD | Admit: 2017-09-11 | Discharge: 2017-09-11 | Disposition: A | Discharge status: Home / Self Care | Payer: Self-pay | Source: Ambulatory Visit | Attending: Obstetrics and Gynecology | Admitting: Obstetrics and Gynecology

## 2017-09-11 DIAGNOSIS — O209 Hemorrhage in early pregnancy, unspecified: Secondary | ICD-10-CM | POA: Insufficient documentation

## 2017-09-11 DIAGNOSIS — Z3A01 Less than 8 weeks gestation of pregnancy: Secondary | ICD-10-CM | POA: Insufficient documentation

## 2017-09-11 LAB — URINALYSIS, ROUTINE W REFLEX MICROSCOPIC
Bilirubin Urine: NEGATIVE
GLUCOSE, UA: NEGATIVE mg/dL
HGB URINE DIPSTICK: NEGATIVE
Ketones, ur: NEGATIVE mg/dL
Leukocytes, UA: NEGATIVE
Nitrite: NEGATIVE
Protein, ur: NEGATIVE mg/dL
SPECIFIC GRAVITY, URINE: 1.013 (ref 1.005–1.030)
pH: 7 (ref 5.0–8.0)

## 2017-09-11 LAB — WET PREP, GENITAL
SPERM: NONE SEEN
Trich, Wet Prep: NONE SEEN
Yeast Wet Prep HPF POC: NONE SEEN

## 2017-09-11 LAB — HCG, QUANTITATIVE, PREGNANCY: hCG, Beta Chain, Quant, S: 26 m[IU]/mL — ABNORMAL HIGH (ref <5)

## 2017-09-11 NOTE — MAU Note (Signed)
Pt states she wiped today and had a bit of light pink spotting on the tissue.  Denies any pain.  She said she has had a miscarriage before, beginning of this January and she got scared when she saw the pink on the tissue.

## 2017-09-11 NOTE — MAU Provider Note (Signed)
History   G2P0010 @ apx 4-[redacted] wks gestation in with pink vag bleeding that started this morning. Denies any pain at this time. Pt is very apprehensive since she lost pregnancy in January.  CSN: [id number]  Arrival date & time 09/11/17  1112   None     Chief Complaint  Patient presents with  . Vaginal Bleeding    HPI  Past Medical History:  Diagnosis Date  . Depression   . Migraine without aura, without mention of intractable migraine without mention of status migrainosus   . Sickle cell anemia (HCC)   . Urinary tract infection July 2013   frequent     Past Surgical History:  Procedure Laterality Date  . NO PAST SURGERIES      Family History  Problem Relation Age of Onset  . Migraines Father     Social History   Tobacco Use  . Smoking status: Never Smoker  . Smokeless tobacco: Never Used  Substance Use Topics  . Alcohol use: No    Comment: Wine  . Drug use: No    OB History    Gravida  2   Para      Term      Preterm      AB  1   Living        SAB  1   TAB      Ectopic      Multiple      Live Births              Review of Systems  Constitutional: Negative.   HENT: Negative.   Eyes: Negative.   Respiratory: Negative.   Cardiovascular: Negative.   Gastrointestinal: Negative.   Endocrine: Negative.   Genitourinary: Positive for vaginal bleeding.  Musculoskeletal: Negative.   Skin: Negative.   Allergic/Immunologic: Negative.   Neurological: Negative.   Hematological: Negative.   Psychiatric/Behavioral: Negative.     Allergies  Patient has no known allergies.  Home Medications    BP (!) 144/77 (BP Location: Left Arm)   Pulse (!) 108   Temp 98 F (36.7 C) (Oral)   Resp 16   LMP 08/08/2017   SpO2 100%   Physical Exam  Constitutional: She is oriented to person, place, and time. She appears well-developed and well-nourished.  HENT:  Head: Normocephalic.  Neck: Normal range of motion.  Cardiovascular: Normal rate,  regular rhythm, normal heart sounds and intact distal pulses.  Pulmonary/Chest: Effort normal and breath sounds normal.  Abdominal: Soft. Bowel sounds are normal.  Genitourinary: Vaginal discharge found.  Genitourinary Comments: spottin amt pink vag bleeding.  Musculoskeletal: Normal range of motion.  Neurological: She is alert and oriented to person, place, and time. She has normal reflexes.  Skin: Skin is warm and dry.  Psychiatric: She has a normal mood and affect. Her behavior is normal. Judgment and thought content normal.    MAU Course  Procedures (including critical care time)  Labs Reviewed  WET PREP, GENITAL  URINALYSIS, ROUTINE W REFLEX MICROSCOPIC  GC/CHLAMYDIA PROBE AMP (Diagonal) NOT AT [facility]   No results found. Results for orders placed or performed during the hospital encounter of 09/11/17 (from the past 24 hour(s))  Urinalysis, Routine w reflex microscopic     Status: None   Collection Time: 09/11/17 11:20 AM  Result Value Ref Range   Color, Urine YELLOW YELLOW   APPearance CLEAR CLEAR   Specific Gravity, Urine 1.013 1.005 - 1.030   pH 7.0 5.0 - 8.0  Glucose, UA NEGATIVE NEGATIVE mg/dL   Hgb urine dipstick NEGATIVE NEGATIVE   Bilirubin Urine NEGATIVE NEGATIVE   Ketones, ur NEGATIVE NEGATIVE mg/dL   Protein, ur NEGATIVE NEGATIVE mg/dL   Nitrite NEGATIVE NEGATIVE   Leukocytes, UA NEGATIVE NEGATIVE  Wet prep, genital     Status: Abnormal   Collection Time: 09/11/17 11:45 AM  Result Value Ref Range   Yeast Wet Prep HPF POC NONE SEEN NONE SEEN   Trich, Wet Prep NONE SEEN NONE SEEN   Clue Cells Wet Prep HPF POC PRESENT (A) NONE SEEN   WBC, Wet Prep HPF POC FEW (A) NONE SEEN   Sperm NONE SEEN   hCG, quantitative, pregnancy     Status: Abnormal   Collection Time: 09/11/17 11:59 AM  Result Value Ref Range   hCG, Beta Chain, Quant, S 26 (H) <5 mIU/mL    1. Vaginal bleeding in pregnancy, first trimester       MDM  Spotting amt pink vag bleeding at this  time. Wet prep . Cultures obtained. VSS, results of pt encounter to Dr. Dareen PianoAnderson. POC discussed with Dr. Dareen PianoAnderson pt to follow up in office Wednesday. Will d/c home

## 2017-09-11 NOTE — Discharge Instructions (Signed)
Vaginal Bleeding During Pregnancy, First Trimester °A small amount of bleeding (spotting) from the vagina is common in early pregnancy. Sometimes the bleeding is normal and is not a problem, and sometimes it is a sign of something serious. Be sure to tell your doctor about any bleeding from your vagina right away. °Follow these instructions at home: °· Watch your condition for any changes. °· Follow your doctor's instructions about how active you can be. °· If you are on bed rest: °? You may need to stay in bed and only get up to use the bathroom. °? You may be allowed to do some activities. °? If you need help, make plans for someone to help you. °· Write down: °? The number of pads you use each day. °? How often you change pads. °? How soaked (saturated) your pads are. °· Do not use tampons. °· Do not douche. °· Do not have sex or orgasms until your doctor says it is okay. °· If you pass any tissue from your vagina, save the tissue so you can show it to your doctor. °· Only take medicines as told by your doctor. °· Do not take aspirin because it can make you bleed. °· Keep all follow-up visits as told by your doctor. °Contact a doctor if: °· You bleed from your vagina. °· You have cramps. °· You have labor pains. °· You have a fever that does not go away after you take medicine. °Get help right away if: °· You have very bad cramps in your back or belly (abdomen). °· You pass large clots or tissue from your vagina. °· You bleed more. °· You feel light-headed or weak. °· You pass out (faint). °· You have chills. °· You are leaking fluid or have a gush of fluid from your vagina. °· You pass out while pooping (having a bowel movement). °This information is not intended to replace advice given to you by your health care provider. Make sure you discuss any questions you have with your health care provider. °Document Released: 10/23/2013 Document Revised: 11/14/2015 Document Reviewed: 02/13/2013 °Elsevier Interactive  Patient Education © 2018 Elsevier Inc. ° °

## 2017-09-13 LAB — GC/CHLAMYDIA PROBE AMP (~~LOC~~) NOT AT ARMC
CHLAMYDIA, DNA PROBE: NEGATIVE
Neisseria Gonorrhea: NEGATIVE

## 2017-09-29 ENCOUNTER — Inpatient Hospital Stay (HOSPITAL_COMMUNITY)
Admission: AD | Admit: 2017-09-29 | Discharge: 2017-09-29 | Disposition: A | Discharge status: Home / Self Care | Payer: Medicaid Other | Source: Ambulatory Visit | Attending: Obstetrics and Gynecology | Admitting: Obstetrics and Gynecology

## 2017-09-29 ENCOUNTER — Inpatient Hospital Stay (HOSPITAL_COMMUNITY): Payer: Medicaid Other

## 2017-09-29 DIAGNOSIS — Z3491 Encounter for supervision of normal pregnancy, unspecified, first trimester: Secondary | ICD-10-CM

## 2017-09-29 DIAGNOSIS — O26891 Other specified pregnancy related conditions, first trimester: Secondary | ICD-10-CM | POA: Diagnosis not present

## 2017-09-29 DIAGNOSIS — Z3A01 Less than 8 weeks gestation of pregnancy: Secondary | ICD-10-CM | POA: Diagnosis not present

## 2017-09-29 LAB — HCG, QUANTITATIVE, PREGNANCY: hCG, Beta Chain, Quant, S: 8406 m[IU]/mL — ABNORMAL HIGH (ref <5)

## 2017-09-29 NOTE — MAU Provider Note (Signed)
Chief Complaint: Follow-up  SUBJECTIVE HPI: Makayla Fletcher is a 22 y.o. G2P0010 at [redacted]w[redacted]d by LMP who presents to maternity admissions reporting follow up labs. She reports she has been sent to the health department and clinic for [facility] lab work but was redirected to MAU. She reports HD told her she needed a second quant to show rise in order to start care. She has a hx of SAB in January with a quant of 47 then had a HCG on 09/11/17 that was 26 with a questionable new pregnancy by patient. She denies abdominal pain or vaginal bleeding. She denies vaginal itching/burning, urinary symptoms, h/a, dizziness, n/v, or fever/chills.     Past Medical History:  Diagnosis Date  . Depression   . Migraine without aura, without mention of intractable migraine without mention of status migrainosus   . Sickle cell anemia (HCC)   . Urinary tract infection July 2013   frequent    Past Surgical History:  Procedure Laterality Date  . NO PAST SURGERIES     Social History   Socioeconomic History  . Marital status: Single    Spouse name: Not on file  . Number of children: 0  . Years of education: college  . Highest education level: Not on file  Occupational History  . Occupation: MINOR    Employer: UNEMPLOYED  . Occupation: Consulting civil engineer    Comment: Engineer, water at [employer]  . Occupation: MENTOR/TEACHER    Employer: [employer]  Social Needs  . Financial resource strain: Not on file  . Food insecurity:    Worry: Not on file    Inability: Not on file  . Transportation needs:    Medical: Not on file    Non-medical: Not on file  Tobacco Use  . Smoking status: Never Smoker  . Smokeless tobacco: Never Used  Substance and Sexual Activity  . Alcohol use: No    Comment: Wine  . Drug use: No  . Sexual activity: Yes    Partners: Male    Comment: Depoprovera   Lifestyle  . Physical activity:    Days per week: Not on file    Minutes per session: Not on file  . Stress: Not on file  Relationships  .  Social connections:    Talks on phone: Not on file    Gets together: Not on file    Attends religious service: Not on file    Active member of club or organization: Not on file    Attends meetings of clubs or organizations: Not on file    Relationship status: Not on file  . Intimate partner violence:    Fear of current or ex partner: Not on file    Emotionally abused: Not on file    Physically abused: Not on file    Forced sexual activity: Not on file  Other Topics Concern  . Not on file  Social History Narrative  . Not on file   No current facility-administered medications on file prior to encounter.    Current Outpatient Medications on File Prior to Encounter  Medication Sig Dispense Refill  . acetaminophen (TYLENOL) 500 MG tablet Take 1,000 mg by mouth every 6 (six) hours as needed for mild pain.     . folic acid (FOLVITE) 1 MG tablet TAKE 1 TABLET (1 MG TOTAL) BY MOUTH DAILY. (Patient taking differently: Take 1 mg by mouth daily. ) 30 tablet 6  . Vitamin D, Ergocalciferol, (DRISDOL) 50000 units CAPS capsule TAKE 1 CAPSULE (50,000  UNITS TOTAL) BY MOUTH ONCE A WEEK. (Patient taking differently: Take 50,000 Units by mouth every 7 (seven) days. ) 12 capsule 3   No Known Allergies  ROS:  Review of Systems  Constitutional: Negative.   Respiratory: Negative.   Cardiovascular: Negative.   Gastrointestinal: Negative.   Genitourinary: Negative.    I have reviewed patient's Past Medical Hx, Surgical Hx, Family Hx, Social Hx, medications and allergies.   Physical Exam   Patient Vitals for the past 24 hrs:  BP Temp Temp src Pulse Resp SpO2 Height Weight  09/29/17 0950 107/75 98.9 F (37.2 C) Oral (!) 104 15 100 % 5\' 2"  (1.575 m) 120 lb (54.4 kg)   Constitutional: Well-developed, well-nourished female in no acute distress.  Cardiovascular: normal rate Respiratory: normal effort GI: Abd soft, non-tender. Pos BS x 4 Neurologic: Alert and oriented x 4.  PELVIC EXAM: deferred     LAB RESULTS Results for orders placed or performed during the hospital encounter of 09/29/17 (from the past 24 hour(s))  hCG, quantitative, pregnancy     Status: Abnormal   Collection Time: 09/29/17 10:13 AM  Result Value Ref Range   hCG, Beta Chain, Quant, S 8,406 (H) <5 mIU/mL   IMAGING US Ob Transvaginal  Result Date: 09/29/2017 CLINICAL DATA:  1st trimester pregnancy of unknown anatomic location. Spontaneous abortion 3 months ago. EXAM: TRANSVAGINAL OB ULTRASOUND TECHNIQUE: Transvaginal ultrasound was performed for complete evaluation of the gestation as well as the maternal uterus, adnexal regions, and pelvic cul-de-sac. COMPARISON:  09/11/2017 FINDINGS: Intrauterine gestational sac: Single Yolk sac:  Visualized. Embryo:  Visualized. Cardiac Activity: Visualized. Heart Rate: 97 bpm CRL:   2 mm   5 w 5 d                  [place] EDC: 05/27/2018 Subchorionic hemorrhage:  None visualized. Maternal uterus/adnexae: Normal appearance of both ovaries. No mass or abnormal free fluid identified. IMPRESSION: Single living IUP measuring 5 weeks 5 days, with [place] EDC of 05/27/2018. No significant maternal uterine or adnexal abnormality identified. Electronically Signed   By: Myles Rosenthal M.D.   On: 09/29/2017 12:13   US Ob Transvaginal  Result Date: 09/11/2017 CLINICAL DATA:  Vaginal bleeding in first trimester of pregnancy; post spontaneous abortion January 2019 EXAM: TRANSVAGINAL OB ULTRASOUND TECHNIQUE: Transvaginal ultrasound was performed for complete evaluation of the gestation as well as the maternal uterus, adnexal regions, and pelvic cul-de-sac. COMPARISON:  07/05/2017 FINDINGS: Intrauterine gestational sac: Absent Yolk sac:  N/A Embryo:  N/A Cardiac Activity: N/A Heart rate: N/A bpm MSD:   mm    w     d CRL:     mm    w  d                  [place] EDC: Subchorionic hemorrhage:  N/A Maternal uterus/adnexae: No intrauterine gestational sac or uterine mass. Question mild arcuate morphology of the uterus. No  endometrial fluid or blood. LEFT ovary measures 3.1 x 1.8 x 2.3 cm and contains a small corpus luteum. RIGHT ovary normal size and morphology, 3.8 x 2.1 x 2.2 cm. No adnexal masses or free pelvic fluid. IMPRESSION: No intrauterine gestation identified. Findings are compatible with pregnancy of unknown location. Differential diagnosis includes early intrauterine pregnancy too early to visualize, spontaneous abortion, and ectopic pregnancy. Serial quantitative beta hCG and or followup ultrasound recommended to definitively exclude ectopic pregnancy. Electronically Signed   By: Ulyses Southward M.D.   On: 09/11/2017 12:27  MAU Management/MDM: Orders Placed This Encounter  Procedures  . US OB Transvaginal  . hCG, quantitative, pregnancy   US- showed normal single living IUP at 2234w5d HCG showed significant rise in levels from 26 to 8,406  Pt discharged. Pt stable at time of discharge.   ASSESSMENT 1. Normal IUP (intrauterine pregnancy) on prenatal ultrasound, first trimester     PLAN Discharge home Make initial appointment for prenatal care Return to MAU as needed for emergencies included vaginal bleeding having to wear a pad and/or abdominal pain    Allergies as of 09/29/2017   No Known Allergies     Medication List    TAKE these medications   acetaminophen 500 MG tablet Commonly known as:  TYLENOL Take 1,000 mg by mouth every 6 (six) hours as needed for mild pain.   folic acid 1 MG tablet Commonly known as:  FOLVITE TAKE 1 TABLET (1 MG TOTAL) BY MOUTH DAILY. What changed:    how much to take  how to take this  when to take this  additional instructions   Vitamin D (Ergocalciferol) 50000 units Caps capsule Commonly known as:  DRISDOL TAKE 1 CAPSULE (50,000 UNITS TOTAL) BY MOUTH ONCE A WEEK. What changed:    how much to take  how to take this  when to take this  additional instructions       Steward DroneVeronica Falicity Sheets  Certified Nurse-Midwife 09/29/2017  12:09  PM

## 2017-09-29 NOTE — MAU Note (Signed)
Pt presents for ? Labs, has been to the health dept and to the clinic. Was told to come here. Denies bleeding or pain.

## 2017-10-06 ENCOUNTER — Inpatient Hospital Stay (HOSPITAL_COMMUNITY)
Admission: AD | Admit: 2017-10-06 | Discharge: 2017-10-06 | Disposition: A | Discharge status: Home / Self Care | Payer: Medicaid Other | Source: Ambulatory Visit | Attending: Obstetrics and Gynecology | Admitting: Obstetrics and Gynecology

## 2017-10-06 ENCOUNTER — Encounter (HOSPITAL_COMMUNITY): Payer: Self-pay

## 2017-10-06 ENCOUNTER — Inpatient Hospital Stay (HOSPITAL_COMMUNITY): Payer: Medicaid Other

## 2017-10-06 DIAGNOSIS — F329 Major depressive disorder, single episode, unspecified: Secondary | ICD-10-CM | POA: Insufficient documentation

## 2017-10-06 DIAGNOSIS — Z560 Unemployment, unspecified: Secondary | ICD-10-CM | POA: Insufficient documentation

## 2017-10-06 DIAGNOSIS — Z3A01 Less than 8 weeks gestation of pregnancy: Secondary | ICD-10-CM | POA: Insufficient documentation

## 2017-10-06 DIAGNOSIS — O209 Hemorrhage in early pregnancy, unspecified: Secondary | ICD-10-CM | POA: Diagnosis not present

## 2017-10-06 DIAGNOSIS — O99341 Other mental disorders complicating pregnancy, first trimester: Secondary | ICD-10-CM | POA: Insufficient documentation

## 2017-10-06 DIAGNOSIS — O09291 Supervision of pregnancy with other poor reproductive or obstetric history, first trimester: Secondary | ICD-10-CM | POA: Insufficient documentation

## 2017-10-06 NOTE — MAU Provider Note (Signed)
Chief Complaint: Vaginal Bleeding   First Provider Initiated Contact with Patient 10/06/17 2052        SUBJECTIVE HPI: Makayla Fletcher is a 22 y.o. G2P0010 at [redacted]w[redacted]d by LMP who presents to maternity admissions reporting bleeding tonight.  Saw one small clot.  No cramping.  She denies vaginal itching/burning, urinary symptoms, h/a, dizziness, n/v, or fever/chills.    Has gone to Health Dept for first visit. Was a pt of [facility] but states her insurance is not covered there.   Vaginal Bleeding  The patient's primary symptoms include vaginal bleeding. The patient's pertinent negatives include no genital itching, genital lesions, genital odor or pelvic pain. This is a new problem. The current episode started today. The problem occurs intermittently. The problem has been unchanged. The patient is experiencing no pain. She is pregnant. Pertinent negatives include no abdominal pain, back pain, constipation, diarrhea, fever, nausea or vomiting. The vaginal discharge was bloody. The vaginal bleeding is lighter than menses. She has been passing clots. She has not been passing tissue. Nothing aggravates the symptoms. She has tried nothing for the symptoms.   RN Note: Pt states tonight around 8pm she saw blood in the toilet after using the bathroom. This is the first time this has occurred. States she saw 1 small clot. Pt denies pain. Pt denies recent intercourse or vaginal exams    Past Medical History:  Diagnosis Date  . Depression   . Migraine without aura, without mention of intractable migraine without mention of status migrainosus   . Sickle cell anemia (HCC)   . Urinary tract infection July 2013   frequent    Past Surgical History:  Procedure Laterality Date  . NO PAST SURGERIES     Social History   Socioeconomic History  . Marital status: Single    Spouse name: Not on file  . Number of children: 0  . Years of education: college  . Highest education level: Not on file   Occupational History  . Occupation: MINOR    Employer: UNEMPLOYED  . Occupation: Consulting civil engineer    Comment: Engineer, water at [employer]  . Occupation: MENTOR/TEACHER    Employer: [employer]  Social Needs  . Financial resource strain: Not on file  . Food insecurity:    Worry: Not on file    Inability: Not on file  . Transportation needs:    Medical: Not on file    Non-medical: Not on file  Tobacco Use  . Smoking status: Never Smoker  . Smokeless tobacco: Never Used  Substance and Sexual Activity  . Alcohol use: No    Comment: Wine  . Drug use: No  . Sexual activity: Yes    Partners: Male    Comment: Depoprovera   Lifestyle  . Physical activity:    Days per week: Not on file    Minutes per session: Not on file  . Stress: Not on file  Relationships  . Social connections:    Talks on phone: Not on file    Gets together: Not on file    Attends religious service: Not on file    Active member of club or organization: Not on file    Attends meetings of clubs or organizations: Not on file    Relationship status: Not on file  . Intimate partner violence:    Fear of current or ex partner: Not on file    Emotionally abused: Not on file    Physically abused: Not on file  Forced sexual activity: Not on file  Other Topics Concern  . Not on file  Social History Narrative  . Not on file   No current facility-administered medications on file prior to encounter.    Current Outpatient Medications on File Prior to Encounter  Medication Sig Dispense Refill  . acetaminophen (TYLENOL) 500 MG tablet Take 1,000 mg by mouth every 6 (six) hours as needed for mild pain.     . folic acid (FOLVITE) 1 MG tablet TAKE 1 TABLET (1 MG TOTAL) BY MOUTH DAILY. (Patient taking differently: Take 1 mg by mouth daily. ) 30 tablet 6  . Vitamin D, Ergocalciferol, (DRISDOL) 50000 units CAPS capsule TAKE 1 CAPSULE (50,000 UNITS TOTAL) BY MOUTH ONCE A WEEK. (Patient taking differently: Take 50,000 Units by mouth  every 7 (seven) days. ) 12 capsule 3   No Known Allergies  I have reviewed patient's Past Medical Hx, Surgical Hx, Family Hx, Social Hx, medications and allergies.   ROS:  Review of Systems  Constitutional: Negative for fever.  Gastrointestinal: Negative for abdominal pain, constipation, diarrhea, nausea and vomiting.  Genitourinary: Positive for vaginal bleeding. Negative for pelvic pain.  Musculoskeletal: Negative for back pain.   Review of Systems  Other systems negative   Physical Exam  Physical Exam Patient Vitals for the past 24 hrs:  BP Temp Temp src Pulse Resp SpO2 Height Weight  10/06/17 2045 112/70 98.5 F (36.9 C) Oral 95 17 100 % 5\' 2"  (1.575 m) 121 lb (54.9 kg)   Constitutional: Well-developed, well-nourished female in no acute distress.  Cardiovascular: normal rate Respiratory: normal effort GI: Abd soft, non-tender. Pos BS x 4 MS: Extremities nontender, no edema, normal ROM Neurologic: Alert and oriented x 4.  GU: Neg CVAT.  PELVIC EXAM: Cervix pink, visually closed, without lesion, scant red discharge, vaginal walls and external genitalia normal   LAB RESULTS No results found for this or any previous visit (from the past 24 hour(s)).     IMAGING US Ob Transvaginal  Result Date: 10/06/2017 CLINICAL DATA:  Acute onset of vaginal bleeding. EXAM: TRANSVAGINAL OB ULTRASOUND TECHNIQUE: Transvaginal ultrasound was performed for complete evaluation of the gestation as well as the maternal uterus, adnexal regions, and pelvic cul-de-sac. COMPARISON:  Pelvic ultrasound performed 09/29/2017 FINDINGS: Intrauterine gestational sac: Single; visualized and normal in shape. Yolk sac:  Yes Embryo:  Yes Cardiac Activity: Yes Heart Rate: 130 bpm CRL: 7.5  mm   6 w 4 d                  [place] EDC: 05/28/2018 Subchorionic hemorrhage:  None visualized. Maternal uterus/adnexae: The uterus is otherwise unremarkable. The ovaries are within normal limits. The right ovary measures 3.4 x  2.1 x 1.4 cm and 1.5 x 2.1 cm. No suspicious adnexal masses are seen; there is no evidence for ovarian torsion. No free fluid is seen within the pelvic cul-de-sac. IMPRESSION: Single live intrauterine pregnancy, with a crown-rump length of 8 mm, corresponding to a gestational age of [redacted] weeks 4 days. This matches the gestational age of [redacted] weeks 5 days by the first ultrasound, reflecting an estimated date of delivery of May 27, 2018. Electronically Signed   By: Roanna Raider M.D.   On: 10/06/2017 22:10   US Ob Transvaginal  Result Date: 09/29/2017 CLINICAL DATA:  1st trimester pregnancy of unknown anatomic location. Spontaneous abortion 3 months ago. EXAM: TRANSVAGINAL OB ULTRASOUND TECHNIQUE: Transvaginal ultrasound was performed for complete evaluation of the gestation as well as  the maternal uterus, adnexal regions, and pelvic cul-de-sac. COMPARISON:  09/11/2017 FINDINGS: Intrauterine gestational sac: Single Yolk sac:  Visualized. Embryo:  Visualized. Cardiac Activity: Visualized. Heart Rate: 97 bpm CRL:   2 mm   5 w 5 d                  US EDC: 05/27/2018 Subchorionic hemorrhage:  None visualized. Maternal uterus/adnexae: Normal appearance of both ovaries. No mass or abnormal free fluid identified. IMPRESSION: Single living IUP measuring 5 weeks 5 days, with US EDC of 05/27/2018. No significant maternal uterine or adnexal abnormality identified. Electronically Signed   By: Myles RosenthalJohn  Stahl M.D.   On: 09/29/2017 12:13   [place] Ob Transvaginal  Result Date: 09/11/2017 CLINICAL DATA:  Vaginal bleeding in first trimester of pregnancy; post spontaneous abortion January 2019 EXAM: TRANSVAGINAL OB ULTRASOUND TECHNIQUE: Transvaginal ultrasound was performed for complete evaluation of the gestation as well as the maternal uterus, adnexal regions, and pelvic cul-de-sac. COMPARISON:  07/05/2017 FINDINGS: Intrauterine gestational sac: Absent Yolk sac:  N/A Embryo:  N/A Cardiac Activity: N/A Heart rate: N/A bpm MSD:   mm     w     d CRL:     mm    w  d                  US EDC: Subchorionic hemorrhage:  N/A Maternal uterus/adnexae: No intrauterine gestational sac or uterine mass. Question mild arcuate morphology of the uterus. No endometrial fluid or blood. LEFT ovary measures 3.1 x 1.8 x 2.3 cm and contains a small corpus luteum. RIGHT ovary normal size and morphology, 3.8 x 2.1 x 2.2 cm. No adnexal masses or free pelvic fluid. IMPRESSION: No intrauterine gestation identified. Findings are compatible with pregnancy of unknown location. Differential diagnosis includes early intrauterine pregnancy too early to visualize, spontaneous abortion, and ectopic pregnancy. Serial quantitative beta hCG and or followup ultrasound recommended to definitively exclude ectopic pregnancy. Electronically Signed   By: Ulyses SouthwardMark  Boles M.D.   On: 09/11/2017 12:27     MAU Management/MDM: Ordered Ultrasound to rule out miscarriage or subchorionic bleed.  Unable to clearly see pregnancy on bedside US.   Treatments in MAU included bedside US.  >>   Normal single IUP live seen. No subchorionic hemorrhage  This bleeding/pain can represent a normal pregnancy with bleeding, spontaneous abortion or even an ectopic which can be life-threatening.  The process as listed above helps to determine which of these is present.  Blood type O+  ASSESSMENT Single IUP at 2911w5d Vaginal bleeding in first trimester Rh +  PLAN Discharge home Pelvic rest List of OB providers given  Pt stable at time of discharge. Encouraged to return here or to other Urgent Care/ED if she develops worsening of symptoms, increase in pain, fever, or other concerning symptoms.    Wynelle BourgeoisMarie Littie Chiem CNM, MSN Certified Nurse-Midwife 10/06/2017  8:52 PM

## 2017-10-06 NOTE — Discharge Instructions (Signed)
Vaginal Bleeding During Pregnancy, First Trimester A small amount of bleeding (spotting) from the vagina is common in early pregnancy. Sometimes the bleeding is normal and is not a problem, and sometimes it is a sign of something serious. Be sure to tell your doctor about any bleeding from your vagina right away. Follow these instructions at home:  Watch your condition for any changes.  Follow your doctor's instructions about how active you can be.  If you are on bed rest: ? You may need to stay in bed and only get up to use the bathroom. ? You may be allowed to do some activities. ? If you need help, make plans for someone to help you.  Write down: ? The number of pads you use each day. ? How often you change pads. ? How soaked (saturated) your pads are.  Do not use tampons.  Do not douche.  Do not have sex or orgasms until your doctor says it is okay.  If you pass any tissue from your vagina, save the tissue so you can show it to your doctor.  Only take medicines as told by your doctor.  Do not take aspirin because it can make you bleed.  Keep all follow-up visits as told by your doctor. Contact a doctor if:  You bleed from your vagina.  You have cramps.  You have labor pains.  You have a fever that does not go away after you take medicine. Get help right away if:  You have very bad cramps in your back or belly (abdomen).  You pass large clots or tissue from your vagina.  You bleed more.  You feel light-headed or weak.  You pass out (faint).  You have chills.  You are leaking fluid or have a gush of fluid from your vagina.  You pass out while pooping (having a bowel movement). This information is not intended to replace advice given to you by your health care provider. Make sure you discuss any questions you have with your health care provider. Document Released: 10/23/2013 Document Revised: 11/14/2015 Document Reviewed: 02/13/2013 Elsevier Interactive  Patient Education  2018 Elsevier Inc.  [place] [facility] Providers    [facility]       Phone: [phone]  [facility] [place] Phone: [phone]  [facility] [place]  Phone: [phone]  [facility]  Phone: [phone]  [facility] [place]  Phone: [phone]  [facility]       Phone: [phone]  [facility]    Phone: [phone]   [facility]    Phone: [phone]  [facility]    Phone: [phone]  [facility]    Phone: [phone]  [facility]    Phone: [phone]  [facility]       Phone: [phone]   [facility]  Phone: [phone]  [facility]    Phone: [phone]  [facility]   Phone: [phone]  [facility]      Phone: [phone]  [facility]    Phone: [phone]

## 2017-10-06 NOTE — MAU Note (Signed)
Pt states tonight around 8pm she saw blood in the toilet after using the bathroom. This is the first time this has occurred. States she saw 1 small clot. Pt denies pain. Pt denies recent intercourse or vaginal exams.

## 2017-10-08 ENCOUNTER — Inpatient Hospital Stay (HOSPITAL_COMMUNITY)
Admission: AD | Admit: 2017-10-08 | Discharge: 2017-10-08 | Disposition: A | Discharge status: Home / Self Care | Payer: Medicaid Other | Source: Ambulatory Visit | Attending: Family Medicine | Admitting: Family Medicine

## 2017-10-08 ENCOUNTER — Encounter (HOSPITAL_COMMUNITY): Payer: Self-pay | Admitting: *Deleted

## 2017-10-08 ENCOUNTER — Inpatient Hospital Stay (HOSPITAL_COMMUNITY): Payer: Medicaid Other

## 2017-10-08 DIAGNOSIS — Z3A01 Less than 8 weeks gestation of pregnancy: Secondary | ICD-10-CM | POA: Insufficient documentation

## 2017-10-08 DIAGNOSIS — O209 Hemorrhage in early pregnancy, unspecified: Secondary | ICD-10-CM | POA: Insufficient documentation

## 2017-10-08 DIAGNOSIS — Z3491 Encounter for supervision of normal pregnancy, unspecified, first trimester: Secondary | ICD-10-CM

## 2017-10-08 LAB — CBC
HEMATOCRIT: 28 % — AB (ref 36.0–46.0)
HEMOGLOBIN: 10.5 g/dL — AB (ref 12.0–15.0)
MCH: 30.3 pg (ref 26.0–34.0)
MCHC: 37.5 g/dL — AB (ref 30.0–36.0)
MCV: 80.7 fL (ref 78.0–100.0)
Platelets: 112 10*3/uL — ABNORMAL LOW (ref 150–400)
RBC: 3.47 MIL/uL — ABNORMAL LOW (ref 3.87–5.11)
RDW: 14.8 % (ref 11.5–15.5)
WBC: 8.7 10*3/uL (ref 4.0–10.5)

## 2017-10-08 LAB — URINALYSIS, ROUTINE W REFLEX MICROSCOPIC
Bilirubin Urine: NEGATIVE
GLUCOSE, UA: NEGATIVE mg/dL
KETONES UR: NEGATIVE mg/dL
LEUKOCYTES UA: NEGATIVE
Nitrite: NEGATIVE
PH: 7 (ref 5.0–8.0)
Protein, ur: 30 mg/dL — AB
Specific Gravity, Urine: 1.013 (ref 1.005–1.030)

## 2017-10-08 NOTE — MAU Provider Note (Addendum)
History     CSN: [id number]  Arrival date and time: 10/08/17 1625   First Provider Initiated Contact with Patient 10/08/17 1931      Chief Complaint  Patient presents with  . Vaginal Bleeding   HPI  Ms.  Makayla Fletcher is a 22 y.o. year old G102P0010 female at [redacted]w[redacted]d weeks gestation who presents to MAU reporting heavier VB today; not saturating a pad. She was seen here on Wednesday 4/17. Viable IUP with (+) cardiac activity and no SCH seen with informal bedside US. She denies any abdominal pain.   Past Medical History:  Diagnosis Date  . Depression   . Migraine without aura, without mention of intractable migraine without mention of status migrainosus   . Sickle cell anemia (HCC)   . Urinary tract infection July 2013   frequent     Past Surgical History:  Procedure Laterality Date  . NO PAST SURGERIES      Family History  Problem Relation Age of Onset  . Migraines Father     Social History   Tobacco Use  . Smoking status: Never Smoker  . Smokeless tobacco: Never Used  Substance Use Topics  . Alcohol use: No    Comment: Wine  . Drug use: No    Allergies: No Known Allergies  Medications Prior to Admission  Medication Sig Dispense Refill Last Dose  . folic acid (FOLVITE) 1 MG tablet TAKE 1 TABLET (1 MG TOTAL) BY MOUTH DAILY. (Patient taking differently: Take 1 mg by mouth daily. ) 30 tablet 6 10/07/2017 at Unknown time  . Prenatal Vit-Fe Fumarate-FA (PRENATAL MULTIVITAMIN) TABS tablet Take 1 tablet by mouth at bedtime.    10/07/2017 at Unknown time  . Vitamin D, Ergocalciferol, (DRISDOL) 50000 units CAPS capsule TAKE 1 CAPSULE (50,000 UNITS TOTAL) BY MOUTH ONCE A WEEK. (Patient taking differently: Take 50,000 Units by mouth every 7 (seven) days. On Saturdays) 12 capsule 3 10/02/2017    Review of Systems  Constitutional: Negative.   HENT: Negative.   Eyes: Negative.   Respiratory: Negative.   Cardiovascular: Negative.   Gastrointestinal: Negative.   Endocrine:  Negative.   Genitourinary: Positive for vaginal bleeding.  Musculoskeletal: Negative.   Skin: Negative.   Allergic/Immunologic: Negative.   Neurological: Negative.   Hematological: Negative.   Psychiatric/Behavioral: Negative.    Physical Exam   Pulse 98, temperature 98.7 F (37.1 C), temperature source Oral, resp. rate 16, weight 121 lb (54.9 kg), last menstrual period 08/08/2017, SpO2 100 %.  Physical Exam  Constitutional: She is oriented to person, place, and time. She appears well-developed and well-nourished.  HENT:  Head: Normocephalic and atraumatic.  Eyes: Pupils are equal, round, and reactive to light.  Neck: Normal range of motion.  Cardiovascular: Normal rate, regular rhythm and normal heart sounds.  Respiratory: Effort normal and breath sounds normal.  GI: Soft. Bowel sounds are normal.  Genitourinary:  Genitourinary Comments: Uterus: enlarged, S=D, SE: cervix is smooth, pink, no lesions, moderate amt of dark, red blood , closed/long/firm, no CMT or friability, no adnexal tenderness   Musculoskeletal: Normal range of motion.  Neurological: She is alert and oriented to person, place, and time.  Skin: Skin is warm and dry.  Psychiatric: She has a normal mood and affect. Her behavior is normal. Judgment and thought content normal.    MAU Course  Procedures  MDM CCUA  Results for orders placed or performed during the hospital encounter of 10/08/17 (from the past 24 hour(s))  Urinalysis, Routine w  reflex microscopic     Status: Abnormal   Collection Time: 10/08/17  5:35 PM  Result Value Ref Range   Color, Urine AMBER (A) YELLOW   APPearance HAZY (A) CLEAR   Specific Gravity, Urine 1.013 1.005 - 1.030   pH 7.0 5.0 - 8.0   Glucose, UA NEGATIVE NEGATIVE mg/dL   Hgb urine dipstick LARGE (A) NEGATIVE   Bilirubin Urine NEGATIVE NEGATIVE   Ketones, ur NEGATIVE NEGATIVE mg/dL   Protein, ur 30 (A) NEGATIVE mg/dL   Nitrite NEGATIVE NEGATIVE   Leukocytes, UA NEGATIVE  NEGATIVE   RBC / HPF TOO NUMEROUS TO COUNT 0 - 5 RBC/hpf   WBC, UA 6-30 0 - 5 WBC/hpf   Bacteria, UA RARE (A) NONE SEEN   Squamous Epithelial / LPF 0-5 (A) NONE SEEN  CBC     Status: Abnormal   Collection Time: 10/08/17  7:16 PM  Result Value Ref Range   WBC 8.7 4.0 - 10.5 K/uL   RBC 3.47 (L) 3.87 - 5.11 MIL/uL   Hemoglobin 10.5 (L) 12.0 - 15.0 g/dL   HCT 16.128.0 (L) 09.636.0 - 04.546.0 %   MCV 80.7 78.0 - 100.0 fL   MCH 30.3 26.0 - 34.0 pg   MCHC 37.5 (H) 30.0 - 36.0 g/dL   RDW 40.914.8 81.111.5 - 91.415.5 %   Platelets 112 (L) 150 - 400 K/uL   Report given to and care assumed by Steward DroneVeronica Eluterio Seymour, CNM @ [address]  Rolitta Dawson, MSN, CNM 10/08/2017, 7:41 PM   US on 10/08/2017: [place] Ob Transvaginal  Result Date: 10/08/2017 CLINICAL DATA:  Pregnant patient with spotting. EXAM: TRANSVAGINAL OB ULTRASOUND TECHNIQUE: Transvaginal ultrasound was performed for complete evaluation of the gestation as well as the maternal uterus, adnexal regions, and pelvic cul-de-sac. COMPARISON:  Ob ultrasound 10/06/2017. FINDINGS: Intrauterine gestational sac: Visualized. Yolk sac:  Visualized. Embryo:  Visualized. Cardiac Activity: Detected. Heart Rate: 148 bpm CRL: 9.4  mm   6 w 6 d                  US EDC: 05/28/2018. Subchorionic hemorrhage:  None visualized. Maternal uterus/adnexae: Small corpus luteum cyst on the left noted. IMPRESSION: Single living intrauterine pregnancy.  No acute finding. Electronically Signed   By: Drusilla Kannerhomas  Dalessio M.D.   On: 10/08/2017 20:34   Discussed with patient normal results of US, educated on pelvic rest and no intercourse or stimulation while bleeding continues. Patient verbalizes understanding. Encouraged to start prenatal care as scheduled- [facility] Education on reasons to return to MAU with increased vaginal bleeding, having to wear a pad, and/or abdominal pain.   Assessment and Plan   1. Normal IUP (intrauterine pregnancy) on prenatal ultrasound, first  trimester   2. Bleeding in early pregnancy    Pt discharged. Pt stable at the time of discharge  Follow up as scheduled with the HD  Return to MAU as needed  Continue taking prenatal vitamins Pelvic rest and no intercourse or stimulation  Allergies as of 10/08/2017   No Known Allergies     Medication List    TAKE these medications   folic acid 1 MG tablet Commonly known as:  FOLVITE TAKE 1 TABLET (1 MG TOTAL) BY MOUTH DAILY. What changed:    how much to take  how to take this  when to take this  additional instructions   prenatal multivitamin Tabs tablet Take 1 tablet by mouth at bedtime.   Vitamin D (Ergocalciferol) 50000  units Caps capsule Commonly known as:  DRISDOL TAKE 1 CAPSULE (50,000 UNITS TOTAL) BY MOUTH ONCE A WEEK. What changed:    how much to take  how to take this  when to take this  additional instructions      Sharyon Cable, CNM 10/08/17, 11:42 PM

## 2017-10-08 NOTE — MAU Note (Signed)
Pt was here Wednesday with some spotting, has increased today but not filling a pad. No pain.

## 2017-10-11 ENCOUNTER — Inpatient Hospital Stay (HOSPITAL_COMMUNITY)
Admission: AD | Admit: 2017-10-11 | Discharge: 2017-10-11 | Disposition: A | Discharge status: Home / Self Care | Payer: Medicaid Other | Source: Ambulatory Visit | Attending: Obstetrics and Gynecology | Admitting: Obstetrics and Gynecology

## 2017-10-11 ENCOUNTER — Inpatient Hospital Stay (HOSPITAL_COMMUNITY): Payer: Medicaid Other

## 2017-10-11 ENCOUNTER — Encounter (HOSPITAL_COMMUNITY): Payer: Self-pay | Admitting: *Deleted

## 2017-10-11 DIAGNOSIS — Z3A01 Less than 8 weeks gestation of pregnancy: Secondary | ICD-10-CM

## 2017-10-11 DIAGNOSIS — O034 Incomplete spontaneous abortion without complication: Secondary | ICD-10-CM | POA: Diagnosis not present

## 2017-10-11 DIAGNOSIS — N939 Abnormal uterine and vaginal bleeding, unspecified: Secondary | ICD-10-CM

## 2017-10-11 LAB — CBC
HCT: 27.3 % — ABNORMAL LOW (ref 36.0–46.0)
HEMOGLOBIN: 10.1 g/dL — AB (ref 12.0–15.0)
MCH: 29.7 pg (ref 26.0–34.0)
MCHC: 37 g/dL — AB (ref 30.0–36.0)
MCV: 80.3 fL (ref 78.0–100.0)
Platelets: 120 10*3/uL — ABNORMAL LOW (ref 150–400)
RBC: 3.4 MIL/uL — ABNORMAL LOW (ref 3.87–5.11)
RDW: 15 % (ref 11.5–15.5)
WBC: 10.1 10*3/uL (ref 4.0–10.5)

## 2017-10-11 LAB — URINALYSIS, ROUTINE W REFLEX MICROSCOPIC
Bilirubin Urine: NEGATIVE
Glucose, UA: NEGATIVE mg/dL
Ketones, ur: NEGATIVE mg/dL
Leukocytes, UA: NEGATIVE
Nitrite: NEGATIVE
Protein, ur: NEGATIVE mg/dL
SPECIFIC GRAVITY, URINE: 1.012 (ref 1.005–1.030)
pH: 6 (ref 5.0–8.0)

## 2017-10-11 MED ORDER — MISOPROSTOL 200 MCG PO TABS: 800.0000 ug | ORAL_TABLET | Freq: Once | ORAL | 0 refills | Status: DC

## 2017-10-11 MED ORDER — IBUPROFEN 600 MG PO TABS: 600.0000 mg | ORAL_TABLET | Freq: Four times a day (QID) | ORAL | 0 refills | Status: DC | PRN

## 2017-10-11 MED ORDER — OXYCODONE-ACETAMINOPHEN 5-325 MG PO TABS: 1.0000 | ORAL_TABLET | ORAL | 0 refills | Status: DC | PRN

## 2017-10-11 MED ORDER — PROMETHAZINE HCL 12.5 MG PO TABS: 12.5000 mg | ORAL_TABLET | Freq: Four times a day (QID) | ORAL | 0 refills | Status: DC | PRN

## 2017-10-11 NOTE — MAU Provider Note (Signed)
History     CSN: [id number]  Arrival date and time: 10/11/17 1751   First Provider Initiated Contact with Patient 10/11/17 1834      Chief Complaint  Patient presents with  . Vaginal Bleeding   HPI   [name] is a 22 y.o. female G2P0010 @ 4442w3d here in MAU with complaints of worsening vaginal bleeding. Says she has been back and fourth to MAU due to vaginal bleeding. Today the bleeding progressed to heavy vaginal bleeding with clots. Says it is much heavier than it has been the last few times she has come. Some mild lower abdominal cramping that comes and goes. She has not taken any medication for the pain.   OB History    Gravida  2   Para      Term      Preterm      AB  1   Living        SAB  1   TAB      Ectopic      Multiple      Live Births              Past Medical History:  Diagnosis Date  . Depression   . Migraine without aura, without mention of intractable migraine without mention of status migrainosus   . Sickle cell anemia (HCC)   . Urinary tract infection July 2013   frequent     Past Surgical History:  Procedure Laterality Date  . NO PAST SURGERIES      Family History  Problem Relation Age of Onset  . Migraines Father     Social History   Tobacco Use  . Smoking status: Never Smoker  . Smokeless tobacco: Never Used  Substance Use Topics  . Alcohol use: No    Comment: Wine  . Drug use: No    Allergies: No Known Allergies  Medications Prior to Admission  Medication Sig Dispense Refill Last Dose  . folic acid (FOLVITE) 1 MG tablet TAKE 1 TABLET (1 MG TOTAL) BY MOUTH DAILY. (Patient taking differently: Take 1 mg by mouth daily. ) 30 tablet 6 10/07/2017 at Unknown time  . Prenatal Vit-Fe Fumarate-FA (PRENATAL MULTIVITAMIN) TABS tablet Take 1 tablet by mouth at bedtime.    10/07/2017 at Unknown time  . Vitamin D, Ergocalciferol, (DRISDOL) 50000 units CAPS capsule TAKE 1 CAPSULE (50,000 UNITS TOTAL) BY MOUTH ONCE A  WEEK. (Patient taking differently: Take 50,000 Units by mouth every 7 (seven) days. On Saturdays) 12 capsule 3 10/02/2017   Results for orders placed or performed during the hospital encounter of 10/11/17 (from the past 48 hour(s))  Urinalysis, Routine w reflex microscopic     Status: Abnormal   Collection Time: 10/11/17  6:30 PM  Result Value Ref Range   Color, Urine YELLOW YELLOW   APPearance CLEAR CLEAR   Specific Gravity, Urine 1.012 1.005 - 1.030   pH 6.0 5.0 - 8.0   Glucose, UA NEGATIVE NEGATIVE mg/dL   Hgb urine dipstick LARGE (A) NEGATIVE   Bilirubin Urine NEGATIVE NEGATIVE   Ketones, ur NEGATIVE NEGATIVE mg/dL   Protein, ur NEGATIVE NEGATIVE mg/dL   Nitrite NEGATIVE NEGATIVE   Leukocytes, UA NEGATIVE NEGATIVE   RBC / HPF 0-5 0 - 5 RBC/hpf   WBC, UA 0-5 0 - 5 WBC/hpf   Bacteria, UA RARE (A) NONE SEEN   Squamous Epithelial / LPF 0-5 (A) NONE SEEN   Mucus PRESENT     Comment: Performed at  [facility], [address], [place], [place] [address]   US Ob Transvaginal  Result Date: 10/11/2017 CLINICAL DATA:  Vaginal bleeding today. EXAM: TRANSVAGINAL OB ULTRASOUND TECHNIQUE: Transvaginal ultrasound was performed for complete evaluation of the gestation as well as the maternal uterus, adnexal regions, and pelvic cul-de-sac. COMPARISON:  10/08/2017 FINDINGS: Intrauterine gestational sac: Single Yolk sac:  Not seen Embryo:  Present Cardiac Activity: Not present CRL: 12.7 mm   7 w 3 d Subchorionic hemorrhage:  Small Maternal uterus/adnexae: Normal adnexa. No free fluid. IMPRESSION: Single intrauterine pregnancy corresponding to 7 weeks and 3 days gestation with no fetal cardiac activity, consistent with fetal demise. These results were called by telephone at the time of interpretation on 10/11/2017 at 7:48 pm to Dr. Victorino Dike Detar [place] , who verbally acknowledged these results. Electronically Signed   By: Ted Mcalpine M.D.   On: 10/11/2017 19:49   Review of Systems  Constitutional:  Negative for fever.  Gastrointestinal: Positive for abdominal pain.  Genitourinary: Positive for vaginal bleeding.   Physical Exam   Blood pressure 112/70, pulse 96, temperature 98.3 F (36.8 C), temperature source Oral, resp. rate 18, weight 121 lb (54.9 kg), last menstrual period 08/08/2017, SpO2 100 %.  Physical Exam  Constitutional: She is oriented to person, place, and time. She appears well-developed and well-nourished. No distress.  HENT:  Head: Normocephalic.  Eyes: Pupils are equal, round, and reactive to light.  Neck: Neck supple.  GI: Soft. She exhibits no distension. There is no tenderness. There is no rebound.  Genitourinary:  Genitourinary Comments: Bimanual exam: Cervix slightly open, posterior. Small amount of dark red blood noted in the perineum.  Uterus non tender, enlarged  Chaperone present for exam.   Musculoskeletal: Normal range of motion.  Neurological: She is alert and oriented to person, place, and time.  Skin: Skin is warm. She is not diaphoretic.  Psychiatric: Her behavior is normal.    MAU Course  Procedures    MDM  O positive blood type. Discussed [place] in detail today with the patient. Discussed options including watchful waiting and cytotec. Patient opted for cytotec for outpatient management.  Cbc stable          Early Intrauterine Pregnancy Failure  __X_  Documented intrauterine pregnancy failure less than or equal to [redacted] weeks gestation  _X__  No serious current illness  _X__  Baseline Hgb greater than or equal to 10g/dl  _X__  Patient has easily accessible transportation to the hospital  _X__  Clear preference  __X_  Practitioner/physician deems patient reliable  _X__  Counseling by practitioner or physician  _X__  Patient education by RN  _NA__  Consent form signed  NA___  Rho-Gam given by RN if indicated  ___ Medication dispensed   __X_   Cytotec 800 mcg  __   Intravaginally by patient at home         __    Intravaginally by RN in MAU        _X_   buccal by patient at home        __   Rectally by RN in MAU  _X__  Ibuprofen 600 mg 1 tablet by mouth every 6 hours as needed #30  _X__  Hydrocodone/acetaminophen 5/325 mg by mouth every 4 to 6 hours as needed  __X_  Phenergan 12.5 mg by mouth every 4 hours as needed for nausea   Assessment and Plan   A:  1. Incomplete miscarriage   2. [redacted] weeks gestation of  pregnancy   3. Episode of heavy vaginal bleeding     P:  Discharge home with strict return precautions Rx: Cytotec Return to MAU if symptoms worsen Bleeding precautions Follow up in the [facility] in 1 week for labs and 2 weeks with a provider. The clinic will call you to schedule that appointment.    Duane Lope, NP 10/12/2017 8:35 AM

## 2017-10-11 NOTE — Discharge Instructions (Signed)

## 2017-10-11 NOTE — MAU Note (Signed)
Here 3 days ago for bleeding and told to come back if having clots or pain.  Now passing nickel sized clots and rating intermittent cramping pain a 8/10.

## 2017-10-12 ENCOUNTER — Telehealth: Payer: Self-pay | Admitting: General Practice

## 2017-10-12 NOTE — Telephone Encounter (Signed)
Called and left message on VM for patient to give our office a call in regards of appointments scheduled for 10/19/17 (Labs) and 10/29/17 (SAB f/u).

## 2017-10-19 ENCOUNTER — Ambulatory Visit: Payer: Medicaid Other

## 2017-10-29 ENCOUNTER — Ambulatory Visit (INDEPENDENT_AMBULATORY_CARE_PROVIDER_SITE_OTHER): Payer: Medicaid Other | Admitting: Certified Nurse Midwife

## 2017-10-29 ENCOUNTER — Encounter: Payer: Self-pay | Admitting: Certified Nurse Midwife

## 2017-10-29 VITALS — BP 108/75 | HR 83 | Ht 62.0 in | Wt >= 6400 oz

## 2017-10-29 DIAGNOSIS — O021 Missed abortion: Secondary | ICD-10-CM

## 2017-10-29 NOTE — Progress Notes (Signed)
GYNECOLOGY CARE ENCOUNTER NOTE  Subjective:   Makayla Fletcher is a 22 y.o. G80P0010 female here for 2 week incomplete miscarriage follow up. She was seen in MAU on 10/11/2017 where it was noted at [redacted]w[redacted]d gestation pregnancy to not have cardiac activity. She reports giving Cytotec buccal for incomplete miscarriage- she reports heavy bleeding occurred 2 hours after taking medication and continued for 5-6 days. She reports spotting when she wipes currently but not having to wear a pad. She denies abdominal pain or cramping. She denies lightheadedness, dizziness, h/a, or N/V.     Gynecologic History Patient's last menstrual period was 08/08/2017.  Obstetric History OB History  Gravida Para Term Preterm AB Living  2       2    SAB TAB Ectopic Multiple Live Births  2            # Outcome Date GA Lbr Len/2nd Weight Sex Delivery Anes PTL Lv  2 SAB 10/11/17 [redacted]w[redacted]d         1 SAB 07/02/17            Past Medical History:  Diagnosis Date  . Depression   . Migraine without aura, without mention of intractable migraine without mention of status migrainosus   . Sickle cell anemia (HCC)   . Urinary tract infection July 2013   frequent     Past Surgical History:  Procedure Laterality Date  . NO PAST SURGERIES      Current Outpatient Medications on File Prior to Visit  Medication Sig Dispense Refill  . folic acid (FOLVITE) 1 MG tablet TAKE 1 TABLET (1 MG TOTAL) BY MOUTH DAILY. (Patient taking differently: Take 1 mg by mouth daily. ) 30 tablet 6  . ibuprofen (ADVIL,MOTRIN) 600 MG tablet Take 1 tablet (600 mg total) by mouth every 6 (six) hours as needed. 30 tablet 0  . oxyCODONE-acetaminophen (PERCOCET/ROXICET) 5-325 MG tablet Take 1 tablet by mouth every 4 (four) hours as needed for severe pain. 10 tablet 0  . Vitamin D, Ergocalciferol, (DRISDOL) 50000 units CAPS capsule TAKE 1 CAPSULE (50,000 UNITS TOTAL) BY MOUTH ONCE A WEEK. (Patient taking differently: Take 50,000 Units by mouth  every 7 (seven) days. On Saturdays) 12 capsule 3  . misoprostol (CYTOTEC) 200 MCG tablet Take 4 tablets (800 mcg total) by mouth once for 1 dose. 4 tablet 0   No current facility-administered medications on file prior to visit.     No Known Allergies  Social History   Socioeconomic History  . Marital status: Single    Spouse name: Not on file  . Number of children: 0  . Years of education: college  . Highest education level: Not on file  Occupational History  . Occupation: MINOR    Employer: UNEMPLOYED  . Occupation: Consulting civil engineer    Comment: Engineer, water at [employer]  . Occupation: MENTOR/TEACHER    Employer: [employer]  Social Needs  . Financial resource strain: Not on file  . Food insecurity:    Worry: Not on file    Inability: Not on file  . Transportation needs:    Medical: Not on file    Non-medical: Not on file  Tobacco Use  . Smoking status: Never Smoker  . Smokeless tobacco: Never Used  Substance and Sexual Activity  . Alcohol use: No    Comment: Wine  . Drug use: No  . Sexual activity: Yes    Partners: Male    Comment: Depoprovera   Lifestyle  .  Physical activity:    Days per week: Not on file    Minutes per session: Not on file  . Stress: Not on file  Relationships  . Social connections:    Talks on phone: Not on file    Gets together: Not on file    Attends religious service: Not on file    Active member of club or organization: Not on file    Attends meetings of clubs or organizations: Not on file    Relationship status: Not on file  . Intimate partner violence:    Fear of current or ex partner: Not on file    Emotionally abused: Not on file    Physically abused: Not on file    Forced sexual activity: Not on file  Other Topics Concern  . Not on file  Social History Narrative  . Not on file    Family History  Problem Relation Age of Onset  . Migraines Father     The following portions of the patient's history were reviewed and updated as  appropriate: allergies, current medications, past family history, past medical history, past social history, past surgical history and problem list.  Review of Systems Pertinent items noted in HPI and remainder of comprehensive ROS otherwise negative.   Objective:  BP 108/75   Pulse 83   Ht  (1.575 m)   Wt (!) 1225 lb 8 oz (555.9 kg)   LMP 08/08/2017   Breastfeeding? Unknown   BMI 224.15 kg/m  CONSTITUTIONAL: Well-developed, well-nourished female in no acute distress.  HENT:  Normocephalic, atraumatic, External right and left ear normal. Oropharynx is clear and moist EYES: Conjunctivae and EOM are normal. Pupils are equal, round, and reactive to light.  SKIN: Skin is warm and dry. No rash noted. Not diaphoretic. No erythema. No pallor. NEUROLOGIC: Alert and oriented to person, place, and time. Normal reflexes, muscle tone coordination. No cranial nerve deficit noted. PSYCHIATRIC: Normal mood and affect. Normal behavior. Normal judgment and thought content. CARDIOVASCULAR: Normal heart rate noted, regular rhythm RESPIRATORY: Clear to auscultation bilaterally. Effort and breath sounds normal, no problems with respiration noted. ABDOMEN: Soft, normal bowel sounds, no distention noted.  No tenderness, rebound or guarding.  PELVIC: deferred   Assessment and Plan:  1. Incomplete abortion S/p Cytotec. Patient missed lab appointment for quant. Agrees to drawing labs today but unable to stay for results today because she needs to get back to work.  - Beta hCG quant (ref lab)   Will call patient with results of HCG level  Follow up as scheduled for annual exam  Will discussed POC with patient if additional appointments are needed once results of HCG return   Sharyon Cable, CNM 10/29/17, 10:39 AM

## 2017-10-30 ENCOUNTER — Encounter (HOSPITAL_COMMUNITY): Payer: Self-pay | Admitting: *Deleted

## 2017-10-30 ENCOUNTER — Inpatient Hospital Stay (HOSPITAL_COMMUNITY)
Admission: AD | Admit: 2017-10-30 | Discharge: 2017-10-31 | Disposition: A | Discharge status: Home / Self Care | Payer: Medicaid Other | Source: Ambulatory Visit | Attending: Family Medicine | Admitting: Family Medicine

## 2017-10-30 ENCOUNTER — Inpatient Hospital Stay (HOSPITAL_COMMUNITY): Payer: Medicaid Other

## 2017-10-30 DIAGNOSIS — O99341 Other mental disorders complicating pregnancy, first trimester: Secondary | ICD-10-CM | POA: Diagnosis not present

## 2017-10-30 DIAGNOSIS — F329 Major depressive disorder, single episode, unspecified: Secondary | ICD-10-CM | POA: Insufficient documentation

## 2017-10-30 DIAGNOSIS — Z3A01 Less than 8 weeks gestation of pregnancy: Secondary | ICD-10-CM | POA: Diagnosis not present

## 2017-10-30 DIAGNOSIS — O021 Missed abortion: Secondary | ICD-10-CM | POA: Insufficient documentation

## 2017-10-30 DIAGNOSIS — O034 Incomplete spontaneous abortion without complication: Secondary | ICD-10-CM | POA: Diagnosis not present

## 2017-10-30 DIAGNOSIS — O209 Hemorrhage in early pregnancy, unspecified: Secondary | ICD-10-CM | POA: Diagnosis present

## 2017-10-30 LAB — HCG, QUANTITATIVE, PREGNANCY: HCG, BETA CHAIN, QUANT, S: 3079 m[IU]/mL — AB (ref <5)

## 2017-10-30 LAB — BETA HCG QUANT (REF LAB): hCG Quant: 4194 m[IU]/mL

## 2017-10-30 NOTE — MAU Provider Note (Signed)
History     CSN: [id number]  Arrival date and time: 10/30/17 2225   First Provider Initiated Contact with Patient 10/30/17 2305      Chief Complaint  Patient presents with  . Vaginal Bleeding  . Abdominal Pain   HPI Makayla Fletcher is a 22 y.o. G2P0020 at [redacted]w[redacted]d who presents with vaginal bleeding and cramping. She took cytotec for a missed AB on 4/30 and states she bled until Thursday 5/9. She states today the bleeding came back heavy like a period and has cramping that she rates a 10/10. She has not tried anything for the pain. She was seen in the office yesterday for repeat blood work but has not been told the results.   OB History    Gravida  2   Para      Term      Preterm      AB  2   Living        SAB  2   TAB      Ectopic      Multiple      Live Births              Past Medical History:  Diagnosis Date  . Depression   . Migraine without aura, without mention of intractable migraine without mention of status migrainosus   . Sickle cell anemia (HCC)   . Urinary tract infection July 2013   frequent     Past Surgical History:  Procedure Laterality Date  . NO PAST SURGERIES      Family History  Problem Relation Age of Onset  . Migraines Father     Social History   Tobacco Use  . Smoking status: Never Smoker  . Smokeless tobacco: Never Used  Substance Use Topics  . Alcohol use: Yes    Comment: Wine  . Drug use: No    Allergies: No Known Allergies  Medications Prior to Admission  Medication Sig Dispense Refill Last Dose  . folic acid (FOLVITE) 1 MG tablet TAKE 1 TABLET (1 MG TOTAL) BY MOUTH DAILY. (Patient taking differently: Take 1 mg by mouth daily. ) 30 tablet 6 10/30/2017 at Unknown time  . ibuprofen (ADVIL,MOTRIN) 600 MG tablet Take 1 tablet (600 mg total) by mouth every 6 (six) hours as needed. 30 tablet 0 10/30/2017 at Unknown time  . oxyCODONE-acetaminophen (PERCOCET/ROXICET) 5-325 MG tablet Take 1 tablet by mouth every 4 (four)  hours as needed for severe pain. 10 tablet 0 Past Week at Unknown time  . Vitamin D, Ergocalciferol, (DRISDOL) 50000 units CAPS capsule TAKE 1 CAPSULE (50,000 UNITS TOTAL) BY MOUTH ONCE A WEEK. (Patient taking differently: Take 50,000 Units by mouth every 7 (seven) days. On Saturdays) 12 capsule 3 Past Week at Unknown time  . misoprostol (CYTOTEC) 200 MCG tablet Take 4 tablets (800 mcg total) by mouth once for 1 dose. 4 tablet 0     Review of Systems  Constitutional: Negative.  Negative for fatigue and fever.  HENT: Negative.   Respiratory: Negative.  Negative for shortness of breath.   Cardiovascular: Negative.  Negative for chest pain.  Gastrointestinal: Positive for abdominal pain. Negative for constipation, diarrhea, nausea and vomiting.  Genitourinary: Positive for vaginal bleeding. Negative for dysuria.  Neurological: Negative.  Negative for dizziness and headaches.   Physical Exam   Blood pressure 112/75, pulse 90, height  (1.575 m), weight 123 lb 0.6 oz (55.8 kg), last menstrual period 08/08/2017, unknown if currently breastfeeding.  Physical  Exam  Nursing note and vitals reviewed. Constitutional: She is oriented to person, place, and time. She appears well-developed and well-nourished. No distress.  HENT:  Head: Normocephalic.  Eyes: Pupils are equal, round, and reactive to light.  Cardiovascular: Normal rate, regular rhythm and normal heart sounds.  Respiratory: Effort normal and breath sounds normal. No respiratory distress.  GI: Soft. Bowel sounds are normal. She exhibits no distension. There is no tenderness.  Neurological: She is alert and oriented to person, place, and time.  Skin: Skin is warm and dry.  Psychiatric: She has a normal mood and affect. Her behavior is normal. Judgment and thought content normal.    MAU Course  Procedures Results for orders placed or performed during the hospital encounter of 10/30/17 (from the past 24 hour(s))  CBC     Status:  Abnormal   Collection Time: 10/30/17 11:02 PM  Result Value Ref Range   WBC 6.0 4.0 - 10.5 K/uL   RBC 3.42 (L) 3.87 - 5.11 MIL/uL   Hemoglobin 10.3 (L) 12.0 - 15.0 g/dL   HCT 16.1 (L) 09.6 - 04.5 %   MCV 81.0 78.0 - 100.0 fL   MCH 30.1 26.0 - 34.0 pg   MCHC 37.2 (H) 30.0 - 36.0 g/dL   RDW 40.9 81.1 - 91.4 %   Platelets 120 (L) 150 - 400 K/uL  hCG, quantitative, pregnancy     Status: Abnormal   Collection Time: 10/30/17 11:02 PM  Result Value Ref Range   hCG, Beta Chain, Quant, S 3,079 (H) <5 mIU/mL   US Ob Transvaginal  Result Date: 10/30/2017 CLINICAL DATA:  Vaginal bleeding and pain. Recent procedure for missed abortion. EXAM: TRANSVAGINAL OB ULTRASOUND TECHNIQUE: Transvaginal ultrasound was performed for complete evaluation of the gestation as well as the maternal uterus, adnexal regions, and pelvic cul-de-sac. COMPARISON:  10/11/2017 FINDINGS: Intrauterine gestational sac: None Yolk sac:  Not Visualized. Embryo:  Not Visualized. Cardiac Activity: Not Visualized. Heart Rate: None Subchorionic hemorrhage:  None visualized. Maternal uterus/adnexae: Heterogeneously thickened endometrium and endometrial cavity with hypervascular flow noted in the fundal portion. Retained products of conception may account for this appearance. The previously noted intrauterine gestation is no longer present. Ovaries are normal. IMPRESSION: Heterogeneously thickened endometrium with hypervascular material noted within, suspicious for retained products of conception admixed with clot. Previously noted intrauterine gestation is no longer identified. Electronically Signed   By: Tollie Eth M.D.   On: 10/30/2017 23:50   MDM CBC HCG US OB Transvaginal  Results discussed with patient. Patient strongly desires to repeat cytotec. Vaginal bleeding and pain precautions reviewed with patient. Emphasized importance of keeping follow up appointments in office.  Early Intrauterine Pregnancy Failure  _X__  Documented  intrauterine pregnancy failure less than or equal to [redacted] weeks gestation  _X__  No serious current illness  _X__  Baseline Hgb greater than or equal to 10g/dl  _X__  Patient has easily accessible transportation to the hospital  _X__  Clear preference  _X__  Practitioner/physician deems patient reliable  _X__  Counseling by practitioner or physician  _X__  Patient education by RN  _X__  Consent form signed  _n/a__  Rho-Gam given by RN if indicated  _X__ Medication dispensed   _X__   Cytotec 800 mcg  _X_  Buccally by patient at home         __   Intravaginally by RN in MAU        __   Rectally by patient at home  __   Rectally by RN in MAU  _X__  Ibuprofen 600 mg 1 tablet by mouth every 6 hours as needed #30  _X__  Hydrocodone/acetaminophen 5/325 mg by mouth every 4 to 6 hours as needed  _X__  Phenergan 12.5 mg by mouth every 4 hours as needed for nausea   Assessment and Plan   1. Retained products of conception after miscarriage   2. [redacted] weeks gestation of pregnancy    -Discharge home in stable condition -Rx for cytotec, ibuprofen, percocet and phenergan given to patient -Vaginal bleeding and pain precautions discussed -Patient advised to follow-up with [facility] in 1 week for repeat HCG -Patient may return to MAU as needed or if her condition were to change or worsen  Rolm Bookbinder CNM 10/30/2017, 11:05 PM

## 2017-10-30 NOTE — MAU Note (Signed)
Pt presents to MAU with cramping and bleeding.  States took "the pill" 10/19/17  Never stopped bleeding.  Went to office yesterday and had blood work but no one called with results.  Today cramping just got worse.

## 2017-10-31 DIAGNOSIS — O034 Incomplete spontaneous abortion without complication: Secondary | ICD-10-CM

## 2017-10-31 LAB — CBC
HCT: 27.7 % — ABNORMAL LOW (ref 36.0–46.0)
Hemoglobin: 10.3 g/dL — ABNORMAL LOW (ref 12.0–15.0)
MCH: 30.1 pg (ref 26.0–34.0)
MCHC: 37.2 g/dL — ABNORMAL HIGH (ref 30.0–36.0)
MCV: 81 fL (ref 78.0–100.0)
PLATELETS: 120 10*3/uL — AB (ref 150–400)
RBC: 3.42 MIL/uL — ABNORMAL LOW (ref 3.87–5.11)
RDW: 14.4 % (ref 11.5–15.5)
WBC: 6 10*3/uL (ref 4.0–10.5)

## 2017-10-31 MED ORDER — MISOPROSTOL 200 MCG PO TABS: 800.0000 ug | ORAL_TABLET | Freq: Once | ORAL | 0 refills | Status: DC

## 2017-10-31 MED ORDER — PROMETHAZINE HCL 25 MG PO TABS: 25.0000 mg | ORAL_TABLET | Freq: Four times a day (QID) | ORAL | 0 refills | Status: DC | PRN

## 2017-10-31 MED ORDER — IBUPROFEN 800 MG PO TABS: 800.0000 mg | ORAL_TABLET | Freq: Three times a day (TID) | ORAL | 0 refills | Status: DC

## 2017-10-31 MED ORDER — OXYCODONE-ACETAMINOPHEN 5-325 MG PO TABS: 2.0000 | ORAL_TABLET | ORAL | 0 refills | Status: DC | PRN

## 2017-10-31 NOTE — Discharge Instructions (Signed)

## 2017-11-01 ENCOUNTER — Telehealth: Payer: Self-pay | Admitting: General Practice

## 2017-11-01 NOTE — Telephone Encounter (Signed)
Called patient, but was unable to leave VM in regards to appointments on 11/08/17 (lab visit) and 11/16/17 (SAB f/u).  Appointment reminders mailed to patient.

## 2017-11-02 ENCOUNTER — Non-Acute Institutional Stay (HOSPITAL_COMMUNITY)
Admission: AD | Admit: 2017-11-02 | Discharge: 2017-11-02 | Disposition: A | Discharge status: Home / Self Care | Payer: Medicaid Other | Source: Ambulatory Visit | Attending: Internal Medicine | Admitting: Internal Medicine

## 2017-11-02 DIAGNOSIS — Z791 Long term (current) use of non-steroidal anti-inflammatories (NSAID): Secondary | ICD-10-CM | POA: Diagnosis not present

## 2017-11-02 DIAGNOSIS — D5700 Hb-SS disease with crisis, unspecified: Secondary | ICD-10-CM | POA: Diagnosis not present

## 2017-11-02 DIAGNOSIS — Z79891 Long term (current) use of opiate analgesic: Secondary | ICD-10-CM | POA: Insufficient documentation

## 2017-11-02 DIAGNOSIS — D57219 Sickle-cell/Hb-C disease with crisis, unspecified: Secondary | ICD-10-CM | POA: Insufficient documentation

## 2017-11-02 DIAGNOSIS — Z79899 Other long term (current) drug therapy: Secondary | ICD-10-CM | POA: Insufficient documentation

## 2017-11-02 LAB — CBC WITH DIFFERENTIAL/PLATELET
Basophils Absolute: 0 K/uL (ref 0.0–0.1)
Basophils Relative: 0 %
Eosinophils Absolute: 0.1 K/uL (ref 0.0–0.7)
Eosinophils Relative: 1 %
HCT: 27.4 % — ABNORMAL LOW (ref 36.0–46.0)
Hemoglobin: 9.8 g/dL — ABNORMAL LOW (ref 12.0–15.0)
Lymphocytes Relative: 9 %
Lymphs Abs: 0.9 K/uL (ref 0.7–4.0)
MCH: 29.3 pg (ref 26.0–34.0)
MCHC: 35.8 g/dL (ref 30.0–36.0)
MCV: 82 fL (ref 78.0–100.0)
Monocytes Absolute: 0.5 K/uL (ref 0.1–1.0)
Monocytes Relative: 5 %
Neutro Abs: 8.7 K/uL — ABNORMAL HIGH (ref 1.7–7.7)
Neutrophils Relative %: 85 %
Platelets: 118 K/uL — ABNORMAL LOW (ref 150–400)
RBC: 3.34 MIL/uL — ABNORMAL LOW (ref 3.87–5.11)
RDW: 14.7 % (ref 11.5–15.5)
WBC: 10.2 K/uL (ref 4.0–10.5)

## 2017-11-02 LAB — RETICULOCYTES
RBC.: 3.34 MIL/uL — ABNORMAL LOW (ref 3.87–5.11)
Retic Count, Absolute: 120.2 K/uL (ref 19.0–186.0)
Retic Ct Pct: 3.6 % — ABNORMAL HIGH (ref 0.4–3.1)

## 2017-11-02 LAB — BRAIN NATRIURETIC PEPTIDE: B Natriuretic Peptide: 42.9 pg/mL (ref 0.0–100.0)

## 2017-11-02 MED ORDER — DIPHENHYDRAMINE HCL 25 MG PO CAPS
25.0000 mg | ORAL_CAPSULE | ORAL | Status: DC | PRN
Start: 2017-11-02 — End: 2017-11-02

## 2017-11-02 MED ORDER — POLYETHYLENE GLYCOL 3350 17 G PO PACK: 17.0000 g | PACK | Freq: Every day | ORAL | Status: DC | PRN

## 2017-11-02 MED ORDER — HYDROMORPHONE 1 MG/ML IV SOLN
INTRAVENOUS | Status: DC
  Administered 2017-11-02: 4 mg via INTRAVENOUS
  Administered 2017-11-02: 13:00:00 via INTRAVENOUS
  Filled 2017-11-02: qty 25

## 2017-11-02 MED ORDER — DEXTROSE-NACL 5-0.45 % IV SOLN
INTRAVENOUS | Status: DC
  Administered 2017-11-02: 13:00:00 via INTRAVENOUS

## 2017-11-02 MED ORDER — ONDANSETRON HCL 4 MG/2ML IJ SOLN
4.0000 mg | Freq: Four times a day (QID) | INTRAMUSCULAR | Status: DC | PRN
  Administered 2017-11-02: 4 mg via INTRAVENOUS
  Filled 2017-11-02: qty 2

## 2017-11-02 MED ORDER — SODIUM CHLORIDE 0.9% FLUSH: 9.0000 mL | INTRAVENOUS | Status: DC | PRN

## 2017-11-02 MED ORDER — NALOXONE HCL 0.4 MG/ML IJ SOLN: 0.4000 mg | INTRAMUSCULAR | Status: DC | PRN

## 2017-11-02 MED ORDER — SODIUM CHLORIDE 0.9 % IV SOLN
25.0000 mg | INTRAVENOUS | Status: DC | PRN
  Filled 2017-11-02: qty 0.5

## 2017-11-02 MED ORDER — KETOROLAC TROMETHAMINE 30 MG/ML IJ SOLN
30.0000 mg | Freq: Four times a day (QID) | INTRAMUSCULAR | Status: DC
  Administered 2017-11-02: 30 mg via INTRAVENOUS
  Filled 2017-11-02: qty 1

## 2017-11-02 MED ORDER — SENNOSIDES-DOCUSATE SODIUM 8.6-50 MG PO TABS: 1.0000 | ORAL_TABLET | Freq: Two times a day (BID) | ORAL | Status: DC

## 2017-11-02 NOTE — Discharge Instructions (Signed)
Sickle Cell Anemia, Adult °Sickle cell anemia is a condition where your red blood cells are shaped like sickles. Red blood cells carry oxygen through the body. Sickle-shaped red blood cells do not live as long as normal red blood cells. They also clump together and block blood from flowing through the blood vessels. These things prevent the body from getting enough oxygen. Sickle cell anemia causes organ damage and pain. It also increases the risk of infection. °Follow these instructions at home: °· Drink enough fluid to keep your pee (urine) clear or pale yellow. Drink more in hot weather and during exercise. °· Do not smoke. Smoking lowers oxygen levels in the blood. °· Only take over-the-counter or prescription medicines as told by your doctor. °· Take antibiotic medicines as told by your doctor. Make sure you finish them even if you start to feel better. °· Take supplements as told by your doctor. °· Consider wearing a medical alert bracelet. This tells anyone caring for you in an emergency of your condition. °· When traveling, keep your medical information, doctors' names, and the medicines you take with you at all times. °· If you have a fever, do not take fever medicines right away. This could cover up a problem. Tell your doctor. °· Keep all follow-up visits with your doctor. Sickle cell anemia requires regular medical care. °Contact a doctor if: °You have a fever. °Get help right away if: °· You feel dizzy or faint. °· You have new belly (abdominal) pain, especially on the left side near the stomach area. °· You have a lasting, often uncomfortable and painful erection of the penis (priapism). If it is not treated right away, you will become unable to have sex (impotence). °· You have numbness in your arms or legs or you have a hard time moving them. °· You have a hard time talking. °· You have a fever or lasting symptoms for more than 2-3 days. °· You have a fever and your symptoms suddenly get  worse. °· You have signs or symptoms of infection. These include: °? Chills. °? Being more tired than normal (lethargy). °? Irritability. °? Poor eating. °? Throwing up (vomiting). °· You have pain that is not helped with medicine. °· You have shortness of breath. °· You have pain in your chest. °· You are coughing up pus-like or bloody mucus. °· You have a stiff neck. °· Your feet or hands swell or have pain. °· Your belly looks bloated. °· Your joints hurt. °This information is not intended to replace advice given to you by your health care provider. Make sure you discuss any questions you have with your health care provider. °Document Released: 03/29/2013 Document Revised: 11/14/2015 Document Reviewed: 01/18/2013 °Elsevier Interactive Patient Education © 2017 [employer] ° °

## 2017-11-02 NOTE — Progress Notes (Signed)
Wasted 20 mg of Dilaudid PCA in sink with Windy Carina, RN.

## 2017-11-02 NOTE — H&P (Signed)
[facility] History and Physical  Makayla Fletcher ZOX:[id number] DOB: 15-Apr-1996 DOA: 11/02/2017  PCP: Massie Maroon, FNP   Chief Complaint: Pain  HPI: Makayla Fletcher is a 22 y.o. female with history of sickle cell disease, HbSC who presents to the day hospital today with a chief complaint of bilateral lower leg pain similar to her sickle cell pain crisis. She denies fever, chills, chest pain, dyspnea, lower extremity edema, cough, palpitations, numbness, weakness, or N/V/D.  No new falls or injuries. She rates her pain at 10/10  She was treated on April 30 with Cytotec for a missed abortion, she had vaginal bleeding until May 9. On May 11, vaginal bleeding returned. She was reevaluated at [facility] on 5/11 for worsening abdominal cramping and was discharged with a second round of Cytotec after transvaginal ultrasound was suggestive of retained products of conception. She was advised to follow-up with [facility] in 1 week for repeat hCG. She has been taking the ibuprofen, Percocet, and Phenergan [facility] with no improvement in her leg pain.   Systemic Review: General: The patient denies anorexia, fever, weight loss Cardiac: Denies chest pain, syncope, palpitations, pedal edema  Respiratory: Denies cough, shortness of breath, wheezing GI: Denies severe indigestion/heartburn, abdominal pain, nausea, vomiting, diarrhea and constipation GU: Denies hematuria, incontinence, dysuria  Musculoskeletal: Denies arthritis  Skin: Denies suspicious skin lesions Neurologic: Denies focal weakness or numbness, change in vision  Past Medical History:  Diagnosis Date  . Depression   . Migraine without aura, without mention of intractable migraine without mention of status migrainosus   . Sickle cell anemia (HCC)   . Urinary tract infection July 2013   frequent     Past Surgical History:  Procedure Laterality Date  . NO PAST SURGERIES      No Known  Allergies  Family History  Problem Relation Age of Onset  . Migraines Father       Prior to Admission medications   Medication Sig Start Date End Date Taking? Authorizing Provider  folic acid (FOLVITE) 1 MG tablet TAKE 1 TABLET (1 MG TOTAL) BY MOUTH DAILY. Patient taking differently: Take 1 mg by mouth daily.  03/08/17   Massie Maroon, FNP  ibuprofen (ADVIL,MOTRIN) 800 MG tablet Take 1 tablet (800 mg total) by mouth 3 (three) times daily. 10/31/17   Rolm Bookbinder, CNM  misoprostol (CYTOTEC) 200 MCG tablet Take 4 tablets (800 mcg total) by mouth once for 1 dose. 10/31/17 10/31/17  Rolm Bookbinder, CNM  oxyCODONE-acetaminophen (PERCOCET/ROXICET) 5-325 MG tablet Take 2 tablets by mouth every 4 (four) hours as needed for severe pain. 10/31/17   Rolm Bookbinder, CNM  promethazine (PHENERGAN) 25 MG tablet Take 1 tablet (25 mg total) by mouth every 6 (six) hours as needed for nausea or vomiting. 10/31/17   Rolm Bookbinder, CNM  Vitamin D, Ergocalciferol, (DRISDOL) 50000 units CAPS capsule TAKE 1 CAPSULE (50,000 UNITS TOTAL) BY MOUTH ONCE A WEEK. Patient taking differently: Take 50,000 Units by mouth every 7 (seven) days. On Saturdays 03/09/17   Massie Maroon, FNP     Physical Exam: There were no vitals filed for this visit.  General: Alert, awake, afebrile, anicteric, not in obvious distress HEENT: Normocephalic and Atraumatic, Mucous membranes pink                PERRLA; EOM intact; No scleral icterus,  Nares: Patent, Oropharynx: Clear, Fair Dentition                 Neck: FROM, no cervical lymphadenopathy, thyromegaly, carotid bruit or JVD;  CHEST WALL: No tenderness  CHEST: Normal respiration, clear to auscultation bilaterally  HEART: Regular rate and rhythm; no murmurs rubs or gallops  BACK: No kyphosis or scoliosis; no CVA tenderness  ABDOMEN: Positive Bowel Sounds, soft, non-tender; no masses, no organomegaly EXTREMITIES: No cyanosis, clubbing, or  edema SKIN:  no rash or ulceration  CNS: Alert and Oriented x 4, Nonfocal exam, CN 2-12 intact  Labs on Admission:  Basic Metabolic Panel: No results for input(s): NA, K, CL, CO2, GLUCOSE, BUN, CREATININE, CALCIUM, MG, PHOS in the last 168 hours. Liver Function Tests: No results for input(s): AST, ALT, ALKPHOS, BILITOT, PROT, ALBUMIN in the last 168 hours. No results for input(s): LIPASE, AMYLASE in the last 168 hours. No results for input(s): AMMONIA in the last 168 hours. CBC: Recent Labs  Lab 10/30/17 2302  WBC 6.0  HGB 10.3*  HCT 27.7*  MCV 81.0  PLT 120*   Cardiac Enzymes: No results for input(s): CKTOTAL, CKMB, CKMBINDEX, TROPONINI in the last 168 hours.  BNP (last 3 results) No results for input(s): BNP in the last 8760 hours.  ProBNP (last 3 results) No results for input(s): PROBNP in the last 8760 hours.  CBG: No results for input(s): GLUCAP in the last 168 hours.   Assessment/Plan Active Problems:   Sickle cell anemia with crisis (HCC)   Ad[facility] the Day [facility]  IVF D5 .45% Saline @ 125 mls/hour  Weight based Dilaudid PCA started within 30 minutes of admission  IV Toradol 30 mg Q 6 H  Monitor vitals very closely, Re-evaluate pain scale every hour  2 L of Oxygen by [place]  Patient will be re-evaluated for pain in the context of function and relationship to baseline as care progresses.  If no significant relieve from pain (remains above 5/10) will transfer patient to inpatient services for further evaluation and management  Code Status: Full  Family Communication: None  DVT Prophylaxis: Ambulate as tolerated   Time spent: 35 Minutes  Jeanann Lewandowsky, MD, MHA, FACP, FAAP, CPE  If 7PM-7AM, please contact night-coverage [email or web address] 11/02/2017, 12:16 PM

## 2017-11-02 NOTE — Discharge Summary (Signed)
Physician Discharge Summary  HAYSLEE CASEBOLT ZOX:[id number] DOB: 1996-01-28 DOA: 11/02/2017  PCP: Massie Maroon, FNP  Admit date: 11/02/2017  Discharge date: 11/02/2017  Time spent: 30 minutes  Discharge Diagnoses:  Active Problems:   Sickle cell anemia with crisis [facility])   Discharge Condition: Stable  Diet recommendation: Regular  History of present illness:  Makayla Fletcher is a 22 y.o. female with history of sickle cell disease, HbSC who presents to the day hospital today with a chief complaint of bilateral lower leg pain similar to her sickle cell pain crisis. She denies fever, chills, chest pain, dyspnea, lower extremity edema, cough, palpitations, numbness, weakness, or N/V/D.  No new falls or injuries. She rates her pain at 10/10  She was treated on April 30 with Cytotec for a missed abortion, she had vaginal bleeding until May 9. On May 11, vaginal bleeding returned. She was reevaluated at [facility] on 5/11 for worsening abdominal cramping and was discharged with a second round of Cytotec after transvaginal ultrasound was suggestive of retained products of conception. She was advised to follow-up with [facility] in 1 week for repeat hCG. She has been taking the ibuprofen, Percocet, and Phenergan she [facility] with no improvement in her leg pain.   Hospital Course:  MYRANDA PAVONE was admitted to the day hospital with sickle cell painful crisis. Patient was treated with weight based IV Dilaudid PCA, IV Toradol, clinician assisted doses as deemed appropriate and IV fluids. She showed significant improvement symptomatically, pain improved from 10 to 2/10 at the time of discharge. Patient was discharged home in a hemodynamically stable condition. Ariele will follow-up at the clinic as previously scheduled, continue with home medications as per prior to admission.  Discharge Instructions We discussed the need for good hydration, monitoring of hydration status,  avoidance of heat, cold, stress, and infection triggers. We discussed the need to be compliant with taking Hydrea and other home medications. Ellery was reminded of the need to seek medical attention immediately if any symptom of bleeding, anemia, or infection occurs.  Discharge Exam: Vitals:   11/02/17 1234 11/02/17 1335  BP: (!) 94/53 (!) 94/52  Pulse: 94 95  Resp: 18 18  Temp: 98.9 F (37.2 C)   SpO2: 100% 100%   General appearance: alert, cooperative and no distress Eyes: conjunctivae/corneas clear. PERRL, EOM's intact. Fundi benign. Neck: no adenopathy, no carotid bruit, no JVD, supple, symmetrical, trachea midline and thyroid not enlarged, symmetric, no tenderness/mass/nodules Back: symmetric, no curvature. ROM normal. No CVA tenderness. Resp: clear to auscultation bilaterally Chest wall: no tenderness Cardio: regular rate and rhythm, S1, S2 normal, no murmur, click, rub or gallop GI: soft, non-tender; bowel sounds normal; no masses, no organomegaly Extremities: extremities normal, atraumatic, no cyanosis or edema Pulses: 2+ and symmetric Skin: Skin color, texture, turgor normal. No rashes or lesions Neurologic: Grossly normal  Discharge Instructions    Diet - low sodium heart healthy   Complete by:  As directed    Increase activity slowly   Complete by:  As directed      Allergies as of 11/02/2017   No Known Allergies     Medication List    TAKE these medications   folic acid 1 MG tablet Commonly known as:  FOLVITE TAKE 1 TABLET (1 MG TOTAL) BY MOUTH DAILY. What changed:    how much to take  how to take this  when to take this  additional instructions   ibuprofen 800 MG  tablet Commonly known as:  ADVIL,MOTRIN Take 1 tablet (800 mg total) by mouth 3 (three) times daily.   misoprostol 200 MCG tablet Commonly known as:  CYTOTEC Take 4 tablets (800 mcg total) by mouth once for 1 dose.   oxyCODONE-acetaminophen 5-325 MG tablet Commonly known as:   PERCOCET/ROXICET Take 2 tablets by mouth every 4 (four) hours as needed for severe pain.   promethazine 25 MG tablet Commonly known as:  PHENERGAN Take 1 tablet (25 mg total) by mouth every 6 (six) hours as needed for nausea or vomiting.   Vitamin D (Ergocalciferol) 50000 units Caps capsule Commonly known as:  DRISDOL TAKE 1 CAPSULE (50,000 UNITS TOTAL) BY MOUTH ONCE A WEEK. What changed:    how much to take  how to take this  when to take this  additional instructions      No Known Allergies   Significant Diagnostic Studies: US Ob Transvaginal  Result Date: 10/30/2017 CLINICAL DATA:  Vaginal bleeding and pain. Recent procedure for missed abortion. EXAM: TRANSVAGINAL OB ULTRASOUND TECHNIQUE: Transvaginal ultrasound was performed for complete evaluation of the gestation as well as the maternal uterus, adnexal regions, and pelvic cul-de-sac. COMPARISON:  10/11/2017 FINDINGS: Intrauterine gestational sac: None Yolk sac:  Not Visualized. Embryo:  Not Visualized. Cardiac Activity: Not Visualized. Heart Rate: None Subchorionic hemorrhage:  None visualized. Maternal uterus/adnexae: Heterogeneously thickened endometrium and endometrial cavity with hypervascular flow noted in the fundal portion. Retained products of conception may account for this appearance. The previously noted intrauterine gestation is no longer present. Ovaries are normal. IMPRESSION: Heterogeneously thickened endometrium with hypervascular material noted within, suspicious for retained products of conception admixed with clot. Previously noted intrauterine gestation is no longer identified. Electronically Signed   By: Tollie Eth M.D.   On: 10/30/2017 23:50   US Ob Transvaginal  Result Date: 10/11/2017 CLINICAL DATA:  Vaginal bleeding today. EXAM: TRANSVAGINAL OB ULTRASOUND TECHNIQUE: Transvaginal ultrasound was performed for complete evaluation of the gestation as well as the maternal uterus, adnexal regions, and pelvic  cul-de-sac. COMPARISON:  10/08/2017 FINDINGS: Intrauterine gestational sac: Single Yolk sac:  Not seen Embryo:  Present Cardiac Activity: Not present CRL: 12.7 mm   7 w 3 d Subchorionic hemorrhage:  Small Maternal uterus/adnexae: Normal adnexa. No free fluid. IMPRESSION: Single intrauterine pregnancy corresponding to 7 weeks and 3 days gestation with no fetal cardiac activity, consistent with fetal demise. These results were called by telephone at the time of interpretation on 10/11/2017 at 7:48 pm to Dr. Victorino Dike [facility] , who verbally acknowledged these results. Electronically Signed   By: Ted Mcalpine M.D.   On: 10/11/2017 19:49   US Ob Transvaginal  Result Date: 10/08/2017 CLINICAL DATA:  Pregnant patient with spotting. EXAM: TRANSVAGINAL OB ULTRASOUND TECHNIQUE: Transvaginal ultrasound was performed for complete evaluation of the gestation as well as the maternal uterus, adnexal regions, and pelvic cul-de-sac. COMPARISON:  Ob ultrasound 10/06/2017. FINDINGS: Intrauterine gestational sac: Visualized. Yolk sac:  Visualized. Embryo:  Visualized. Cardiac Activity: Detected. Heart Rate: 148 bpm CRL: 9.4  mm   6 w 6 d                  [place] EDC: 05/28/2018. Subchorionic hemorrhage:  None visualized. Maternal uterus/adnexae: Small corpus luteum cyst on the left noted. IMPRESSION: Single living intrauterine pregnancy.  No acute finding. Electronically Signed   By: Drusilla Kanner M.D.   On: 10/08/2017 20:34   US Ob Transvaginal  Result Date: 10/06/2017 CLINICAL DATA:  Acute onset of vaginal  bleeding. EXAM: TRANSVAGINAL OB ULTRASOUND TECHNIQUE: Transvaginal ultrasound was performed for complete evaluation of the gestation as well as the maternal uterus, adnexal regions, and pelvic cul-de-sac. COMPARISON:  Pelvic ultrasound performed 09/29/2017 FINDINGS: Intrauterine gestational sac: Single; visualized and normal in shape. Yolk sac:  Yes Embryo:  Yes Cardiac Activity: Yes Heart Rate: 130 bpm CRL: 7.5  mm   6 w  4 d                  [place] EDC: 05/28/2018 Subchorionic hemorrhage:  None visualized. Maternal uterus/adnexae: The uterus is otherwise unremarkable. The ovaries are within normal limits. The right ovary measures 3.4 x 2.1 x 1.4 cm and 1.5 x 2.1 cm. No suspicious adnexal masses are seen; there is no evidence for ovarian torsion. No free fluid is seen within the pelvic cul-de-sac. IMPRESSION: Single live intrauterine pregnancy, with a crown-rump length of 8 mm, corresponding to a gestational age of [redacted] weeks 4 days. This matches the gestational age of [redacted] weeks 5 days by the first ultrasound, reflecting an estimated date of delivery of May 27, 2018. Electronically Signed   By: Roanna Raider M.D.   On: 10/06/2017 22:10    Signed:  Jeanann Lewandowsky MD, MHA, FACP, FAAP, CPE   11/02/2017, 2:58 PM

## 2017-11-02 NOTE — Progress Notes (Signed)
Patient admitted to day hospital for pain in bilateral knees rated 8/10. Patient placed on Dilaudid PCA, given IV Toradol and hydrated with IV fluids. At discharge patient rated pain at 2/10. Discharge instructions given to patient. Patient alert, oriented and ambulatory to wheelchair at discharge.

## 2017-11-03 ENCOUNTER — Encounter (HOSPITAL_COMMUNITY): Payer: Self-pay

## 2017-11-03 ENCOUNTER — Emergency Department (HOSPITAL_COMMUNITY)
Admission: EM | Admit: 2017-11-03 | Discharge: 2017-11-03 | Disposition: A | Discharge status: Home / Self Care | Payer: Medicaid Other | Attending: Emergency Medicine | Admitting: Emergency Medicine

## 2017-11-03 DIAGNOSIS — E876 Hypokalemia: Secondary | ICD-10-CM | POA: Diagnosis not present

## 2017-11-03 DIAGNOSIS — D5700 Hb-SS disease with crisis, unspecified: Secondary | ICD-10-CM

## 2017-11-03 DIAGNOSIS — D57219 Sickle-cell/Hb-C disease with crisis, unspecified: Secondary | ICD-10-CM | POA: Diagnosis present

## 2017-11-03 DIAGNOSIS — Z79899 Other long term (current) drug therapy: Secondary | ICD-10-CM | POA: Insufficient documentation

## 2017-11-03 LAB — COMPREHENSIVE METABOLIC PANEL
ALBUMIN: 4.5 g/dL (ref 3.5–5.0)
ALK PHOS: 48 U/L (ref 38–126)
ALT: 15 U/L (ref 14–54)
ANION GAP: 12 (ref 5–15)
AST: 22 U/L (ref 15–41)
BUN: 6 mg/dL (ref 6–20)
CHLORIDE: 103 mmol/L (ref 101–111)
CO2: 23 mmol/L (ref 22–32)
CREATININE: 0.6 mg/dL (ref 0.44–1.00)
Calcium: 9.5 mg/dL (ref 8.9–10.3)
Glucose, Bld: 93 mg/dL (ref 65–99)
POTASSIUM: 3.2 mmol/L — AB (ref 3.5–5.1)
SODIUM: 138 mmol/L (ref 135–145)
Total Bilirubin: 1.7 mg/dL — ABNORMAL HIGH (ref 0.3–1.2)
Total Protein: 8.2 g/dL — ABNORMAL HIGH (ref 6.5–8.1)

## 2017-11-03 LAB — CBC WITH DIFFERENTIAL/PLATELET
Basophils Absolute: 0 10*3/uL (ref 0.0–0.1)
Basophils Relative: 0 %
EOS ABS: 0.1 10*3/uL (ref 0.0–0.7)
Eosinophils Relative: 1 %
HEMATOCRIT: 28.8 % — AB (ref 36.0–46.0)
HEMOGLOBIN: 10.3 g/dL — AB (ref 12.0–15.0)
LYMPHS ABS: 1.5 10*3/uL (ref 0.7–4.0)
LYMPHS PCT: 14 %
MCH: 29.3 pg (ref 26.0–34.0)
MCHC: 35.8 g/dL (ref 30.0–36.0)
MCV: 82.1 fL (ref 78.0–100.0)
Monocytes Absolute: 0.7 10*3/uL (ref 0.1–1.0)
Monocytes Relative: 7 %
NEUTROS ABS: 8 10*3/uL — AB (ref 1.7–7.7)
NEUTROS PCT: 78 %
Platelets: 123 10*3/uL — ABNORMAL LOW (ref 150–400)
RBC: 3.51 MIL/uL — AB (ref 3.87–5.11)
RDW: 14.7 % (ref 11.5–15.5)
WBC: 10.3 10*3/uL (ref 4.0–10.5)

## 2017-11-03 LAB — RETICULOCYTES
RBC.: 3.51 MIL/uL — ABNORMAL LOW (ref 3.87–5.11)
Retic Count, Absolute: 98.3 10*3/uL (ref 19.0–186.0)
Retic Ct Pct: 2.8 % (ref 0.4–3.1)

## 2017-11-03 LAB — HCG, QUANTITATIVE, PREGNANCY: HCG, BETA CHAIN, QUANT, S: 327 m[IU]/mL — AB (ref <5)

## 2017-11-03 MED ORDER — HYDROMORPHONE HCL 1 MG/ML IJ SOLN: 1.0000 mg | INTRAMUSCULAR | Status: AC

## 2017-11-03 MED ORDER — HYDROMORPHONE HCL 1 MG/ML IJ SOLN: 1.0000 mg | INTRAMUSCULAR | Status: DC

## 2017-11-03 MED ORDER — KETOROLAC TROMETHAMINE 30 MG/ML IJ SOLN
30.0000 mg | INTRAMUSCULAR | Status: AC
  Administered 2017-11-03: 30 mg via INTRAVENOUS
  Filled 2017-11-03: qty 1

## 2017-11-03 MED ORDER — DEXTROSE-NACL 5-0.45 % IV SOLN
INTRAVENOUS | Status: DC
  Administered 2017-11-03: 07:00:00 via INTRAVENOUS

## 2017-11-03 MED ORDER — HYDROMORPHONE HCL 1 MG/ML IJ SOLN
0.5000 mg | INTRAMUSCULAR | Status: AC
  Administered 2017-11-03: 0.5 mg via INTRAVENOUS
  Filled 2017-11-03: qty 1

## 2017-11-03 MED ORDER — ACETAMINOPHEN 500 MG PO TABS: 1000.0000 mg | ORAL_TABLET | Freq: Three times a day (TID) | ORAL | 0 refills | Status: DC | PRN

## 2017-11-03 MED ORDER — HYDROMORPHONE HCL 1 MG/ML IJ SOLN: 0.5000 mg | INTRAMUSCULAR | Status: AC

## 2017-11-03 MED ORDER — ONDANSETRON HCL 4 MG/2ML IJ SOLN
4.0000 mg | INTRAMUSCULAR | Status: DC | PRN
  Administered 2017-11-03: 4 mg via INTRAVENOUS
  Filled 2017-11-03: qty 2

## 2017-11-03 MED ORDER — POTASSIUM CHLORIDE ER 20 MEQ PO TBCR: 20.0000 meq | EXTENDED_RELEASE_TABLET | Freq: Every day | ORAL | 0 refills | Status: DC

## 2017-11-03 MED ORDER — HYDROMORPHONE HCL 1 MG/ML IJ SOLN
1.0000 mg | INTRAMUSCULAR | Status: AC
  Administered 2017-11-03: 1 mg via INTRAVENOUS
  Filled 2017-11-03: qty 1

## 2017-11-03 MED ORDER — IBUPROFEN 800 MG PO TABS: 800.0000 mg | ORAL_TABLET | Freq: Three times a day (TID) | ORAL | 0 refills | Status: DC

## 2017-11-03 MED ORDER — POTASSIUM CHLORIDE CRYS ER 20 MEQ PO TBCR
40.0000 meq | EXTENDED_RELEASE_TABLET | Freq: Once | ORAL | Status: AC
  Administered 2017-11-03: 40 meq via ORAL
  Filled 2017-11-03: qty 2

## 2017-11-03 MED ORDER — DIPHENHYDRAMINE HCL 50 MG/ML IJ SOLN
25.0000 mg | Freq: Once | INTRAMUSCULAR | Status: AC
  Administered 2017-11-03: 25 mg via INTRAVENOUS
  Filled 2017-11-03: qty 1

## 2017-11-03 NOTE — ED Provider Notes (Signed)
[facility]-EMERGENCY DEPT Provider Note   CSN: [id number] Arrival date & time: 11/03/17  0515     History   Chief Complaint No chief complaint on file.   HPI Makayla Fletcher is a 22 y.o. female with a history of HbSC who presents to the emergency department with a chief complaint of bilateral lower leg pain.  The patient endorses throbbing, severe bilateral lower leg pain that began 2 days ago.  States the pain feels similar to sickle cell pain crisis she has had in the past, but typically only gets pain in one leg.  She denies fever, chills, chest pain, dyspnea, lower extremity edema, cough, palpitations, numbness, weakness, or N/V/D.  No new falls or injuries.  She was treated on April 30 with Cytotec for a missed abortion.  I review of her medical record states that she had vaginal bleeding until May 9.  On May 11, vaginal bleeding returned.  She was reevaluated at [facility] on 5/11 for worsening abdominal cramping and was discharged with a second round of Cytotec after transvaginal ultrasound was suggestive of retained products of conception.  She was advised to follow-up with [facility] in 1 week for repeat hCG.  She has been taking the ibuprofen, Percocet, and Phenergan she [facility] with no improvement in her leg pain.  She also reports that she was seen and treated yesterday at the sickle cell day clinic with initial improvement in her symptoms that worsened overnight.  She endorses continued abdominal cramping and vaginal bleeding, but reports that symptoms have significantly improved over the last 2 days.  No other chronic medical problems.  Medications at home include vitamin D supplements and folic acid.  She does not take any other daily home medications.  The history is provided by the patient. No language interpreter was used.    Past Medical History:  Diagnosis Date  . Depression   . Migraine without aura, without mention of  intractable migraine without mention of status migrainosus   . Sickle cell anemia (HCC)   . Urinary tract infection July 2013   frequent     Patient Active Problem List   Diagnosis Date Noted  . Sickle cell anemia with crisis (HCC) 11/02/2017  . Other fatigue 06/06/2015  . Vitamin D deficiency 06/06/2015  . Sickle cell pain crisis (HCC) 05/26/2015  . Sickle cell anemia with pain (HCC) 03/25/2015  . Hypoglycemia 03/20/2014  . Irregular heartbeat 03/13/2014  . Heart palpitations 03/13/2014  . Migraine without aura, without mention of intractable migraine without mention of status migrainosus 12/18/2013  . Dizziness 08/24/2013  . 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 Sinus tachycardia (HCC) 06/18/2012  . Hemoglobin [place] with crisis (HCC) 06/18/2012  . Menorrhagia with irregular cycle 06/18/2012  . Sickle cell anemia (HCC) 06/17/2012  . Hyponatremia 06/17/2012  . Depression, major, single episode, moderate (HCC) 07/31/2011  . Generalized anxiety disorder 07/31/2011    Past Surgical History:  Procedure Laterality Date  . NO PAST SURGERIES       OB History    Gravida  2   Para      Term      Preterm      AB  2   Living        SAB  2   TAB      Ectopic      Multiple      Live Births  Home Medications    Prior to Admission medications   Medication Sig Start Date End Date Taking? Authorizing Provider  folic acid (FOLVITE) 1 MG tablet TAKE 1 TABLET (1 MG TOTAL) BY MOUTH DAILY. Patient taking differently: Take 1 mg by mouth daily.  03/08/17  Yes Massie Maroon, FNP  Vitamin D, Ergocalciferol, (DRISDOL) 50000 units CAPS capsule TAKE 1 CAPSULE (50,000 UNITS TOTAL) BY MOUTH ONCE A WEEK. Patient taking differently: Take 50,000 Units by mouth every 7 (seven) days. On Saturdays 03/09/17  Yes Massie Maroon, FNP  acetaminophen (TYLENOL) 500 MG tablet Take 2 tablets (1,000 mg total) by mouth every 8 (eight) hours as  needed for moderate pain. 11/03/17   Takeila Thayne A, PA-C  ibuprofen (ADVIL,MOTRIN) 800 MG tablet Take 1 tablet (800 mg total) by mouth 3 (three) times daily. 11/03/17   Karlee Staff A, PA-C  oxyCODONE-acetaminophen (PERCOCET/ROXICET) 5-325 MG tablet Take 2 tablets by mouth every 4 (four) hours as needed for severe pain. Patient not taking: Reported on 11/03/2017 10/31/17   Rolm Bookbinder, CNM  potassium chloride 20 MEQ TBCR Take 20 mEq by mouth daily for 3 days. 11/03/17 11/06/17  Kasiya Burck A, PA-C  promethazine (PHENERGAN) 25 MG tablet Take 1 tablet (25 mg total) by mouth every 6 (six) hours as needed for nausea or vomiting. Patient not taking: Reported on 11/03/2017 10/31/17   Rolm Bookbinder, CNM    Family History Family History  Problem Relation Age of Onset  . Migraines Father     Social History Social History   Tobacco Use  . Smoking status: Never Smoker  . Smokeless tobacco: Never Used  Substance Use Topics  . Alcohol use: Yes    Comment: Wine  . Drug use: No     Allergies   Patient has no known allergies.   Review of Systems Review of Systems  Constitutional: Negative for activity change, chills and fever.  HENT: Negative for congestion.   Respiratory: Negative for shortness of breath.   Cardiovascular: Negative for chest pain.  Gastrointestinal: Negative for abdominal pain, nausea and vomiting.  Genitourinary: Negative for dysuria.  Musculoskeletal: Positive for myalgias. Negative for arthralgias, back pain, gait problem and joint swelling.  Skin: Negative for color change, rash and wound.  Allergic/Immunologic: Negative for immunocompromised state.  Neurological: Negative for dizziness, syncope, weakness, numbness and headaches.  Psychiatric/Behavioral: Negative for confusion.   Physical Exam Updated Vital Signs BP 100/67   Pulse 82   Temp 98.5 F (36.9 C) (Oral)   Resp 14   Ht  (1.575 m)   Wt 55.3 kg (122 lb)   LMP 08/08/2017   SpO2 100%    BMI 22.31 kg/m   Physical Exam  Constitutional: No distress.  HENT:  Head: Normocephalic.  Eyes: Conjunctivae are normal.  Neck: Neck supple.  Cardiovascular: Normal rate, regular rhythm, normal heart sounds and intact distal pulses. Exam reveals no gallop and no friction rub.  No murmur heard. Pulmonary/Chest: Effort normal and breath sounds normal. No stridor. No respiratory distress. She has no wheezes. She has no rales. She exhibits no tenderness.  Abdominal: Soft. Bowel sounds are normal. She exhibits no distension and no mass. There is no tenderness. There is no rebound and no guarding. No hernia.  Abdomen is soft, nondistended.  Mild tenderness to palpation to the bilateral lower quadrants without rebound or guarding.  Negative Murphy sign.  No CVA tenderness bilaterally.  Musculoskeletal: She exhibits tenderness. She exhibits no edema or deformity.  Diffusely tender to palpation to the bilateral lower legs.  Bilateral feet and right thigh are nontender to palpation she has mild tenderness to palpation of the left thigh.  No overlying erythema, edema, or warmth.  5 out of 5 strength against resistance of the bilateral lower extremities.  Sensation is intact throughout.  Good capillary refill of the bilateral lower extremities.  No tenderness to palpation to the spinous processes of the cervical, thoracic, or lumbar spine or surrounding bilateral paraspinal muscles.  Neurological: She is alert.  Skin: Skin is warm. No rash noted.  Psychiatric: Her behavior is normal.  Nursing note and vitals reviewed.    ED Treatments / Results  Labs (all labs ordered are listed, but only abnormal results are displayed) Labs Reviewed  COMPREHENSIVE METABOLIC PANEL - Abnormal; Notable for the following components:      Result Value   Potassium 3.2 (*)    Total Protein 8.2 (*)    Total Bilirubin 1.7 (*)    All other components within normal limits  CBC WITH DIFFERENTIAL/PLATELET - Abnormal;  Notable for the following components:   RBC 3.51 (*)    Hemoglobin 10.3 (*)    HCT 28.8 (*)    Platelets 123 (*)    Neutro Abs 8.0 (*)    All other components within normal limits  RETICULOCYTES - Abnormal; Notable for the following components:   RBC. 3.51 (*)    All other components within normal limits  HCG, QUANTITATIVE, PREGNANCY - Abnormal; Notable for the following components:   hCG, Beta Chain, Quant, S 327 (*)    All other components within normal limits    EKG None  Radiology No results found.  Procedures Procedures (including critical care time)  Medications Ordered in ED Medications  ondansetron (ZOFRAN) injection 4 mg (4 mg Intravenous Given 11/03/17 0735)  HYDROmorphone (DILAUDID) injection 1 mg (1 mg Intravenous Not Given 11/03/17 0915)    Or  HYDROmorphone (DILAUDID) injection 1 mg ( Subcutaneous See Alternative 11/03/17 0915)  dextrose 5 %-0.45 % sodium chloride infusion ( Intravenous New Bag/Given 11/03/17 0648)  ketorolac (TORADOL) 30 MG/ML injection 30 mg (30 mg Intravenous Given 11/03/17 0648)  HYDROmorphone (DILAUDID) injection 0.5 mg (0.5 mg Intravenous Given 11/03/17 0649)    Or  HYDROmorphone (DILAUDID) injection 0.5 mg ( Subcutaneous See Alternative 11/03/17 0649)  HYDROmorphone (DILAUDID) injection 1 mg (1 mg Intravenous Given 11/03/17 0735)    Or  HYDROmorphone (DILAUDID) injection 1 mg ( Subcutaneous See Alternative 11/03/17 0735)  HYDROmorphone (DILAUDID) injection 1 mg (1 mg Intravenous Given 11/03/17 0841)    Or  HYDROmorphone (DILAUDID) injection 1 mg ( Subcutaneous See Alternative 11/03/17 0841)  potassium chloride SA (K-DUR,KLOR-CON) CR tablet 40 mEq (40 mEq Oral Given 11/03/17 0658)  diphenhydrAMINE (BENADRYL) injection 25 mg (25 mg Intravenous Given 11/03/17 0849)     Initial Impression / Assessment and Plan / ED Course  I have reviewed the triage vital signs and the nursing notes.  Pertinent labs & imaging results that were available during my  care of the patient were reviewed by me and considered in my medical decision making (see chart for details).  Clinical Course as of Nov 03 925  Wed Nov 03, 2017  0800 Patient rechecked.  She reports that her pain is improved from a 10 to a 5 after 2 doses of Dilaudid.  K3.2.  Oral potassium chloride given.   [MM]    Clinical Course User Index [MM] Janathan Bribiesca A, PA-C  22 year old female with a history of HbSC presenting with bilateral lower leg pain.  She was recently treated for a missed abortion with Cytotec on April 30.  Ultrasound at [facility] on May 11 concerning for retained POC she was discharged with a second round of Cytotec and was advised to follow-up for repeat Hcg in one week.   She has no signs or symptoms concerning for acute chest.  Chest x-ray and EKG are not indicated at this time.  Labs are reassuring with uptrending hemoglobin 10.3 and hcg is 327.  Reticulocyte count is normal.  No leukocytosis.  Suspect sickle cell anemia with pain, but doubt sickle cell pain crisis or acute chest.  She is hemodynamically stable with no fever.  Mildly tachycardic in the 100s on arrival that improved with fluids.  Pain improved from 10/10 to 2/10 after treatment with Dilaudid and Toradol.  Will discharge the patient with Motrin and Tylenol.  Discussed with the patient and her mother that she should return to her PCPs office to have her hCG rechecked in 1 week.  Strict return precautions given.  She is hemodynamically stable and in no acute distress.  She is safe for discharge to home with outpatient follow-up at this time.  This chart was dictated using voice recognition software. Despite best efforts to proofread, errors can occur which can change the documentation meaning.  Final Clinical Impressions(s) / ED Diagnoses   Final diagnoses:  Sickle cell anemia with pain (HCC)  Hypokalemia    ED Discharge Orders        Ordered    acetaminophen (TYLENOL) 500 MG tablet   Every 8 hours PRN     11/03/17 0919    ibuprofen (ADVIL,MOTRIN) 800 MG tablet  3 times daily     11/03/17 0919    potassium chloride 20 MEQ TBCR  Daily     11/03/17 0923       Frederik Pear A, PA-C 11/03/17 1610    Geoffery Lyons, MD 11/04/17 [phone]

## 2017-11-03 NOTE — ED Triage Notes (Signed)
Pt complains of bilateral leg pain for two days, she was recently treated for a miscarriage at [facility]

## 2017-11-03 NOTE — Discharge Instructions (Addendum)
Thank you for allowing me to care for your today in the Emergency Department.   Please follow-up to have your hCG rechecked next week.  Your potassium was slightly low at 3.2 today.  You were given 1 dose today in the emergency department.  Starting tomorrow, take 1 tablet of potassium chloride daily for the next 3 days.  Take 800 mg of ibuprofen or 1000 mg of Tylenol every 8 hours.  Make sure to take ibuprofen with food so it does not upset your stomach.  If you develop new or worsening symptoms including severe, uncontrolled pain, chest pain, shortness of breath, a fever, or other new concerning symptoms, please return to the emergency department for re-evaluation.

## 2017-11-05 ENCOUNTER — Other Ambulatory Visit: Payer: Self-pay | Admitting: *Deleted

## 2017-11-05 DIAGNOSIS — O021 Missed abortion: Secondary | ICD-10-CM

## 2017-11-08 ENCOUNTER — Other Ambulatory Visit: Payer: Medicaid Other

## 2017-11-16 ENCOUNTER — Ambulatory Visit: Payer: Medicaid Other | Admitting: Obstetrics and Gynecology

## 2018-01-20 ENCOUNTER — Encounter: Payer: Self-pay | Admitting: Family Medicine

## 2018-01-20 ENCOUNTER — Ambulatory Visit (INDEPENDENT_AMBULATORY_CARE_PROVIDER_SITE_OTHER): Payer: Medicaid Other | Admitting: Family Medicine

## 2018-01-20 VITALS — BP 118/75 | HR 94 | Temp 99.0°F | Resp 14 | Ht 62.0 in | Wt 121.0 lb

## 2018-01-20 DIAGNOSIS — D571 Sickle-cell disease without crisis: Secondary | ICD-10-CM

## 2018-01-20 DIAGNOSIS — M545 Low back pain: Secondary | ICD-10-CM | POA: Diagnosis not present

## 2018-01-20 LAB — POCT URINALYSIS DIPSTICK
Bilirubin, UA: NEGATIVE
Blood, UA: NEGATIVE
Glucose, UA: NEGATIVE
Ketones, UA: NEGATIVE
Leukocytes, UA: NEGATIVE
Nitrite, UA: NEGATIVE
Protein, UA: NEGATIVE
Spec Grav, UA: 1.02 (ref 1.010–1.025)
Urobilinogen, UA: 1 E.U./dL
pH, UA: 6 (ref 5.0–8.0)

## 2018-01-20 MED ORDER — IBUPROFEN 800 MG PO TABS: 800.0000 mg | ORAL_TABLET | Freq: Three times a day (TID) | ORAL | 1 refills | Status: AC | PRN

## 2018-01-20 NOTE — Progress Notes (Signed)
Subjective   Makayla Fletcher 22 y.o. female  [id number]  [id number]  1995/07/06 22 y.o.     Chief Complaint  Patient presents with  . Back Pain    lower back pain     Patient presents for follow up on [facility] [place]. Patient states that her last crisis was 11/03/2017. Patient states that she has had low back pain that is unusual for her x 2 days.  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 States that she was told as a child that she may have scoliosis. Would like further evaluation.  Patient denies chest pain, SOB, dizziness, or leg swelling.  Denies Opthomologiy  appt in the past year. Has had problems with insurance.  Patient states that she had an abortion a few months ago. Had difficulty with retention of product. Patient has had a regular period since her last Gyn visit, but was concerned that her back pain may be coming from this. Denies dysuria, pelvic pain or vaginal discharge.   Past Medical History:  Diagnosis Date  . Depression   . Migraine without aura, without mention of intractable migraine without mention of status migrainosus   . Sickle cell anemia (HCC)   . Urinary tract infection July 2013   frequent     Social History   Socioeconomic History  . Marital status: Single    Spouse name: Not on file  . Number of children: 0  . Years of education: college  . Highest education level: Not on file  Occupational History  . Occupation: MINOR    Employer: UNEMPLOYED  . Occupation: Consulting civil engineertudent    Comment: Engineer, waterrising freshman at [employer]  . Occupation: MENTOR/TEACHER    Employer: [employer]  Social Needs  . Financial resource strain: Not on file  . Food insecurity:    Worry: Not on file    Inability: Not on file  . Transportation needs:    Medical: Not on file    Non-medical: Not on file  Tobacco Use  . Smoking status: Never Smoker  . Smokeless tobacco: Never Used  Substance and Sexual Activity  . Alcohol use: Yes    Comment: Wine  . Drug use: No  .  Sexual activity: Not Currently    Partners: Male    Birth control/protection: None    Comment: Depoprovera   Lifestyle  . Physical activity:    Days per week: Not on file    Minutes per session: Not on file  . Stress: Not on file  Relationships  . Social connections:    Talks on phone: Not on file    Gets together: Not on file    Attends religious service: Not on file    Active member of club or organization: Not on file    Attends meetings of clubs or organizations: Not on file    Relationship status: Not on file  . Intimate partner violence:    Fear of current or ex partner: Not on file    Emotionally abused: Not on file    Physically abused: Not on file    Forced sexual activity: Not on file  Other Topics Concern  . Not on file  Social History Narrative  . Not on file      Review of Systems  Musculoskeletal: Positive for back pain.  All other systems reviewed and are negative.   Objective   Physical Exam  Constitutional: She is oriented to person, place, and time. She appears well-developed and  well-nourished. No distress.  HENT:  Head: Normocephalic and atraumatic.  Eyes: Pupils are equal, round, and reactive to light. Conjunctivae and EOM are normal.  Neck: Normal range of motion. Neck supple. No JVD present. No thyromegaly present.  Cardiovascular: Normal rate, regular rhythm, normal heart sounds and intact distal pulses. Exam reveals no gallop and no friction rub.  No murmur heard. Pulmonary/Chest: Effort normal and breath sounds normal. No respiratory distress. She has no wheezes. She has no rales. She exhibits no tenderness.  Abdominal: Soft. Bowel sounds are normal. She exhibits no distension and no mass. There is no tenderness.  Musculoskeletal: Normal range of motion.       Thoracic back: She exhibits tenderness and spasm.       Lumbar back: She exhibits tenderness. She exhibits no spasm.       Back:  Lymphadenopathy:    She has no cervical adenopathy.    Neurological: She is alert and oriented to person, place, and time.  Skin: Skin is warm and dry.  Psychiatric: She has a normal mood and affect. Her behavior is normal. Judgment and thought content normal.  Nursing note and vitals reviewed.   BP 118/75 (BP Location: Left Arm, Patient Position: Sitting, Cuff Size: Normal)   Pulse 94   Temp 99 F (37.2 C) (Oral)   Resp 14   Ht 5\' 2"  (1.575 m)   Wt 121 lb (54.9 kg)   LMP 01/01/2018   SpO2 100%   BMI 22.13 kg/m   Assessment   Encounter Diagnoses  Name Primary?  . Acute midline low back pain, with sciatica presence unspecified Yes  . Hb-SS disease without crisis (HCC)      Plan  1. Acute midline low back pain, with sciatica presence unspecified Suspect muscle strain. Patient would like to try ibuprofen and if not relieved will have imaging. UA negative.  - Urinalysis Dipstick - DG Thoracic Spine W/Swimmers; Future - DG Lumbar Spine Complete; Future - ibuprofen (ADVIL,MOTRIN) 800 MG tablet; Take 1 tablet (800 mg total) by mouth every 8 (eight) hours as needed for moderate pain.  Dispense: 90 tablet; Refill: 1  2. Hb-SS disease without crisis [facility]) Continue with current medications. Labs ordered today.  - CBC With Differential - Comprehensive metabolic panel - VITAMIN D 25 Hydroxy (Vit-D Deficiency, Fractures)   Return to care as scheduled and prn. Patient verbalized understanding and agreed with plan of care.    Ms. Freda Jackson. Riley Lam, FNP-BC [facility] [address] [place], [place] [address] [phone]   This note has been created with Dragon speech recognition software and smart phrase technology. Any transcriptional errors are unintentional.

## 2018-01-20 NOTE — Patient Instructions (Signed)

## 2018-01-21 LAB — COMPREHENSIVE METABOLIC PANEL
ALT: 9 IU/L (ref 0–32)
AST: 16 IU/L (ref 0–40)
Albumin/Globulin Ratio: 1.4 (ref 1.2–2.2)
Albumin: 4.5 g/dL (ref 3.5–5.5)
Alkaline Phosphatase: 52 IU/L (ref 39–117)
BUN/Creatinine Ratio: 8 — ABNORMAL LOW (ref 9–23)
BUN: 5 mg/dL — ABNORMAL LOW (ref 6–20)
Bilirubin Total: 0.9 mg/dL (ref 0.0–1.2)
CO2: 23 mmol/L (ref 20–29)
Calcium: 9.1 mg/dL (ref 8.7–10.2)
Chloride: 102 mmol/L (ref 96–106)
Creatinine, Ser: 0.62 mg/dL (ref 0.57–1.00)
GFR calc Af Amer: 147 mL/min/{1.73_m2} (ref >59)
GFR calc non Af Amer: 128 mL/min/{1.73_m2} (ref >59)
Globulin, Total: 3.3 g/dL (ref 1.5–4.5)
Glucose: 100 mg/dL — ABNORMAL HIGH (ref 65–99)
Potassium: 3.8 mmol/L (ref 3.5–5.2)
Sodium: 141 mmol/L (ref 134–144)
Total Protein: 7.8 g/dL (ref 6.0–8.5)

## 2018-01-21 LAB — CBC WITH DIFFERENTIAL
Basophils Absolute: 0 10*3/uL (ref 0.0–0.2)
Basos: 0 %
EOS (ABSOLUTE): 0.1 10*3/uL (ref 0.0–0.4)
Eos: 1 %
Hematocrit: 32.2 % — ABNORMAL LOW (ref 34.0–46.6)
Hemoglobin: 10.8 g/dL — ABNORMAL LOW (ref 11.1–15.9)
Immature Grans (Abs): 0 10*3/uL (ref 0.0–0.1)
Immature Granulocytes: 0 %
Lymphocytes Absolute: 2.2 10*3/uL (ref 0.7–3.1)
Lymphs: 33 %
MCH: 28.8 pg (ref 26.6–33.0)
MCHC: 33.5 g/dL (ref 31.5–35.7)
MCV: 86 fL (ref 79–97)
Monocytes Absolute: 0.3 10*3/uL (ref 0.1–0.9)
Monocytes: 5 %
Neutrophils Absolute: 4.1 10*3/uL (ref 1.4–7.0)
Neutrophils: 61 %
RBC: 3.75 x10E6/uL — ABNORMAL LOW (ref 3.77–5.28)
RDW: 15.5 % — ABNORMAL HIGH (ref 12.3–15.4)
WBC: 6.8 10*3/uL (ref 3.4–10.8)

## 2018-01-21 LAB — VITAMIN D 25 HYDROXY (VIT D DEFICIENCY, FRACTURES): Vit D, 25-Hydroxy: 20.3 ng/mL — ABNORMAL LOW (ref 30.0–100.0)

## 2018-01-21 MED ORDER — CYCLOBENZAPRINE HCL 5 MG PO TABS: 5.0000 mg | ORAL_TABLET | Freq: Three times a day (TID) | ORAL | 1 refills | Status: DC | PRN

## 2018-04-15 ENCOUNTER — Inpatient Hospital Stay (HOSPITAL_COMMUNITY)
Admission: AD | Admit: 2018-04-15 | Discharge: 2018-04-15 | Disposition: A | Discharge status: Home / Self Care | Payer: Medicaid Other | Source: Ambulatory Visit | Attending: Obstetrics and Gynecology | Admitting: Obstetrics and Gynecology

## 2018-04-15 ENCOUNTER — Other Ambulatory Visit: Payer: Self-pay

## 2018-04-15 ENCOUNTER — Encounter (HOSPITAL_COMMUNITY): Payer: Self-pay | Admitting: *Deleted

## 2018-04-15 DIAGNOSIS — N912 Amenorrhea, unspecified: Secondary | ICD-10-CM | POA: Insufficient documentation

## 2018-04-15 NOTE — MAU Note (Addendum)
Had a miscarriage early this summer.  took a test about 3 was positive.  Just wanting to confirm.  Had some kind of twingy pains a couple days ago, none today. No bleeding.

## 2018-04-15 NOTE — MAU Provider Note (Signed)
[name] is a 22 y.o. G2P0020 at Unknown who presents to MAU today for pregnancy verification. She had 3 +HPT but is nervous because hx of SAB. The patient denies abdominal pain or vaginal bleeding today.   BP 116/73 (BP Location: Right Arm)   Pulse (!) 107   Temp 98.9 F (37.2 C) (Oral)   Resp 16   Wt 55.5 kg   LMP 02/21/2018   SpO2 100%   BMI 22.36 kg/m   CONSTITUTIONAL: Well-developed, well-nourished female in no acute distress.  MUSCULOSKELETAL: Normal range of motion.  CARDIOVASCULAR: Regular heart rate RESPIRATORY: Normal effort NEUROLOGICAL: Alert and oriented to person, place, and time.  SKIN: No pallor. PSYCH: Normal mood and affect. Normal behavior. Normal judgment and thought content.  No results found for this or any previous visit (from the past 24 hour(s)).  MDM Patient advised that without concerning symptoms today UPT and further evaluation is not indicated  A: Amenorrhea  P: Discharge home Pt advised that routine pregnancy tests are offered in the [facility] Monday- Friday 8:15-4:00, extended hours on Monday and Tuesday until 7:30 pm Pt plans to f/u at [facility] Patient may return to MAU as needed or if her condition were to change or worsen   Donette Larry, [place]  04/15/2018 6:16 PM

## 2018-05-03 ENCOUNTER — Encounter (HOSPITAL_COMMUNITY): Payer: Self-pay | Admitting: *Deleted

## 2018-05-03 ENCOUNTER — Inpatient Hospital Stay (HOSPITAL_COMMUNITY)
Admission: AD | Admit: 2018-05-03 | Discharge: 2018-05-03 | Disposition: A | Discharge status: Home / Self Care | Payer: Medicaid Other | Source: Ambulatory Visit | Attending: Internal Medicine | Admitting: Internal Medicine

## 2018-05-03 ENCOUNTER — Other Ambulatory Visit: Payer: Self-pay

## 2018-05-03 DIAGNOSIS — O219 Vomiting of pregnancy, unspecified: Secondary | ICD-10-CM

## 2018-05-03 DIAGNOSIS — Z3A10 10 weeks gestation of pregnancy: Secondary | ICD-10-CM | POA: Diagnosis not present

## 2018-05-03 DIAGNOSIS — O210 Mild hyperemesis gravidarum: Secondary | ICD-10-CM | POA: Diagnosis present

## 2018-05-03 LAB — URINALYSIS, ROUTINE W REFLEX MICROSCOPIC
BILIRUBIN URINE: NEGATIVE
GLUCOSE, UA: NEGATIVE mg/dL
HGB URINE DIPSTICK: NEGATIVE
Ketones, ur: 20 mg/dL — AB
Leukocytes, UA: NEGATIVE
Nitrite: NEGATIVE
PROTEIN: NEGATIVE mg/dL
SPECIFIC GRAVITY, URINE: 1.013 (ref 1.005–1.030)
pH: 7 (ref 5.0–8.0)

## 2018-05-03 LAB — POCT PREGNANCY, URINE: Preg Test, Ur: POSITIVE — AB

## 2018-05-03 MED ORDER — PROMETHAZINE HCL 12.5 MG PO TABS: 12.5000 mg | ORAL_TABLET | Freq: Four times a day (QID) | ORAL | 0 refills | Status: DC | PRN

## 2018-05-03 MED ORDER — LACTATED RINGERS IV BOLUS
1000.0000 mL | Freq: Once | INTRAVENOUS | Status: AC
  Administered 2018-05-03: 1000 mL via INTRAVENOUS

## 2018-05-03 MED ORDER — PROMETHAZINE HCL 25 MG/ML IJ SOLN
12.5000 mg | Freq: Once | INTRAMUSCULAR | Status: AC
  Administered 2018-05-03: 12.5 mg via INTRAVENOUS
  Filled 2018-05-03: qty 1

## 2018-05-03 NOTE — MAU Provider Note (Signed)
History    CSN: [id number] Arrival date and time: 05/03/18 1023  Chief Complaint  Patient presents with  . Emesis  . Possible Pregnancy   HPI Makayla Fletcher is a 22yo G3P0020 at [redacted]w[redacted]d who presents for nausea/vomiting and early pregnancy. Was seen about 2 weeks ago for confirmation of pregnancy but was asymptomatic at that time, so she has not had dating ultrasound or established prenatal care but has appointment tomorrow with [facility].   Worsening N/V for past two days. Hasn't kept any liquid or food in last 48 hours, tried this morning. Has tried soups, cereal and can't keep anything down. Has tried ginger popsicles. Hungry but scared to eat.   Denies abdominal pain, vaginal bleeding. Denies diarrhea, urinary symptoms. No sick contacts. Urinating but dark. Normal BM this morning.   OB History    Gravida  3   Para      Term      Preterm      AB  2   Living        SAB  2   TAB      Ectopic      Multiple      Live Births              Past Medical History:  Diagnosis Date  . Depression   . Migraine without aura, without mention of intractable migraine without mention of status migrainosus   . Sickle cell anemia (HCC)   . Urinary tract infection July 2013   frequent     Past Surgical History:  Procedure Laterality Date  . NO PAST SURGERIES      Family History  Problem Relation Age of Onset  . Migraines Father     Social History   Tobacco Use  . Smoking status: Never Smoker  . Smokeless tobacco: Never Used  Substance Use Topics  . Alcohol use: Yes    Comment: Wine  . Drug use: No    Allergies: No Known Allergies  Medications Prior to Admission  Medication Sig Dispense Refill Last Dose  . acetaminophen (TYLENOL) 500 MG tablet Take 2 tablets (1,000 mg total) by mouth every 8 (eight) hours as needed for moderate pain. 30 tablet 0 Taking  . folic acid (FOLVITE) 1 MG tablet TAKE 1 TABLET (1 MG TOTAL) BY MOUTH DAILY. (Patient taking differently: Take 1 mg  by mouth daily. ) 30 tablet 6 Taking    Review of Systems  Constitutional: Positive for fatigue. Negative for fever.  Respiratory: Negative for shortness of breath.   Cardiovascular: Negative for chest pain.  Gastrointestinal: Positive for nausea and vomiting. Negative for abdominal pain, constipation and diarrhea.  Genitourinary: Positive for decreased urine volume. Negative for dysuria, flank pain, pelvic pain and vaginal bleeding.  Neurological: Negative for dizziness, light-headedness and headaches.   Physical Exam   Blood pressure 100/64, pulse 94, temperature 97.7 F (36.5 C), resp. rate 16, weight 53.4 kg, last menstrual period 02/21/2018, unknown if currently breastfeeding.  Physical Exam  Nursing note and vitals reviewed. Constitutional: She is oriented to person, place, and time. She appears well-developed and well-nourished. No distress.  Looking at phone  HENT:  Head: Normocephalic and atraumatic.  Eyes: Conjunctivae and EOM are normal. No scleral icterus.  Neck: Neck supple. No thyromegaly present.  Cardiovascular: Normal rate, regular rhythm, normal heart sounds and intact distal pulses.  No murmur heard. Respiratory: Effort normal and breath sounds normal. She has no wheezes.  GI: Soft. Bowel sounds are normal. There is  no tenderness. There is no guarding.  Genitourinary:  Genitourinary Comments: deferred  Musculoskeletal: She exhibits no edema.  Neurological: She is alert and oriented to person, place, and time.  Skin: Skin is warm and dry. No rash noted.  Normal skin turgor   Psychiatric: She has a normal mood and affect. Her behavior is normal.   MAU Course  Procedures  MDM -- U/A with SG 1.013, 20 ketones  positive UPT - no tachycardia or evidence of impressive dehydration  -- will order IV bolus + phenergan IV  -- RN able to doppler FHTs -- symptoms improved, able to tolerate po gingerale and crackers, not feeling sleepy   Assessment and Plan  22yo  G3P0020 at 49107w1d who presented with nausea and vomiting of pregnancy which improved with IVF and phenergan. Discharged home with po phenergan, instructions to start Diclegis with prenatal care provider. Stable for discharge, all questions answered. Has first prenatal appointment tomorrow.   Makayla StandsLaurel S Samani Deal, DO  05/03/2018, 12:18 PM

## 2018-05-03 NOTE — MAU Note (Signed)
Ongoing vomiting.  Has not started care, has appt tomorrow. No pain or bleeding

## 2018-05-03 NOTE — Discharge Instructions (Signed)
Ask your prenatal provider about Diclegis prescription  Morning Sickness Morning sickness is when you feel sick to your stomach (nauseous) during pregnancy. You may feel sick to your stomach and throw up (vomit). You may feel sick in the morning, but you can feel this way any time of day. Some women feel very sick to their stomach and cannot stop throwing up (hyperemesis gravidarum). Follow these instructions at home:  Only take medicines as told by your doctor.  Take multivitamins as told by your doctor. Taking multivitamins before getting pregnant can stop or lessen the harshness of morning sickness.  Eat dry toast or unsalted crackers before getting out of bed.  Eat 5 to 6 small meals a day.  Eat dry and bland foods like rice and baked potatoes.  Do not drink liquids with meals. Drink between meals.  Do not eat greasy, fatty, or spicy foods.  Have someone cook for you if the smell of food causes you to feel sick or throw up.  If you feel sick to your stomach after taking prenatal vitamins, take them at night or with a snack.  Eat protein when you need a snack (nuts, yogurt, cheese).  Eat unsweetened gelatins for dessert.  Wear a bracelet used for sea sickness (acupressure wristband).  Go to a doctor that puts thin needles into certain body points (acupuncture) to improve how you feel.  Do not smoke.  Use a humidifier to keep the air in your house free of odors.  Get lots of fresh air. Contact a doctor if:  You need medicine to feel better.  You feel dizzy or lightheaded.  You are losing weight. Get help right away if:  You feel very sick to your stomach and cannot stop throwing up.  You pass out (faint). This information is not intended to replace advice given to you by your health care provider. Make sure you discuss any questions you have with your health care provider. Document Released: 07/16/2004 Document Revised: 11/14/2015 Document Reviewed:  11/23/2012 Elsevier Interactive Patient Education  2017 [employer].

## 2018-05-04 ENCOUNTER — Encounter: Payer: Self-pay | Admitting: Obstetrics and Gynecology

## 2018-05-04 ENCOUNTER — Ambulatory Visit (INDEPENDENT_AMBULATORY_CARE_PROVIDER_SITE_OTHER): Payer: Medicaid Other | Admitting: Obstetrics and Gynecology

## 2018-05-04 ENCOUNTER — Other Ambulatory Visit (HOSPITAL_COMMUNITY)
Admission: RE | Admit: 2018-05-04 | Discharge: 2018-05-04 | Disposition: A | Discharge status: Home / Self Care | Payer: Medicaid Other | Source: Ambulatory Visit | Attending: Obstetrics and Gynecology | Admitting: Obstetrics and Gynecology

## 2018-05-04 VITALS — BP 117/73 | HR 118 | Wt 119.0 lb

## 2018-05-04 DIAGNOSIS — D5700 Hb-SS disease with crisis, unspecified: Secondary | ICD-10-CM

## 2018-05-04 DIAGNOSIS — Z3481 Encounter for supervision of other normal pregnancy, first trimester: Secondary | ICD-10-CM

## 2018-05-04 DIAGNOSIS — Z3400 Encounter for supervision of normal first pregnancy, unspecified trimester: Secondary | ICD-10-CM | POA: Diagnosis not present

## 2018-05-04 NOTE — Patient Instructions (Signed)
 First Trimester of Pregnancy The first trimester of pregnancy is from week 1 until the end of week 13 (months 1 through 3). A week after a sperm fertilizes an egg, the egg will implant on the wall of the uterus. This embryo will begin to develop into a baby. Genes from you and your partner will form the baby. The female genes will determine whether the baby will be a boy or a girl. At 6-8 weeks, the eyes and face will be formed, and the heartbeat can be seen on ultrasound. At the end of 12 weeks, all the baby's organs will be formed. Now that you are pregnant, you will want to do everything you can to have a healthy baby. Two of the most important things are to get good prenatal care and to follow your health care provider's instructions. Prenatal care is all the medical care you receive before the baby's birth. This care will help prevent, find, and treat any problems during the pregnancy and childbirth. Body changes during your first trimester Your body goes through many changes during pregnancy. The changes vary from woman to woman.  You may gain or lose a couple of pounds at first.  You may feel sick to your stomach (nauseous) and you may throw up (vomit). If the vomiting is uncontrollable, call your health care provider.  You may tire easily.  You may develop headaches that can be relieved by medicines. All medicines should be approved by your health care provider.  You may urinate more often. Painful urination may mean you have a bladder infection.  You may develop heartburn as a result of your pregnancy.  You may develop constipation because certain hormones are causing the muscles that push stool through your intestines to slow down.  You may develop hemorrhoids or swollen veins (varicose veins).  Your breasts may begin to grow larger and become tender. Your nipples may stick out more, and the tissue that surrounds them (areola) may become darker.  Your gums may bleed and may be  sensitive to brushing and flossing.  Dark spots or blotches (chloasma, mask of pregnancy) may develop on your face. This will likely fade after the baby is born.  Your menstrual periods will stop.  You may have a loss of appetite.  You may develop cravings for certain kinds of food.  You may have changes in your emotions from day to day, such as being excited to be pregnant or being concerned that something may go wrong with the pregnancy and baby.  You may have more vivid and strange dreams.  You may have changes in your hair. These can include thickening of your hair, rapid growth, and changes in texture. Some women also have hair loss during or after pregnancy, or hair that feels dry or thin. Your hair will most likely return to normal after your baby is born.  What to expect at prenatal visits During a routine prenatal visit:  You will be weighed to make sure you and the baby are growing normally.  Your blood pressure will be taken.  Your abdomen will be measured to track your baby's growth.  The fetal heartbeat will be listened to between weeks 10 and 14 of your pregnancy.  Test results from any previous visits will be discussed.  Your health care provider may ask you:  How you are feeling.  If you are feeling the baby move.  If you have had any abnormal symptoms, such as leaking fluid, bleeding, severe   headaches, or abdominal cramping.  If you are using any tobacco products, including cigarettes, chewing tobacco, and electronic cigarettes.  If you have any questions.  Other tests that may be performed during your first trimester include:  Blood tests to find your blood type and to check for the presence of any previous infections. The tests will also be used to check for low iron levels (anemia) and protein on red blood cells (Rh antibodies). Depending on your risk factors, or if you previously had diabetes during pregnancy, you may have tests to check for high blood  sugar that affects pregnant women (gestational diabetes).  Urine tests to check for infections, diabetes, or protein in the urine.  An ultrasound to confirm the proper growth and development of the baby.  Fetal screens for spinal cord problems (spina bifida) and Down syndrome.  HIV (human immunodeficiency virus) testing. Routine prenatal testing includes screening for HIV, unless you choose not to have this test.  You may need other tests to make sure you and the baby are doing well.  Follow these instructions at home: Medicines  Follow your health care provider's instructions regarding medicine use. Specific medicines may be either safe or unsafe to take during pregnancy.  Take a prenatal vitamin that contains at least 600 micrograms (mcg) of folic acid.  If you develop constipation, try taking a stool softener if your health care provider approves. Eating and drinking  Eat a balanced diet that includes fresh fruits and vegetables, whole grains, good sources of protein such as meat, eggs, or tofu, and low-fat dairy. Your health care provider will help you determine the amount of weight gain that is right for you.  Avoid raw meat and uncooked cheese. These carry germs that can cause birth defects in the baby.  Eating four or five small meals rather than three large meals a day may help relieve nausea and vomiting. If you start to feel nauseous, eating a few soda crackers can be helpful. Drinking liquids between meals, instead of during meals, also seems to help ease nausea and vomiting.  Limit foods that are high in fat and processed sugars, such as fried and sweet foods.  To prevent constipation: ? Eat foods that are high in fiber, such as fresh fruits and vegetables, whole grains, and beans. ? Drink enough fluid to keep your urine clear or pale yellow. Activity  Exercise only as directed by your health care provider. Most women can continue their usual exercise routine during  pregnancy. Try to exercise for 30 minutes at least 5 days a week. Exercising will help you: ? Control your weight. ? Stay in shape. ? Be prepared for labor and delivery.  Experiencing pain or cramping in the lower abdomen or lower back is a good sign that you should stop exercising. Check with your health care provider before continuing with normal exercises.  Try to avoid standing for long periods of time. Move your legs often if you must stand in one place for a long time.  Avoid heavy lifting.  Wear low-heeled shoes and practice good posture.  You may continue to have sex unless your health care provider tells you not to. Relieving pain and discomfort  Wear a good support bra to relieve breast tenderness.  Take warm sitz baths to soothe any pain or discomfort caused by hemorrhoids. Use hemorrhoid cream if your health care provider approves.  Rest with your legs elevated if you have leg cramps or low back pain.  If you   develop varicose veins in your legs, wear support hose. Elevate your feet for 15 minutes, 3-4 times a day. Limit salt in your diet. Prenatal care  Schedule your prenatal visits by the twelfth week of pregnancy. They are usually scheduled monthly at first, then more often in the last 2 months before delivery.  Write down your questions. Take them to your prenatal visits.  Keep all your prenatal visits as told by your health care provider. This is important. Safety  Wear your seat belt at all times when driving.  Make a list of emergency phone numbers, including numbers for family, friends, the hospital, and police and fire departments. General instructions  Ask your health care provider for a referral to a local prenatal education class. Begin classes no later than the beginning of month 6 of your pregnancy.  Ask for help if you have counseling or nutritional needs during pregnancy. Your health care provider can offer advice or refer you to specialists for help  with various needs.  Do not use hot tubs, steam rooms, or saunas.  Do not douche or use tampons or scented sanitary pads.  Do not cross your legs for long periods of time.  Avoid cat litter boxes and soil used by cats. These carry germs that can cause birth defects in the baby and possibly loss of the fetus by miscarriage or stillbirth.  Avoid all smoking, herbs, alcohol, and medicines not prescribed by your health care provider. Chemicals in these products affect the formation and growth of the baby.  Do not use any products that contain nicotine or tobacco, such as cigarettes and e-cigarettes. If you need help quitting, ask your health care provider. You may receive counseling support and other resources to help you quit.  Schedule a dentist appointment. At home, brush your teeth with a soft toothbrush and be gentle when you floss. Contact a health care provider if:  You have dizziness.  You have mild pelvic cramps, pelvic pressure, or nagging pain in the abdominal area.  You have persistent nausea, vomiting, or diarrhea.  You have a bad smelling vaginal discharge.  You have pain when you urinate.  You notice increased swelling in your face, hands, legs, or ankles.  You are exposed to fifth disease or chickenpox.  You are exposed to German measles (rubella) and have never had it. Get help right away if:  You have a fever.  You are leaking fluid from your vagina.  You have spotting or bleeding from your vagina.  You have severe abdominal cramping or pain.  You have rapid weight gain or loss.  You vomit blood or material that looks like coffee grounds.  You develop a severe headache.  You have shortness of breath.  You have any kind of trauma, such as from a fall or a car accident. Summary  The first trimester of pregnancy is from week 1 until the end of week 13 (months 1 through 3).  Your body goes through many changes during pregnancy. The changes vary from  woman to woman.  You will have routine prenatal visits. During those visits, your health care provider will examine you, discuss any test results you may have, and talk with you about how you are feeling. This information is not intended to replace advice given to you by your health care provider. Make sure you discuss any questions you have with your health care provider. Document Released: 06/02/2001 Document Revised: 05/20/2016 Document Reviewed: 05/20/2016 [employer] Interactive Patient Education  2018   [employer]   Second Trimester of Pregnancy The second trimester is from week 14 through week 27 (months 4 through 6). The second trimester is often a time when you feel your best. Your body has adjusted to being pregnant, and you begin to feel better physically. Usually, morning sickness has lessened or quit completely, you may have more energy, and you may have an increase in appetite. The second trimester is also a time when the fetus is growing rapidly. At the end of the sixth month, the fetus is about 9 inches long and weighs about 1 pounds. You will likely begin to feel the baby move (quickening) between 16 and 20 weeks of pregnancy. Body changes during your second trimester Your body continues to go through many changes during your second trimester. The changes vary from woman to woman.  Your weight will continue to increase. You will notice your lower abdomen bulging out.  You may begin to get stretch marks on your hips, abdomen, and breasts.  You may develop headaches that can be relieved by medicines. The medicines should be approved by your health care provider.  You may urinate more often because the fetus is pressing on your bladder.  You may develop or continue to have heartburn as a result of your pregnancy.  You may develop constipation because certain hormones are causing the muscles that push waste through your intestines to slow down.  You may develop hemorrhoids or  swollen, bulging veins (varicose veins).  You may have back pain. This is caused by: ? Weight gain. ? Pregnancy hormones that are relaxing the joints in your pelvis. ? A shift in weight and the muscles that support your balance.  Your breasts will continue to grow and they will continue to become tender.  Your gums may bleed and may be sensitive to brushing and flossing.  Dark spots or blotches (chloasma, mask of pregnancy) may develop on your face. This will likely fade after the baby is born.  A dark line from your belly button to the pubic area (linea nigra) may appear. This will likely fade after the baby is born.  You may have changes in your hair. These can include thickening of your hair, rapid growth, and changes in texture. Some women also have hair loss during or after pregnancy, or hair that feels dry or thin. Your hair will most likely return to normal after your baby is born.  What to expect at prenatal visits During a routine prenatal visit:  You will be weighed to make sure you and the fetus are growing normally.  Your blood pressure will be taken.  Your abdomen will be measured to track your baby's growth.  The fetal heartbeat will be listened to.  Any test results from the previous visit will be discussed.  Your health care provider may ask you:  How you are feeling.  If you are feeling the baby move.  If you have had any abnormal symptoms, such as leaking fluid, bleeding, severe headaches, or abdominal cramping.  If you are using any tobacco products, including cigarettes, chewing tobacco, and electronic cigarettes.  If you have any questions.  Other tests that may be performed during your second trimester include:  Blood tests that check for: ? Low iron levels (anemia). ? High blood sugar that affects pregnant women (gestational diabetes) between 24 and 28 weeks. ? Rh antibodies. This is to check for a protein on red blood cells (Rh factor).  Urine  tests to   check for infections, diabetes, or protein in the urine.  An ultrasound to confirm the proper growth and development of the baby.  An amniocentesis to check for possible genetic problems.  Fetal screens for spina bifida and Down syndrome.  HIV (human immunodeficiency virus) testing. Routine prenatal testing includes screening for HIV, unless you choose not to have this test.  Follow these instructions at home: Medicines  Follow your health care provider's instructions regarding medicine use. Specific medicines may be either safe or unsafe to take during pregnancy.  Take a prenatal vitamin that contains at least 600 micrograms (mcg) of folic acid.  If you develop constipation, try taking a stool softener if your health care provider approves. Eating and drinking  Eat a balanced diet that includes fresh fruits and vegetables, whole grains, good sources of protein such as meat, eggs, or tofu, and low-fat dairy. Your health care provider will help you determine the amount of weight gain that is right for you.  Avoid raw meat and uncooked cheese. These carry germs that can cause birth defects in the baby.  If you have low calcium intake from food, talk to your health care provider about whether you should take a daily calcium supplement.  Limit foods that are high in fat and processed sugars, such as fried and sweet foods.  To prevent constipation: ? Drink enough fluid to keep your urine clear or pale yellow. ? Eat foods that are high in fiber, such as fresh fruits and vegetables, whole grains, and beans. Activity  Exercise only as directed by your health care provider. Most women can continue their usual exercise routine during pregnancy. Try to exercise for 30 minutes at least 5 days a week. Stop exercising if you experience uterine contractions.  Avoid heavy lifting, wear low heel shoes, and practice good posture.  A sexual relationship may be continued unless your health  care provider directs you otherwise. Relieving pain and discomfort  Wear a good support bra to prevent discomfort from breast tenderness.  Take warm sitz baths to soothe any pain or discomfort caused by hemorrhoids. Use hemorrhoid cream if your health care provider approves.  Rest with your legs elevated if you have leg cramps or low back pain.  If you develop varicose veins, wear support hose. Elevate your feet for 15 minutes, 3-4 times a day. Limit salt in your diet. Prenatal Care  Write down your questions. Take them to your prenatal visits.  Keep all your prenatal visits as told by your health care provider. This is important. Safety  Wear your seat belt at all times when driving.  Make a list of emergency phone numbers, including numbers for family, friends, the hospital, and police and fire departments. General instructions  Ask your health care provider for a referral to a local prenatal education class. Begin classes no later than the beginning of month 6 of your pregnancy.  Ask for help if you have counseling or nutritional needs during pregnancy. Your health care provider can offer advice or refer you to specialists for help with various needs.  Do not use hot tubs, steam rooms, or saunas.  Do not douche or use tampons or scented sanitary pads.  Do not cross your legs for long periods of time.  Avoid cat litter boxes and soil used by cats. These carry germs that can cause birth defects in the baby and possibly loss of the fetus by miscarriage or stillbirth.  Avoid all smoking, herbs, alcohol, and unprescribed drugs. Chemicals   in these products can affect the formation and growth of the baby.  Do not use any products that contain nicotine or tobacco, such as cigarettes and e-cigarettes. If you need help quitting, ask your health care provider.  Visit your dentist if you have not gone yet during your pregnancy. Use a soft toothbrush to brush your teeth and be gentle when  you floss. Contact a health care provider if:  You have dizziness.  You have mild pelvic cramps, pelvic pressure, or nagging pain in the abdominal area.  You have persistent nausea, vomiting, or diarrhea.  You have a bad smelling vaginal discharge.  You have pain when you urinate. Get help right away if:  You have a fever.  You are leaking fluid from your vagina.  You have spotting or bleeding from your vagina.  You have severe abdominal cramping or pain.  You have rapid weight gain or weight loss.  You have shortness of breath with chest pain.  You notice sudden or extreme swelling of your face, hands, ankles, feet, or legs.  You have not felt your baby move in over an hour.  You have severe headaches that do not go away when you take medicine.  You have vision changes. Summary  The second trimester is from week 14 through week 27 (months 4 through 6). It is also a time when the fetus is growing rapidly.  Your body goes through many changes during pregnancy. The changes vary from woman to woman.  Avoid all smoking, herbs, alcohol, and unprescribed drugs. These chemicals affect the formation and growth your baby.  Do not use any tobacco products, such as cigarettes, chewing tobacco, and e-cigarettes. If you need help quitting, ask your health care provider.  Contact your health care provider if you have any questions. Keep all prenatal visits as told by your health care provider. This is important. This information is not intended to replace advice given to you by your health care provider. Make sure you discuss any questions you have with your health care provider. Document Released: 06/02/2001 Document Revised: 07/14/2016 Document Reviewed: 07/14/2016 [employer] Interactive Patient Education  2018 [employer]   Contraception Choices Contraception, also called birth control, refers to methods or devices that prevent pregnancy. Hormonal methods Contraceptive  implant A contraceptive implant is a thin, plastic tube that contains a hormone. It is inserted into the upper part of the arm. It can remain in place for up to 3 years. Progestin-only injections Progestin-only injections are injections of progestin, a synthetic form of the hormone progesterone. They are given every 3 months by a health care provider. Birth control pills Birth control pills are pills that contain hormones that prevent pregnancy. They must be taken once a day, preferably at the same time each day. Birth control patch The birth control patch contains hormones that prevent pregnancy. It is placed on the skin and must be changed once a week for three weeks and removed on the fourth week. A prescription is needed to use this method of contraception. Vaginal ring A vaginal ring contains hormones that prevent pregnancy. It is placed in the vagina for three weeks and removed on the fourth week. After that, the process is repeated with a new ring. A prescription is needed to use this method of contraception. Emergency contraceptive Emergency contraceptives prevent pregnancy after unprotected sex. They come in pill form and can be taken up to 5 days after sex. They work best the sooner they are taken after having   sex. Most emergency contraceptives are available without a prescription. This method should not be used as your only form of birth control. Barrier methods Female condom A female condom is a thin sheath that is worn over the penis during sex. Condoms keep sperm from going inside a woman's body. They can be used with a spermicide to increase their effectiveness. They should be disposed after a single use. Female condom A female condom is a soft, loose-fitting sheath that is put into the vagina before sex. The condom keeps sperm from going inside a woman's body. They should be disposed after a single use. Diaphragm A diaphragm is a soft, dome-shaped barrier. It is inserted into the vagina  before sex, along with a spermicide. The diaphragm blocks sperm from entering the uterus, and the spermicide kills sperm. A diaphragm should be left in the vagina for 6-8 hours after sex and removed within 24 hours. A diaphragm is prescribed and fitted by a health care provider. A diaphragm should be replaced every 1-2 years, after giving birth, after gaining more than 15 lb (6.8 kg), and after pelvic surgery. Cervical cap A cervical cap is a round, soft latex or plastic cup that fits over the cervix. It is inserted into the vagina before sex, along with spermicide. It blocks sperm from entering the uterus. The cap should be left in place for 6-8 hours after sex and removed within 48 hours. A cervical cap must be prescribed and fitted by a health care provider. It should be replaced every 2 years. Sponge A sponge is a soft, circular piece of polyurethane foam with spermicide on it. The sponge helps block sperm from entering the uterus, and the spermicide kills sperm. To use it, you make it wet and then insert it into the vagina. It should be inserted before sex, left in for at least 6 hours after sex, and removed and thrown away within 30 hours. Spermicides Spermicides are chemicals that kill or block sperm from entering the cervix and uterus. They can come as a cream, jelly, suppository, foam, or tablet. A spermicide should be inserted into the vagina with an applicator at least 10-15 minutes before sex to allow time for it to work. The process must be repeated every time you have sex. Spermicides do not require a prescription. Intrauterine contraception Intrauterine device (IUD) An IUD is a T-shaped device that is put in a woman's uterus. There are two types:  Hormone IUD.This type contains progestin, a synthetic form of the hormone progesterone. This type can stay in place for 3-5 years.  Copper IUD.This type is wrapped in copper wire. It can stay in place for 10 years.  Permanent methods of  contraception Female tubal ligation In this method, a woman's fallopian tubes are sealed, tied, or blocked during surgery to prevent eggs from traveling to the uterus. Hysteroscopic sterilization In this method, a small, flexible insert is placed into each fallopian tube. The inserts cause scar tissue to form in the fallopian tubes and block them, so sperm cannot reach an egg. The procedure takes about 3 months to be effective. Another form of birth control must be used during those 3 months. Female sterilization This is a procedure to tie off the tubes that carry sperm (vasectomy). After the procedure, the man can still ejaculate fluid (semen). Natural planning methods Natural family planning In this method, a couple does not have sex on days when the woman could become pregnant. Calendar method This means keeping track of the   length of each menstrual cycle, identifying the days when pregnancy can happen, and not having sex on those days. Ovulation method In this method, a couple avoids sex during ovulation. Symptothermal method This method involves not having sex during ovulation. The woman typically checks for ovulation by watching changes in her temperature and in the consistency of cervical mucus. Post-ovulation method In this method, a couple waits to have sex until after ovulation. Summary  Contraception, also called birth control, means methods or devices that prevent pregnancy.  Hormonal methods of contraception include implants, injections, pills, patches, vaginal rings, and emergency contraceptives.  Barrier methods of contraception can include female condoms, female condoms, diaphragms, cervical caps, sponges, and spermicides.  There are two types of IUDs (intrauterine devices). An IUD can be put in a woman's uterus to prevent pregnancy for 3-5 years.  Permanent sterilization can be done through a procedure for males, females, or both.  Natural family planning methods involve  not having sex on days when the woman could become pregnant. This information is not intended to replace advice given to you by your health care provider. Make sure you discuss any questions you have with your health care provider. Document Released: 06/08/2005 Document Revised: 07/11/2016 Document Reviewed: 07/11/2016 Elsevier Interactive Patient Education  2018 Else[employer] Inc.   Breastfeeding Choosing to breastfeed is one of the best decisions you can make for yourself and your baby. A change in hormones during pregnancy causes your breasts to make breast milk in your milk-producing glands. Hormones prevent breast milk from being released before your baby is born. They also prompt milk flow after birth. Once breastfeeding has begun, thoughts of your baby, as well as his or her sucking or crying, can stimulate the release of milk from your milk-producing glands. Benefits of breastfeeding Research shows that breastfeeding offers many health benefits for infants and mothers. It also offers a cost-free and convenient way to feed your baby. For your baby  Your first milk (colostrum) helps your baby's digestive system to function better.  Special cells in your milk (antibodies) help your baby to fight off infections.  Breastfed babies are less likely to develop asthma, allergies, obesity, or type 2 diabetes. They are also at lower risk for sudden infant death syndrome (SIDS).  Nutrients in breast milk are better able to meet your baby's needs compared to infant formula.  Breast milk improves your baby's brain development. For you  Breastfeeding helps to create a very special bond between you and your baby.  Breastfeeding is convenient. Breast milk costs nothing and is always available at the correct temperature.  Breastfeeding helps to burn calories. It helps you to lose the weight that you gained during pregnancy.  Breastfeeding makes your uterus return faster to its size before pregnancy.  It also slows bleeding (lochia) after you give birth.  Breastfeeding helps to lower your risk of developing type 2 diabetes, osteoporosis, rheumatoid arthritis, cardiovascular disease, and breast, ovarian, uterine, and endometrial cancer later in life. Breastfeeding basics Starting breastfeeding  Find a comfortable place to sit or lie down, with your neck and back well-supported.  Place a pillow or a rolled-up blanket under your baby to bring him or her to the level of your breast (if you are seated). Nursing pillows are specially designed to help support your arms and your baby while you breastfeed.  Make sure that your baby's tummy (abdomen) is facing your abdomen.  Gently massage your breast. With your fingertips, massage from the outer edges of   your breast inward toward the nipple. This encourages milk flow. If your milk flows slowly, you may need to continue this action during the feeding.  Support your breast with 4 fingers underneath and your thumb above your nipple (make the letter "C" with your hand). Make sure your fingers are well away from your nipple and your baby's mouth.  Stroke your baby's lips gently with your finger or nipple.  When your baby's mouth is open wide enough, quickly bring your baby to your breast, placing your entire nipple and as much of the areola as possible into your baby's mouth. The areola is the colored area around your nipple. ? More areola should be visible above your baby's upper lip than below the lower lip. ? Your baby's lips should be opened and extended outward (flanged) to ensure an adequate, comfortable latch. ? Your baby's tongue should be between his or her lower gum and your breast.  Make sure that your baby's mouth is correctly positioned around your nipple (latched). Your baby's lips should create a seal on your breast and be turned out (everted).  It is common for your baby to suck about 2-3 minutes in order to start the flow of breast  milk. Latching Teaching your baby how to latch onto your breast properly is very important. An improper latch can cause nipple pain, decreased milk supply, and poor weight gain in your baby. Also, if your baby is not latched onto your nipple properly, he or she may swallow some air during feeding. This can make your baby fussy. Burping your baby when you switch breasts during the feeding can help to get rid of the air. However, teaching your baby to latch on properly is still the best way to prevent fussiness from swallowing air while breastfeeding. Signs that your baby has successfully latched onto your nipple  Silent tugging or silent sucking, without causing you pain. Infant's lips should be extended outward (flanged).  Swallowing heard between every 3-4 sucks once your milk has started to flow (after your let-down milk reflex occurs).  Muscle movement above and in front of his or her ears while sucking.  Signs that your baby has not successfully latched onto your nipple  Sucking sounds or smacking sounds from your baby while breastfeeding.  Nipple pain.  If you think your baby has not latched on correctly, slip your finger into the corner of your baby's mouth to break the suction and place it between your baby's gums. Attempt to start breastfeeding again. Signs of successful breastfeeding Signs from your baby  Your baby will gradually decrease the number of sucks or will completely stop sucking.  Your baby will fall asleep.  Your baby's body will relax.  Your baby will retain a small amount of milk in his or her mouth.  Your baby will let go of your breast by himself or herself.  Signs from you  Breasts that have increased in firmness, weight, and size 1-3 hours after feeding.  Breasts that are softer immediately after breastfeeding.  Increased milk volume, as well as a change in milk consistency and color by the fifth day of breastfeeding.  Nipples that are not sore,  cracked, or bleeding.  Signs that your baby is getting enough milk  Wetting at least 1-2 diapers during the first 24 hours after birth.  Wetting at least 5-6 diapers every 24 hours for the first week after birth. The urine should be clear or pale yellow by the age of 5   days.  Wetting 6-8 diapers every 24 hours as your baby continues to grow and develop.  At least 3 stools in a 24-hour period by the age of 5 days. The stool should be soft and yellow.  At least 3 stools in a 24-hour period by the age of 7 days. The stool should be seedy and yellow.  No loss of weight greater than 10% of birth weight during the first 3 days of life.  Average weight gain of 4-7 oz (113-198 g) per week after the age of 4 days.  Consistent daily weight gain by the age of 5 days, without weight loss after the age of 2 weeks. After a feeding, your baby may spit up a small amount of milk. This is normal. Breastfeeding frequency and duration Frequent feeding will help you make more milk and can prevent sore nipples and extremely full breasts (breast engorgement). Breastfeed when you feel the need to reduce the fullness of your breasts or when your baby shows signs of hunger. This is called "breastfeeding on demand." Signs that your baby is hungry include:  Increased alertness, activity, or restlessness.  Movement of the head from side to side.  Opening of the mouth when the corner of the mouth or cheek is stroked (rooting).  Increased sucking sounds, smacking lips, cooing, sighing, or squeaking.  Hand-to-mouth movements and sucking on fingers or hands.  Fussing or crying.  Avoid introducing a pacifier to your baby in the first 4-6 weeks after your baby is born. After this time, you may choose to use a pacifier. Research has shown that pacifier use during the first year of a baby's life decreases the risk of sudden infant death syndrome (SIDS). Allow your baby to feed on each breast as long as he or she  wants. When your baby unlatches or falls asleep while feeding from the first breast, offer the second breast. Because newborns are often sleepy in the first few weeks of life, you may need to awaken your baby to get him or her to feed. Breastfeeding times will vary from baby to baby. However, the following rules can serve as a guide to help you make sure that your baby is properly fed:  Newborns (babies 4 weeks of age or younger) may breastfeed every 1-3 hours.  Newborns should not go without breastfeeding for longer than 3 hours during the day or 5 hours during the night.  You should breastfeed your baby a minimum of 8 times in a 24-hour period.  Breast milk pumping Pumping and storing breast milk allows you to make sure that your baby is exclusively fed your breast milk, even at times when you are unable to breastfeed. This is especially important if you go back to work while you are still breastfeeding, or if you are not able to be present during feedings. Your lactation consultant can help you find a method of pumping that works best for you and give you guidelines about how long it is safe to store breast milk. Caring for your breasts while you breastfeed Nipples can become dry, cracked, and sore while breastfeeding. The following recommendations can help keep your breasts moisturized and healthy:  Avoid using soap on your nipples.  Wear a supportive bra designed especially for nursing. Avoid wearing underwire-style bras or extremely tight bras (sports bras).  Air-dry your nipples for 3-4 minutes after each feeding.  Use only cotton bra pads to absorb leaked breast milk. Leaking of breast milk between feedings is normal.    Use lanolin on your nipples after breastfeeding. Lanolin helps to maintain your skin's normal moisture barrier. Pure lanolin is not harmful (not toxic) to your baby. You may also hand express a few drops of breast milk and gently massage that milk into your nipples and  allow the milk to air-dry.  In the first few weeks after giving birth, some women experience breast engorgement. Engorgement can make your breasts feel heavy, warm, and tender to the touch. Engorgement peaks within 3-5 days after you give birth. The following recommendations can help to ease engorgement:  Completely empty your breasts while breastfeeding or pumping. You may want to start by applying warm, moist heat (in the shower or with warm, water-soaked hand towels) just before feeding or pumping. This increases circulation and helps the milk flow. If your baby does not completely empty your breasts while breastfeeding, pump any extra milk after he or she is finished.  Apply ice packs to your breasts immediately after breastfeeding or pumping, unless this is too uncomfortable for you. To do this: ? Put ice in a plastic bag. ? Place a towel between your skin and the bag. ? Leave the ice on for 20 minutes, 2-3 times a day.  Make sure that your baby is latched on and positioned properly while breastfeeding.  If engorgement persists after 48 hours of following these recommendations, contact your health care provider or a lactation consultant. Overall health care recommendations while breastfeeding  Eat 3 healthy meals and 3 snacks every day. Well-nourished mothers who are breastfeeding need an additional 450-500 calories a day. You can meet this requirement by increasing the amount of a balanced diet that you eat.  Drink enough water to keep your urine pale yellow or clear.  Rest often, relax, and continue to take your prenatal vitamins to prevent fatigue, stress, and low vitamin and mineral levels in your body (nutrient deficiencies).  Do not use any products that contain nicotine or tobacco, such as cigarettes and e-cigarettes. Your baby may be harmed by chemicals from cigarettes that pass into breast milk and exposure to secondhand smoke. If you need help quitting, ask your health care  provider.  Avoid alcohol.  Do not use illegal drugs or marijuana.  Talk with your health care provider before taking any medicines. These include over-the-counter and prescription medicines as well as vitamins and herbal supplements. Some medicines that may be harmful to your baby can pass through breast milk.  It is possible to become pregnant while breastfeeding. If birth control is desired, ask your health care provider about options that will be safe while breastfeeding your baby. Where to find more information: [employer]: www.llli.org Contact a health care provider if:  You feel like you want to stop breastfeeding or have become frustrated with breastfeeding.  Your nipples are cracked or bleeding.  Your breasts are red, tender, or warm.  You have: ? Painful breasts or nipples. ? A swollen area on either breast. ? A fever or chills. ? Nausea or vomiting. ? Drainage other than breast milk from your nipples.  Your breasts do not become full before feedings by the fifth day after you give birth.  You feel sad and depressed.  Your baby is: ? Too sleepy to eat well. ? Having trouble sleeping. ? More than 1 week old and wetting fewer than 6 diapers in a 24-hour period. ? Not gaining weight by 5 days of age.  Your baby has fewer than 3 stools in a   24-hour period.  Your baby's skin or the white parts of his or her eyes become yellow. Get help right away if:  Your baby is overly tired (lethargic) and does not want to wake up and feed.  Your baby develops an unexplained fever. Summary  Breastfeeding offers many health benefits for infant and mothers.  Try to breastfeed your infant when he or she shows early signs of hunger.  Gently tickle or stroke your baby's lips with your finger or nipple to allow the baby to open his or her mouth. Bring the baby to your breast. Make sure that much of the areola is in your baby's mouth. Offer one side and burp the  baby before you offer the other side.  Talk with your health care provider or lactation consultant if you have questions or you face problems as you breastfeed. This information is not intended to replace advice given to you by your health care provider. Make sure you discuss any questions you have with your health care provider. Document Released: 06/08/2005 Document Revised: 07/10/2016 Document Reviewed: 07/10/2016 Elsevier Interactive Patient Education  2018 Elsevier Inc.  

## 2018-05-04 NOTE — Progress Notes (Signed)
Subjective:    Makayla BEHRENDT is a [id number] [redacted]w[redacted]d being seen today for her first obstetrical visit.  Her obstetrical history is significant for first pregnancy and sickle cell anemia. Patient states that she has not had a cisis in several years and is scheduled to see MD in February. Patient does intend to breast feed. Pregnancy history fully reviewed.  Patient reports nausea.  Vitals:   05/04/18 0838  BP: 117/73  Pulse: (!) 118  Weight: 119 lb (54 kg)    HISTORY: OB History  Gravida Para Term Preterm AB Living  3       2    SAB TAB Ectopic Multiple Live Births  2            # Outcome Date GA Lbr Len/2nd Weight Sex Delivery Anes PTL Lv  3 Current           2 SAB 10/11/17 [redacted]w[redacted]d         1 SAB 07/02/17           Past Medical History:  Diagnosis Date  . Depression   . Migraine without aura, without mention of intractable migraine without mention of status migrainosus   . Sickle cell anemia (HCC)   . Urinary tract infection July 2013   frequent    Past Surgical History:  Procedure Laterality Date  . NO PAST SURGERIES     Family History  Problem Relation Age of Onset  . Migraines Father      Exam    Uterus:   10-week size uterus  Pelvic Exam:    Perineum: No Hemorrhoids, Normal Perineum   Vulva: normal   Vagina:  normal mucosa, normal discharge   pH:    Cervix: nulliparous appearance cervix is closed and long   Adnexa: normal adnexa and no mass, fullness, tenderness   Bony Pelvis: gynecoid  System: Breast:  normal appearance, no masses or tenderness   Skin: normal coloration and turgor, no rashes    Neurologic: oriented, no focal deficits   Extremities: normal strength, tone, and muscle mass   HEENT extra ocular movement intact   Mouth/Teeth mucous membranes moist, pharynx normal without lesions and dental hygiene good   Neck supple and no masses   Cardiovascular: regular rate and rhythm   Respiratory:  appears well, vitals normal, no respiratory distress,  acyanotic, normal RR, chest clear, no wheezing, crepitations, rhonchi, normal symmetric air entry   Abdomen: soft, non-tender; bowel sounds normal; no masses,  no organomegaly   Urinary:       Assessment:    Pregnancy: [id number] Patient Active Problem List   Diagnosis Date Noted  . Supervision of normal first pregnancy, antepartum 05/04/2018  . Sickle cell anemia with crisis (HCC) 11/02/2017  . Other fatigue 06/06/2015  . Vitamin D deficiency 06/06/2015  . Sickle cell pain crisis (HCC) 05/26/2015  . Sickle cell anemia with pain (HCC) 03/25/2015  . Hypoglycemia 03/20/2014  . Migraine without aura, without mention of intractable migraine without mention of status migrainosus 12/18/2013  . Dizziness 08/24/2013  . 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 Hemoglobin [place] with crisis (HCC) 06/18/2012  . Sickle cell anemia (HCC) 06/17/2012  . Hyponatremia 06/17/2012  . Depression, major, single episode, moderate (HCC) 07/31/2011  . Generalized anxiety disorder 07/31/2011        Plan:     Initial labs drawn. Prenatal vitamins. Problem list reviewed and updated. Genetic Screening discussed : panorama ordered .  Ultrasound discussed; fetal  survey: requested.  Follow up in 4 weeks. 50% of 30 min visit spent on counseling and coordination of care.     Jessie Cowher 05/04/2018

## 2018-05-05 LAB — OBSTETRIC PANEL, INCLUDING HIV
ANTIBODY SCREEN: NEGATIVE
BASOS ABS: 0 10*3/uL (ref 0.0–0.2)
Basos: 0 %
EOS (ABSOLUTE): 0 10*3/uL (ref 0.0–0.4)
Eos: 1 %
HEP B S AG: NEGATIVE
HIV SCREEN 4TH GENERATION: NONREACTIVE
Hematocrit: 31.8 % — ABNORMAL LOW (ref 34.0–46.6)
Hemoglobin: 10.9 g/dL — ABNORMAL LOW (ref 11.1–15.9)
IMMATURE GRANULOCYTES: 0 %
Immature Grans (Abs): 0 10*3/uL (ref 0.0–0.1)
Lymphocytes Absolute: 1.4 10*3/uL (ref 0.7–3.1)
Lymphs: 20 %
MCH: 30.5 pg (ref 26.6–33.0)
MCHC: 34.3 g/dL (ref 31.5–35.7)
MCV: 89 fL (ref 79–97)
Monocytes Absolute: 0.4 10*3/uL (ref 0.1–0.9)
Monocytes: 6 %
NEUTROS ABS: 5 10*3/uL (ref 1.4–7.0)
NEUTROS PCT: 73 %
PLATELETS: 114 10*3/uL — AB (ref 150–450)
RBC: 3.57 x10E6/uL — ABNORMAL LOW (ref 3.77–5.28)
RDW: 14.8 % (ref 12.3–15.4)
RPR Ser Ql: NONREACTIVE
RUBELLA: 2.44 {index} (ref >0.99)
Rh Factor: POSITIVE
WBC: 6.9 10*3/uL (ref 3.4–10.8)

## 2018-05-05 LAB — CYTOLOGY - PAP
Adequacy: ABSENT
Chlamydia: NEGATIVE
DIAGNOSIS: NEGATIVE
Neisseria Gonorrhea: NEGATIVE

## 2018-05-07 LAB — CULTURE, OB URINE

## 2018-05-07 LAB — URINE CULTURE, OB REFLEX

## 2018-05-09 ENCOUNTER — Telehealth: Payer: Self-pay | Admitting: *Deleted

## 2018-05-09 NOTE — Telephone Encounter (Signed)
Pt called to office asking about Genetic testing. Pt made aware that she may go online and enter her testing information but results may take up to 2 weeks to return. Pt made aware she may call office anytime to check on results.

## 2018-05-11 ENCOUNTER — Encounter: Payer: Self-pay | Admitting: Family Medicine

## 2018-05-11 DIAGNOSIS — O99019 Anemia complicating pregnancy, unspecified trimester: Secondary | ICD-10-CM

## 2018-05-11 DIAGNOSIS — D571 Sickle-cell disease without crisis: Secondary | ICD-10-CM | POA: Insufficient documentation

## 2018-05-12 LAB — SMN1 COPY NUMBER ANALYSIS (SMA CARRIER SCREENING)

## 2018-05-12 LAB — CYSTIC FIBROSIS MUTATION 97: GENE DIS ANAL CARRIER INTERP BLD/T-IMP: NOT DETECTED

## 2018-06-06 ENCOUNTER — Encounter: Payer: Self-pay | Admitting: Obstetrics and Gynecology

## 2018-06-06 ENCOUNTER — Ambulatory Visit (INDEPENDENT_AMBULATORY_CARE_PROVIDER_SITE_OTHER): Payer: Medicaid Other | Admitting: Obstetrics and Gynecology

## 2018-06-06 VITALS — BP 113/72 | HR 105 | Wt 118.4 lb

## 2018-06-06 DIAGNOSIS — O99019 Anemia complicating pregnancy, unspecified trimester: Secondary | ICD-10-CM

## 2018-06-06 DIAGNOSIS — Z3400 Encounter for supervision of normal first pregnancy, unspecified trimester: Secondary | ICD-10-CM

## 2018-06-06 DIAGNOSIS — O99012 Anemia complicating pregnancy, second trimester: Secondary | ICD-10-CM

## 2018-06-06 DIAGNOSIS — Z3402 Encounter for supervision of normal first pregnancy, second trimester: Secondary | ICD-10-CM

## 2018-06-06 DIAGNOSIS — D571 Sickle-cell disease without crisis: Secondary | ICD-10-CM

## 2018-06-06 NOTE — Progress Notes (Signed)
Patient report feeling fetal flutter movements, denies pain.

## 2018-06-06 NOTE — Progress Notes (Signed)
   PRENATAL VISIT NOTE  Subjective:  Makayla Fletcher is a 22 y.o. G3P0020 at 478w0d being seen today for ongoing prenatal care.  She is currently monitored for the following issues for this low-risk pregnancy and has Depression, major, single episode, moderate (HCC); Generalized anxiety disorder; Sickle cell anemia (HCC); Sickle cell-hemoglobin C disease without crisis (HCC); Migraine without aura, without mention of intractable migraine without mention of status migrainosus; Vitamin D deficiency; Supervision of normal first pregnancy, antepartum; and Maternal sickle cell anemia affecting pregnancy, antepartum (HCC) on their problem list.  Patient reports no complaints.  Contractions: Not present. Vag. Bleeding: None.  Movement: Present. Denies leaking of fluid.   The following portions of the patient's history were reviewed and updated as appropriate: allergies, current medications, past family history, past medical history, past social history, past surgical history and problem list. Problem list updated.  Objective:   Vitals:   06/06/18 0900  BP: 113/72  Pulse: (!) 105  Weight: 118 lb 6.4 oz (53.7 kg)    Fetal Status: Fetal Heart Rate (bpm): 150   Movement: Present     General:  Alert, oriented and cooperative. Patient is in no acute distress.  Skin: Skin is warm and dry. No rash noted.   Cardiovascular: Normal heart rate noted  Respiratory: Normal respiratory effort, no problems with respiration noted  Abdomen: Soft, gravid, appropriate for gestational age.  Pain/Pressure: Absent     Pelvic: Cervical exam deferred        Extremities: Normal range of motion.  Edema: None  Mental Status: Normal mood and affect. Normal behavior. Normal judgment and thought content.   Assessment and Plan:  Pregnancy: G3P0020 at 388w0d  1. Supervision of normal first pregnancy, antepartum Patient is doing well without complaints AFP today Anatomy ultrasound scheduled - AFP, Serum, Open Spina  Bifida  2. Maternal sickle cell anemia affecting pregnancy, antepartum (HCC) Stable  Preterm labor symptoms and general obstetric precautions including but not limited to vaginal bleeding, contractions, leaking of fluid and fetal movement were reviewed in detail with the patient. Please refer to After Visit Summary for other counseling recommendations.  No follow-ups on file.  Future Appointments  Date Time Provider Department Center  07/25/2018  3:00 PM Mike Gipouglas, Andre, FNP [facility] None    Catalina AntiguaPeggy Jazlyne Gauger, MD

## 2018-06-08 LAB — AFP, SERUM, OPEN SPINA BIFIDA
AFP MoM: 1.27
AFP VALUE AFPOSL: 47.5 ng/mL
Gest. Age on Collection Date: 15 weeks
MATERNAL AGE AT EDD: 24.7 a
OSBR Risk 1 IN: 10000
TEST RESULTS AFP: NEGATIVE
Weight: 118 [lb_av]

## 2018-06-22 NOTE — L&D Delivery Note (Addendum)
Delivery Note Progressed to complete dilation at 0123.  Pushed very well to delivery.(second stage 1 hour, 26 minutes.   At 2:49 AM a viable and healthy female was delivered via Vaginal, Spontaneous (Presentation: LOA with compound hand, right harm crossed over body and was alongside left cheek ).  APGAR: 8, 9; weight  .   Placenta status: Usual cord traction on cord.  Pitocin started about 5-10 minutes after delivery.  Most of placenta cleared the cervix and was present in vagina.  There was a portion of the central maternal side of placenta which was adherent to uterus.  Maintained gentle traction, but bleeding increased so I consulted Dr Adrian Blackwater who arrived shortly thereafter and manually removed placenta.  The uterus inverted during this process and was replaced by Dr Adrian Blackwater.  Bleeding continued, small stream but continued.  Pitocin, TXA, Methergine were given.  Blood pressure began to decrease, so second IV was started and fluids infused.  4 units of PRBC were ordered and arrived soon after.  Anesthesia had been called to room to assist with redosing of epidural   Patient became unresponsive and additional personnel arrived.  She was supported with Ambu bag until she began to become more alert.  Narcan was given to counteract the dose of Fentanyl we had given during placental removal.  See RN notes for amounts of ephedrine and other meds given.  We also ordered one unit of FFP to follow the blood transfusion.  Blood pressure and tachycardia improved.   3 vessel Cord:  with the following complications: retained placenta, postpartum hemorrhage, uterine inversion with hypovolemic shock.  Anesthesia:  epidural Episiotomy: None Lacerations: None Suture Repair: none necessary Est. Blood Loss (mL):    Mom to postpartum.  Baby to Couplet care / Skin to Skin.  Wynelle Bourgeois 11/23/2018, 4:25 AM  [facility] Please schedule this patient for Postpartum visit in: 4 weeks with the following provider: Any  provider For C/S patients schedule nurse incision check in weeks 2 weeks: no High risk pregnancy complicated by: Sickle Cell anemia Delivery mode:  SVD Anticipated Birth Control:  POPs PP Procedures needed: none  Schedule Integrated BH visit: no  Above note sent to [place] office

## 2018-06-27 ENCOUNTER — Encounter (HOSPITAL_COMMUNITY): Payer: Self-pay

## 2018-07-04 ENCOUNTER — Ambulatory Visit (INDEPENDENT_AMBULATORY_CARE_PROVIDER_SITE_OTHER): Payer: Medicaid Other | Admitting: Obstetrics and Gynecology

## 2018-07-04 ENCOUNTER — Other Ambulatory Visit (HOSPITAL_COMMUNITY): Payer: Self-pay | Admitting: *Deleted

## 2018-07-04 ENCOUNTER — Ambulatory Visit (HOSPITAL_COMMUNITY)
Admission: RE | Admit: 2018-07-04 | Discharge: 2018-07-04 | Disposition: A | Discharge status: Home / Self Care | Payer: Medicaid Other | Source: Ambulatory Visit | Attending: Obstetrics and Gynecology | Admitting: Obstetrics and Gynecology

## 2018-07-04 ENCOUNTER — Other Ambulatory Visit: Payer: Self-pay | Admitting: Obstetrics and Gynecology

## 2018-07-04 ENCOUNTER — Encounter: Payer: Self-pay | Admitting: Obstetrics and Gynecology

## 2018-07-04 VITALS — BP 101/68 | HR 102 | Wt 126.5 lb

## 2018-07-04 DIAGNOSIS — Z3400 Encounter for supervision of normal first pregnancy, unspecified trimester: Secondary | ICD-10-CM | POA: Diagnosis present

## 2018-07-04 DIAGNOSIS — D571 Sickle-cell disease without crisis: Secondary | ICD-10-CM

## 2018-07-04 DIAGNOSIS — O99012 Anemia complicating pregnancy, second trimester: Secondary | ICD-10-CM

## 2018-07-04 DIAGNOSIS — Z3402 Encounter for supervision of normal first pregnancy, second trimester: Secondary | ICD-10-CM

## 2018-07-04 DIAGNOSIS — Z3A19 19 weeks gestation of pregnancy: Secondary | ICD-10-CM

## 2018-07-04 DIAGNOSIS — O99019 Anemia complicating pregnancy, unspecified trimester: Principal | ICD-10-CM

## 2018-07-04 DIAGNOSIS — Z363 Encounter for antenatal screening for malformations: Secondary | ICD-10-CM

## 2018-07-04 NOTE — Progress Notes (Signed)
Pt is here for ROB. [redacted]w[redacted]d

## 2018-07-04 NOTE — Progress Notes (Signed)
   PRENATAL VISIT NOTE  Subjective:  Makayla Fletcher is a 23 y.o. G3P0020 at [redacted]w[redacted]d being seen today for ongoing prenatal care.  She is currently monitored for the following issues for this low-risk pregnancy and has Depression, major, single episode, moderate (HCC); Generalized anxiety disorder; Sickle cell anemia (HCC); Sickle cell-hemoglobin C disease without crisis (HCC); Migraine without aura, without mention of intractable migraine without mention of status migrainosus; Vitamin D deficiency; Supervision of normal first pregnancy, antepartum; and Maternal sickle cell anemia affecting pregnancy, antepartum (HCC) on their problem list.  Patient reports no complaints.  Contractions: Not present. Vag. Bleeding: None.  Movement: Present. Denies leaking of fluid.   The following portions of the patient's history were reviewed and updated as appropriate: allergies, current medications, past family history, past medical history, past social history, past surgical history and problem list. Problem list updated.  Objective:   Vitals:   07/04/18 0809  BP: 101/68  Pulse: (!) 102  Weight: 126 lb 8 oz (57.4 kg)    Fetal Status: Fetal Heart Rate (bpm): 150   Movement: Present     General:  Alert, oriented and cooperative. Patient is in no acute distress.  Skin: Skin is warm and dry. No rash noted.   Cardiovascular: Normal heart rate noted  Respiratory: Normal respiratory effort, no problems with respiration noted  Abdomen: Soft, gravid, appropriate for gestational age.  Pain/Pressure: Absent     Pelvic: Cervical exam deferred        Extremities: Normal range of motion.  Edema: None  Mental Status: Normal mood and affect. Normal behavior. Normal judgment and thought content.   Assessment and Plan:  Pregnancy: G3P0020 at [redacted]w[redacted]d  1. Supervision of normal first pregnancy, antepartum Patient is doing well without complaints Anatomy ultrasound today  2. Maternal sickle cell anemia affecting  pregnancy, antepartum (HCC) Stable Follow up growth and antenatal testing per guidelines  Preterm labor symptoms and general obstetric precautions including but not limited to vaginal bleeding, contractions, leaking of fluid and fetal movement were reviewed in detail with the patient. Please refer to After Visit Summary for other counseling recommendations.  Return in about 4 weeks (around 08/01/2018) for ROB.  Future Appointments  Date Time Provider Department Center  07/04/2018  8:45 AM [facility] [place] 2 [facility]  07/25/2018  3:00 PM Mike Gip, FNP [facility] None    Catalina Antigua, MD

## 2018-07-25 ENCOUNTER — Ambulatory Visit (INDEPENDENT_AMBULATORY_CARE_PROVIDER_SITE_OTHER): Payer: 59 | Admitting: Family Medicine

## 2018-07-25 ENCOUNTER — Encounter: Payer: Self-pay | Admitting: Family Medicine

## 2018-07-25 VITALS — BP 120/73 | HR 108 | Temp 98.1°F | Resp 16 | Ht 62.0 in | Wt 129.0 lb

## 2018-07-25 DIAGNOSIS — D5700 Hb-SS disease with crisis, unspecified: Secondary | ICD-10-CM | POA: Diagnosis not present

## 2018-07-25 LAB — POCT URINALYSIS DIPSTICK
Bilirubin, UA: NEGATIVE
Blood, UA: NEGATIVE
Glucose, UA: NEGATIVE
Ketones, UA: NEGATIVE
Leukocytes, UA: NEGATIVE
Nitrite, UA: NEGATIVE
Protein, UA: NEGATIVE
Spec Grav, UA: 1.02 (ref 1.010–1.025)
Urobilinogen, UA: 2 E.U./dL — AB
pH, UA: 6 (ref 5.0–8.0)

## 2018-07-25 NOTE — Progress Notes (Signed)
[facility]IC[facility] AND SICKLE CELL CARE  SICKLE CELL ANEMIA FOLLOW UP VISIT PROVIDER: Mike GipAndre Dafne Nield, FNP    Subjective:   Makayla Fletcher  is a 23 y.o.  female who  has a past medical history of Depression, Migraine without aura, without mention of intractable migraine without mention of status migrainosus, Sickle cell anemia (HCC), and Urinary tract infection (July 2013). presents for a follow up for Sickle Cell Anemia. The patient has had 0 admissions in the past 6 months. Denies recent crises. Is currently pregnant with a due date of 11/28/2018.  Patient is doing well and without complaints.    Review of Systems  Constitutional: Negative.   HENT: Negative.   Eyes: Negative.   Respiratory: Negative.   Cardiovascular: Negative.   Gastrointestinal: Negative.   Genitourinary: Negative.   Musculoskeletal: Negative.   Skin: Negative.   Neurological: Negative.   Psychiatric/Behavioral: Negative.     Objective:   Objective  BP 120/73 (BP Location: Left Arm, Patient Position: Sitting, Cuff Size: Normal)   Pulse (!) 108   Temp 98.1 F (36.7 C) (Oral)   Resp 16   Ht 5\' 2"  (1.575 m)   Wt 129 lb (58.5 kg)   LMP 02/21/2018   SpO2 99%   BMI 23.59 kg/m   Wt Readings from Last 3 Encounters:  07/25/18 129 lb (58.5 kg)  07/04/18 126 lb 8 oz (57.4 kg)  06/06/18 118 lb 6.4 oz (53.7 kg)     Physical Exam Vitals signs and nursing note reviewed.  Constitutional:      General: She is not in acute distress.    Appearance: She is well-developed.  HENT:     Head: Normocephalic and atraumatic.  Eyes:     Conjunctiva/sclera: Conjunctivae normal.     Pupils: Pupils are equal, round, and reactive to light.  Neck:     Musculoskeletal: Normal range of motion.  Cardiovascular:     Rate and Rhythm: Normal rate and regular rhythm.     Heart sounds: Normal heart sounds.  Pulmonary:     Effort: Pulmonary effort is normal. No respiratory distress.     Breath sounds:  Normal breath sounds.  Abdominal:     General: Bowel sounds are normal. There is no distension.     Palpations: Abdomen is soft.  Musculoskeletal: Normal range of motion.  Skin:    General: Skin is warm and dry.  Neurological:     Mental Status: She is alert and oriented to person, place, and time.  Psychiatric:        Behavior: Behavior normal.        Thought Content: Thought content normal.      Assessment/Plan:   Assessment   Encounter Diagnosis  Name Primary?  Marland Kitchen. Hb-SS disease with crisis (HCC) Yes     Plan  1. Hb-SS disease with crisis (HCC) - Urinalysis Dipstick - CBC with Differential - Hemoglobinopathy Evaluation - Comprehensive metabolic panel - VITAMIN D 25 Hydroxy (Vit-D Deficiency, Fractures)    Return to care as scheduled and prn. Patient verbalized understanding and agreed with plan of care.   1. Sickle cell disease -  We discussed the need for good hydration, monitoring of hydration status, avoidance of heat, cold, stress, and infection triggers. We discussed the risks and benefits of Hydrea, including bone marrow suppression, the possibility of GI upset, skin ulcers, hair thinning, and teratogenicity. The patient was reminded of the need to seek medical attention of any symptoms of bleeding,  anemia, or infection. Continue folic acid 1 mg daily to prevent aplastic bone marrow crises.   2. Pulmonary evaluation - Patient denies severe recurrent wheezes, shortness of breath with exercise, or persistent cough. If these symptoms develop, pulmonary function tests with spirometry will be ordered, and if abnormal, plan on referral to Pulmonology for further evaluation.  3. Cardiac - Routine screening for pulmonary hypertension is not recommended.  4. Eye - High risk of proliferative retinopathy. Annual eye exam with retinal exam recommended to patient.  5. Immunization status -  Yearly influenza vaccination is recommended, as well as being up to date with  Meningococcal and Pneumococcal vaccines.   6. Acute and chronic painful episodes - We discussed that pt is to receive Schedule II prescriptions only from [place]. Pt is also aware that the prescription history is available to [place] online through the [facility] CSRS. Controlled substance agreement signed. We reminded JEORGIA DAUPHIN that all patients receiving Schedule II narcotics must be seen for follow within one month of prescription being requested. We reviewed the terms of our pain agreement, including the need to keep medicines in a safe locked location away from children or pets, and the need to report excess sedation or constipation, measures to avoid constipation, and policies related to early refills and stolen prescriptions. According to the [place] [facility] program, we have reviewed details related to analgesia, adverse effects, aberrant behaviors.  7. Iron overload from chronic transfusion.  Not applicable at this time.  If this occurs will use Exjade for management.   8. Vitamin D deficiency - Drisdol 50,000 units weekly. Patient encouraged to take as prescribed.   The above recommendations are taken from the NIH Evidence-Based Management of Sickle Cell Disease: Expert Panel Report, 28638.   Ms. Andr L. Riley Lam, FNP-BC [facility] [address] [place], [place] [address] [phone]  This note has been created with Dragon speech recognition software and smart phrase technology. Any transcriptional errors are unintentional.

## 2018-07-25 NOTE — Patient Instructions (Signed)
Sickle Cell Anemia, Adult °Sickle cell anemia is a condition where your red blood cells are shaped like sickles. Red blood cells carry oxygen through the body. Sickle-shaped cells do not live as long as normal red blood cells. They also clump together and block blood from flowing through the blood vessels. This prevents the body from getting enough oxygen. Sickle cell anemia causes organ damage and pain. It also increases the risk of infection. °Follow these instructions at home: °Medicines °· Take over-the-counter and prescription medicines only as told by your doctor. °· If you were prescribed an antibiotic medicine, take it as told by your doctor. Do not stop taking the antibiotic even if you start to feel better. °· If you develop a fever, do not take medicines to lower the fever right away. Tell your doctor about the fever. °Managing pain, stiffness, and swelling °· Try these methods to help with pain: °? Use a heating pad. °? Take a warm bath. °? Distract yourself, such as by watching TV. °Eating and drinking °· Drink enough fluid to keep your pee (urine) clear or pale yellow. Drink more in hot weather and during exercise. °· Limit or avoid alcohol. °· Eat a healthy diet. Eat plenty of fruits, vegetables, whole grains, and lean protein. °· Take vitamins and supplements as told by your doctor. °Traveling °· When traveling, keep these with you: °? Your medical information. °? The names of your doctors. °? Your medicines. °· If you need to take an airplane, talk to your doctor first. °Activity °· Rest often. °· Avoid exercises that make your heart beat much faster, such as jogging. °General instructions °· Do not use products that have nicotine or tobacco, such as cigarettes and e-cigarettes. If you need help quitting, ask your doctor. °· Consider wearing a medical alert bracelet. °· Avoid being in high places (high altitudes), such as mountains. °· Avoid very hot or cold temperatures. °· Avoid places where the  temperature changes a lot. °· Keep all follow-up visits as told by your doctor. This is important. °Contact a doctor if: °· A joint hurts. °· Your feet or hands hurt or swell. °· You feel tired (fatigued). °Get help right away if: °· You have symptoms of infection. These include: °? Fever. °? Chills. °? Being very tired. °? Irritability. °? Poor eating. °? Throwing up (vomiting). °· You feel dizzy or faint. °· You have new stomach pain, especially on the left side. °· You have a an erection (priapism) that lasts more than 4 hours. °· You have numbness in your arms or legs. °· You have a hard time moving your arms or legs. °· You have trouble talking. °· You have pain that does not go away when you take medicine. °· You are short of breath. °· You are breathing fast. °· You have a long-term cough. °· You have pain in your chest. °· You have a bad headache. °· You have a stiff neck. °· Your stomach looks bloated even though you did not eat much. °· Your skin is pale. °· You suddenly cannot see well. °Summary °· Sickle cell anemia is a condition where your red blood cells are shaped like sickles. °· Follow your doctor's advice on ways to manage pain, food to eat, activities to do, and steps to take for safe travel. °· Get medical help right away if you have any signs of infection, such as a fever. °This information is not intended to replace advice given to you by your   health care provider. Make sure you discuss any questions you have with your health care provider. °Document Released: 03/29/2013 Document Revised: 07/14/2016 Document Reviewed: 07/14/2016 °Elsevier Interactive Patient Education © 2019 [employer] ° °

## 2018-07-29 LAB — HEMOGLOBINOPATHY EVALUATION
Ferritin: 56 ng/mL (ref 15–150)
Hematocrit: 26.8 % — ABNORMAL LOW (ref 34.0–46.6)
Hemoglobin: 9.5 g/dL — ABNORMAL LOW (ref 11.1–15.9)
Hgb A2 Quant: 4 % — ABNORMAL HIGH (ref 1.8–3.2)
Hgb A: 0 % — ABNORMAL LOW (ref 96.4–98.8)
Hgb C: 45.2 % — ABNORMAL HIGH
Hgb F Quant: 1.7 % (ref 0.0–2.0)
Hgb S: 49.1 % — ABNORMAL HIGH
Hgb Solubility: POSITIVE — AB
Hgb Variant: 0 %
MCH: 32.4 pg (ref 26.6–33.0)
MCHC: 35.4 g/dL (ref 31.5–35.7)
MCV: 92 fL (ref 79–97)
Platelets: 133 10*3/uL — ABNORMAL LOW (ref 150–450)
RBC: 2.93 x10E6/uL — ABNORMAL LOW (ref 3.77–5.28)
RDW: 13.4 % (ref 11.7–15.4)
WBC: 9.2 10*3/uL (ref 3.4–10.8)

## 2018-07-29 LAB — CBC WITH DIFFERENTIAL/PLATELET
Basophils Absolute: 0 10*3/uL (ref 0.0–0.2)
Basos: 0 %
EOS (ABSOLUTE): 0.1 10*3/uL (ref 0.0–0.4)
Eos: 1 %
Immature Grans (Abs): 0 10*3/uL (ref 0.0–0.1)
Immature Granulocytes: 0 %
Lymphocytes Absolute: 1.2 10*3/uL (ref 0.7–3.1)
Lymphs: 14 %
Monocytes Absolute: 0.5 10*3/uL (ref 0.1–0.9)
Monocytes: 5 %
Neutrophils Absolute: 7.3 10*3/uL — ABNORMAL HIGH (ref 1.4–7.0)
Neutrophils: 80 %

## 2018-07-29 LAB — COMPREHENSIVE METABOLIC PANEL
ALT: 10 IU/L (ref 0–32)
AST: 21 IU/L (ref 0–40)
Albumin/Globulin Ratio: 1.3 (ref 1.2–2.2)
Albumin: 3.7 g/dL — ABNORMAL LOW (ref 3.9–5.0)
Alkaline Phosphatase: 62 IU/L (ref 39–117)
BUN/Creatinine Ratio: 8 — ABNORMAL LOW (ref 9–23)
BUN: 4 mg/dL — ABNORMAL LOW (ref 6–20)
Bilirubin Total: 0.6 mg/dL (ref 0.0–1.2)
CO2: 23 mmol/L (ref 20–29)
Calcium: 8.9 mg/dL (ref 8.7–10.2)
Chloride: 100 mmol/L (ref 96–106)
Creatinine, Ser: 0.52 mg/dL — ABNORMAL LOW (ref 0.57–1.00)
GFR calc Af Amer: 155 mL/min/{1.73_m2} (ref >59)
GFR calc non Af Amer: 134 mL/min/{1.73_m2} (ref >59)
Globulin, Total: 2.9 g/dL (ref 1.5–4.5)
Glucose: 94 mg/dL (ref 65–99)
Potassium: 3.8 mmol/L (ref 3.5–5.2)
Sodium: 136 mmol/L (ref 134–144)
Total Protein: 6.6 g/dL (ref 6.0–8.5)

## 2018-07-29 LAB — VITAMIN D 25 HYDROXY (VIT D DEFICIENCY, FRACTURES): Vit D, 25-Hydroxy: 27 ng/mL — ABNORMAL LOW (ref 30.0–100.0)

## 2018-08-01 ENCOUNTER — Encounter: Payer: Self-pay | Admitting: Obstetrics and Gynecology

## 2018-08-01 ENCOUNTER — Ambulatory Visit (INDEPENDENT_AMBULATORY_CARE_PROVIDER_SITE_OTHER): Payer: Medicaid Other | Admitting: Obstetrics and Gynecology

## 2018-08-01 ENCOUNTER — Encounter (HOSPITAL_COMMUNITY): Payer: Self-pay

## 2018-08-01 ENCOUNTER — Ambulatory Visit (HOSPITAL_COMMUNITY)
Admission: RE | Admit: 2018-08-01 | Discharge: 2018-08-01 | Disposition: A | Discharge status: Home / Self Care | Payer: 59 | Source: Ambulatory Visit | Attending: Obstetrics and Gynecology | Admitting: Obstetrics and Gynecology

## 2018-08-01 VITALS — BP 108/70 | HR 118 | Wt 131.3 lb

## 2018-08-01 DIAGNOSIS — Z3402 Encounter for supervision of normal first pregnancy, second trimester: Secondary | ICD-10-CM

## 2018-08-01 DIAGNOSIS — Z3400 Encounter for supervision of normal first pregnancy, unspecified trimester: Secondary | ICD-10-CM

## 2018-08-01 DIAGNOSIS — Z362 Encounter for other antenatal screening follow-up: Secondary | ICD-10-CM

## 2018-08-01 DIAGNOSIS — Z3A23 23 weeks gestation of pregnancy: Secondary | ICD-10-CM

## 2018-08-01 DIAGNOSIS — D571 Sickle-cell disease without crisis: Secondary | ICD-10-CM | POA: Diagnosis not present

## 2018-08-01 DIAGNOSIS — O99012 Anemia complicating pregnancy, second trimester: Secondary | ICD-10-CM | POA: Diagnosis not present

## 2018-08-01 DIAGNOSIS — O99019 Anemia complicating pregnancy, unspecified trimester: Secondary | ICD-10-CM

## 2018-08-01 NOTE — Progress Notes (Signed)
   PRENATAL VISIT NOTE  Subjective:  Makayla Fletcher is a 23 y.o. G3P0020 at 10076w0d being seen today for ongoing prenatal care.  She is currently monitored for the following issues for this low-risk pregnancy and has Depression, major, single episode, moderate (HCC); Generalized anxiety disorder; Sickle cell anemia (HCC); Sickle cell-hemoglobin C disease without crisis (HCC); Migraine without aura, without mention of intractable migraine without mention of status migrainosus; Vitamin D deficiency; Supervision of normal first pregnancy, antepartum; and Maternal sickle cell anemia affecting pregnancy, antepartum (HCC) on their problem list.  Patient reports no complaints.  Contractions: Not present. Vag. Bleeding: None.  Movement: Present. Denies leaking of fluid.   The following portions of the patient's history were reviewed and updated as appropriate: allergies, current medications, past family history, past medical history, past social history, past surgical history and problem list. Problem list updated.  Objective:   Vitals:   08/01/18 0831  BP: 108/70  Pulse: (!) 118  Weight: 131 lb 4.8 oz (59.6 kg)    Fetal Status: Fetal Heart Rate (bpm): 140 Fundal Height: 23 cm Movement: Present     General:  Alert, oriented and cooperative. Patient is in no acute distress.  Skin: Skin is warm and dry. No rash noted.   Cardiovascular: Normal heart rate noted  Respiratory: Normal respiratory effort, no problems with respiration noted  Abdomen: Soft, gravid, appropriate for gestational age.  Pain/Pressure: Absent     Pelvic: Cervical exam deferred        Extremities: Normal range of motion.  Edema: None  Mental Status: Normal mood and affect. Normal behavior. Normal judgment and thought content.   Assessment and Plan:  Pregnancy: G3P0020 at 4876w0d  1. Supervision of normal first pregnancy, antepartum Patient is doing well without complaints Third trimester labs next visit  2. Maternal  sickle cell anemia affecting pregnancy, antepartum (HCC) Follow up growth ultrasound today  Preterm labor symptoms and general obstetric precautions including but not limited to vaginal bleeding, contractions, leaking of fluid and fetal movement were reviewed in detail with the patient. Please refer to After Visit Summary for other counseling recommendations.  Return in about 4 weeks (around 08/29/2018) for ROB, 2 hr glucola next visit.  Future Appointments  Date Time Provider Department Center  08/01/2018  9:30 AM [facility] [place] 1 [facility]  01/23/2019  2:00 PM Mike Gipouglas, Andre, FNP [facility] None    Catalina AntiguaPeggy Jonnelle Lawniczak, MD

## 2018-08-01 NOTE — Progress Notes (Signed)
Pt is here for ROB. [redacted]w[redacted]d.

## 2018-08-29 ENCOUNTER — Encounter: Payer: Self-pay | Admitting: Obstetrics and Gynecology

## 2018-08-29 ENCOUNTER — Other Ambulatory Visit: Payer: 59

## 2018-08-29 ENCOUNTER — Ambulatory Visit (INDEPENDENT_AMBULATORY_CARE_PROVIDER_SITE_OTHER): Payer: 59 | Admitting: Obstetrics and Gynecology

## 2018-08-29 VITALS — BP 110/72 | HR 125 | Wt 131.0 lb

## 2018-08-29 DIAGNOSIS — O99019 Anemia complicating pregnancy, unspecified trimester: Secondary | ICD-10-CM

## 2018-08-29 DIAGNOSIS — D571 Sickle-cell disease without crisis: Secondary | ICD-10-CM

## 2018-08-29 DIAGNOSIS — Z3A27 27 weeks gestation of pregnancy: Secondary | ICD-10-CM

## 2018-08-29 DIAGNOSIS — Z3400 Encounter for supervision of normal first pregnancy, unspecified trimester: Secondary | ICD-10-CM

## 2018-08-29 DIAGNOSIS — O99012 Anemia complicating pregnancy, second trimester: Secondary | ICD-10-CM

## 2018-08-29 NOTE — Patient Instructions (Signed)
Third Trimester of Pregnancy The third trimester is from week 28 through week 40 (months 7 through 9). The third trimester is a time when the unborn baby (fetus) is growing rapidly. At the end of the ninth month, the fetus is about 20 inches in length and weighs 6-10 pounds. Body changes during your third trimester Your body will continue to go through many changes during pregnancy. The changes vary from woman to woman. During the third trimester:  Your weight will continue to increase. You can expect to gain 25-35 pounds (11-16 kg) by the end of the pregnancy.  You may begin to get stretch marks on your hips, abdomen, and breasts.  You may urinate more often because the fetus is moving lower into your pelvis and pressing on your bladder.  You may develop or continue to have heartburn. This is caused by increased hormones that slow down muscles in the digestive tract.  You may develop or continue to have constipation because increased hormones slow digestion and cause the muscles that push waste through your intestines to relax.  You may develop hemorrhoids. These are swollen veins (varicose veins) in the rectum that can itch or be painful.  You may develop swollen, bulging veins (varicose veins) in your legs.  You may have increased body aches in the pelvis, back, or thighs. This is due to weight gain and increased hormones that are relaxing your joints.  You may have changes in your hair. These can include thickening of your hair, rapid growth, and changes in texture. Some women also have hair loss during or after pregnancy, or hair that feels dry or thin. Your hair will most likely return to normal after your baby is born.  Your breasts will continue to grow and they will continue to become tender. A yellow fluid (colostrum) may leak from your breasts. This is the first milk you are producing for your baby.  Your belly button may stick out.  You may notice more swelling in your hands,  face, or ankles.  You may have increased tingling or numbness in your hands, arms, and legs. The skin on your belly may also feel numb.  You may feel short of breath because of your expanding uterus.  You may have more problems sleeping. This can be caused by the size of your belly, increased need to urinate, and an increase in your body's metabolism.  You may notice the fetus "dropping," or moving lower in your abdomen (lightening).  You may have increased vaginal discharge.  You may notice your joints feel loose and you may have pain around your pelvic bone. What to expect at prenatal visits You will have prenatal exams every 2 weeks until week 36. Then you will have weekly prenatal exams. During a routine prenatal visit:  You will be weighed to make sure you and the baby are growing normally.  Your blood pressure will be taken.  Your abdomen will be measured to track your baby's growth.  The fetal heartbeat will be listened to.  Any test results from the previous visit will be discussed.  You may have a cervical check near your due date to see if your cervix has softened or thinned (effaced).  You will be tested for Group B streptococcus. This happens between 35 and 37 weeks. Your health care provider may ask you:  What your birth plan is.  How you are feeling.  If you are feeling the baby move.  If you have had any abnormal   symptoms, such as leaking fluid, bleeding, severe headaches, or abdominal cramping.  If you are using any tobacco products, including cigarettes, chewing tobacco, and electronic cigarettes.  If you have any questions. Other tests or screenings that may be performed during your third trimester include:  Blood tests that check for low iron levels (anemia).  Fetal testing to check the health, activity level, and growth of the fetus. Testing is done if you have certain medical conditions or if there are problems during the pregnancy.  Nonstress test  (NST). This test checks the health of your baby to make sure there are no signs of problems, such as the baby not getting enough oxygen. During this test, a belt is placed around your belly. The baby is made to move, and its heart rate is monitored during movement. What is false labor? False labor is a condition in which you feel small, irregular tightenings of the muscles in the womb (contractions) that usually go away with rest, changing position, or drinking water. These are called Braxton Hicks contractions. Contractions may last for hours, days, or even weeks before true labor sets in. If contractions come at regular intervals, become more frequent, increase in intensity, or become painful, you should see your health care provider. What are the signs of labor?  Abdominal cramps.  Regular contractions that start at 10 minutes apart and become stronger and more frequent with time.  Contractions that start on the top of the uterus and spread down to the lower abdomen and back.  Increased pelvic pressure and dull back pain.  A watery or bloody mucus discharge that comes from the vagina.  Leaking of amniotic fluid. This is also known as your "water breaking." It could be a slow trickle or a gush. Let your health care provider know if it has a color or strange odor. If you have any of these signs, call your health care provider right away, even if it is before your due date. Follow these instructions at home: Medicines  Follow your health care provider's instructions regarding medicine use. Specific medicines may be either safe or unsafe to take during pregnancy.  Take a prenatal vitamin that contains at least 600 micrograms (mcg) of folic acid.  If you develop constipation, try taking a stool softener if your health care provider approves. Eating and drinking   Eat a balanced diet that includes fresh fruits and vegetables, whole grains, good sources of protein such as meat, eggs, or tofu,  and low-fat dairy. Your health care provider will help you determine the amount of weight gain that is right for you.  Avoid raw meat and uncooked cheese. These carry germs that can cause birth defects in the baby.  If you have low calcium intake from food, talk to your health care provider about whether you should take a daily calcium supplement.  Eat four or five small meals rather than three large meals a day.  Limit foods that are high in fat and processed sugars, such as fried and sweet foods.  To prevent constipation: ? Drink enough fluid to keep your urine clear or pale yellow. ? Eat foods that are high in fiber, such as fresh fruits and vegetables, whole grains, and beans. Activity  Exercise only as directed by your health care provider. Most women can continue their usual exercise routine during pregnancy. Try to exercise for 30 minutes at least 5 days a week. Stop exercising if you experience uterine contractions.  Avoid heavy lifting.  Do   not exercise in extreme heat or humidity, or at high altitudes.  Wear low-heel, comfortable shoes.  Practice good posture.  You may continue to have sex unless your health care provider tells you otherwise. Relieving pain and discomfort  Take frequent breaks and rest with your legs elevated if you have leg cramps or low back pain.  Take warm sitz baths to soothe any pain or discomfort caused by hemorrhoids. Use hemorrhoid cream if your health care provider approves.  Wear a good support bra to prevent discomfort from breast tenderness.  If you develop varicose veins: ? Wear support pantyhose or compression stockings as told by your healthcare provider. ? Elevate your feet for 15 minutes, 3-4 times a day. Prenatal care  Write down your questions. Take them to your prenatal visits.  Keep all your prenatal visits as told by your health care provider. This is important. Safety  Wear your seat belt at all times when driving.  Make  a list of emergency phone numbers, including numbers for family, friends, the hospital, and police and fire departments. General instructions  Avoid cat litter boxes and soil used by cats. These carry germs that can cause birth defects in the baby. If you have a cat, ask someone to clean the litter box for you.  Do not travel far distances unless it is absolutely necessary and only with the approval of your health care provider.  Do not use hot tubs, steam rooms, or saunas.  Do not drink alcohol.  Do not use any products that contain nicotine or tobacco, such as cigarettes and e-cigarettes. If you need help quitting, ask your health care provider.  Do not use any medicinal herbs or unprescribed drugs. These chemicals affect the formation and growth of the baby.  Do not douche or use tampons or scented sanitary pads.  Do not cross your legs for long periods of time.  To prepare for the arrival of your baby: ? Take prenatal classes to understand, practice, and ask questions about labor and delivery. ? Make a trial run to the hospital. ? Visit the hospital and tour the maternity area. ? Arrange for maternity or paternity leave through employers. ? Arrange for family and friends to take care of pets while you are in the hospital. ? Purchase a rear-facing car seat and make sure you know how to install it in your car. ? Pack your hospital bag. ? Prepare the baby's nursery. Make sure to remove all pillows and stuffed animals from the baby's crib to prevent suffocation.  Visit your dentist if you have not gone during your pregnancy. Use a soft toothbrush to brush your teeth and be gentle when you floss. Contact a health care provider if:  You are unsure if you are in labor or if your water has broken.  You become dizzy.  You have mild pelvic cramps, pelvic pressure, or nagging pain in your abdominal area.  You have lower back pain.  You have persistent nausea, vomiting, or  diarrhea.  You have an unusual or bad smelling vaginal discharge.  You have pain when you urinate. Get help right away if:  Your water breaks before 37 weeks.  You have regular contractions less than 5 minutes apart before 37 weeks.  You have a fever.  You are leaking fluid from your vagina.  You have spotting or bleeding from your vagina.  You have severe abdominal pain or cramping.  You have rapid weight loss or weight gain.  You have   shortness of breath with chest pain.  You notice sudden or extreme swelling of your face, hands, ankles, feet, or legs.  Your baby makes fewer than 10 movements in 2 hours.  You have severe headaches that do not go away when you take medicine.  You have vision changes. Summary  The third trimester is from week 28 through week 40, months 7 through 9. The third trimester is a time when the unborn baby (fetus) is growing rapidly.  During the third trimester, your discomfort may increase as you and your baby continue to gain weight. You may have abdominal, leg, and back pain, sleeping problems, and an increased need to urinate.  During the third trimester your breasts will keep growing and they will continue to become tender. A yellow fluid (colostrum) may leak from your breasts. This is the first milk you are producing for your baby.  False labor is a condition in which you feel small, irregular tightenings of the muscles in the womb (contractions) that eventually go away. These are called Braxton Hicks contractions. Contractions may last for hours, days, or even weeks before true labor sets in.  Signs of labor can include: abdominal cramps; regular contractions that start at 10 minutes apart and become stronger and more frequent with time; watery or bloody mucus discharge that comes from the vagina; increased pelvic pressure and dull back pain; and leaking of amniotic fluid. This information is not intended to replace advice given to you by your  health care provider. Make sure you discuss any questions you have with your health care provider. Document Released: 06/02/2001 Document Revised: 07/14/2016 Document Reviewed: 07/14/2016 Elsevier Interactive Patient Education  2019 [employer]  

## 2018-08-29 NOTE — Progress Notes (Signed)
Subjective:  Makayla Fletcher is a 23 y.o. G3P0020 at [redacted]w[redacted]d being seen today for ongoing prenatal care.  She is currently monitored for the following issues for this low-risk pregnancy and has Depression, major, single episode, moderate (HCC); Generalized anxiety disorder; Sickle cell anemia (HCC); Sickle cell-hemoglobin C disease without crisis (HCC); Migraine without aura, without mention of intractable migraine without mention of status migrainosus; Vitamin D deficiency; Supervision of normal first pregnancy, antepartum; and Maternal sickle cell anemia affecting pregnancy, antepartum (HCC) on their problem list.  Patient reports no complaints.  Contractions: Not present. Vag. Bleeding: None.  Movement: Present. Denies leaking of fluid.   The following portions of the patient's history were reviewed and updated as appropriate: allergies, current medications, past family history, past medical history, past social history, past surgical history and problem list. Problem list updated.  Objective:   Vitals:   08/29/18 0839  BP: 110/72  Pulse: (!) 125  Weight: 59.4 kg    Fetal Status: Fetal Heart Rate (bpm): 156   Movement: Present     General:  Alert, oriented and cooperative. Patient is in no acute distress.  Skin: Skin is warm and dry. No rash noted.   Cardiovascular: Normal heart rate noted  Respiratory: Normal respiratory effort, no problems with respiration noted  Abdomen: Soft, gravid, appropriate for gestational age. Pain/Pressure: Absent     Pelvic:  Cervical exam deferred        Extremities: Normal range of motion.  Edema: None  Mental Status: Normal mood and affect. Normal behavior. Normal judgment and thought content.   Urinalysis:      Assessment and Plan:  Pregnancy: G3P0020 at [redacted]w[redacted]d  1. Supervision of normal first pregnancy, antepartum Stable Growth scan ordered - Glucose Tolerance, 2 Hours w/1 Hour - CBC - RPR - HIV Antibody (routine testing w rflx)  2. Maternal  sickle cell anemia affecting pregnancy, antepartum (HCC) Stable - [place] MFM OB FOLLOW UP; Future  Preterm labor symptoms and general obstetric precautions including but not limited to vaginal bleeding, contractions, leaking of fluid and fetal movement were reviewed in detail with the patient. Please refer to After Visit Summary for other counseling recommendations.  Return in about 3 weeks (around 09/19/2018) for OB visit.   Hermina Staggers, MD

## 2018-08-30 LAB — CBC
Hematocrit: 29.6 % — ABNORMAL LOW (ref 34.0–46.6)
Hemoglobin: 10 g/dL — ABNORMAL LOW (ref 11.1–15.9)
MCH: 31.1 pg (ref 26.6–33.0)
MCHC: 33.8 g/dL (ref 31.5–35.7)
MCV: 92 fL (ref 79–97)
Platelets: 123 10*3/uL — ABNORMAL LOW (ref 150–450)
RBC: 3.22 x10E6/uL — ABNORMAL LOW (ref 3.77–5.28)
RDW: 14.3 % (ref 11.7–15.4)
WBC: 13 10*3/uL — ABNORMAL HIGH (ref 3.4–10.8)

## 2018-08-30 LAB — GLUCOSE TOLERANCE, 2 HOURS W/ 1HR
Glucose, 1 hour: 148 mg/dL (ref 65–179)
Glucose, 2 hour: 114 mg/dL (ref 65–152)
Glucose, Fasting: 80 mg/dL (ref 65–91)

## 2018-08-30 LAB — HIV ANTIBODY (ROUTINE TESTING W REFLEX): HIV Screen 4th Generation wRfx: NONREACTIVE

## 2018-08-30 LAB — RPR: RPR Ser Ql: NONREACTIVE

## 2018-08-31 NOTE — Progress Notes (Signed)
Do you want her to just take an over the counter iron supplement or does she need a prescription? Thanks.

## 2018-09-01 ENCOUNTER — Other Ambulatory Visit: Payer: Self-pay

## 2018-09-01 DIAGNOSIS — O99012 Anemia complicating pregnancy, second trimester: Secondary | ICD-10-CM

## 2018-09-01 MED ORDER — FERROUS SULFATE 325 (65 FE) MG PO TABS: 325.0000 mg | ORAL_TABLET | Freq: Two times a day (BID) | ORAL | 1 refills | Status: DC

## 2018-09-01 NOTE — Progress Notes (Signed)
Iron rx

## 2018-09-02 ENCOUNTER — Telehealth: Payer: Self-pay | Admitting: *Deleted

## 2018-09-02 NOTE — Telephone Encounter (Signed)
Pt called to office for recommendations for OTC cold medications. Pt made aware of safe OTC medications to use in pregnancy.

## 2018-09-09 ENCOUNTER — Telehealth: Payer: Self-pay

## 2018-09-09 NOTE — Telephone Encounter (Signed)
Returned call and answered question/concerns about corona virus.

## 2018-09-14 ENCOUNTER — Ambulatory Visit (HOSPITAL_COMMUNITY): Payer: 59

## 2018-09-16 ENCOUNTER — Other Ambulatory Visit: Payer: Self-pay | Admitting: Family Medicine

## 2018-09-16 MED ORDER — OXYCODONE HCL 5 MG PO TABS: 5.0000 mg | ORAL_TABLET | Freq: Three times a day (TID) | ORAL | 0 refills | Status: AC | PRN

## 2018-09-16 NOTE — Progress Notes (Signed)
Patient called the on-call provider line. She states that she is having a crises in her right shoulder. Has been taking tylenol with minimal relief. Also tried topical rub, rest and warm baths with epsom salt. Patient is 8 months pregnant and would like to not go to the ED if possible. Sent 5mg  Roxicodone to the pharmacy. Instructed patient that she can take 1/2 a tablet Q8H prn for pain due to opioid naivety. Discussed taking more frequently on day 1 and reducing the frequency on day 2 and day 3. If she continues to have crises pain, she can come to the day hospital on Monday.  The patient was given clear instructions to go to ER or return to medical center if symptoms do not improve, worsen or new problems develop. The patient verbalized understanding and agreed with plan of care.

## 2018-09-19 ENCOUNTER — Encounter: Payer: Self-pay | Admitting: Obstetrics

## 2018-09-19 ENCOUNTER — Encounter: Payer: 59 | Admitting: Obstetrics

## 2018-09-19 ENCOUNTER — Other Ambulatory Visit: Payer: Self-pay

## 2018-09-19 ENCOUNTER — Ambulatory Visit (INDEPENDENT_AMBULATORY_CARE_PROVIDER_SITE_OTHER): Payer: 59 | Admitting: Obstetrics

## 2018-09-19 DIAGNOSIS — O99013 Anemia complicating pregnancy, third trimester: Secondary | ICD-10-CM

## 2018-09-19 DIAGNOSIS — D571 Sickle-cell disease without crisis: Secondary | ICD-10-CM

## 2018-09-19 DIAGNOSIS — O0990 Supervision of high risk pregnancy, unspecified, unspecified trimester: Secondary | ICD-10-CM

## 2018-09-19 DIAGNOSIS — Z3A30 30 weeks gestation of pregnancy: Secondary | ICD-10-CM

## 2018-09-19 DIAGNOSIS — O99019 Anemia complicating pregnancy, unspecified trimester: Secondary | ICD-10-CM

## 2018-09-19 NOTE — Progress Notes (Signed)
Televisit ROB - No complaints per pt. TC transferred to MD.

## 2018-09-19 NOTE — Progress Notes (Signed)
Subjective:  SHARIAN TROTTER is a 23 y.o. G3P0020 at [redacted]w[redacted]d being seen today for ongoing prenatal care.  She is currently monitored for the following issues for this high-risk pregnancy and has Depression, major, single episode, moderate (HCC); Generalized anxiety disorder; Sickle cell anemia (HCC); Sickle cell-hemoglobin C disease without crisis (HCC); Migraine without aura, without mention of intractable migraine without mention of status migrainosus; Vitamin D deficiency; Supervision of normal first pregnancy, antepartum; and Maternal sickle cell anemia affecting pregnancy, antepartum (HCC) on their problem list.  Patient reports no complaints.  Contractions: Not present. Vag. Bleeding: None.  Movement: Present. Denies leaking of fluid.   The following portions of the patient's history were reviewed and updated as appropriate: allergies, current medications, past family history, past medical history, past social history, past surgical history and problem list. Problem list updated.  Objective:  There were no vitals filed for this visit.  Fetal Status:     Movement: Present     PE:  Deferred  Urinalysis:      Assessment and Plan:  Pregnancy: G3P0020 at [redacted]w[redacted]d  1. Supervision of high risk pregnancy, antepartum - Hgb Nardin Disease - clinically stable  Preterm labor symptoms and general obstetric precautions including but not limited to vaginal bleeding, contractions, leaking of fluid and fetal movement were reviewed in detail with the patient. Please refer to After Visit Summary for other counseling recommendations.  Return in about 2 years (around 09/18/2020) for [facility].   Brock Bad, MD

## 2018-10-04 ENCOUNTER — Encounter: Payer: 59 | Admitting: Family Medicine

## 2018-10-05 ENCOUNTER — Ambulatory Visit (INDEPENDENT_AMBULATORY_CARE_PROVIDER_SITE_OTHER): Payer: 59 | Admitting: Obstetrics and Gynecology

## 2018-10-05 ENCOUNTER — Other Ambulatory Visit: Payer: Self-pay

## 2018-10-05 DIAGNOSIS — Z3400 Encounter for supervision of normal first pregnancy, unspecified trimester: Secondary | ICD-10-CM

## 2018-10-05 DIAGNOSIS — D5720 Sickle-cell/Hb-C disease without crisis: Secondary | ICD-10-CM

## 2018-10-05 DIAGNOSIS — G43019 Migraine without aura, intractable, without status migrainosus: Secondary | ICD-10-CM

## 2018-10-05 NOTE — Progress Notes (Signed)
CC: Hemorrhoids

## 2018-10-06 ENCOUNTER — Other Ambulatory Visit (HOSPITAL_COMMUNITY): Payer: Self-pay

## 2018-10-06 ENCOUNTER — Telehealth: Payer: Self-pay

## 2018-10-06 NOTE — Telephone Encounter (Signed)
  Left VM message to call and schedule Lab only visit next week for Urine Culture per Dr. Vergie Living

## 2018-10-06 NOTE — Progress Notes (Signed)
   TELEHEALTH VIRTUAL OBSTETRICS VISIT ENCOUNTER NOTE  Femina  I connected with Makayla Fletcher on 10/05/18 at  3:00 PM EDT by telephone at home and verified that I am speaking with the correct person using two identifiers.   I discussed the limitations, risks, security and privacy concerns of performing an evaluation and management service by telephone and the availability of in person appointments. I also discussed with the patient that there may be a patient responsible charge related to this service. The patient expressed understanding and agreed to proceed.  Subjective:  Makayla Fletcher is a 23 y.o. G3P0020 at [redacted]w[redacted]d being followed for ongoing prenatal care.  She is currently monitored for the following issues for this low-risk pregnancy and has Depression, major, single episode, moderate (HCC); Generalized anxiety disorder; Sickle cell anemia (HCC); Sickle cell-hemoglobin C disease without crisis (HCC); Migraine without aura; Vitamin D deficiency; Supervision of normal first pregnancy, antepartum; and Maternal sickle cell anemia affecting pregnancy, antepartum (HCC) on their problem list.  Patient reports no complaints. Reports fetal movement. Denies any contractions, bleeding or leaking of fluid.   The following portions of the patient's history were reviewed and updated as appropriate: allergies, current medications, past family history, past medical history, past social history, past surgical history and problem list.   Objective:   General:  Alert, oriented and cooperative.   Mental Status: Normal mood and affect perceived. Normal judgment and thought content.  Rest of physical exam deferred due to type of encounter  Assessment and Plan:  Pregnancy: G3P0020 at [redacted]w[redacted]d 1. Supervision of normal first pregnancy, antepartum Routine care. Unsure about BC. No constipation. Told okay to use otc meds for hemorrhoids - Babyscripts Schedule Optimization  2. Intractable migraine without  aura and without status migrainosus No issues  3. Sickle cell-hemoglobin C disease without crisis (HCC) Will have her come in for surveillance ucx  Preterm labor symptoms and general obstetric precautions including but not limited to vaginal bleeding, contractions, leaking of fluid and fetal movement were reviewed in detail with the patient.  I discussed the assessment and treatment plan with the patient. The patient was provided an opportunity to ask questions and all were answered. The patient agreed with the plan and demonstrated an understanding of the instructions. The patient was advised to call back or seek an in-person office evaluation/go to [facility] at [facility] for any urgent or concerning symptoms. Please refer to After Visit Summary for other counseling recommendations.   I provided 10 minutes of non-face-to-face time during this encounter.  No follow-ups on file.  Future Appointments  Date Time Provider Department Center  10/12/2018  3:15 PM [facility] [place] 4 [facility]  01/23/2019  2:00 PM Mike Gip, FNP [facility] None    [place] Bing, MD [facility] for [facility], [facility]

## 2018-10-06 NOTE — Telephone Encounter (Signed)
-----   Message from [place] Bing, MD sent at 10/06/2018  8:25 AM EDT ----- Regarding: needs lab only visit for urine culture b/c she has sickle cell trait thanks

## 2018-10-10 ENCOUNTER — Other Ambulatory Visit: Payer: Self-pay

## 2018-10-10 ENCOUNTER — Other Ambulatory Visit: Payer: 59

## 2018-10-10 DIAGNOSIS — R300 Dysuria: Secondary | ICD-10-CM

## 2018-10-11 ENCOUNTER — Encounter: Payer: 59 | Admitting: Obstetrics and Gynecology

## 2018-10-12 ENCOUNTER — Ambulatory Visit (HOSPITAL_COMMUNITY)
Admission: RE | Admit: 2018-10-12 | Discharge: 2018-10-12 | Disposition: A | Discharge status: Home / Self Care | Payer: 59 | Source: Ambulatory Visit | Attending: Obstetrics and Gynecology | Admitting: Obstetrics and Gynecology

## 2018-10-12 ENCOUNTER — Other Ambulatory Visit: Payer: Self-pay

## 2018-10-12 DIAGNOSIS — O99019 Anemia complicating pregnancy, unspecified trimester: Secondary | ICD-10-CM | POA: Diagnosis present

## 2018-10-12 DIAGNOSIS — O99012 Anemia complicating pregnancy, second trimester: Secondary | ICD-10-CM

## 2018-10-12 DIAGNOSIS — D571 Sickle-cell disease without crisis: Secondary | ICD-10-CM

## 2018-10-12 DIAGNOSIS — Z362 Encounter for other antenatal screening follow-up: Secondary | ICD-10-CM | POA: Diagnosis not present

## 2018-10-12 DIAGNOSIS — Z3A33 33 weeks gestation of pregnancy: Secondary | ICD-10-CM | POA: Diagnosis not present

## 2018-10-12 LAB — URINE CULTURE, OB REFLEX

## 2018-10-12 LAB — CULTURE, OB URINE

## 2018-10-13 ENCOUNTER — Other Ambulatory Visit (HOSPITAL_COMMUNITY): Payer: Self-pay | Admitting: *Deleted

## 2018-10-13 ENCOUNTER — Telehealth: Payer: Self-pay | Admitting: *Deleted

## 2018-10-13 DIAGNOSIS — D571 Sickle-cell disease without crisis: Secondary | ICD-10-CM

## 2018-10-13 DIAGNOSIS — O99013 Anemia complicating pregnancy, third trimester: Principal | ICD-10-CM

## 2018-10-13 NOTE — Telephone Encounter (Signed)
Anusol HS supp # 12  Refill x 1 1 per rectum bid

## 2018-10-13 NOTE — Telephone Encounter (Signed)
Ok to send in Rx for Anusol HC supp 1 per rectum bid  # 12  RF x 1

## 2018-10-13 NOTE — Telephone Encounter (Signed)
Pt called to office with concerns about her hemorrhoids. Pt ha been using OTC products as instructed with little to no relief.  Pt states she is having some mild problems with bowel movements.  Pt states she is now having bleeding from hemorrhoids and wants to know what to do. Pt advised to continue OTC tx and may add stool softener and/or Miralax, increase fluids.  Pt made aware msg to be sent to provider for further recommendations.    Please advise any other tx options.

## 2018-10-14 ENCOUNTER — Other Ambulatory Visit: Payer: Self-pay | Admitting: *Deleted

## 2018-10-14 DIAGNOSIS — Z3400 Encounter for supervision of normal first pregnancy, unspecified trimester: Secondary | ICD-10-CM

## 2018-10-14 MED ORDER — HYDROCORTISONE ACETATE 25 MG RE SUPP: 25.0000 mg | Freq: Two times a day (BID) | RECTAL | 1 refills | Status: DC

## 2018-10-14 NOTE — Telephone Encounter (Signed)
Rx sent, pt aware 

## 2018-10-14 NOTE — Progress Notes (Signed)
anusol Rx sent per order. See phone note. Pt aware.

## 2018-10-19 ENCOUNTER — Encounter: Payer: Self-pay | Admitting: Obstetrics

## 2018-10-19 ENCOUNTER — Ambulatory Visit (INDEPENDENT_AMBULATORY_CARE_PROVIDER_SITE_OTHER): Payer: 59 | Admitting: Obstetrics

## 2018-10-19 ENCOUNTER — Other Ambulatory Visit: Payer: Self-pay

## 2018-10-19 VITALS — BP 109/77

## 2018-10-19 DIAGNOSIS — Z3A34 34 weeks gestation of pregnancy: Secondary | ICD-10-CM

## 2018-10-19 DIAGNOSIS — O99019 Anemia complicating pregnancy, unspecified trimester: Principal | ICD-10-CM

## 2018-10-19 DIAGNOSIS — D571 Sickle-cell disease without crisis: Secondary | ICD-10-CM

## 2018-10-19 DIAGNOSIS — O99013 Anemia complicating pregnancy, third trimester: Secondary | ICD-10-CM

## 2018-10-19 NOTE — Progress Notes (Signed)
   PRENATAL VISIT NOTE TELEHEALTH VIRTUAL OBSTETRICS VISIT ENCOUNTER NOTE  I connected with@ on 10/19/18 at  3:00 PM EDT by Webex at home and verified that I am speaking with the correct person using two identifiers.   I discussed the limitations, risks, security and privacy concerns of performing an evaluation and management service by telephone and the availability of in person appointments. I also discussed with the patient that there may be a patient responsible charge related to this service. The patient expressed understanding and agreed to proceed. Subjective:  Makayla Fletcher is a 23 y.o. G3P0020 at [redacted]w[redacted]d being seen today for ongoing prenatal care.  She is currently monitored for the following issues for this high-risk pregnancy and has Depression, major, single episode, moderate (HCC); Generalized anxiety disorder; Sickle cell anemia (HCC); Sickle cell-hemoglobin C disease without crisis (HCC); Migraine without aura; Vitamin D deficiency; Supervision of normal first pregnancy, antepartum; and Maternal sickle cell anemia affecting pregnancy, antepartum (HCC) on their problem list.  Patient reports no complaints.  Reports fetal movement. Contractions: Not present. Vag. Bleeding: None.  Movement: Present. Denies any contractions, bleeding or leaking of fluid.   The following portions of the patient's history were reviewed and updated as appropriate: allergies, current medications, past family history, past medical history, past social history, past surgical history and problem list.   Objective:   Vitals:   10/19/18 1518  BP: 109/77    Fetal Status:     Movement: Present     General:  Alert, oriented and cooperative. Patient is in no acute distress.  Respiratory: Normal respiratory effort, no problems with respiration noted  Mental Status: Normal mood and affect. Normal behavior. Normal judgment and thought content.  Rest of physical exam deferred due to type of encounter   Assessment and Plan:  Pregnancy: G3P0020 at [redacted]w[redacted]d 1. Sickle cell anemia of mother during pregnancy [facility]) Rx:  - [place] MFM OB LIMITED; Future - [place] MFM OB LIMITED; Future - [place] MFM OB LIMITED; Future - [place] MFM OB LIMITED; Future - [place] MFM OB LIMITED; Future  Preterm labor symptoms and general obstetric precautions including but not limited to vaginal bleeding, contractions, leaking of fluid and fetal movement were reviewed in detail with the patient. I discussed the assessment and treatment plan with the patient. The patient was provided an opportunity to ask questions and all were answered. The patient agreed with the plan and demonstrated an understanding of the instructions. The patient was advised to call back or seek an in-person office evaluation/go to MAU at [facility] for any urgent or concerning symptoms. Please refer to After Visit Summary for other counseling recommendations.  I provided 15 minutes of non-face-to-face time during this encounter. Return in about 1 week (around 10/26/2018) for ROB.  NST Weekly.  Future Appointments  Date Time Provider Department Center  11/10/2018  1:45 PM [facility]-MFC [place] 2 WH-MFCUS MFC-US  01/23/2019  2:00 PM Mike Gip, FNP [facility] None    Coral Ceo, MD Center for [facility], [facility] 10-19-2018

## 2018-10-19 NOTE — Progress Notes (Signed)
Pt is on the phone preparing for Webex visit with provider. [redacted]w[redacted]d.

## 2018-10-27 ENCOUNTER — Ambulatory Visit (HOSPITAL_COMMUNITY): Admission: RE | Admit: 2018-10-27 | Payer: 59 | Source: Ambulatory Visit | Attending: Obstetrics | Admitting: Obstetrics

## 2018-10-28 ENCOUNTER — Other Ambulatory Visit (HOSPITAL_COMMUNITY)
Admission: RE | Admit: 2018-10-28 | Discharge: 2018-10-28 | Disposition: A | Discharge status: Home / Self Care | Payer: 59 | Source: Ambulatory Visit | Attending: Obstetrics and Gynecology | Admitting: Obstetrics and Gynecology

## 2018-10-28 ENCOUNTER — Other Ambulatory Visit: Payer: Self-pay

## 2018-10-28 ENCOUNTER — Encounter: Payer: Self-pay | Admitting: Obstetrics and Gynecology

## 2018-10-28 ENCOUNTER — Ambulatory Visit (INDEPENDENT_AMBULATORY_CARE_PROVIDER_SITE_OTHER): Payer: 59 | Admitting: Obstetrics and Gynecology

## 2018-10-28 VITALS — BP 125/82 | HR 121 | Wt 140.5 lb

## 2018-10-28 DIAGNOSIS — O99019 Anemia complicating pregnancy, unspecified trimester: Secondary | ICD-10-CM

## 2018-10-28 DIAGNOSIS — Z3A35 35 weeks gestation of pregnancy: Secondary | ICD-10-CM

## 2018-10-28 DIAGNOSIS — O99013 Anemia complicating pregnancy, third trimester: Secondary | ICD-10-CM

## 2018-10-28 DIAGNOSIS — D571 Sickle-cell disease without crisis: Secondary | ICD-10-CM

## 2018-10-28 DIAGNOSIS — Z3400 Encounter for supervision of normal first pregnancy, unspecified trimester: Secondary | ICD-10-CM

## 2018-10-28 NOTE — Progress Notes (Signed)
   PRENATAL VISIT NOTE  Subjective:  Makayla Fletcher is a 23 y.o. G3P0020 at [redacted]w[redacted]d being seen today for ongoing prenatal care.  She is currently monitored for the following issues for this high-risk pregnancy and has Depression, major, single episode, moderate (HCC); Generalized anxiety disorder; Sickle cell anemia (HCC); Sickle cell-hemoglobin C disease without crisis (HCC); Migraine without aura; Vitamin D deficiency; Supervision of normal first pregnancy, antepartum; and Maternal sickle cell anemia affecting pregnancy, antepartum (HCC) on their problem list.  Patient reports no complaints.  Contractions: Not present. Vag. Bleeding: None.  Movement: Present. Denies leaking of fluid.   The following portions of the patient's history were reviewed and updated as appropriate: allergies, current medications, past family history, past medical history, past social history, past surgical history and problem list.   Objective:   Vitals:   10/28/18 0946  BP: 125/82  Pulse: (!) 121  Weight: 140 lb 8 oz (63.7 kg)    Fetal Status: Fetal Heart Rate (bpm): NST   Movement: Present     General:  Alert, oriented and cooperative. Patient is in no acute distress.  Skin: Skin is warm and dry. No rash noted.   Cardiovascular: Normal heart rate noted  Respiratory: Normal respiratory effort, no problems with respiration noted  Abdomen: Soft, gravid, appropriate for gestational age.  Pain/Pressure: Absent     Pelvic: Cervical exam deferred        Extremities: Normal range of motion.  Edema: None  Mental Status: Normal mood and affect. Normal behavior. Normal judgment and thought content.   Assessment and Plan:  Pregnancy: G3P0020 at [redacted]w[redacted]d  1. Supervision of normal first pregnancy, antepartum  2. Maternal sickle cell anemia affecting pregnancy, antepartum (HCC) - doing well, no issues, uses pain meds as needed but not often - [place] MFM FETAL BPP WO NON STRESS; Future - reactive NST today  Preterm  labor symptoms and general obstetric precautions including but not limited to vaginal bleeding, contractions, leaking of fluid and fetal movement were reviewed in detail with the patient. Please refer to After Visit Summary for other counseling recommendations.   Return in about 1 week (around 11/04/2018) for OB visit (MD), virtual.  Future Appointments  Date Time Provider Department Center  11/03/2018  1:45 PM [facility]-M[facility] [place] 2 WH-MFCUS MFC-US  11/04/2018 11:00 AM Brock Bad, MD [facility] None  01/23/2019  2:00 PM Mike Gip, FNP [facility] None    Conan Bowens, MD

## 2018-10-28 NOTE — Progress Notes (Signed)
Pt is here for ROB/NST. [redacted]w[redacted]d.

## 2018-11-01 LAB — CERVICOVAGINAL ANCILLARY ONLY
Chlamydia: NEGATIVE
Neisseria Gonorrhea: NEGATIVE

## 2018-11-01 LAB — CULTURE, BETA STREP (GROUP B ONLY): Strep Gp B Culture: NEGATIVE

## 2018-11-02 ENCOUNTER — Telehealth: Payer: Self-pay

## 2018-11-02 NOTE — Telephone Encounter (Signed)
S/w pt about out of work dates, pt advised 8 weeks after delivery.

## 2018-11-03 ENCOUNTER — Other Ambulatory Visit: Payer: Self-pay

## 2018-11-03 ENCOUNTER — Ambulatory Visit (HOSPITAL_COMMUNITY)
Admission: RE | Admit: 2018-11-03 | Discharge: 2018-11-03 | Disposition: A | Discharge status: Home / Self Care | Payer: 59 | Source: Ambulatory Visit | Attending: Obstetrics and Gynecology | Admitting: Obstetrics and Gynecology

## 2018-11-03 DIAGNOSIS — Z3A36 36 weeks gestation of pregnancy: Secondary | ICD-10-CM

## 2018-11-03 DIAGNOSIS — O99012 Anemia complicating pregnancy, second trimester: Secondary | ICD-10-CM

## 2018-11-03 DIAGNOSIS — Z362 Encounter for other antenatal screening follow-up: Secondary | ICD-10-CM

## 2018-11-03 DIAGNOSIS — Z3400 Encounter for supervision of normal first pregnancy, unspecified trimester: Secondary | ICD-10-CM | POA: Diagnosis present

## 2018-11-03 DIAGNOSIS — O99013 Anemia complicating pregnancy, third trimester: Secondary | ICD-10-CM | POA: Diagnosis present

## 2018-11-03 DIAGNOSIS — D571 Sickle-cell disease without crisis: Secondary | ICD-10-CM | POA: Diagnosis present

## 2018-11-04 ENCOUNTER — Other Ambulatory Visit (HOSPITAL_COMMUNITY): Payer: Self-pay | Admitting: *Deleted

## 2018-11-04 ENCOUNTER — Ambulatory Visit (INDEPENDENT_AMBULATORY_CARE_PROVIDER_SITE_OTHER): Payer: 59 | Admitting: Obstetrics

## 2018-11-04 ENCOUNTER — Encounter: Payer: Self-pay | Admitting: Obstetrics

## 2018-11-04 VITALS — BP 104/75

## 2018-11-04 DIAGNOSIS — D571 Sickle-cell disease without crisis: Secondary | ICD-10-CM

## 2018-11-04 DIAGNOSIS — O0990 Supervision of high risk pregnancy, unspecified, unspecified trimester: Secondary | ICD-10-CM

## 2018-11-04 DIAGNOSIS — O0993 Supervision of high risk pregnancy, unspecified, third trimester: Secondary | ICD-10-CM

## 2018-11-04 DIAGNOSIS — O99013 Anemia complicating pregnancy, third trimester: Secondary | ICD-10-CM

## 2018-11-04 DIAGNOSIS — Z3A36 36 weeks gestation of pregnancy: Secondary | ICD-10-CM

## 2018-11-04 NOTE — Progress Notes (Signed)
   TELEHEALTH VIRTUAL OBSTETRICS PRENATAL VISIT ENCOUNTER NOTE  I connected with Makayla Fletcher on 11/04/18 at 11:00 AM EDT by WebEx at home and verified that I am speaking with the correct person using two identifiers.   I discussed the limitations, risks, security and privacy concerns of performing an evaluation and management service by telephone and the availability of in person appointments. I also discussed with the patient that there may be a patient responsible charge related to this service. The patient expressed understanding and agreed to proceed. Subjective:  Makayla Fletcher is a 23 y.o. G3P0020 at [redacted]w[redacted]d being seen today for ongoing prenatal care.  She is currently monitored for the following issues for this high-risk pregnancy and has Depression, major, single episode, moderate (HCC); Generalized anxiety disorder; Sickle cell anemia (HCC); Sickle cell-hemoglobin C disease without crisis (HCC); Migraine without aura; Vitamin D deficiency; Supervision of normal first pregnancy, antepartum; and Maternal sickle cell anemia affecting pregnancy, antepartum (HCC) on their problem list.  Patient reports no complaints.  Reports fetal movement. Contractions: Irritability. Vag. Bleeding: None.  Movement: Present. Denies any contractions, bleeding or leaking of fluid.   The following portions of the patient's history were reviewed and updated as appropriate: allergies, current medications, past family history, past medical history, past social history, past surgical history and problem list.   Objective:   Vitals:   11/04/18 1103  BP: 104/75    Fetal Status:     Movement: Present     General:  Alert, oriented and cooperative. Patient is in no acute distress.  Respiratory: Normal respiratory effort, no problems with respiration noted  Mental Status: Normal mood and affect. Normal behavior. Normal judgment and thought content.  Rest of physical exam deferred due to type of encounter   Assessment and Plan:  Pregnancy: G3P0020 at [redacted]w[redacted]d  1. Supervision of high risk pregnancy, antepartum  2. Maternal sickle cell anemia affecting pregnancy, antepartum (HCC)  Follow up in 1 week  There are no diagnoses linked to this encounter. Preterm labor symptoms and general obstetric precautions including but not limited to vaginal bleeding, contractions, leaking of fluid and fetal movement were reviewed in detail with the patient. I discussed the assessment and treatment plan with the patient. The patient was provided an opportunity to ask questions and all were answered. The patient agreed with the plan and demonstrated an understanding of the instructions. The patient was advised to call back or seek an in-person office evaluation/go to MAU at [facility] for any urgent or concerning symptoms. Please refer to After Visit Summary for other counseling recommendations.   I provided 10 minutes of face-to-face via WebEx time during this encounter.    Future Appointments  Date Time Provider Department Center  11/10/2018  1:45 PM [facility] NURSE WH-MFC MFC-US  11/10/2018  1:45 PM WH-MFC [place] 2 WH-MFCUS MFC-US  11/17/2018  1:45 PM WH-MFC NURSE WH-MFC MFC-US  11/17/2018  1:45 PM WH-MFC [place] 5 WH-MFCUS MFC-US  11/24/2018  1:45 PM WH-MFC NURSE WH-MFC MFC-US  11/24/2018  1:45 PM WH-MFC [place] 5 WH-MFCUS MFC-US  01/23/2019  2:00 PM Mike Gip, FNP [facility] None    Coral Ceo, MD Center for [facility], [facility] 11-04-2018

## 2018-11-04 NOTE — Progress Notes (Signed)
Pt presents for webex visit. Pt identified with two pt identifiers. She is currently [redacted]w[redacted]d. Pt bp today is 104/75. Pt does not have any concerns.

## 2018-11-07 ENCOUNTER — Telehealth: Payer: Self-pay | Admitting: *Deleted

## 2018-11-07 NOTE — Telephone Encounter (Signed)
Pt called to office asking about date she is to be induced. Pt states she has been given 2 different dates by providers. Mychart message has been previously sent.  Attempt to return call to pt. No answer, LM on VM making pt aware no induction date noted in chart.  Pt needs OB appt scheduled and was advised to call and schedule.  Advised she may discuss induction with provider at next visit.

## 2018-11-10 ENCOUNTER — Ambulatory Visit (HOSPITAL_COMMUNITY)
Admission: RE | Admit: 2018-11-10 | Discharge: 2018-11-10 | Disposition: A | Discharge status: Home / Self Care | Payer: 59 | Source: Ambulatory Visit | Attending: Obstetrics and Gynecology | Admitting: Obstetrics and Gynecology

## 2018-11-10 ENCOUNTER — Encounter: Payer: Self-pay | Admitting: Obstetrics and Gynecology

## 2018-11-10 ENCOUNTER — Ambulatory Visit (INDEPENDENT_AMBULATORY_CARE_PROVIDER_SITE_OTHER): Payer: 59 | Admitting: Obstetrics and Gynecology

## 2018-11-10 ENCOUNTER — Encounter (HOSPITAL_COMMUNITY): Payer: Self-pay

## 2018-11-10 ENCOUNTER — Other Ambulatory Visit: Payer: Self-pay

## 2018-11-10 ENCOUNTER — Ambulatory Visit (HOSPITAL_COMMUNITY): Payer: 59

## 2018-11-10 ENCOUNTER — Ambulatory Visit (HOSPITAL_COMMUNITY): Payer: 59 | Admitting: *Deleted

## 2018-11-10 VITALS — BP 121/74

## 2018-11-10 DIAGNOSIS — O99013 Anemia complicating pregnancy, third trimester: Secondary | ICD-10-CM | POA: Diagnosis not present

## 2018-11-10 DIAGNOSIS — Z3400 Encounter for supervision of normal first pregnancy, unspecified trimester: Secondary | ICD-10-CM

## 2018-11-10 DIAGNOSIS — D571 Sickle-cell disease without crisis: Secondary | ICD-10-CM

## 2018-11-10 DIAGNOSIS — Z3A37 37 weeks gestation of pregnancy: Secondary | ICD-10-CM

## 2018-11-10 DIAGNOSIS — O99019 Anemia complicating pregnancy, unspecified trimester: Secondary | ICD-10-CM | POA: Diagnosis present

## 2018-11-10 NOTE — Progress Notes (Signed)
Pt is on the phone for virtual visit with provider. [redacted]w[redacted]d.

## 2018-11-10 NOTE — Progress Notes (Signed)
   TELEHEALTH OBSTETRICS PRENATAL VIRTUAL VIDEO VISIT ENCOUNTER NOTE  Provider location: Center for [facility] [place]   I connected with [name] on 11/10/18 at  4:00 PM EDT by WebEx Video Encounter at home and verified that I am speaking with the correct person using two identifiers.   I discussed the limitations, risks, security and privacy concerns of performing an evaluation and management service by telephone and the availability of in person appointments. I also discussed with the patient that there may be a patient responsible charge related to this service. The patient expressed understanding and agreed to proceed. Subjective:  Makayla Fletcher is a 23 y.o. G3P0020 at 109w3d being seen today for ongoing prenatal care.  She is currently monitored for the following issues for this high-risk pregnancy and has Depression, major, single episode, moderate (HCC); Generalized anxiety disorder; Sickle cell anemia (HCC); Sickle cell-hemoglobin C disease without crisis (HCC); Migraine without aura; Vitamin D deficiency; Supervision of normal first pregnancy, antepartum; and Maternal sickle cell anemia affecting pregnancy, antepartum (HCC) on their problem list.  Patient reports no complaints.  Contractions: Irritability. Vag. Bleeding: None.  Movement: Present. Denies any leaking of fluid.   The following portions of the patient's history were reviewed and updated as appropriate: allergies, current medications, past family history, past medical history, past social history, past surgical history and problem list.   Objective:   Vitals:   11/10/18 1536  BP: 121/74    Fetal Status:     Movement: Present     General:  Alert, oriented and cooperative. Patient is in no acute distress.  Respiratory: Normal respiratory effort, no problems with respiration noted  Mental Status: Normal mood and affect. Normal behavior. Normal judgment and thought content.  Rest of physical exam  deferred due to type of encounter  Assessment and Plan:  Pregnancy: G3P0020 at [redacted]w[redacted]d  1. Supervision of normal first pregnancy, antepartum  2. Maternal sickle cell anemia affecting pregnancy, antepartum (HCC) Doing well, no issues BPP 8/8 IOL at 39 weeks requested today orders for induction placed  Term labor symptoms and general obstetric precautions including but not limited to vaginal bleeding, contractions, leaking of fluid and fetal movement were reviewed in detail with the patient. I discussed the assessment and treatment plan with the patient. The patient was provided an opportunity to ask questions and all were answered. The patient agreed with the plan and demonstrated an understanding of the instructions. The patient was advised to call back or seek an in-person office evaluation/go to MAU at [facility] for any urgent or concerning symptoms. Please refer to After Visit Summary for other counseling recommendations.   I provided 16 minutes of face-to-face time during this encounter.  Return in about 1 week (around 11/17/2018) for OB visit (MD), virtual.  Future Appointments  Date Time Provider Department Center  11/17/2018  1:45 PM [facility] NURSE WH-MFC MFC-US  11/17/2018  1:45 PM WH-MFC [place] 5 WH-MFCUS MFC-US  11/24/2018  1:45 PM [facility]-MFC NURSE WH-MFC MFC-US  11/24/2018  1:45 PM WH-MFC [place] 5 WH-MFCUS MFC-US  01/23/2019  2:00 PM Mike Gip, FNP [facility] None    Conan Bowens, MD Center for [facility], [facility]

## 2018-11-11 ENCOUNTER — Telehealth (HOSPITAL_COMMUNITY): Payer: Self-pay | Admitting: *Deleted

## 2018-11-11 ENCOUNTER — Encounter: Payer: 59 | Admitting: Obstetrics

## 2018-11-11 ENCOUNTER — Encounter (HOSPITAL_COMMUNITY): Payer: Self-pay | Admitting: *Deleted

## 2018-11-11 NOTE — Telephone Encounter (Signed)
Preadmission screen  

## 2018-11-15 DIAGNOSIS — Z3483 Encounter for supervision of other normal pregnancy, third trimester: Secondary | ICD-10-CM

## 2018-11-16 ENCOUNTER — Other Ambulatory Visit: Payer: Self-pay | Admitting: Advanced Practice Midwife

## 2018-11-16 ENCOUNTER — Encounter: Payer: Self-pay | Admitting: Obstetrics and Gynecology

## 2018-11-16 ENCOUNTER — Ambulatory Visit (INDEPENDENT_AMBULATORY_CARE_PROVIDER_SITE_OTHER): Payer: 59 | Admitting: Obstetrics and Gynecology

## 2018-11-16 ENCOUNTER — Telehealth: Payer: Self-pay

## 2018-11-16 ENCOUNTER — Other Ambulatory Visit: Payer: Self-pay

## 2018-11-16 DIAGNOSIS — D5700 Hb-SS disease with crisis, unspecified: Secondary | ICD-10-CM

## 2018-11-16 DIAGNOSIS — Z3400 Encounter for supervision of normal first pregnancy, unspecified trimester: Secondary | ICD-10-CM

## 2018-11-16 DIAGNOSIS — Z3403 Encounter for supervision of normal first pregnancy, third trimester: Secondary | ICD-10-CM

## 2018-11-16 DIAGNOSIS — Z3A38 38 weeks gestation of pregnancy: Secondary | ICD-10-CM

## 2018-11-16 NOTE — Telephone Encounter (Signed)
Received duplicate [facility] paperwork. TC to pt to confirm. Pt states she spoke with them yesterday and they've received the paperwork already.

## 2018-11-16 NOTE — Progress Notes (Signed)
TELEHEALTH OBSTETRICS PRENATAL VIRTUAL VIDEO VISIT ENCOUNTER NOTE  Provider location: Center for [facility] [place]   I connected with Makayla Fletcher on 11/16/18 at  4:00 PM EDT by WebEx Video Encounter at home and verified that I am speaking with the correct person using two identifiers.   I discussed the limitations, risks, security and privacy concerns of performing an evaluation and management service by telephone and the availability of in person appointments. I also discussed with the patient that there may be a patient responsible charge related to this service. The patient expressed understanding and agreed to proceed. Subjective:  Makayla Fletcher is a 23 y.o. G3P0020 at [redacted]w[redacted]d being seen today for ongoing prenatal care.  She is currently monitored for the following issues for this high-risk pregnancy and has Depression, major, single episode, moderate (HCC); Generalized anxiety disorder; Sickle cell anemia (HCC); Sickle cell-hemoglobin C disease without crisis (HCC); Migraine without aura; Vitamin D deficiency; Supervision of normal first pregnancy, antepartum; and Maternal sickle cell anemia affecting pregnancy, antepartum (HCC) on their problem list.  Patient reports no complaints.  Contractions: Irritability. Vag. Bleeding: None.  Movement: Present. Denies any leaking of fluid.   The following portions of the patient's history were reviewed and updated as appropriate: allergies, current medications, past family history, past medical history, past social history, past surgical history and problem list.   Objective:  There were no vitals filed for this visit.  Fetal Status:     Movement: Present     General:  Alert, oriented and cooperative. Patient is in no acute distress.  Respiratory: Normal respiratory effort, no problems with respiration noted  Mental Status: Normal mood and affect. Normal behavior. Normal judgment and thought content.  Rest of physical exam  deferred due to type of encounter  Imaging: [place] Mfm Fetal Bpp Wo Non Stress  Result Date: 11/10/2018 ----------------------------------------------------------------------  OBSTETRICS REPORT                       (Signed Final 11/10/2018 03:01 pm) ---------------------------------------------------------------------- Patient Info  ID #:       [id number]                          D.O.B.:  [birth year] (23 yrs)  Name:       Makayla Fletcher               Visit Date: 11/10/2018 01:42 pm ---------------------------------------------------------------------- Performed By  Performed By:     Sandi Mealy        Ref. Address:     Faculty                    RDMS  Attending:        Noralee Space MD        [facility]  Referred By:      Catalina Antigua MD ---------------------------------------------------------------------- Orders   #  Description                          Code         Ordered By   1  [place] MFM FETAL BPP WO NON              [id number]     RAVI [facility]      STRESS  ----------------------------------------------------------------------   #  Order #                    Accession #                 Episode #   1  [id number]                  [id number]                  [id number]  ---------------------------------------------------------------------- Indications   Maternal sickle cell anemia, third trimester   O99.013, D57.1   (no crisis in over 4 years)(Low Risk NIPS)   (AFP Negative)   [redacted] weeks gestation of pregnancy                Z3A.37  ---------------------------------------------------------------------- Vital Signs  Weight (lb): 140                               Height:        5'2"  BMI:         25.6 ---------------------------------------------------------------------- Fetal Evaluation  Num Of Fetuses:         1  Fetal Heart Rate(bpm):  148  Cardiac Activity:       Observed  Presentation:            Cephalic  Placenta:               Fundal  P. Cord Insertion:      Previously Visualized  Amniotic Fluid  AFI FV:      Within normal limits  AFI Sum(cm)     %Tile       Largest Pocket(cm)  10.04           25          3.51  RUQ(cm)       RLQ(cm)       LUQ(cm)        LLQ(cm)  3.51          2.34          2.07           2.12 ---------------------------------------------------------------------- Biophysical Evaluation  Amniotic F.V:   Within normal limits       F. Tone:        Observed  F. Movement:    Observed                   Score:          8/8  F. Breathing:   Observed ---------------------------------------------------------------------- Biometry  LV:        3.1  mm ---------------------------------------------------------------------- OB History  Gravidity:    3         Term:   0         SAB:   2  Living:       0 ---------------------------------------------------------------------- Gestational Age  LMP:           37w 3d  Date:  02/21/18                 EDD:   11/28/18  Best:          [name] 3d     Det. By:  LMP  (02/21/18)          EDD:   11/28/18 ---------------------------------------------------------------------- Anatomy  Cranium:               Appears normal         LVOT:                   Previously seen  Cavum:                 Appears normal         Aortic Arch:            Appears normal  Ventricles:            Appears normal         Ductal Arch:            Previously seen  Choroid Plexus:        Previously seen        Diaphragm:              Appears normal  Cerebellum:            Previously seen        Stomach:                Appears normal, left                                                                        sided  Posterior Fossa:       Previously seen        Abdomen:                Appears normal  Nuchal Fold:           Previously seen        Abdominal Wall:         Previously seen  Face:                  Appears normal         Cord Vessels:           Previously seen                          (orbits and profile)  Lips:                  Appears normal         Kidneys:                Appear normal  Palate:                Previously seen        Bladder:                Appears normal  Thoracic:              Appears normal         Fletcher:  Previously seen  Heart:                 Previously seen        Upper Extremities:      Previously seen  RVOT:                  Previously seen        Lower Extremities:      Previously seen ---------------------------------------------------------------------- Impression  Amniotic fluid is normal and good fetal activity is seen.  Antenatal testing is reassuring. BPP 8/8. Cephalic  presentation. ---------------------------------------------------------------------- Recommendations  -Continue weekly BPP till delivery. ----------------------------------------------------------------------                  Noralee Space, MD Electronically Signed Final Report   11/10/2018 03:01 pm ----------------------------------------------------------------------  [place] Mfm Fetal Bpp Wo Non Stress  Result Date: 11/03/2018 ----------------------------------------------------------------------  OBSTETRICS REPORT                       (Signed Final 11/03/2018 05:09 pm) ---------------------------------------------------------------------- Patient Info  ID #:       [id number]                          D.O.B.:  06/11/1996 (22 yrs)  Name:       Makayla Fletcher               Visit Date: 11/03/2018 01:39 pm ---------------------------------------------------------------------- Performed By  Performed By:     Sandi Mealy        Ref. Address:     Faculty                    RDMS  Attending:        Noralee Space MD        [facility]  Referred By:      Catalina Antigua MD ---------------------------------------------------------------------- Orders   #  Description                           Code         Ordered By   1  [place] MFM OB FOLLOW UP                  40981.19     RAVI SHANKAR   2  [place] MFM FETAL BPP WO NON              14782.95     KELLY DAVIS      STRESS  ----------------------------------------------------------------------   #  Order #                    Accession #                 Episode #   1  [id number]  [id number]                  [id number]   2  [id number]                  [id number]                  [id number]  ---------------------------------------------------------------------- Indications   Encounter for other antenatal screening        Z36.2   follow-up   Maternal sickle cell anemia, second            O99.012, D57.1   trimester (no crisis in over 4 years)(Low Risk   NIPS) (AFP Negative)   [redacted] weeks gestation of pregnancy                Z3A.36  ---------------------------------------------------------------------- Vital Signs  Weight (lb): 140                               Height:        5'2"  BMI:         25.6 ---------------------------------------------------------------------- Fetal Evaluation  Num Of Fetuses:         1  Fetal Heart Rate(bpm):  132  Cardiac Activity:       Observed  Presentation:           Cephalic  Placenta:               Posterior  P. Cord Insertion:      Visualized  Amniotic Fluid  AFI FV:      Within normal limits  AFI Sum(cm)     %Tile       Largest Pocket(cm)  15.27           57          5.09  RUQ(cm)       RLQ(cm)       LUQ(cm)        LLQ(cm)  4.42          1.9           3.86           5.09 ---------------------------------------------------------------------- Biophysical Evaluation  Amniotic F.V:   Within normal limits       F. Tone:        Observed  F. Movement:    Observed                   Score:          8/8  F. Breathing:   Observed ---------------------------------------------------------------------- Biometry  BPD:      89.3  mm     G. Age:  36w 1d         53  %    CI:        74.81   %    70 - 86                                                           FL/HC:      21.7   %    20.1 - 22.1  HC:      327.6  mm     G. Age:  37w 2d  40  %    HC/AC:      0.94        0.93 - 1.11  AC:      348.5  mm     G. Age:  38w 5d       > 97  %    FL/BPD:     79.7   %    71 - 87  FL:       71.2  mm     G. Age:  36w 4d         48  %    FL/AC:      20.4   %    20 - 24  HUM:      62.7  mm     G. Age:  36w 3d         64  %  Est. FW:    3295  gm      7 lb 4 oz     87  % ---------------------------------------------------------------------- OB History  Gravidity:    3         Term:   0         SAB:   2  Living:       0 ---------------------------------------------------------------------- Gestational Age  LMP:           36w 3d        Date:  02/21/18                 EDD:   11/28/18  U/S Today:     37w 1d                                        EDD:   11/23/18  Best:          36w 3d     Det. By:  LMP  (02/21/18)          EDD:   11/28/18 ---------------------------------------------------------------------- Anatomy  Cranium:               Appears normal         Aortic Arch:            Previously seen  Cavum:                 Previously seen        Ductal Arch:            Previously seen  Ventricles:            Previously seen        Diaphragm:              Previously seen  Choroid Plexus:        Previously seen        Stomach:                Appears normal, left                                                                        sided  Cerebellum:            Previously seen  Abdomen:                Previously seen  Posterior Fossa:       Previously seen        Abdominal Wall:         Previously seen  Nuchal Fold:           Previously seen        Cord Vessels:           Previously seen  Face:                  Orbits and profile     Kidneys:                Appear normal                         previously seen  Lips:                  Previously seen        Bladder:                Appears normal  Thoracic:              Appears normal         Fletcher:                   Previously seen  Heart:                 Appears normal         Upper Extremities:      Previously seen                         (4CH, axis, and                         situs)  RVOT:                  Previously seen        Lower Extremities:      Previously seen  LVOT:                  Previously seen  Other:  Female gender Heels and 5th digit visualized previously. Nasal bone          visualized previously. Open hands visualized previously. ---------------------------------------------------------------------- Impression  Fetal growth is appropriate for gestational age. Amniotic fluid  is normal and good fetal activity is seen. Antenatal testing is  reassuring. BPP 8/8. ----------------------------------------------------------------------                  Noralee Space, MD Electronically Signed Final Report   11/03/2018 05:09 pm ----------------------------------------------------------------------  [place] Mfm Ob Follow Up  Result Date: 11/03/2018 ----------------------------------------------------------------------  OBSTETRICS REPORT                       (Signed Final 11/03/2018 05:09 pm) ---------------------------------------------------------------------- Patient Info  ID #:       [id number]                          D.O.B.:  [birth year] (22 yrs)  Name:       Makayla Fletcher               Visit Date: 11/03/2018 01:39 pm ---------------------------------------------------------------------- Performed By  Performed By:  Jovancia Adrien        Ref. Address:     Faculty                    RDMS  Attending:        Noralee Space MD        [facility]  Referred By:      Catalina Antigua MD ---------------------------------------------------------------------- Orders   #  Description                          Code         Ordered By   1  [place] MFM OB FOLLOW UP                  97353.29     RAVI SHANKAR    2  [place] MFM FETAL BPP WO NON              76819.01     KELLY DAVIS      STRESS  ----------------------------------------------------------------------   #  Order #                    Accession #                 Episode #   1  [id number]                  [id number]                  [id number]   2  [id number]                  [id number]                  [id number]  ---------------------------------------------------------------------- Indications   Encounter for other antenatal screening        Z36.2   follow-up   Maternal sickle cell anemia, second            O99.012, D57.1   trimester (no crisis in over 4 years)(Low Risk   NIPS) (AFP Negative)   [redacted] weeks gestation of pregnancy                Z3A.36  ---------------------------------------------------------------------- Vital Signs  Weight (lb): 140                               Height:        5'2"  BMI:         25.6 ---------------------------------------------------------------------- Fetal Evaluation  Num Of Fetuses:         1  Fetal Heart Rate(bpm):  132  Cardiac Activity:       Observed  Presentation:           Cephalic  Placenta:               Posterior  P. Cord Insertion:      Visualized  Amniotic Fluid  AFI FV:      Within normal limits  AFI Sum(cm)     %Tile       Largest Pocket(cm)  15.27           57          5.09  RUQ(cm)       RLQ(cm)       LUQ(cm)        LLQ(cm)  4.42          1.9           3.86           5.09 ---------------------------------------------------------------------- Biophysical Evaluation  Amniotic F.V:   Within normal limits       F. Tone:        Observed  F. Movement:    Observed                   Score:          8/8  F. Breathing:   Observed ---------------------------------------------------------------------- Biometry  BPD:      89.3  mm     G. Age:  36w 1d         53  %    CI:        74.81   %    70 - 86                                                          FL/HC:      21.7   %    20.1 - 22.1  HC:      327.6  mm     G. Age:  37w 2d          40  %    HC/AC:      0.94        0.93 - 1.11  AC:      348.5  mm     G. Age:  38w 5d       > 97  %    FL/BPD:     79.7   %    71 - 87  FL:       71.2  mm     G. Age:  36w 4d         48  %    FL/AC:      20.4   %    20 - 24  HUM:      62.7  mm     G. Age:  36w 3d         64  %  Est. FW:    3295  gm      7 lb 4 oz     87  % ---------------------------------------------------------------------- OB History  Gravidity:    3         Term:   0         SAB:   2  Living:       0 ---------------------------------------------------------------------- Gestational Age  LMP:           36w 3d        Date:  02/21/18  EDD:   11/28/18  U/S Today:     37w 1d                                        EDD:   11/23/18  Best:          36w 3d     Det. By:  LMP  (02/21/18)          EDD:   11/28/18 ---------------------------------------------------------------------- Anatomy  Cranium:               Appears normal         Aortic Arch:            Previously seen  Cavum:                 Previously seen        Ductal Arch:            Previously seen  Ventricles:            Previously seen        Diaphragm:              Previously seen  Choroid Plexus:        Previously seen        Stomach:                Appears normal, left                                                                        sided  Cerebellum:            Previously seen        Abdomen:                Previously seen  Posterior Fossa:       Previously seen        Abdominal Wall:         Previously seen  Nuchal Fold:           Previously seen        Cord Vessels:           Previously seen  Face:                  Orbits and profile     Kidneys:                Appear normal                         previously seen  Lips:                  Previously seen        Bladder:                Appears normal  Thoracic:              Appears normal         Fletcher:                  Previously seen  Heart:  Appears normal         Upper Extremities:      Previously  seen                         (4CH, axis, and                         situs)  RVOT:                  Previously seen        Lower Extremities:      Previously seen  LVOT:                  Previously seen  Other:  Female gender Heels and 5th digit visualized previously. Nasal bone          visualized previously. Open hands visualized previously. ---------------------------------------------------------------------- Impression  Fetal growth is appropriate for gestational age. Amniotic fluid  is normal and good fetal activity is seen. Antenatal testing is  reassuring. BPP 8/8. ----------------------------------------------------------------------                  Noralee Space, MD Electronically Signed Final Report   11/03/2018 05:09 pm ----------------------------------------------------------------------   Assessment and Plan:  Pregnancy: G3P0020 at [redacted]w[redacted]d 1. Supervision of normal first pregnancy, antepartum Patient is doing well without complaints  2. Hb-SS disease with crisis [facility]) Patient scheduled for IOL on 11/22/2018  Term labor symptoms and general obstetric precautions including but not limited to vaginal bleeding, contractions, leaking of fluid and fetal movement were reviewed in detail with the patient. I discussed the assessment and treatment plan with the patient. The patient was provided an opportunity to ask questions and all were answered. The patient agreed with the plan and demonstrated an understanding of the instructions. The patient was advised to call back or seek an in-person office evaluation/go to MAU at [facility] for any urgent or concerning symptoms. Please refer to After Visit Summary for other counseling recommendations.   I provided 11 minutes of face-to-face time during this encounter.  No follow-ups on file.  Future Appointments  Date Time Provider Department Center  11/17/2018  1:45 PM WH-MFC NURSE WH-MFC MFC-US  11/17/2018  1:45 PM WH-MFC [place] 5 WH-MFCUS  MFC-US  11/18/2018  8:10 AM MC-MAU 1 MC-INDC None  11/22/2018  7:00 AM MC-LD SCHED ROOM MC-INDC None  11/24/2018  1:45 PM WH-MFC NURSE WH-MFC MFC-US  11/24/2018  1:45 PM WH-MFC [place] 5 WH-MFCUS MFC-US  01/23/2019  2:00 PM Mike Gip, FNP [facility] None    Catalina Antigua, MD Center for [facility], [facility]

## 2018-11-16 NOTE — Progress Notes (Signed)
CC: NONE

## 2018-11-17 ENCOUNTER — Ambulatory Visit (HOSPITAL_COMMUNITY): Payer: 59 | Admitting: *Deleted

## 2018-11-17 ENCOUNTER — Ambulatory Visit (HOSPITAL_COMMUNITY)
Admission: RE | Admit: 2018-11-17 | Discharge: 2018-11-17 | Disposition: A | Discharge status: Home / Self Care | Payer: 59 | Source: Ambulatory Visit | Attending: Obstetrics and Gynecology | Admitting: Obstetrics and Gynecology

## 2018-11-17 ENCOUNTER — Encounter (HOSPITAL_COMMUNITY): Payer: Self-pay

## 2018-11-17 ENCOUNTER — Ambulatory Visit (HOSPITAL_COMMUNITY): Payer: 59

## 2018-11-17 VITALS — BP 117/74 | HR 90 | Temp 99.0°F

## 2018-11-17 DIAGNOSIS — Z3A38 38 weeks gestation of pregnancy: Secondary | ICD-10-CM | POA: Diagnosis not present

## 2018-11-17 DIAGNOSIS — O99013 Anemia complicating pregnancy, third trimester: Secondary | ICD-10-CM

## 2018-11-17 DIAGNOSIS — Z3400 Encounter for supervision of normal first pregnancy, unspecified trimester: Secondary | ICD-10-CM | POA: Insufficient documentation

## 2018-11-17 DIAGNOSIS — D571 Sickle-cell disease without crisis: Secondary | ICD-10-CM | POA: Insufficient documentation

## 2018-11-17 DIAGNOSIS — O99019 Anemia complicating pregnancy, unspecified trimester: Secondary | ICD-10-CM | POA: Diagnosis present

## 2018-11-18 ENCOUNTER — Other Ambulatory Visit: Payer: Self-pay

## 2018-11-18 ENCOUNTER — Other Ambulatory Visit (HOSPITAL_COMMUNITY)
Admission: RE | Admit: 2018-11-18 | Discharge: 2018-11-18 | Disposition: A | Discharge status: Home / Self Care | Payer: 59 | Source: Ambulatory Visit | Attending: Obstetrics and Gynecology | Admitting: Obstetrics and Gynecology

## 2018-11-18 DIAGNOSIS — Z1159 Encounter for screening for other viral diseases: Secondary | ICD-10-CM | POA: Diagnosis not present

## 2018-11-18 NOTE — MAU Note (Signed)
COVID swab collected. PT tolerated well. Pt asymptomatic 

## 2018-11-19 LAB — NOVEL CORONAVIRUS, NAA (HOSP ORDER, SEND-OUT TO REF LAB; TAT 18-24 HRS): SARS-CoV-2, NAA: NOT DETECTED

## 2018-11-21 ENCOUNTER — Other Ambulatory Visit (HOSPITAL_COMMUNITY): Payer: Self-pay | Admitting: *Deleted

## 2018-11-22 ENCOUNTER — Inpatient Hospital Stay (HOSPITAL_COMMUNITY)
Admission: AD | Admit: 2018-11-22 | Discharge: 2018-12-01 | DRG: 768 | Disposition: A | Discharge status: Home / Self Care | Payer: 59 | Attending: Family Medicine | Admitting: Family Medicine

## 2018-11-22 ENCOUNTER — Inpatient Hospital Stay (HOSPITAL_COMMUNITY): Payer: 59

## 2018-11-22 ENCOUNTER — Inpatient Hospital Stay (HOSPITAL_COMMUNITY): Payer: 59 | Admitting: Anesthesiology

## 2018-11-22 ENCOUNTER — Encounter (HOSPITAL_COMMUNITY): Payer: Self-pay | Admitting: *Deleted

## 2018-11-22 ENCOUNTER — Other Ambulatory Visit: Payer: Self-pay

## 2018-11-22 DIAGNOSIS — Z3490 Encounter for supervision of normal pregnancy, unspecified, unspecified trimester: Secondary | ICD-10-CM

## 2018-11-22 DIAGNOSIS — D571 Sickle-cell disease without crisis: Secondary | ICD-10-CM | POA: Diagnosis present

## 2018-11-22 DIAGNOSIS — J9601 Acute respiratory failure with hypoxia: Secondary | ICD-10-CM | POA: Diagnosis not present

## 2018-11-22 DIAGNOSIS — O9942 Diseases of the circulatory system complicating childbirth: Secondary | ICD-10-CM | POA: Diagnosis present

## 2018-11-22 DIAGNOSIS — K9189 Other postprocedural complications and disorders of digestive system: Secondary | ICD-10-CM | POA: Diagnosis not present

## 2018-11-22 DIAGNOSIS — R000 Tachycardia, unspecified: Secondary | ICD-10-CM | POA: Diagnosis not present

## 2018-11-22 DIAGNOSIS — J9600 Acute respiratory failure, unspecified whether with hypoxia or hypercapnia: Secondary | ICD-10-CM | POA: Diagnosis not present

## 2018-11-22 DIAGNOSIS — D5700 Hb-SS disease with crisis, unspecified: Secondary | ICD-10-CM | POA: Diagnosis not present

## 2018-11-22 DIAGNOSIS — G9340 Encephalopathy, unspecified: Secondary | ICD-10-CM | POA: Diagnosis not present

## 2018-11-22 DIAGNOSIS — Z3A39 39 weeks gestation of pregnancy: Secondary | ICD-10-CM

## 2018-11-22 DIAGNOSIS — O26833 Pregnancy related renal disease, third trimester: Secondary | ICD-10-CM | POA: Diagnosis present

## 2018-11-22 DIAGNOSIS — N179 Acute kidney failure, unspecified: Secondary | ICD-10-CM | POA: Diagnosis not present

## 2018-11-22 DIAGNOSIS — J810 Acute pulmonary edema: Secondary | ICD-10-CM | POA: Diagnosis not present

## 2018-11-22 DIAGNOSIS — O9089 Other complications of the puerperium, not elsewhere classified: Secondary | ICD-10-CM | POA: Diagnosis not present

## 2018-11-22 DIAGNOSIS — Z90711 Acquired absence of uterus with remaining cervical stump: Secondary | ICD-10-CM

## 2018-11-22 DIAGNOSIS — R578 Other shock: Secondary | ICD-10-CM | POA: Diagnosis not present

## 2018-11-22 DIAGNOSIS — O9902 Anemia complicating childbirth: Principal | ICD-10-CM | POA: Diagnosis present

## 2018-11-22 DIAGNOSIS — J690 Pneumonitis due to inhalation of food and vomit: Secondary | ICD-10-CM | POA: Diagnosis not present

## 2018-11-22 DIAGNOSIS — O9912 Other diseases of the blood and blood-forming organs and certain disorders involving the immune mechanism complicating childbirth: Secondary | ICD-10-CM | POA: Diagnosis present

## 2018-11-22 DIAGNOSIS — J9690 Respiratory failure, unspecified, unspecified whether with hypoxia or hypercapnia: Secondary | ICD-10-CM

## 2018-11-22 DIAGNOSIS — F411 Generalized anxiety disorder: Secondary | ICD-10-CM | POA: Diagnosis present

## 2018-11-22 DIAGNOSIS — R748 Abnormal levels of other serum enzymes: Secondary | ICD-10-CM | POA: Diagnosis not present

## 2018-11-22 DIAGNOSIS — O322XX0 Maternal care for transverse and oblique lie, not applicable or unspecified: Secondary | ICD-10-CM | POA: Diagnosis present

## 2018-11-22 DIAGNOSIS — D696 Thrombocytopenia, unspecified: Secondary | ICD-10-CM | POA: Diagnosis not present

## 2018-11-22 DIAGNOSIS — D65 Disseminated intravascular coagulation [defibrination syndrome]: Secondary | ICD-10-CM | POA: Diagnosis present

## 2018-11-22 DIAGNOSIS — D62 Acute posthemorrhagic anemia: Secondary | ICD-10-CM | POA: Diagnosis not present

## 2018-11-22 DIAGNOSIS — D5720 Sickle-cell/Hb-C disease without crisis: Secondary | ICD-10-CM | POA: Diagnosis present

## 2018-11-22 DIAGNOSIS — R571 Hypovolemic shock: Secondary | ICD-10-CM | POA: Diagnosis not present

## 2018-11-22 DIAGNOSIS — N170 Acute kidney failure with tubular necrosis: Secondary | ICD-10-CM | POA: Diagnosis not present

## 2018-11-22 DIAGNOSIS — F321 Major depressive disorder, single episode, moderate: Secondary | ICD-10-CM | POA: Diagnosis present

## 2018-11-22 DIAGNOSIS — I517 Cardiomegaly: Secondary | ICD-10-CM | POA: Diagnosis not present

## 2018-11-22 DIAGNOSIS — O99019 Anemia complicating pregnancy, unspecified trimester: Secondary | ICD-10-CM

## 2018-11-22 DIAGNOSIS — O99344 Other mental disorders complicating childbirth: Secondary | ICD-10-CM | POA: Diagnosis present

## 2018-11-22 DIAGNOSIS — Z3400 Encounter for supervision of normal first pregnancy, unspecified trimester: Secondary | ICD-10-CM

## 2018-11-22 DIAGNOSIS — K567 Ileus, unspecified: Secondary | ICD-10-CM | POA: Diagnosis not present

## 2018-11-22 DIAGNOSIS — D689 Coagulation defect, unspecified: Secondary | ICD-10-CM | POA: Diagnosis not present

## 2018-11-22 DIAGNOSIS — Z9289 Personal history of other medical treatment: Secondary | ICD-10-CM

## 2018-11-22 LAB — CBC
HCT: 29.8 % — ABNORMAL LOW (ref 36.0–46.0)
Hemoglobin: 10.7 g/dL — ABNORMAL LOW (ref 12.0–15.0)
MCH: 32.3 pg (ref 26.0–34.0)
MCHC: 35.9 g/dL (ref 30.0–36.0)
MCV: 90 fL (ref 80.0–100.0)
Platelets: 129 10*3/uL — ABNORMAL LOW (ref 150–400)
RBC: 3.31 MIL/uL — ABNORMAL LOW (ref 3.87–5.11)
RDW: 14.2 % (ref 11.5–15.5)
WBC: 10.9 10*3/uL — ABNORMAL HIGH (ref 4.0–10.5)
nRBC: 0.4 % — ABNORMAL HIGH (ref 0.0–0.2)

## 2018-11-22 LAB — ABO/RH: ABO/RH(D): O POS

## 2018-11-22 LAB — RPR: RPR Ser Ql: NONREACTIVE

## 2018-11-22 MED ORDER — LACTATED RINGERS IV SOLN
INTRAVENOUS | Status: DC
  Administered 2018-11-22 (×3): via INTRAVENOUS

## 2018-11-22 MED ORDER — OXYTOCIN 40 UNITS IN NORMAL SALINE INFUSION - SIMPLE MED
2.5000 [IU]/h | INTRAVENOUS | Status: DC
  Filled 2018-11-22: qty 1000

## 2018-11-22 MED ORDER — FENTANYL-BUPIVACAINE-NACL 0.5-0.125-0.9 MG/250ML-% EP SOLN
12.0000 mL/h | EPIDURAL | Status: DC | PRN
  Filled 2018-11-22: qty 250

## 2018-11-22 MED ORDER — MISOPROSTOL 25 MCG QUARTER TABLET
25.0000 ug | ORAL_TABLET | ORAL | Status: DC | PRN
  Administered 2018-11-22: 25 ug via VAGINAL
  Filled 2018-11-22: qty 1

## 2018-11-22 MED ORDER — ONDANSETRON HCL 4 MG/2ML IJ SOLN
4.0000 mg | Freq: Four times a day (QID) | INTRAMUSCULAR | Status: DC | PRN
  Administered 2018-11-23: 4 mg via INTRAVENOUS
  Filled 2018-11-22: qty 2

## 2018-11-22 MED ORDER — FENTANYL CITRATE (PF) 100 MCG/2ML IJ SOLN
100.0000 ug | INTRAMUSCULAR | Status: DC | PRN
  Administered 2018-11-22 – 2018-11-23 (×3): 100 ug via INTRAVENOUS
  Filled 2018-11-22 (×3): qty 2

## 2018-11-22 MED ORDER — ACETAMINOPHEN 325 MG PO TABS
650.0000 mg | ORAL_TABLET | ORAL | Status: DC | PRN
  Administered 2018-11-23: 650 mg via ORAL
  Filled 2018-11-22: qty 2

## 2018-11-22 MED ORDER — OXYTOCIN BOLUS FROM INFUSION
500.0000 mL | Freq: Once | INTRAVENOUS | Status: AC
  Administered 2018-11-23: 500 mL via INTRAVENOUS

## 2018-11-22 MED ORDER — OXYTOCIN 40 UNITS IN NORMAL SALINE INFUSION - SIMPLE MED
1.0000 m[IU]/min | INTRAVENOUS | Status: DC
  Administered 2018-11-22: 2 m[IU]/min via INTRAVENOUS

## 2018-11-22 MED ORDER — EPHEDRINE 5 MG/ML INJ
10.0000 mg | INTRAVENOUS | Status: DC | PRN
  Filled 2018-11-22: qty 10

## 2018-11-22 MED ORDER — DIPHENHYDRAMINE HCL 50 MG/ML IJ SOLN: 12.5000 mg | INTRAMUSCULAR | Status: DC | PRN

## 2018-11-22 MED ORDER — TERBUTALINE SULFATE 1 MG/ML IJ SOLN: 0.2500 mg | Freq: Once | INTRAMUSCULAR | Status: DC | PRN

## 2018-11-22 MED ORDER — LIDOCAINE HCL (PF) 1 % IJ SOLN: 30.0000 mL | INTRAMUSCULAR | Status: DC | PRN

## 2018-11-22 MED ORDER — SOD CITRATE-CITRIC ACID 500-334 MG/5ML PO SOLN: 30.0000 mL | ORAL | Status: DC | PRN

## 2018-11-22 MED ORDER — EPHEDRINE 5 MG/ML INJ
10.0000 mg | INTRAVENOUS | Status: DC | PRN
  Administered 2018-11-23: 10 mg via INTRAVENOUS

## 2018-11-22 MED ORDER — MISOPROSTOL 50MCG HALF TABLET
50.0000 ug | ORAL_TABLET | ORAL | Status: DC | PRN
  Administered 2018-11-22: 50 ug via BUCCAL
  Filled 2018-11-22: qty 1

## 2018-11-22 MED ORDER — PHENYLEPHRINE 40 MCG/ML (10ML) SYRINGE FOR IV PUSH (FOR BLOOD PRESSURE SUPPORT)
80.0000 ug | PREFILLED_SYRINGE | INTRAVENOUS | Status: AC | PRN
  Administered 2018-11-23 (×2): 120 ug via INTRAVENOUS
  Administered 2018-11-23: 80 ug via INTRAVENOUS

## 2018-11-22 MED ORDER — LACTATED RINGERS IV SOLN: 500.0000 mL | INTRAVENOUS | Status: DC | PRN

## 2018-11-22 MED ORDER — LIDOCAINE HCL (PF) 1 % IJ SOLN
INTRAMUSCULAR | Status: DC | PRN
  Administered 2018-11-22 (×2): 3 mL via EPIDURAL

## 2018-11-22 MED ORDER — SODIUM CHLORIDE (PF) 0.9 % IJ SOLN
INTRAMUSCULAR | Status: DC | PRN
  Administered 2018-11-22: 11 mL/h via EPIDURAL

## 2018-11-22 MED ORDER — FENTANYL-BUPIVACAINE-NACL 0.5-0.125-0.9 MG/250ML-% EP SOLN: 12.0000 mL/h | EPIDURAL | Status: DC | PRN

## 2018-11-22 MED ORDER — LACTATED RINGERS IV SOLN: 500.0000 mL | Freq: Once | INTRAVENOUS | Status: DC

## 2018-11-22 MED ORDER — PHENYLEPHRINE 40 MCG/ML (10ML) SYRINGE FOR IV PUSH (FOR BLOOD PRESSURE SUPPORT)
80.0000 ug | PREFILLED_SYRINGE | INTRAVENOUS | Status: AC | PRN
  Administered 2018-11-23 (×2): 120 ug via INTRAVENOUS
  Administered 2018-11-23: 80 ug via INTRAVENOUS
  Filled 2018-11-22 (×3): qty 10

## 2018-11-22 NOTE — Anesthesia Procedure Notes (Signed)
Epidural Patient location during procedure: OB  Staffing Anesthesiologist: Rogelio Waynick, MD Performed: anesthesiologist   Preanesthetic Checklist Completed: patient identified, pre-op evaluation, timeout performed, IV checked, risks and benefits discussed and monitors and equipment checked  Epidural Patient position: sitting Prep: site prepped and draped and DuraPrep Patient monitoring: heart rate, continuous pulse ox and blood pressure Approach: midline Location: L2-L3 Injection technique: LOR air and LOR saline  Needle:  Needle type: Tuohy  Needle gauge: 17 G Needle length: 9 cm Needle insertion depth: 5 cm Catheter type: closed end flexible Catheter size: 19 Gauge Catheter at skin depth: 10 cm Test dose: negative  Assessment Sensory level: T8 Events: blood not aspirated, injection not painful, no injection resistance, negative IV test and no paresthesia  Additional Notes Reason for block:procedure for pain     

## 2018-11-22 NOTE — H&P (Addendum)
OBSTETRIC ADMISSION HISTORY AND PHYSICAL  Makayla Fletcher is a 23 y.o. female G3P0020 with IUP at [redacted]w[redacted]d by LMP (02/21/18) presenting for IOL for sickle cell [place], currently not in crisis.   Reports fetal movement. Denies vaginal bleeding. No leaking of fluids. Mild contractions  She received her prenatal care at [facility].  Support person in labor: Makayla Fletcher, fiance  Ultrasounds . 19w0 anatomy US: wnl . 23w0 - normal interval growth  . 33w2 - normal interval growth  . 36w3 - EFW 3295g, 7lb 4oz, 87th%ile  AC >97th%ile  Prenatal History/Complications: . Hx GAD and Depression  . Vit D Deficiency . Migraine November 2019 Problems (from 05/03/18 to present)    Problem Noted Resolved   Maternal sickle cell anemia affecting pregnancy, antepartum (HCC) 05/11/2018 by Makayla Flake, MD No   Overview Addendum 07/04/2018  8:17 AM by Makayla Antigua, MD    Guidelines for Antenatal Testing and Sonography  (with updated ICD-10 codes)  Updated  03-Mar-2018 with Dr. Noralee Fletcher  INDICATION U/S 2 X week NST/AFI  or full BPP wkly DELIVERY  Sickle Cell Disease - O99.019, D57.1 (or Antiphospholipid syndrome) 20-24-28-32-36 32 39         Supervision of normal first pregnancy, antepartum 05/04/2018 by Makayla Antigua, MD No   Overview Addendum 11/16/2018  4:07 PM by Makayla Fletcher, CMA     Nursing Staff Provider  Office Location  [facility] Dating  LMP  Language   english Anatomy US  Normal  Flu Vaccine   03/2018 at work Genetic Screen  NIPS:low risks   AFP:   neg  TDaP vaccine   Handout given 08/29/18 Hgb A1C or  GTT Early  Third trimester: wnl  Rhogam   n/a   LAB RESULTS   Feeding Plan Breast Blood Type O/Positive/-- (11/13 0906)   Contraception  yes, pill Antibody Negative (11/13 0906)  Circumcision  N/A baby is female Rubella 2.44 (11/13 0906)  Pediatrician  Undecided 11/10/18 RPR Non Reactive (03/09 1017)   Support Person FOB HBsAg Negative (11/13 0906)   Prenatal Classes  HIV Non Reactive  (03/09 1017)  BTL Consent  GBS  (For PCN allergy, check sensitivities)   VBAC Consent  Pap  negative (05/04/2018)    Hgb Electro      CF neg    SMA  3 copies    Waterbirth   Class  Consent  CNM visit           Past Medical History: Past Medical History:  Diagnosis Date  . Depression   . Migraine without aura, without mention of intractable migraine without mention of status migrainosus   . Sickle cell anemia (HCC)   . Urinary tract infection July 2013   frequent    Past Surgical History: Past Surgical History:  Procedure Laterality Date  . NO PAST SURGERIES    . WISDOM TOOTH EXTRACTION     Obstetrical History: OB History    Gravida  3   Para      Term      Preterm      AB  2   Living  0     SAB  2   TAB      Ectopic      Multiple      Live Births              Social History: Social History   Socioeconomic History  . Marital status: Single    Spouse name:  Not on file  . Number of children: 0  . Years of education: college  . Highest education level: Not on file  Occupational History  . Occupation: MINOR    Employer: UNEMPLOYED  . Occupation: Consulting civil engineertudent    Comment: Engineer, waterrising freshman at [employer]  . Occupation: MENTOR/TEACHER    Employer: [employer]  Social Needs  . Financial resource strain: Not hard at all  . Food insecurity:    Worry: Never true    Inability: Never true  . Transportation needs:    Medical: No    Non-medical: Not on file  Tobacco Use  . Smoking status: Never Smoker  . Smokeless tobacco: Never Used  Substance and Sexual Activity  . Alcohol use: Not Currently    Comment: Wine  . Drug use: No  . Sexual activity: Not Currently    Partners: Male    Birth control/protection: None    Comment: Depoprovera   Lifestyle  . Physical activity:    Days per week: Not on file    Minutes per session: Not on file  . Stress: Not at all  Relationships  . Social connections:    Talks on phone: Not on file    Gets  together: Not on file    Attends religious service: Not on file    Active member of club or organization: Not on file    Attends meetings of clubs or organizations: Not on file    Relationship status: Not on file  Other Topics Concern  . Not on file  Social History Narrative  . Not on file    Family History: Family History  Problem Relation Age of Onset  . Migraines Father     Allergies: No Known Allergies  Review of Systems  All systems reviewed and negative except as stated in HPI  Last menstrual period 02/21/2018, unknown if currently breastfeeding. General appearance: alert, cooperative, appears stated age and no distress Lungs: no respiratory distress Heart: regular rate  Abdomen: soft, non-tender; gravid  Pelvic: deferred Extremities: Moving spontaneously, warm, well perfused. No BLEE. 2+ DP. Presentation: cephalic Fetal monitoring: baseline 155 / mod variability/ +a / +d Uterine activity: approximately q7969m contractions    Prenatal labs: ABO, Rh: O/Positive/-- (11/13 0906) Antibody: Negative (11/13 0906) Rubella: 2.44 (11/13 0906) RPR: Non Reactive (03/09 1017)  HBsAg: Negative (11/13 0906)  HIV: Non Reactive (03/09 1017)  GBS:   negative  Glucola: third trimester normal  Genetic screening:  NIPS: low risk, AFP: neg  Prenatal Transfer Tool  Maternal Diabetes: No Genetic Screening: Normal Maternal Ultrasounds/Referrals: Normal Fetal Ultrasounds or other Referrals:  None Maternal Substance Abuse:  No Significant Maternal Medications:  None Significant Maternal Lab Results: Lab values include: Other: [place] dx  Assessment/Plan:  Makayla Fletcher is a 23 y.o. G3P0020 at 1365w1d here for IOL in setting of Sickle cell [place]. She is not on hydroxyurea as outpatient and has not had a crisis requiring hospitalization in 3 years.  Labor: early stage. Mild irregular contractions. Start with cytotec for ripening.   -- pain control: planning for epidural  -- regular  diet  Fetal Wellbeing: EFW 3295g, 7lb 4oz, 87th%ile  AC >97th%ile. Cephalic by [place].  -- continuous fetal monitoring  -- Category II , baseline 155, mod variability, +a, +d  Postpartum Planning -- breast / OCP (contraception) -- RI/[/]Tdap   Genia Hotterachel Kim, M.D.  Family Medicine  PGY-1 11/22/2018 8:47 AM   CNM attestation:  I have seen and examined this patient; I agree  with above documentation in the resident's note.   Makayla Fletcher is a 23 y.o. G3P0020 here for IOL due to sickle cell disease.  PE: BP 116/79   Pulse 99   Temp 98.2 F (36.8 C) (Oral)   Resp 20   LMP 02/21/2018  Gen: calm comfortable, NAD Resp: normal effort, no distress Abd: gravid  ROS, labs, PMH reviewed  Plan: -Admit to Labor & Delivery -Plan cx ripening to start- cytotec and cervical foley followed by Pit/AROM prn -Anticipate SVD  Arabella Merles CNM 11/22/2018, 1:48 PM

## 2018-11-22 NOTE — Progress Notes (Signed)
Patient ID: Makayla Fletcher, female   DOB: August 27, 1995, 23 y.o.   MRN: [id number] Vitals:   11/22/18 1901 11/22/18 1930 11/22/18 2000 11/22/18 2030  BP: 109/80 118/83 (!) 101/54 120/81  Pulse: 99 98 77 97  Resp: 18 16 17 17   Temp:  98.8 F (37.1 C)    TempSrc:  Oral    SpO2:       Comfortable  FHR reactive UCs regular  Cervix deferred  Continue to observe

## 2018-11-22 NOTE — Progress Notes (Signed)
[facility]N Faculty [facility]: Labor Progress Note  Subjective: Patient doing well, no pain with recent epidural placement   Objective: BP 110/74   Pulse 99   Temp 98.2 F (36.8 C) (Oral)   Resp 18   LMP 02/21/2018   SpO2 100%  Gen: Lying in bed comfortably, NAD Dilation: 4.5 Effacement (%): 70 Station: -2 Presentation: Vertex Exam by:: Henderson Newcomer, RN  Assessment and Plan: 23 y.o. [id number] [redacted]w[redacted]d IOL for Sickle cell [place] without pain crisis.   Labor: s/p cytotec x2 (1330), s/p foley bulb. CE check in an hour.  -- pain control: epidural placed  -- PPH Risk: low  Fetal Well-Being: Cephalic by CE. Baseline 125, mod variability, -a, -d -- Category II tracing - Continuous fetal monitoring  -- GBS negative  Genia Hotter, M.D.  Family Medicine  PGY-1 11/22/2018 5:27 PM

## 2018-11-22 NOTE — Progress Notes (Signed)
Progress note:   CE: 5/80%/-2, will start pitocin, pt agreeable  Genia Hotter, M.D.  Family Medicine  PGY-1 11/22/2018 6:56 PM

## 2018-11-22 NOTE — Anesthesia Preprocedure Evaluation (Signed)
Anesthesia Evaluation  Patient identified by MRN, date of birth, ID band Patient awake    Reviewed: Allergy & Precautions, Patient's Chart, lab work & pertinent test results  Airway Mallampati: II  TM Distance: >3 FB Neck ROM: Full    Dental  (+) Dental Advisory Given   Pulmonary neg pulmonary ROS,    Pulmonary exam normal breath sounds clear to auscultation       Cardiovascular negative cardio ROS Normal cardiovascular exam Rhythm:Regular Rate:Normal     Neuro/Psych  Headaches, PSYCHIATRIC DISORDERS Anxiety Depression    GI/Hepatic negative GI ROS, Neg liver ROS,   Endo/Other  negative endocrine ROS  Renal/GU negative Renal ROS     Musculoskeletal negative musculoskeletal ROS (+)   Abdominal   Peds  Hematology  (+) Blood dyscrasia, Sickle cell anemia and anemia ,   Anesthesia Other Findings   Reproductive/Obstetrics (+) Pregnancy                             Anesthesia Physical Anesthesia Plan  ASA: III  Anesthesia Plan: Epidural   Post-op Pain Management:    Induction:   PONV Risk Score and Plan:   Airway Management Planned:   Additional Equipment:   Intra-op Plan:   Post-operative Plan:   Informed Consent: I have reviewed the patients History and Physical, chart, labs and discussed the procedure including the risks, benefits and alternatives for the proposed anesthesia with the patient or authorized representative who has indicated his/her understanding and acceptance.       Plan Discussed with:   Anesthesia Plan Comments:         Anesthesia Quick Evaluation

## 2018-11-22 NOTE — Progress Notes (Addendum)
Patient ID: Makayla Fletcher, female   DOB: 03-28-1996, 23 y.o.   MRN: [id number]  S/p cytotec x 1 dose  BP 116/79, P 99 FHR 130-140, +accels, no decels Ctx irreg, mild Cx 1/50/vtx -2  IUP@39 .1wks [place] dx Cx unfavorable  Cervical foley inserted without difficulty Will give cytotec 50 mcg buccally for this dose, and will repeat q 4hr as long as foley is in Plan for Pitocin when foley comes out  Arabella Merles CNM 11/22/2018 1:43 PM

## 2018-11-23 ENCOUNTER — Inpatient Hospital Stay (HOSPITAL_COMMUNITY): Payer: 59 | Admitting: Certified Registered Nurse Anesthetist

## 2018-11-23 ENCOUNTER — Other Ambulatory Visit: Payer: Self-pay

## 2018-11-23 ENCOUNTER — Encounter (HOSPITAL_COMMUNITY)
Admission: AD | Disposition: A | Discharge status: Home / Self Care | Payer: Self-pay | Source: Home / Self Care | Attending: Family Medicine

## 2018-11-23 ENCOUNTER — Inpatient Hospital Stay (HOSPITAL_COMMUNITY): Payer: 59

## 2018-11-23 ENCOUNTER — Encounter (HOSPITAL_COMMUNITY): Payer: Self-pay

## 2018-11-23 ENCOUNTER — Other Ambulatory Visit: Payer: Self-pay | Admitting: Obstetrics and Gynecology

## 2018-11-23 DIAGNOSIS — J9601 Acute respiratory failure with hypoxia: Secondary | ICD-10-CM

## 2018-11-23 DIAGNOSIS — R748 Abnormal levels of other serum enzymes: Secondary | ICD-10-CM | POA: Diagnosis not present

## 2018-11-23 DIAGNOSIS — D62 Acute posthemorrhagic anemia: Secondary | ICD-10-CM

## 2018-11-23 DIAGNOSIS — N179 Acute kidney failure, unspecified: Secondary | ICD-10-CM | POA: Diagnosis not present

## 2018-11-23 DIAGNOSIS — D689 Coagulation defect, unspecified: Secondary | ICD-10-CM

## 2018-11-23 DIAGNOSIS — O99012 Anemia complicating pregnancy, second trimester: Secondary | ICD-10-CM

## 2018-11-23 DIAGNOSIS — D5700 Hb-SS disease with crisis, unspecified: Secondary | ICD-10-CM

## 2018-11-23 HISTORY — PX: DILATION AND CURETTAGE OF UTERUS: SHX78

## 2018-11-23 HISTORY — PX: CYSTOSCOPY: SHX5120

## 2018-11-23 HISTORY — PX: ABDOMINAL HYSTERECTOMY: SHX81

## 2018-11-23 HISTORY — PX: LAPAROTOMY: SHX154

## 2018-11-23 LAB — POCT I-STAT 7, (LYTES, BLD GAS, ICA,H+H)
Acid-base deficit: 7 mmol/L — ABNORMAL HIGH (ref 0.0–2.0)
Acid-base deficit: 6 mmol/L — ABNORMAL HIGH (ref 0.0–2.0)
Acid-base deficit: 4 mmol/L — ABNORMAL HIGH (ref 0.0–2.0)
Bicarbonate: 20.2 mmol/L (ref 20.0–28.0)
Bicarbonate: 20.4 mmol/L (ref 20.0–28.0)
Bicarbonate: 21.4 mmol/L (ref 20.0–28.0)
Calcium, Ion: 1.02 mmol/L — ABNORMAL LOW (ref 1.15–1.40)
Calcium, Ion: 0.94 mmol/L — ABNORMAL LOW (ref 1.15–1.40)
Calcium, Ion: 1.03 mmol/L — ABNORMAL LOW (ref 1.15–1.40)
HCT: 19 % — ABNORMAL LOW (ref 36.0–46.0)
HCT: 22 % — ABNORMAL LOW (ref 36.0–46.0)
HCT: 17 % — ABNORMAL LOW (ref 36.0–46.0)
Hemoglobin: 6.5 g/dL — CL (ref 12.0–15.0)
Hemoglobin: 7.5 g/dL — ABNORMAL LOW (ref 12.0–15.0)
Hemoglobin: 5.8 g/dL — CL (ref 12.0–15.0)
O2 Saturation: 100 %
O2 Saturation: 100 %
O2 Saturation: 99 %
Patient temperature: 97.9
Potassium: 4.6 mmol/L (ref 3.5–5.1)
Potassium: 4.3 mmol/L (ref 3.5–5.1)
Potassium: 4.8 mmol/L (ref 3.5–5.1)
Sodium: 138 mmol/L (ref 135–145)
Sodium: 141 mmol/L (ref 135–145)
Sodium: 141 mmol/L (ref 135–145)
TCO2: 22 mmol/L (ref 22–32)
TCO2: 22 mmol/L (ref 22–32)
TCO2: 23 mmol/L (ref 22–32)
pCO2 arterial: 49.2 mmHg — ABNORMAL HIGH (ref 32.0–48.0)
pCO2 arterial: 42.1 mmHg (ref 32.0–48.0)
pCO2 arterial: 40.6 mmHg (ref 32.0–48.0)
pH, Arterial: 7.222 — ABNORMAL LOW (ref 7.350–7.450)
pH, Arterial: 7.292 — ABNORMAL LOW (ref 7.350–7.450)
pH, Arterial: 7.327 — ABNORMAL LOW (ref 7.350–7.450)
pO2, Arterial: 413 mmHg — ABNORMAL HIGH (ref 83.0–108.0)
pO2, Arterial: 270 mmHg — ABNORMAL HIGH (ref 83.0–108.0)
pO2, Arterial: 133 mmHg — ABNORMAL HIGH (ref 83.0–108.0)

## 2018-11-23 LAB — CBC
HCT: 22.8 % — ABNORMAL LOW (ref 36.0–46.0)
HCT: 24.1 % — ABNORMAL LOW (ref 36.0–46.0)
HCT: 21.2 % — ABNORMAL LOW (ref 36.0–46.0)
HCT: 14.5 % — ABNORMAL LOW (ref 36.0–46.0)
Hemoglobin: 7.8 g/dL — ABNORMAL LOW (ref 12.0–15.0)
Hemoglobin: 8 g/dL — ABNORMAL LOW (ref 12.0–15.0)
Hemoglobin: 7.3 g/dL — ABNORMAL LOW (ref 12.0–15.0)
Hemoglobin: 5.2 g/dL — CL (ref 12.0–15.0)
MCH: 32.1 pg (ref 26.0–34.0)
MCH: 29.9 pg (ref 26.0–34.0)
MCH: 30.5 pg (ref 26.0–34.0)
MCH: 30.4 pg (ref 26.0–34.0)
MCHC: 34.2 g/dL (ref 30.0–36.0)
MCHC: 33.2 g/dL (ref 30.0–36.0)
MCHC: 34.4 g/dL (ref 30.0–36.0)
MCHC: 35.9 g/dL (ref 30.0–36.0)
MCV: 93.8 fL (ref 80.0–100.0)
MCV: 89.9 fL (ref 80.0–100.0)
MCV: 88.7 fL (ref 80.0–100.0)
MCV: 84.8 fL (ref 80.0–100.0)
Platelets: 50 10*3/uL — ABNORMAL LOW (ref 150–400)
Platelets: 68 10*3/uL — ABNORMAL LOW (ref 150–400)
Platelets: 11 10*3/uL — CL (ref 150–400)
Platelets: 21 10*3/uL — CL (ref 150–400)
RBC: 2.43 MIL/uL — ABNORMAL LOW (ref 3.87–5.11)
RBC: 2.68 MIL/uL — ABNORMAL LOW (ref 3.87–5.11)
RBC: 2.39 MIL/uL — ABNORMAL LOW (ref 3.87–5.11)
RBC: 1.71 MIL/uL — ABNORMAL LOW (ref 3.87–5.11)
RDW: 13.7 % (ref 11.5–15.5)
RDW: 14.5 % (ref 11.5–15.5)
RDW: 14 % (ref 11.5–15.5)
RDW: 13.8 % (ref 11.5–15.5)
WBC: 19.6 10*3/uL — ABNORMAL HIGH (ref 4.0–10.5)
WBC: 21.3 10*3/uL — ABNORMAL HIGH (ref 4.0–10.5)
WBC: 10 10*3/uL (ref 4.0–10.5)
WBC: 7.7 10*3/uL (ref 4.0–10.5)
nRBC: 0.6 % — ABNORMAL HIGH (ref 0.0–0.2)
nRBC: 0.9 % — ABNORMAL HIGH (ref 0.0–0.2)
nRBC: 2.2 % — ABNORMAL HIGH (ref 0.0–0.2)
nRBC: 9 % — ABNORMAL HIGH (ref 0.0–0.2)

## 2018-11-23 LAB — MRSA PCR SCREENING: MRSA by PCR: NEGATIVE

## 2018-11-23 LAB — FIBRINOGEN: Fibrinogen: 224 mg/dL (ref 210–475)

## 2018-11-23 LAB — DIC (DISSEMINATED INTRAVASCULAR COAGULATION)PANEL
D-Dimer, Quant: 11.92 ug/mL-FEU — ABNORMAL HIGH (ref 0.00–0.50)
D-Dimer, Quant: >20 ug/mL-FEU — ABNORMAL HIGH (ref 0.00–0.50)
Fibrinogen: 160 mg/dL — ABNORMAL LOW (ref 210–475)
Fibrinogen: 302 mg/dL (ref 210–475)
INR: 2 — ABNORMAL HIGH (ref 0.8–1.2)
INR: 2 — ABNORMAL HIGH (ref 0.8–1.2)
Platelets: 66 10*3/uL — ABNORMAL LOW (ref 150–400)
Platelets: 20 10*3/uL — CL (ref 150–400)
Prothrombin Time: 22.7 seconds — ABNORMAL HIGH (ref 11.4–15.2)
Prothrombin Time: 22.5 seconds — ABNORMAL HIGH (ref 11.4–15.2)
Smear Review: NONE SEEN
aPTT: 65 seconds — ABNORMAL HIGH (ref 24–36)
aPTT: 48 seconds — ABNORMAL HIGH (ref 24–36)

## 2018-11-23 LAB — GLUCOSE, CAPILLARY
Glucose-Capillary: 133 mg/dL — ABNORMAL HIGH (ref 70–99)
Glucose-Capillary: 93 mg/dL (ref 70–99)
Glucose-Capillary: 71 mg/dL (ref 70–99)
Glucose-Capillary: 59 mg/dL — ABNORMAL LOW (ref 70–99)
Glucose-Capillary: 138 mg/dL — ABNORMAL HIGH (ref 70–99)
Glucose-Capillary: 79 mg/dL (ref 70–99)

## 2018-11-23 LAB — COMPREHENSIVE METABOLIC PANEL
ALT: 349 U/L — ABNORMAL HIGH (ref 0–44)
AST: 663 U/L — ABNORMAL HIGH (ref 15–41)
Albumin: 1.8 g/dL — ABNORMAL LOW (ref 3.5–5.0)
Alkaline Phosphatase: 48 U/L (ref 38–126)
Anion gap: 11 (ref 5–15)
BUN: 7 mg/dL (ref 6–20)
CO2: 20 mmol/L — ABNORMAL LOW (ref 22–32)
Calcium: 7.1 mg/dL — ABNORMAL LOW (ref 8.9–10.3)
Chloride: 110 mmol/L (ref 98–111)
Creatinine, Ser: 1.02 mg/dL — ABNORMAL HIGH (ref 0.44–1.00)
GFR calc Af Amer: >60 mL/min (ref >60)
GFR calc non Af Amer: >60 mL/min (ref >60)
Glucose, Bld: 116 mg/dL — ABNORMAL HIGH (ref 70–99)
Potassium: 4.8 mmol/L (ref 3.5–5.1)
Sodium: 141 mmol/L (ref 135–145)
Total Bilirubin: 4.9 mg/dL — ABNORMAL HIGH (ref 0.3–1.2)
Total Protein: 3.4 g/dL — ABNORMAL LOW (ref 6.5–8.1)

## 2018-11-23 LAB — PREPARE RBC (CROSSMATCH)

## 2018-11-23 LAB — PROTIME-INR
INR: 3.7 — ABNORMAL HIGH (ref 0.8–1.2)
Prothrombin Time: 36.1 seconds — ABNORMAL HIGH (ref 11.4–15.2)

## 2018-11-23 LAB — APTT: aPTT: 91 seconds — ABNORMAL HIGH (ref 24–36)

## 2018-11-23 SURGERY — DILATION AND CURETTAGE
Anesthesia: General | Site: Vagina

## 2018-11-23 MED ORDER — SUCCINYLCHOLINE CHLORIDE 20 MG/ML IJ SOLN
INTRAMUSCULAR | Status: DC | PRN
  Administered 2018-11-23: 100 mg via INTRAVENOUS

## 2018-11-23 MED ORDER — SODIUM CHLORIDE 0.9% IV SOLUTION
Freq: Once | INTRAVENOUS | Status: AC
  Administered 2018-11-23: 13:00:00 via INTRAVENOUS

## 2018-11-23 MED ORDER — TRANEXAMIC ACID-NACL 1000-0.7 MG/100ML-% IV SOLN
INTRAVENOUS | Status: AC
  Administered 2018-11-23: 1000 mg
  Filled 2018-11-23: qty 100

## 2018-11-23 MED ORDER — DIBUCAINE (PERIANAL) 1 % EX OINT
1.0000 "application " | TOPICAL_OINTMENT | CUTANEOUS | Status: DC | PRN
  Filled 2018-11-23: qty 28

## 2018-11-23 MED ORDER — WITCH HAZEL-GLYCERIN EX PADS
1.0000 "application " | MEDICATED_PAD | CUTANEOUS | Status: DC | PRN
  Administered 2018-11-25 – 2018-11-27 (×4): 1 via TOPICAL
  Filled 2018-11-23 (×2): qty 100

## 2018-11-23 MED ORDER — PHENYLEPHRINE HCL-NACL 20-0.9 MG/250ML-% IV SOLN
INTRAVENOUS | Status: AC
  Filled 2018-11-23: qty 250

## 2018-11-23 MED ORDER — LACTATED RINGERS IV SOLN
INTRAVENOUS | Status: DC
  Administered 2018-11-23 – 2018-11-24 (×2): via INTRAVENOUS

## 2018-11-23 MED ORDER — MISOPROSTOL 200 MCG PO TABS
800.0000 ug | ORAL_TABLET | Freq: Once | ORAL | Status: AC
  Administered 2018-11-23: 1000 ug via RECTAL

## 2018-11-23 MED ORDER — SUCCINYLCHOLINE CHLORIDE 200 MG/10ML IV SOSY
PREFILLED_SYRINGE | INTRAVENOUS | Status: AC
  Filled 2018-11-23: qty 10

## 2018-11-23 MED ORDER — PROPOFOL 10 MG/ML IV BOLUS
INTRAVENOUS | Status: AC
  Filled 2018-11-23: qty 20

## 2018-11-23 MED ORDER — HYDROMORPHONE HCL 1 MG/ML IJ SOLN
INTRAMUSCULAR | Status: DC | PRN
  Administered 2018-11-23: 1 mg via INTRAVENOUS

## 2018-11-23 MED ORDER — FOLIC ACID 1 MG PO TABS: 1.0000 mg | ORAL_TABLET | Freq: Every day | ORAL | Status: DC

## 2018-11-23 MED ORDER — SODIUM CHLORIDE 0.9% IV SOLUTION: Freq: Once | INTRAVENOUS | Status: DC

## 2018-11-23 MED ORDER — ORAL CARE MOUTH RINSE
15.0000 mL | OROMUCOSAL | Status: DC
  Administered 2018-11-23 – 2018-11-24 (×6): 15 mL via OROMUCOSAL

## 2018-11-23 MED ORDER — METHYLERGONOVINE MALEATE 0.2 MG/ML IJ SOLN: 0.2000 mg | Freq: Once | INTRAMUSCULAR | Status: DC

## 2018-11-23 MED ORDER — FENTANYL CITRATE (PF) 100 MCG/2ML IJ SOLN
50.0000 ug | INTRAMUSCULAR | Status: DC | PRN
  Administered 2018-11-24 (×2): 100 ug via INTRAVENOUS
  Administered 2018-11-24: 50 ug via INTRAVENOUS
  Filled 2018-11-23 (×3): qty 2

## 2018-11-23 MED ORDER — OXYTOCIN 40 UNITS IN NORMAL SALINE INFUSION - SIMPLE MED
INTRAVENOUS | Status: DC | PRN
  Administered 2018-11-23 (×11): 40 mL via INTRAVENOUS

## 2018-11-23 MED ORDER — FENTANYL CITRATE (PF) 250 MCG/5ML IJ SOLN
INTRAMUSCULAR | Status: AC
  Filled 2018-11-23: qty 5

## 2018-11-23 MED ORDER — TERBUTALINE SULFATE 1 MG/ML IJ SOLN
INTRAMUSCULAR | Status: AC
  Filled 2018-11-23: qty 1

## 2018-11-23 MED ORDER — ALBUMIN HUMAN 5 % IV SOLN
INTRAVENOUS | Status: AC
  Filled 2018-11-23: qty 250

## 2018-11-23 MED ORDER — CHLORHEXIDINE GLUCONATE 0.12% ORAL RINSE (MEDLINE KIT)
15.0000 mL | Freq: Two times a day (BID) | OROMUCOSAL | Status: DC
  Administered 2018-11-23 – 2018-11-24 (×2): 15 mL via OROMUCOSAL

## 2018-11-23 MED ORDER — IOHEXOL 350 MG/ML SOLN
100.0000 mL | Freq: Once | INTRAVENOUS | Status: AC | PRN
  Administered 2018-11-23: 100 mL via INTRAVENOUS

## 2018-11-23 MED ORDER — FENTANYL CITRATE (PF) 100 MCG/2ML IJ SOLN
50.0000 ug | INTRAMUSCULAR | Status: DC | PRN
  Administered 2018-11-23 (×4): 100 ug via INTRAVENOUS
  Filled 2018-11-23 (×4): qty 2

## 2018-11-23 MED ORDER — SODIUM CHLORIDE 0.9% IV SOLUTION: Freq: Once | INTRAVENOUS | Status: AC

## 2018-11-23 MED ORDER — PHENYLEPHRINE 40 MCG/ML (10ML) SYRINGE FOR IV PUSH (FOR BLOOD PRESSURE SUPPORT)
PREFILLED_SYRINGE | INTRAVENOUS | Status: AC
  Filled 2018-11-23: qty 10

## 2018-11-23 MED ORDER — SODIUM CHLORIDE 0.9% IV SOLUTION
Freq: Once | INTRAVENOUS | Status: AC
  Administered 2018-11-23: 21:00:00 via INTRAVENOUS

## 2018-11-23 MED ORDER — TERBUTALINE SULFATE 1 MG/ML IJ SOLN
INTRAMUSCULAR | Status: DC | PRN
  Administered 2018-11-23: 200 ug via SUBCUTANEOUS

## 2018-11-23 MED ORDER — HYDROMORPHONE HCL 1 MG/ML IJ SOLN
INTRAMUSCULAR | Status: AC
  Filled 2018-11-23: qty 1

## 2018-11-23 MED ORDER — SENNOSIDES-DOCUSATE SODIUM 8.6-50 MG PO TABS: 2.0000 | ORAL_TABLET | ORAL | Status: DC

## 2018-11-23 MED ORDER — PRENATAL MULTIVITAMIN CH: 1.0000 | ORAL_TABLET | Freq: Every day | ORAL | Status: DC

## 2018-11-23 MED ORDER — NALOXONE HCL 0.4 MG/ML IJ SOLN
INTRAMUSCULAR | Status: AC
  Administered 2018-11-23: 04:00:00
  Filled 2018-11-23: qty 1

## 2018-11-23 MED ORDER — DEXTROSE 50 % IV SOLN: 25.0000 mL | Freq: Once | INTRAVENOUS | Status: AC

## 2018-11-23 MED ORDER — PROPOFOL 1000 MG/100ML IV EMUL
0.0000 ug/kg/min | INTRAVENOUS | Status: DC
  Administered 2018-11-23 (×2): 40 ug/kg/min via INTRAVENOUS
  Administered 2018-11-23: 45 ug/kg/min via INTRAVENOUS
  Administered 2018-11-24: 50 ug/kg/min via INTRAVENOUS
  Administered 2018-11-24: 46.9208 ug/kg/min via INTRAVENOUS
  Filled 2018-11-23 (×4): qty 100

## 2018-11-23 MED ORDER — TRANEXAMIC ACID-NACL 1000-0.7 MG/100ML-% IV SOLN: 1000.0000 mg | INTRAVENOUS | Status: DC

## 2018-11-23 MED ORDER — PHENYLEPHRINE HCL (PRESSORS) 10 MG/ML IV SOLN
INTRAVENOUS | Status: DC | PRN
  Administered 2018-11-23 (×2): 120 mg via INTRAVENOUS

## 2018-11-23 MED ORDER — DEXTROSE 50 % IV SOLN
INTRAVENOUS | Status: AC
  Administered 2018-11-23: 50 mL
  Filled 2018-11-23: qty 50

## 2018-11-23 MED ORDER — STERILE WATER FOR IRRIGATION IR SOLN
Status: DC | PRN
  Administered 2018-11-23: 1000 mL

## 2018-11-23 MED ORDER — ONDANSETRON HCL 4 MG/2ML IJ SOLN
INTRAMUSCULAR | Status: DC | PRN
  Administered 2018-11-23 (×2): 4 mg via INTRAVENOUS

## 2018-11-23 MED ORDER — SIMETHICONE 80 MG PO CHEW: 80.0000 mg | CHEWABLE_TABLET | ORAL | Status: DC

## 2018-11-23 MED ORDER — DEXAMETHASONE SODIUM PHOSPHATE 10 MG/ML IJ SOLN
INTRAMUSCULAR | Status: DC | PRN
  Administered 2018-11-23: 4 mg via INTRAVENOUS

## 2018-11-23 MED ORDER — FENTANYL CITRATE (PF) 100 MCG/2ML IJ SOLN
50.0000 ug | INTRAMUSCULAR | Status: AC | PRN
  Administered 2018-11-24 (×3): 50 ug via INTRAVENOUS
  Filled 2018-11-23 (×3): qty 2

## 2018-11-23 MED ORDER — OXYTOCIN 40 UNITS IN NORMAL SALINE INFUSION - SIMPLE MED
INTRAVENOUS | Status: AC
  Filled 2018-11-23: qty 1000

## 2018-11-23 MED ORDER — CALCIUM GLUCONATE-NACL 1-0.675 GM/50ML-% IV SOLN
1.0000 g | Freq: Once | INTRAVENOUS | Status: AC
  Administered 2018-11-23: 1000 mg via INTRAVENOUS
  Filled 2018-11-23: qty 50

## 2018-11-23 MED ORDER — SODIUM CHLORIDE 0.9 % IV SOLN
500.0000 mg | Freq: Once | INTRAVENOUS | Status: DC
  Filled 2018-11-23: qty 500

## 2018-11-23 MED ORDER — MIDAZOLAM HCL 2 MG/2ML IJ SOLN
INTRAMUSCULAR | Status: DC | PRN
  Administered 2018-11-23: 2 mg via INTRAVENOUS

## 2018-11-23 MED ORDER — SODIUM CHLORIDE 0.9 % IV SOLN
INTRAVENOUS | Status: DC | PRN
  Administered 2018-11-23: 05:00:00 via INTRAVENOUS

## 2018-11-23 MED ORDER — ROCURONIUM BROMIDE 100 MG/10ML IV SOLN
INTRAVENOUS | Status: DC | PRN
  Administered 2018-11-23 (×3): 10 mg via INTRAVENOUS
  Administered 2018-11-23: 30 mg via INTRAVENOUS

## 2018-11-23 MED ORDER — SIMETHICONE 80 MG PO CHEW: 80.0000 mg | CHEWABLE_TABLET | Freq: Three times a day (TID) | ORAL | Status: DC

## 2018-11-23 MED ORDER — MENTHOL 3 MG MT LOZG: 1.0000 | LOZENGE | OROMUCOSAL | Status: DC | PRN

## 2018-11-23 MED ORDER — LIDOCAINE HCL (CARDIAC) PF 100 MG/5ML IV SOSY
PREFILLED_SYRINGE | INTRAVENOUS | Status: DC | PRN
  Administered 2018-11-23: 60 mg via INTRAVENOUS

## 2018-11-23 MED ORDER — LIDOCAINE 2% (20 MG/ML) 5 ML SYRINGE
INTRAMUSCULAR | Status: AC
  Filled 2018-11-23: qty 5

## 2018-11-23 MED ORDER — SODIUM CHLORIDE 0.9 % IV SOLN
INTRAVENOUS | Status: AC
  Filled 2018-11-23: qty 500

## 2018-11-23 MED ORDER — SODIUM CHLORIDE 0.9 % IV SOLN
INTRAVENOUS | Status: DC | PRN
  Administered 2018-11-23: 500 mg via INTRAVENOUS

## 2018-11-23 MED ORDER — LACTATED RINGERS IV SOLN
INTRAVENOUS | Status: DC | PRN
  Administered 2018-11-23 (×4): via INTRAVENOUS

## 2018-11-23 MED ORDER — METHYLERGONOVINE MALEATE 0.2 MG/ML IJ SOLN
INTRAMUSCULAR | Status: AC
  Administered 2018-11-23: 04:00:00
  Filled 2018-11-23: qty 1

## 2018-11-23 MED ORDER — PHENYLEPHRINE HCL-NACL 20-0.9 MG/250ML-% IV SOLN
INTRAVENOUS | Status: DC | PRN
  Administered 2018-11-23: 60 ug/min via INTRAVENOUS

## 2018-11-23 MED ORDER — TETANUS-DIPHTH-ACELL PERTUSSIS 5-2.5-18.5 LF-MCG/0.5 IM SUSP
0.5000 mL | Freq: Once | INTRAMUSCULAR | Status: AC
  Administered 2018-11-24: 0.5 mL via INTRAMUSCULAR
  Filled 2018-11-23: qty 0.5

## 2018-11-23 MED ORDER — FUROSEMIDE 10 MG/ML IJ SOLN
40.0000 mg | Freq: Once | INTRAMUSCULAR | Status: AC
  Administered 2018-11-24: 40 mg via INTRAVENOUS
  Filled 2018-11-23: qty 4

## 2018-11-23 MED ORDER — PROPOFOL 1000 MG/100ML IV EMUL
INTRAVENOUS | Status: AC
  Filled 2018-11-23: qty 200

## 2018-11-23 MED ORDER — ONDANSETRON HCL 4 MG/2ML IJ SOLN
INTRAMUSCULAR | Status: AC
  Filled 2018-11-23: qty 2

## 2018-11-23 MED ORDER — CEFAZOLIN SODIUM-DEXTROSE 2-3 GM-%(50ML) IV SOLR
INTRAVENOUS | Status: DC | PRN
  Administered 2018-11-23 (×2): 2 g via INTRAVENOUS

## 2018-11-23 MED ORDER — CARBOPROST TROMETHAMINE 250 MCG/ML IM SOLN
INTRAMUSCULAR | Status: DC | PRN
  Administered 2018-11-23: 250 ug via INTRAMUSCULAR

## 2018-11-23 MED ORDER — CEFAZOLIN SODIUM-DEXTROSE 2-4 GM/100ML-% IV SOLN
INTRAVENOUS | Status: AC
  Filled 2018-11-23: qty 100

## 2018-11-23 MED ORDER — MIDAZOLAM HCL 2 MG/2ML IJ SOLN
INTRAMUSCULAR | Status: AC
  Filled 2018-11-23: qty 2

## 2018-11-23 MED ORDER — DEXAMETHASONE SODIUM PHOSPHATE 4 MG/ML IJ SOLN
INTRAMUSCULAR | Status: AC
  Filled 2018-11-23: qty 1

## 2018-11-23 MED ORDER — FUROSEMIDE 10 MG/ML IJ SOLN
40.0000 mg | Freq: Once | INTRAMUSCULAR | Status: AC
  Administered 2018-11-23: 40 mg via INTRAVENOUS
  Filled 2018-11-23: qty 4

## 2018-11-23 MED ORDER — SIMETHICONE 80 MG PO CHEW: 80.0000 mg | CHEWABLE_TABLET | ORAL | Status: DC | PRN

## 2018-11-23 MED ORDER — COCONUT OIL OIL
1.0000 "application " | TOPICAL_OIL | Status: DC | PRN
  Administered 2018-11-27: 1 via TOPICAL
  Filled 2018-11-23: qty 120

## 2018-11-23 MED ORDER — FENTANYL CITRATE (PF) 100 MCG/2ML IJ SOLN
INTRAMUSCULAR | Status: DC | PRN
  Administered 2018-11-23 (×2): 50 ug via INTRAVENOUS
  Administered 2018-11-23: 100 ug via INTRAVENOUS
  Administered 2018-11-23: 50 ug via INTRAVENOUS

## 2018-11-23 MED ORDER — CALCIUM GLUCONATE 10 % IV SOLN
INTRAVENOUS | Status: DC | PRN
  Administered 2018-11-23: 1 g via INTRAVENOUS

## 2018-11-23 MED ORDER — OXYTOCIN 40 UNITS IN NORMAL SALINE INFUSION - SIMPLE MED
2.5000 [IU]/h | INTRAVENOUS | Status: DC
  Filled 2018-11-23: qty 1000

## 2018-11-23 MED ORDER — CEFAZOLIN SODIUM-DEXTROSE 2-4 GM/100ML-% IV SOLN: 2.0000 g | Freq: Once | INTRAVENOUS | Status: DC

## 2018-11-23 MED ORDER — DIPHENHYDRAMINE HCL 25 MG PO CAPS: 25.0000 mg | ORAL_CAPSULE | Freq: Four times a day (QID) | ORAL | Status: DC | PRN

## 2018-11-23 MED ORDER — ESMOLOL HCL 100 MG/10ML IV SOLN
INTRAVENOUS | Status: DC | PRN
  Administered 2018-11-23: 20 mg via INTRAVENOUS

## 2018-11-23 MED ORDER — CALCIUM GLUCONATE-NACL 1-0.675 GM/50ML-% IV SOLN
INTRAVENOUS | Status: AC
  Filled 2018-11-23: qty 50

## 2018-11-23 MED ORDER — FERROUS SULFATE 325 (65 FE) MG PO TABS: 325.0000 mg | ORAL_TABLET | Freq: Two times a day (BID) | ORAL | Status: DC

## 2018-11-23 MED ORDER — PROPOFOL 500 MG/50ML IV EMUL
INTRAVENOUS | Status: DC | PRN
  Administered 2018-11-23: 75 ug/kg/min via INTRAVENOUS

## 2018-11-23 MED ORDER — OXYTOCIN 10 UNIT/ML IJ SOLN: INTRAMUSCULAR | Status: DC | PRN

## 2018-11-23 MED ORDER — SODIUM CHLORIDE 0.9 % IR SOLN
Status: DC | PRN
  Administered 2018-11-23: 1

## 2018-11-23 MED ORDER — ETOMIDATE 2 MG/ML IV SOLN
INTRAVENOUS | Status: DC | PRN
  Administered 2018-11-23: 12 mg via INTRAVENOUS

## 2018-11-23 MED ORDER — MISOPROSTOL 200 MCG PO TABS
ORAL_TABLET | ORAL | Status: AC
  Filled 2018-11-23: qty 5

## 2018-11-23 MED ORDER — ZOLPIDEM TARTRATE 5 MG PO TABS: 5.0000 mg | ORAL_TABLET | Freq: Every evening | ORAL | Status: DC | PRN

## 2018-11-23 MED ORDER — LORAZEPAM 2 MG/ML IJ SOLN
1.0000 mg | Freq: Four times a day (QID) | INTRAMUSCULAR | Status: DC | PRN
  Administered 2018-11-23 – 2018-11-24 (×2): 1 mg via INTRAVENOUS
  Administered 2018-11-28: 0.5 mg via INTRAVENOUS
  Filled 2018-11-23 (×3): qty 1

## 2018-11-23 MED FILL — Medication: Qty: 1 | Status: AC

## 2018-11-23 SURGICAL SUPPLY — 50 items
APL PRP STRL LF DISP 70% ISPRP (MISCELLANEOUS) ×4
APL SKNCLS STERI-STRIP NONHPOA (GAUZE/BANDAGES/DRESSINGS) ×4
BALLN POSTPARTUM SOS BAKRI (BALLOONS) ×6
BALLOON POSTPARTUM SOS BAKRI (BALLOONS) ×4 IMPLANT
BENZOIN TINCTURE PRP APPL 2/3 (GAUZE/BANDAGES/DRESSINGS) ×6 IMPLANT
CANISTER SUCT 3000ML PPV (MISCELLANEOUS) ×24 IMPLANT
CATH FOLEY LATEX FREE 14FR (CATHETERS) ×12
CATH FOLEY LF 14FR (CATHETERS) ×8 IMPLANT
CHLORAPREP W/TINT 26 (MISCELLANEOUS) ×6 IMPLANT
CLOSURE WOUND 1/2 X4 (GAUZE/BANDAGES/DRESSINGS) ×1
DRAPE C SECTION CLR SCREEN (DRAPES) ×6 IMPLANT
DRSG OPSITE POSTOP 4X10 (GAUZE/BANDAGES/DRESSINGS) ×6 IMPLANT
ELECT REM PT RETURN 9FT ADLT (ELECTROSURGICAL) ×6
ELECTRODE REM PT RTRN 9FT ADLT (ELECTROSURGICAL) ×4 IMPLANT
GAUZE SPONGE 4X4 12PLY STRL LF (GAUZE/BANDAGES/DRESSINGS) ×12 IMPLANT
GLOVE BIO SURGEON STRL SZ7 (GLOVE) ×24 IMPLANT
GLOVE INDICATOR 7.5 STRL GRN (GLOVE) ×24 IMPLANT
GOWN STRL REUS W/ TWL LRG LVL3 (GOWN DISPOSABLE) ×16 IMPLANT
GOWN STRL REUS W/TWL LRG LVL3 (GOWN DISPOSABLE) ×24
NEEDLE HYPO 22GX1.5 SAFETY (NEEDLE) ×6 IMPLANT
PACK ABDOMINAL GYN (CUSTOM PROCEDURE TRAY) ×6 IMPLANT
PACK VAGINAL MINOR WOMEN LF (CUSTOM PROCEDURE TRAY) ×6 IMPLANT
PAD ABD 7.5X8 STRL (GAUZE/BANDAGES/DRESSINGS) ×6 IMPLANT
PAD OB MATERNITY 4.3X12.25 (PERSONAL CARE ITEMS) ×6 IMPLANT
PENCIL SMOKE EVAC W/HOLSTER (ELECTROSURGICAL) ×6 IMPLANT
PROTECTOR NERVE ULNAR (MISCELLANEOUS) ×6 IMPLANT
RETRACTOR WND ALEXIS 25 LRG (MISCELLANEOUS) ×4 IMPLANT
RTRCTR WOUND ALEXIS 25CM LRG (MISCELLANEOUS) ×6
SET BERKELEY SUCTION TUBING (SUCTIONS) ×18 IMPLANT
SET CYSTO W/LG BORE CLAMP LF (SET/KITS/TRAYS/PACK) ×6 IMPLANT
SPONGE GAUZE 4X4 16PLY NONSTR (GAUZE/BANDAGES/DRESSINGS) ×12 IMPLANT
SPONGE LAP 18X18 RF (DISPOSABLE) ×36 IMPLANT
STAPLER VISISTAT 35W (STAPLE) ×6 IMPLANT
STRIP CLOSURE SKIN 1/2X4 (GAUZE/BANDAGES/DRESSINGS) ×5 IMPLANT
SUT CHROMIC 1 CTX 36 (SUTURE) ×18 IMPLANT
SUT MNCRL AB 4-0 PS2 18 (SUTURE) ×6 IMPLANT
SUT MON AB 3-0 SH 27 (SUTURE) ×6
SUT MON AB 3-0 SH27 (SUTURE) ×4 IMPLANT
SUT PDS AB 0 CTX 60 (SUTURE) ×18 IMPLANT
SUT VIC AB 0 CT1 18XCR BRD8 (SUTURE) ×8 IMPLANT
SUT VIC AB 0 CT1 27 (SUTURE) ×18
SUT VIC AB 0 CT1 27XBRD ANBCTR (SUTURE) ×12 IMPLANT
SUT VIC AB 0 CT1 36 (SUTURE) ×12 IMPLANT
SUT VIC AB 0 CT1 8-18 (SUTURE) ×12
SUT VIC AB 4-0 PS2 18 (SUTURE) ×6 IMPLANT
SYR CONTROL 10ML LL (SYRINGE) ×6 IMPLANT
TOWEL OR 17X24 6PK STRL BLUE (TOWEL DISPOSABLE) ×12 IMPLANT
WATER STERILE IRR 1000ML POUR (IV SOLUTION) ×12 IMPLANT
WATER STERILE IRRG 1000ML (IV SOLUTION) ×12 IMPLANT
YANKAUER SUCT BULB TIP NO VENT (SUCTIONS) ×6 IMPLANT

## 2018-11-23 NOTE — Progress Notes (Addendum)
Unit 3 emergency release PRBCs started.  Unit number [id number]

## 2018-11-23 NOTE — Progress Notes (Addendum)
Unit 2 emergency release PRBCs started.  Unit number [id number] [phone]

## 2018-11-23 NOTE — Progress Notes (Signed)
Patient transported to and from CT without complications.  

## 2018-11-23 NOTE — Consult Note (Addendum)
NAME:  Makayla Fletcher, MRN:  [id number], DOB:  [birth year], LOS: 1 ADMISSION DATE:  11/22/2018, CONSULTATION DATE:  11/23/18 REFERRING MD:  Adrian BlackwaterStinson - OBGYN  CHIEF COMPLAINT:  Post-partum hemorrhagic shock   Brief History    23 yo F who on 6/3 underwent Vaginal delivery complicated by uterine inversion, retained placenta, post-partum hemorrhage with hemorrhagic shock, emergent hysterectomy. Underwent MTP receiving 5.4L crystalloid, 4 Cryo, 2 plt, 8 PRBC, 5 FFP. Intubated, transferring to MICU  History of present illness    23 yo F PMH depression, sickle cell anemia, migraine who on 6/3 underwent spontaneous vaginal delivery complicated by uterine inversion, retained placenta, post-partum hemorrhage with hemorrhagic shock, emergent hysterectomy. Underwent MTP receiving 5.4L crystalloid, 4 Cryo, 2 plt, 8 PRBC, 5 FFP. Remains intubated.   Past Medical History  Sickle cell anemia Depression Migraine  Significant Hospital Events    6/3 Vaginal delivery complicated by uterine inversion, retained placenta, post-partum hemorrhage with hemorrhagic shock, emergent hysterectomy. Remains intubated and being transferred to ICU   Consults:  OBGYN PCCM  Procedures:  6/3 hysterectomy 2/2 post-partum hemorrhagic shock 6/3 ETT >>>   Significant Diagnostic Tests:   Micro Data:   Antimicrobials:  Azithromycin 6/3>> Ancef 6/3   Interim history/subjective:   S/p vaginal delivery with uterine inversion, retained placenta, post-partum hemorrhage, hemorrhagic shock, s/p emergent hysterectomy   Objective   Blood pressure (!) 147/97, pulse (!) 101, temperature 99.4 F (37.4 C), temperature source Axillary, resp. rate 17, height 5\' 2"  (1.575 m), last menstrual period 02/21/2018, SpO2 100 %, unknown if currently breastfeeding.    Vent Mode: PRVC FiO2 (%):  [50 %] 50 % Set Rate:  [16 bmp] 16 bmp Vt Set:  [400 mL] 400 mL PEEP:  [5 cmH20] 5 cmH20 Plateau Pressure:  [18 cmH20] 18 cmH20    Intake/Output Summary (Last 24 hours) at 11/23/2018 0946 Last data filed at 11/23/2018 0913 Gross per 24 hour  Intake 8311 ml  Output 7815 ml  Net 496 ml   There were no vitals filed for this visit.  Examination: General: Critically ill, young adult female, intubated, sedated NAD  HENT: NCAT. Pink mmm. ETT secure. Trachea midline.  Lungs: CTA bilaterally. Symmetrical chest expansion. No accessory muscle recruitment, synchronous with vent  Cardiovascular: RRR s1s2 no rgm. 1+ radial pulses  Abdomen: Soft, distended. Abdominal incision dressing c/d/i without evidence of bleeding  Extremities: Symmetrical bulk and tone.  Neuro: Sedated. PERRL 2mm. Does not follow commands  GU: Foley  Resolved Hospital Problem list    Assessment & Plan:   Post-partum hemorrhagic shock s/p ex lap with hysterectomy S/p MTP with 7 PRNB 4 Cryo 2 Plt 5 FFP  P MAP goal > 65. Currently HDS  Trend CBCs q6 Transfuse per unit protocol  Post-partum care per OBGYN LR mIVF 125/hr   Acute respiratory failure requiring intubation -airway protection for general anesthesia for ex lap, hysterectomy 2/2 hemorrhagic shock -remains intubated  -FiO2 decreased to 30% P Prop gtt and PRN fent Titrate FiO2 PEEP for SpO2 goal > 94 WUA/SBT in AM, hope to extubate  Pulm hygiene  CXR now AM CXR   Best practice:  Diet: NPO Pain/Anxiety/Delirium protocol (if indicated): Prop, PRN fent  VAP protocol (if indicated): yes DVT prophylaxis: SCD  GI prophylaxis: protonix  Glucose control: monitor Mobility: bedrest Code Status: Full  Family Communication: per OB Disposition: ICU   Labs   CBC: Recent Labs  Lab 11/22/18 0831 11/23/18 0422 11/23/18 0640 11/23/18 0643 11/23/18 0829  WBC  10.9* 19.6* 21.3*  --   --   HGB 10.7* 7.8* 8.0* 6.5* 7.5*  HCT 29.8* 22.8* 24.1* 19.0* 22.0*  MCV 90.0 93.8 89.9  --   --   PLT 129* 50* 66*  68*  --   --     Basic Metabolic Panel: Recent Labs  Lab 11/23/18 0643 11/23/18  0829  NA 138 141  K 4.6 4.3   GFR: CrCl cannot be calculated (Patient's most recent lab result is older than the maximum 21 days allowed.). Recent Labs  Lab 11/22/18 0831 11/23/18 0422 11/23/18 0640  WBC 10.9* 19.6* 21.3*    Liver Function Tests: No results for input(s): AST, ALT, ALKPHOS, BILITOT, PROT, ALBUMIN in the last 168 hours. No results for input(s): LIPASE, AMYLASE in the last 168 hours. No results for input(s): AMMONIA in the last 168 hours.  ABG    Component Value Date/Time   PHART 7.292 (L) 11/23/2018 0829   PCO2ART 42.1 11/23/2018 0829   PO2ART 270.0 (H) 11/23/2018 0829   HCO3 20.4 11/23/2018 0829   TCO2 22 11/23/2018 0829   ACIDBASEDEF 6.0 (H) 11/23/2018 0829   O2SAT 100.0 11/23/2018 0829     Coagulation Profile: Recent Labs  Lab 11/23/18 0640  INR 2.0*    Cardiac Enzymes: No results for input(s): CKTOTAL, CKMB, CKMBINDEX, TROPONINI in the last 168 hours.  HbA1C: Hgb A1c MFr Bld  Date/Time Value Ref Range Status  03/15/2014 02:34 PM 4.0 <5.7 % Final    Comment:                                                                           According to the ADA Clinical Practice Recommendations for 2011, when HbA1c is used as a screening test:     >=6.5%   Diagnostic of Diabetes Mellitus            (if abnormal result is confirmed)   5.7-6.4%   Increased risk of developing Diabetes Mellitus   References:Diagnosis and Classification of Diabetes Mellitus,Diabetes Care,2011,34(Suppl 1):S62-S69 and Standards of Medical Care in         Diabetes - 2011,Diabetes Care,2011,34 (Suppl 1):S11-S61.   Result repeated and verified.    CBG: No results for input(s): GLUCAP in the last 168 hours.  Review of Systems:   Unable to obtain, patient intubated, sedated   Past Medical History  She,  has a past medical history of Depression, Migraine without aura, without mention of intractable migraine without mention of status migrainosus, Sickle cell anemia  (HCC), and Urinary tract infection (July 2013).   Surgical History    Past Surgical History:  Procedure Laterality Date  . NO PAST SURGERIES    . WISDOM TOOTH EXTRACTION       Social History   reports that she has never smoked. She has never used smokeless tobacco. She reports previous alcohol use. She reports that she does not use drugs.   Family History   Her family history includes Migraines in her father.   Allergies No Known Allergies   Home Medications  Prior to Admission medications   Medication Sig Start Date End Date Taking? Authorizing Provider  ferrous sulfate (FERROUSUL) 325 (65 FE) MG tablet Take 1 tablet (  325 mg total) by mouth 2 (two) times daily. 09/01/18  Yes Hermina Staggers, MD  prenatal vitamin w/FE, FA (PRENATAL 1 + 1) 27-1 MG TABS tablet Take 1 tablet by mouth daily at 12 noon.   Yes [provider]  folic acid (FOLVITE) 1 MG tablet TAKE 1 TABLET (1 MG TOTAL) BY MOUTH DAILY. Patient not taking: Reported on 11/22/2018 03/08/17   Massie Maroon, FNP  hydrocortisone (ANUSOL-HC) 25 MG suppository Place 1 suppository (25 mg total) rectally 2 (two) times daily. Patient not taking: Reported on 11/10/2018 10/14/18   Hermina Staggers, MD     Critical care time: 40 min      Tessie Fass MSN, AGACNP-BC  Pulmonary/Critical Care Medicine [phone] If no answer, [phone] 11/23/2018, 9:46 AM  Attending Note:  23 year old female s/p hysterectomy for uterine inversion and bleeding.  PCCM was consulted for vent and medical management post massive transfusion protocol.  On exam, she is sedate and not following any commands.  Will order a CXR now to verify ETT placement.  Titrate O2 for sat of 88-92%.  Adjust vent for ABG.  Propofol and fentanyl ordered for sedation.  Will order H&H for later today.  Transfuse per protocol.  Post op care per primary.  PCCM will continue to follow.  The patient is critically ill with multiple organ systems failure and  requires high complexity decision making for assessment and support, frequent evaluation and titration of therapies, application of advanced monitoring technologies and extensive interpretation of multiple databases.   Critical Care Time devoted to patient care services described in this note is  45  Minutes. This time reflects time of care of this signee Dr Koren Bound. This critical care time does not reflect procedure time, or teaching time or supervisory time of PA/NP/Med student/Med Resident etc but could involve care discussion time.  Alyson Reedy, M.D. [facility] Pulmonary/Critical Care Medicine. Pager: [phone]. After hours pager: [phone].

## 2018-11-23 NOTE — Progress Notes (Addendum)
Unit 1 emergency release FFP started.  Unit number [id number] [phone]

## 2018-11-23 NOTE — Progress Notes (Signed)
Patient peri pads changed and weighed. Dr. Adrian Blackwater notified of updated QBL of 3238.

## 2018-11-23 NOTE — Op Note (Signed)
Operative Note   11/23/2018  PRE-OP DIAGNOSIS *Postpartum hemorrhage *Uterine inversion   POST-OP DIAGNOSIS *Same *DIC    SURGEON:    Pollock Pines Bing, MD - Primary    * Levie Heritage, DO - Assisting  PROCEDURE: Exam under anesthesia, exploratory laparotomy, supracervical hysterectomy, cystoscopy  ANESTHESIA: General and local  ESTIMATED BLOOD LOSS: on L&D, in the OR  DRAINS: indwelling foley UOP   TOTAL IV FLUIDS:  crystalloid 2U cryoprecipitate and 2U going in now 2U platelets 8U PRBCs 5u FFP  VTE PROPHYLAXIS: SCDs to the bilateral lower extremities  ANTIBIOTICS: Ancef 2gm and Azithromycin 500mg  IV x 1   SPECIMENS: Uterus  DISPOSITION: PACU - guarded condition.  CONDITION: stable  COMPLICATIONS: DIC  FINDINGS: no intrabdominal adhesions. Grossly normal uterus, tubes and ovaries. Normal bladder with + jets from bilateral ureteral orifices. No injury to the bladder.  DESCRIPTION OF PROCEDURE:  I was on back up call for Dr. Adrian Blackwater and was called and notified of uterine inversion and PPH. After arriving to the hospital approximately 20-30 minutes later, the patient was already in the OR and prepped and draped. I scrubbed in and EUA done with large amounts of blood and clot in the vaginal vault, and I was unable to replace the uterus to its normal position. Decision was made to proceed with exploratory laparotomy. New gloves placed and vertical midline skin incision made and abdomen entered. Alexis retractor placed. Uterus was inverted and babcocks placed at the bilateral uterine ovarian junction and at the fundus and the inversion was undone. Uterus was boggy after being returned to it's normal anatomic position . Patient had already received Lysteda, methergine, and pitocin prior to the OR. Lysteda ordered again and IM Pitocin injected in to the uterus in addition to massage. Uterus still boggy and with vaginal bleeding. Two B-Lynch sutures  of #1 chromic placed but uterus was still boggy. Bakri balloon was then placed by Dr. Adrian Blackwater and inflated with of fluid. Massage continued and blood products being replaced but uterus still boggy and with continued vaginal bleeding. Patient's bleeding felt to be a combination of medical bleeding (DIC) and atony that was not improving. Medical bleeding being addressed with blood products but atony continued. Given this, decision was made to proceed with hysterectomy.  The bladder flap was created and then the bilateral round and uterine ovarian ligaments were grasped with large kelly clamps. These were transected and suture ligated with 0-vicryl suture. Heaney clamps were then used to clamp and cut the uterine arteries with 0-vicryl and the uterus was amputated from the cervix with cautery on cut. The cervical stump was oversewn with 0-vicryl suture. Multiple figure of eight sutures were then placed. With hemostasis noted from the operative pedicles.  New gloves donned and the cystoscopy was then done with the above noted findings.   New gloves donned, and the fascia was then closed with bilateral looped 0-PDS careful to avoid the muscle and to incorporate the peritoneum. The suture was then tied in the middle  The skin was then closed with staples.     Hemostasis was secured throughout the entire layers. Instrument counts were correct x 2 and the patient was taken to the recovery room in stable condition and intubated with plans to transfer to the ICU for postoperative care.   Cornelia Copa MD Attending Center for [employer] [facility])

## 2018-11-23 NOTE — Anesthesia Postprocedure Evaluation (Signed)
Anesthesia Post Note  Patient: Makayla Fletcher  Procedure(s) Performed: DILATATION AND CURETTAGE (N/A ) EXPLORATORY LAPAROTOMY (N/A ) HYSTERECTOMY ABDOMINAL (N/A ) CYSTOSCOPY (N/A ) EXAM UNDER ANESTHESIA (N/A )     Patient location during evaluation: ICU Anesthesia Type: General Level of consciousness: patient remains intubated per anesthesia plan Pain management: pain level controlled Vital Signs Assessment: post-procedure vital signs reviewed and stable Respiratory status: patient on ventilator - see flowsheet for VS Cardiovascular status: blood pressure returned to baseline and stable Postop Assessment: no apparent nausea or vomiting Anesthetic complications: no    Last Vitals:  Vitals:   11/23/18 0527 11/23/18 0933  BP: (!) 73/44 (!) 147/97  Pulse: (!) 167 (!) 101  Resp:  17  Temp:    SpO2:  100%    Last Pain:  Vitals:   11/23/18 0520  TempSrc: Axillary  PainSc:    Pain Goal:                   Lowella Curb

## 2018-11-23 NOTE — Anesthesia Procedure Notes (Signed)
Procedure Name: Intubation Date/Time: 11/23/2018 5:58 AM Performed by: Trellis Paganini, CRNA Pre-anesthesia Checklist: Patient identified, Emergency Drugs available, Suction available and Patient being monitored Patient Re-evaluated:Patient Re-evaluated prior to induction Oxygen Delivery Method: Circle system utilized Preoxygenation: Pre-oxygenation with 100% oxygen Induction Type: Rapid sequence, IV induction and Cricoid Pressure applied Grade View: Grade I Tube type: Oral Tube size: 7.0 mm Number of attempts: 1 Airway Equipment and Method: Stylet and Oral airway Placement Confirmation: ETT inserted through vocal cords under direct vision,  positive ETCO2 and breath sounds checked- equal and bilateral Secured at: 21 cm Tube secured with: Tape Dental Injury: Teeth and Oropharynx as per pre-operative assessment

## 2018-11-23 NOTE — Anesthesia Preprocedure Evaluation (Addendum)
Anesthesia Evaluation  Patient identified by MRN, date of birth, ID band Patient awake    Reviewed: Allergy & Precautions, NPO status , Patient's Chart, lab work & pertinent test resultsPreop documentation limited or incomplete due to emergent nature of procedure.  Airway Mallampati: III  TM Distance: >3 FB Neck ROM: Full    Dental  (+) Teeth Intact, Dental Advisory Given   Pulmonary neg pulmonary ROS,    Pulmonary exam normal breath sounds clear to auscultation       Cardiovascular negative cardio ROS   Rhythm:Regular Rate:Tachycardia     Neuro/Psych  Headaches, PSYCHIATRIC DISORDERS Anxiety Depression    GI/Hepatic negative GI ROS, Neg liver ROS,   Endo/Other  negative endocrine ROS  Renal/GU negative Renal ROS     Musculoskeletal negative musculoskeletal ROS (+)   Abdominal   Peds  Hematology  (+) Blood dyscrasia, Sickle cell anemia and anemia ,   Anesthesia Other Findings Day of surgery medications reviewed with the patient.  Reproductive/Obstetrics S/p SVD, uterine inversion, uncontrolled bleeding post-delivery                             Anesthesia Physical Anesthesia Plan  ASA: V and emergent  Anesthesia Plan: General   Post-op Pain Management:    Induction: Intravenous  PONV Risk Score and Plan: 4 or greater and Dexamethasone, Ondansetron and Treatment may vary due to age or medical condition  Airway Management Planned: Oral ETT  Additional Equipment: Arterial line  Intra-op Plan:   Post-operative Plan: Possible Post-op intubation/ventilation  Informed Consent: I have reviewed the patients History and Physical, chart, labs and discussed the procedure including the risks, benefits and alternatives for the proposed anesthesia with the patient or authorized representative who has indicated his/her understanding and acceptance.     Dental advisory given and Only  emergency history available  Plan Discussed with: CRNA  Anesthesia Plan Comments: (Pre-op completed after induction due to emergent nature of procedure.)       Anesthesia Quick Evaluation

## 2018-11-23 NOTE — Progress Notes (Signed)
FOB and infant taken to nursery.

## 2018-11-23 NOTE — Progress Notes (Addendum)
Unit 1 emergency release PRBCs started.  Unit number [id number]

## 2018-11-23 NOTE — Progress Notes (Addendum)
Blood warmer started. Labs drawn. Pt talking and states that she is cold. O2 changed to non-rebreather mask. QBL now up to 2075.

## 2018-11-23 NOTE — Progress Notes (Signed)
Dr. Desmond Lope and CRNA remain at bedside.

## 2018-11-23 NOTE — Progress Notes (Signed)
Dr. Jolayne Panther paged concerning pt distended abdomen. Dr. Jolayne Panther at bedside to assess patient and changed dressing. Awaiting lab draw at 18:30. Pt received 2 prbc, 2 cyro, 2 plts, 5 ffp this shift. I-stat h/h undetectable Dr. Vergie Living aware and at bedside.

## 2018-11-23 NOTE — Transfer of Care (Signed)
Immediate Anesthesia Transfer of Care Note  Patient: Makayla Fletcher  Procedure(s) Performed: DILATATION AND CURETTAGE (N/A ) EXPLORATORY LAPAROTOMY (N/A ) HYSTERECTOMY ABDOMINAL (N/A ) CYSTOSCOPY (N/A ) EXAM UNDER ANESTHESIA (N/A )  Patient Location: PACU and ICU  Anesthesia Type:General  Level of Consciousness: sedated and Patient remains intubated per anesthesia plan  Airway & Oxygen Therapy: Patient remains intubated per anesthesia plan and Patient placed on Ventilator (see vital sign flow sheet for setting)  Post-op Assessment: Report given to RN and Post -op Vital signs reviewed and stable  Post vital signs: Reviewed and stable  Last Vitals:  Vitals Value Taken Time  BP 147/97 11/23/2018  9:33 AM  Temp    Pulse 100 11/23/2018  9:37 AM  Resp 16 11/23/2018  9:37 AM  SpO2 100 % 11/23/2018  9:37 AM  Vitals shown include unvalidated device data.  Last Pain:  Vitals:   11/23/18 0520  TempSrc: Axillary  PainSc:          Complications: No apparent anesthesia complications

## 2018-11-23 NOTE — Progress Notes (Signed)
Patient Name: Makayla Fletcher, female   DOB: Feb 22, 1996, 23 y.o.  MRN: [id number]  Patient had uterine inversion with delivery of placenta. Uterus reduced in room following redose of epidural by Dr Desmond Lope. Patient became unresponsive and apneic. Resuscitated and MTP started. Received 4 units PRBCs and 1 unit FFP. Recollected CBC. Continued to have bleeding and patient reexamined - Uterus feels to be partially inverted still. Discussed need to go to OR for reduction. Discussed possibility of hysterectomy if unable to manage bleeding. Consent signed. Ancef 2 grams preoperatively. 2 more units of PRBCs ordered, 2 FFP, 1 unit platelets. Also called my partner in to assist.  FOB updated.  Levie Heritage, DO 11/23/2018 5:39 AM

## 2018-11-23 NOTE — Progress Notes (Signed)
RT NOTES: Critical Hgb result of 5.8 given to [employer].

## 2018-11-23 NOTE — Progress Notes (Signed)
F/U note.  Spoke with OB.  INR 3.7, platelets 11 and Hg of 7.2.  Discussed with OB.  Will transfuse 4 units FFP, 2 FFP and 2 platelets.  Hemodynamics adjusted.  Propofol and fentanyl for sedation.  FP recommending breast feeding which I will defer to OB on the matter.  Maintain on full vent support for now.  The patient is critically ill with multiple organ systems failure and requires high complexity decision making for assessment and support, frequent evaluation and titration of therapies, application of advanced monitoring technologies and extensive interpretation of multiple databases.   Critical Care Time devoted to patient care services described in this note is 435  Minutes. This time reflects time of care of this signee Dr Koren Bound. This critical care time does not reflect procedure time, or teaching time or supervisory time of PA/NP/Med student/Med Resident etc but could involve care discussion time.  Alyson Reedy, M.D. [facility] Pulmonary/Critical Care Medicine. Pager: [phone]. After hours pager: [phone].

## 2018-11-23 NOTE — Discharge Summary (Signed)
Postpartum Discharge Summary     Patient Name: Makayla Fletcher DOB: 1995/07/14 MRN: [id number]  Date of admission: 11/22/2018 Delivering Provider: Aviva Signs   Date of discharge: 12/01/2018  Admitting diagnosis: preg Intrauterine pregnancy: [redacted]w[redacted]d     Secondary diagnosis:  Principal Problem:   S/P postpartum abdominal supracervical hysterectomy Active Problems:   Depression, major, single episode, moderate (HCC)   Generalized anxiety disorder   Sickle cell disease (HCC)   AKI (acute kidney injury) (HCC)   Abnormal transaminases   Postpartum hemorrhage   Hypovolemic shock (HCC)   Postoperative ileus (HCC)  Additional problems: Retained placenta, postpartum hemorrhage, Uterine inversion, hypovolemic shock     Discharge diagnosis: Term Pregnancy Delivered and Retained placenta, postpartum hemorrhage, uterine inversion and hypovolemic shock                                                                                                Post partum procedures:blood transfusion  Augmentation: Pitocin, Cytotec and Foley Balloon  Complications: Uterine Inversion, retained placenta, postpartum hemorrhage, hypovolemic shock  Hospital course:  Induction of Labor With Vaginal Delivery   23 y.o. yo G3P0020 at [redacted]w[redacted]d was admitted to the hospital 11/22/2018 for induction of labor.  Indication for induction: Sickle Cell anemia.  Patient had an uncomplicated labor course as follows: She received Cytotec and then a Foley Balloon.  As labor progressed Pitocin was added She made good progress in dilation and pushed for 1 hour and 26 minutes She spontaneously ruptured her membranes prior to delivery.  Membrane Rupture Time/Date: 2:00 AM ,11/23/2018   Intrapartum Procedures: Episiotomy: None [1]                                         Lacerations:  None [1]  Patient had delivery of a Viable infant on 11/23/2018. Details of delivery can be found in separate delivery note.  Information for the  patient's newborn:  Mercy Riding [[id number]]  Delivery Method: Vaginal, Spontaneous(Filed from Delivery Summary)    After delivery, the placenta detached except for a small part in center which had to be manually removed by Dr Adrian Blackwater.  Uterine inversion occurred as well as a postpartum hemorrhage.  She was treated for hypovolemic/hemorrhagic shock, as well as apnea,  with bleeding corrected during that process.Marland KitchenBPs were hypotensive and she was tachycardic in the 190s.    EBL was about .  Copied from Dr Denyce Robert notes: Patient had uterine inversion with delivery of placenta. Uterus reduced in room following redose of epidural by Dr Desmond Lope. Patient became unresponsive and apneic. Resuscitated and MTP started. Received 4 units PRBCs and 1 unit FFP. Recollected CBC. Continued to have bleeding and patient reexamined - Uterus feels to be partially inverted still. Discussed need to go to OR for reduction. Discussed possibility of hysterectomy if unable to manage bleeding.   She was taken to the OR for exploration.  She continued to have bleeding.  Multiple efforts were employed to stop bleeding after uterine exploration, but  to limited success.  Eventually a Supracervical Hysterectomy was done, see notes for more details.   After surgery, she was noted to be in DIC and was treated accordingly.  She was taken to the ICU for immediate care. Total EBL was 5700 ml; and over her course she received many liters of crystalloid, 4 units of Cryoprecipitate, 4 units of platelets, 8 units of PRBCs, and 9 units of FFP.  Patient was extubated in the ICU on POD#1.  Due to the severe hemorrhage and hypovolemic chock, patient had acute renal injury with peak Cr value of 4.5. Also sustained severe transaminatis, max AST/ALT was 2714/1073.  Also had significant thrombocytopenia to a nadir of 11K during the acute DIC requiring platelet transfusions.  All these abnormal values improved throughout her stay, see labs  below for values at the time of discharge.  Her cardiopulmonary status improved, had normal ECHO (also see below).    She had some HTN due to aggressive fluid resuscitation, got better over time.  Was also treated for possible pneumonia for a few days. Also had a mild ileus that resolvedShe was followed by several specialists and they all signed off after a few days as her clinical course improved.  She was transferred from ICU to Step Down Unit on 11/26/18.  Her thrombocytopenia impeded initial removed of her indwelling epidural that was placed during delivery, this was finally removed on 11/29/18. [facility]  She had no further complications, and recovered well. By time of discharge on POD#8, her pain was controlled on oral pain medications; she was ambulating, voiding without difficulty, tolerating regular diet and passing flatus.  Her incisional staples were removed, there was a 7 mm superficial dehiscence noted in the middle of the vertical incision after staple removal, reapproximated with steristrips. Honeycomb dressing placed.  She was deemed stable for discharge to home.  Appointments were made for her to be seen in the office next week, and also [facility] given her history of depression and also given the events of this hospitalization.   Patient is discharged home 12/01/18 in stable condition.    Physical exam  Vitals:   11/30/18 1926 12/01/18 0110 12/01/18 0519 12/01/18 0753  BP: 130/83 131/87 129/83 134/88  Pulse: (!) 113 (!) 125 (!) 110 98  Resp: 18 18  16   Temp: 98.8 F (37.1 C)  98.6 F (37 C) 99.2 F (37.3 C)  TempSrc: Oral  Oral Oral  SpO2: 99%  100% 98%  Weight:      Height:       General: alert, cooperative and no distress Lochia: appropriate Abdomen: NT, mildly distended, good bowel sounds in all four quadrants Incision: Healing well with no significant drainage. Staples removed,  7 mm superficial dehiscence noted in  the middle of the vertical incision after staple removal, reapproximated with steristrips. Honeycomb dressing placed. DVT Evaluation: No evidence of DVT seen on physical exam. Negative Homan's sign. No cords or calf tenderness. No significant calf/ankle edema.  Labs: CBC Latest Ref Rng & Units 12/01/2018 11/29/2018 11/28/2018  WBC 4.0 - 10.5 K/uL 10.0 9.7 10.4  Hemoglobin 12.0 - 15.0 g/dL 1.6(X9.3(L) 0.9(U9.8(L) 10.6(L)  Hematocrit 36.0 - 46.0 % 27.2(L) 29.0(L) 31.4(L)  Platelets 150 - 400 K/uL 115(L) 60(L) 49(L)   CMP Latest Ref Rng & Units 12/01/2018 11/30/2018 11/28/2018  Glucose 70 - 99 mg/dL 97 045(W110(H) 91  BUN 6 - 20 mg/dL 14 13 09(W26(H)  Creatinine 0.44 - 1.00 mg/dL 4.94(W) 9.67(R) 9.16(B)  Sodium 135 - 145 mmol/L 136 137 137  Potassium 3.5 - 5.1 mmol/L 3.4(L) 3.4(L) 3.6  Chloride 98 - 111 mmol/L 101 103 107  CO2 22 - 32 mmol/L 24 24 23   Calcium 8.9 - 10.3 mg/dL 8.4(Y) 6.5(L) 9.3(T)  Total Protein 6.5 - 8.1 g/dL 6.0(L) 5.9(L) -  Total Bilirubin 0.3 - 1.2 mg/dL 2.6(H) 3.0(H) -  Alkaline Phos 38 - 126 U/L 91 93 -  AST 15 - 41 U/L 50(H) 56(H) -  ALT 0 - 44 U/L 86(H) 115(H) -   Recent Results (from the past 70177 hour(s))  ECHOCARDIOGRAM COMPLETE   Collection Time: 11/25/18  3:24 PM  Result Value   Weight 2,462.1   Height 62   BP 118/92   Narrative     ECHOCARDIOGRAM REPORT       Patient Name:   [name] Date of Exam: 11/25/2018 Medical Rec #:  [id number]         Height:       62.0 in Accession #:    [id number]        Weight:       153.9 lb Date of Birth:  August 25, 1995         BSA:          1.71 m Patient Age:    22 years          BP:           122/88 mmHg Patient Gender: F                 HR:           124 bpm. Exam Location:  Inpatient    Procedure: 2D Echo, Cardiac Doppler and Color Doppler  Indications:    I51.7 Cardiomegaly   History:        Patient has prior history of Echocardiogram examinations, most                 recent 03/29/2014. Sickle Cell Anemia.   Sonographer:     Elmarie Shiley Dance Referring Phys: [id number] SARAH F GROCE  IMPRESSIONS    1. The left ventricle has normal systolic function, with an ejection fraction of 55-60%. The cavity size was normal. Indeterminate diastolic filling due to E-A fusion.  2. Trivial pericardial effusion is present.  3. The mitral valve is abnormal. Mild thickening of the mitral valve leaflet. There is moderate mitral annular calcification present.  4. The tricuspid valve is grossly normal.  5. The aortic root is normal in size and structure.  6. The interatrial septum was not assessed.  FINDINGS  Left Ventricle: The left ventricle has normal systolic function, with an ejection fraction of 55-60%. The cavity size was normal. There is no increase in left ventricular wall thickness. Indeterminate diastolic filling due to E-A fusion.  Right Ventricle: The right ventricle has normal systolic function. The cavity was normal. There is right vetricular wall thickness was not assessed.  Left Atrium: Left atrial size was normal in size.  Right Atrium: Right atrial size was normal in size. Right atrial pressure is estimated at 3 mmHg.  Interatrial Septum: The interatrial septum was not assessed.  Pericardium: Trivial pericardial effusion is present.  Mitral Valve: The mitral valve is abnormal. Mild thickening of the mitral valve leaflet. There is moderate mitral annular calcification present. Mitral valve regurgitation is trivial by color flow Doppler.  Tricuspid Valve: The tricuspid valve is grossly normal.  Tricuspid valve regurgitation was not visualized by color flow Doppler.  Aortic Valve: The aortic valve is normal in structure. Aortic valve regurgitation was not visualized by color flow Doppler.  Pulmonic Valve: The pulmonic valve was grossly normal. Pulmonic valve regurgitation is not visualized by color flow Doppler.  Aorta: The aortic root is normal in size and structure.  Venous: The inferior vena cava is normal in size  with greater than 50% respiratory variability.    +--------------+--------++ LEFT VENTRICLE         +----------------+----------++ +--------------+--------++ Diastology                 PLAX 2D                +----------------+----------++ +--------------+--------++ LV e' lateral:  11.20 cm/s LVIDd:        4.55 cm  +----------------+----------++ +--------------+--------++ LV E/e' lateral:7.9        LVIDs:        3.35 cm  +----------------+----------++ +--------------+--------++ LV e' medial:   7.83 cm/s  LV PW:        1.04 cm  +----------------+----------++ +--------------+--------++ LV E/e' medial: 11.4       LV IVS:       0.65 cm  +----------------+----------++ +--------------+--------++ LVOT diam:    2.10 cm  +--------------+--------++ LV SV:        49 ml    +--------------+--------++ LV SV Index:  27.77    +--------------+--------++ LVOT Area:    3.46 cm +--------------+--------++                        +--------------+--------++  +---------------+----------++ RIGHT VENTRICLE           +---------------+----------++ RV Basal diam: 2.53 cm    +---------------+----------++ RV S prime:    16.40 cm/s +---------------+----------++ TAPSE (M-mode):2.3 cm     +---------------+----------++  +---------------+-------++-----------++ LEFT ATRIUM           Index       +---------------+-------++-----------++ LA diam:       2.80 cm1.64 cm/m  +---------------+-------++-----------++ LA Vol (A2C):  49.0 ml28.65 ml/m +---------------+-------++-----------++ LA Vol (A4C):  37.9 ml22.16 ml/m +---------------+-------++-----------++ LA Biplane Vol:43.0 ml25.14 ml/m +---------------+-------++-----------++ +------------+---------++-----------++ RIGHT ATRIUM         Index       +------------+---------++-----------++ RA Area:    14.90 cm             +------------+---------++-----------++ RA Volume:  37.20 ml 21.75 ml/m +------------+---------++-----------++  +------------+-----------++ AORTIC VALVE            +------------+-----------++ LVOT Vmax:  79.10 cm/s  +------------+-----------++ LVOT Vmean: 41.800 cm/s +------------+-----------++ LVOT VTI:   0.107 m     +------------+-----------++   +-------------+-------++ AORTA                +-------------+-------++ Ao Root diam:2.90 cm +-------------+-------++ Ao Asc diam: 2.30 cm +-------------+-------++  +--------------+----------++ MITRAL VALVE             +--------------+-------+ +--------------+----------++ SHUNTS                MV Area (PHT):6.63 cm   +--------------+-------+ +--------------+----------++ Systemic VTI: 0.11 m  MV PHT:       33.20 msec +--------------+-------+ +--------------+----------++ Systemic Diam:2.10 cm MV Decel Time:115 msec   +--------------+-------+ +--------------+----------++ +--------------+----------++ MV E velocity:89.00 cm/s +--------------+----------++    Dietrich Pates MD Electronically signed by Dietrich Pates MD Signature Date/Time: 11/25/2018/7:48:22 PM       Final     *Note:  Due to a large number of results and/or encounters for the requested time period, some results have not been displayed. A complete set of results can be found in Results Review.    Discharge instruction: per After Visit Summary and "Baby and Me Booklet".  After visit meds:  Allergies as of 12/01/2018   No Known Allergies     Medication List    STOP taking these medications   hydrocortisone 25 MG suppository Commonly known as: ANUSOL-HC     TAKE these medications   bisacodyl 10 MG suppository Commonly known as: Dulcolax Place 1 suppository (10 mg total) rectally as needed for moderate constipation or severe constipation.   docusate sodium 100 MG capsule Commonly known as:  COLACE Take 1 capsule (100 mg total) by mouth 2 (two) times daily as needed for mild constipation or moderate constipation.   ferrous sulfate 325 (65 FE) MG tablet TAKE 1 TABLET BY MOUTH TWICE A DAY   folic acid 1 MG tablet Commonly known as: FOLVITE TAKE 1 TABLET (1 MG TOTAL) BY MOUTH DAILY.   Oxycodone HCl 10 MG Tabs Take 1 tablet (10 mg total) by mouth every 6 (six) hours as needed for moderate pain, severe pain or breakthrough pain.   polyethylene glycol 17 g packet Commonly known as: MIRALAX / GLYCOLAX Take 17 g by mouth daily as needed for moderate constipation or severe constipation.   prenatal vitamin w/FE, FA 27-1 MG Tabs tablet Take 1 tablet by mouth daily at 12 noon.   simethicone 80 MG chewable tablet Commonly known as: MYLICON Chew 1 tablet (80 mg total) by mouth 4 (four) times daily as needed for flatulence.            Discharge Care Instructions  (From admission, onward)         Start     Ordered   12/01/18 0000  Discharge wound care:    Comments: As per discharge handout and nursing instructions   12/01/18 0953          Diet: routine diet  Activity: Advance as tolerated. Pelvic rest for 6 weeks.    Future Appointments  Date Time Provider Department Center  12/09/2018 10:30 AM Allie Bossier, MD [facility] None  12/22/2018 10:00 AM Conan Bowens, MD [facility] None  01/23/2019  2:00 PM Mike Gip, FNP [facility] None  Message also sent to schedule mood check appointment with [facility] counselor  Newborn Data: Live born female  Birth Weight:   APGAR: 8, 9  Newborn Delivery   Birth date/time:  11/23/2018 02:49:00 Delivery type:  Vaginal, Spontaneous     Baby Feeding: Breast Disposition:home with mother   12/01/2018 Jaynie Collins, MD

## 2018-11-23 NOTE — Progress Notes (Signed)
Called by RN to evaluate patient with distended abdomen. Patient is found intubated and sedated but responsive to painful stimuli  Vitals:   11/23/18 1546 11/23/18 1547 11/23/18 1601 11/23/18 1632  BP:  124/76 133/89   Pulse:  (!) 121 (!) 127 (!) 129  Resp:  20 (!) 21 (!) 0  Temp:  99.4 F (37.4 C)  99.1 F (37.3 C)  TempSrc:  Oral  Oral  SpO2: 100% 100% 100% 100%  Weight:      Height:         Intake/Output Summary (Last 24 hours) at 11/23/2018 1656 Last data filed at 11/23/2018 1632 Gross per 24 hour  Intake 11516.09 ml  Output 8015 ml  Net 3501.09 ml   Patient averages 30cc/hour of urine since admission in the ICU  GENERAL: Intubated and sedated.  ABDOMEN: Distended. Saturated dressing with some oozing from midline incision. PELVIC: No vaginal bleeding Bedside ultrasound demonstrates the presence of blood in abdomen  A/P 23 yo [id number] s/p SVD complicated with PPH/DIC/SCH/Cysto - Patient received a total of 10 pRBC, 8 FFP, 6 Cryo, 4 Platelets - Follow up repeat labs at 6:30p - Dressing changed - Difficult to determine if old blood in abdomen or active bleeding at this time. Will wait for labs - IR made aware of patient who recommended CT angio abd/pelvis to rule out active bleeding

## 2018-11-23 NOTE — Progress Notes (Addendum)
Unit 4 emergency release PRBCs started.  Unit number [id number]

## 2018-11-23 NOTE — Progress Notes (Signed)
Staff members responding to emergency. Carney Living, primary RN. Verne Grain, OB Rapid Response. Darrow Bussing, Press photographer. Wynelle Bourgeois, CNM. Dr. Adrian Blackwater, Faculty Attending. Dr. Desmond Lope, Anesthesia. Juan Quam, CRNA. Emmaline Kluver, CRNA. Reginold Agent, RN. Conley Simmonds, House Coverage. Catie Lendell Caprice, Charity fundraiser. Sunday Spillers, RN. Dianna Limbo, RN. Lupita Shutter [place], ST. Dellis Filbert, Rapid Response RN.

## 2018-11-23 NOTE — Progress Notes (Signed)
See Dr. Deretha Emory note. D/w Dr. Jolayne Panther.  I-stat h/h states undetectable, it was drawn off the a-line and the machine gave incorrect results earlier today. Earliest can draw labs is at 1830. Pt has finished receiving all the blood products (2 prbc, 2 cryo, 2 plts, 5 ffp)  Mother and father of baby updated   Cornelia Copa MD Attending Center for [employer] (Faculty Practice) 11/23/2018 Time: [phone]

## 2018-11-23 NOTE — Progress Notes (Signed)
Postoperative Note  Admission Date: 11/22/2018 Current Date: 11/23/2018 12:26 PM  Makayla Fletcher is a 23 y.o. [id number] POD#0 EUA, supracervical hysterectomy for [facility] @ 39wks, admitted for hemoglobin [place].  Pregnancy complicated by: Patient Active Problem List   Diagnosis Date Noted  . Sickle cell disease (HCC) 11/22/2018  . Encounter for induction of labor 11/22/2018  . Maternal sickle cell anemia affecting pregnancy, antepartum (HCC) 05/11/2018  . Supervision of normal first pregnancy, antepartum 05/04/2018  . Vitamin D deficiency 06/06/2015  . Migraine without aura 12/18/2013  . 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 Sickle cell anemia (HCC) 06/17/2012  . Depression, major, single episode, moderate (HCC) 07/31/2011  . Generalized anxiety disorder 07/31/2011    Overnight/24hr events:  n/a  Subjective:  intubated  Objective:    Current Vital Signs 24h Vital Sign Ranges  T 98.3 F (36.8 C) Temp  Avg: 98.9 F (37.2 C)  Min: 97.1 F (36.2 C)  Max: 101.8 F (38.8 C)  BP (!) 85/64 BP  Min: 52/34  Max: 158/99  HR (!) 136 Pulse  Avg: 133.2  Min: 77  Max: 280  RR (!) 21 Resp  Avg: 18.5  Min: 16  Max: 22  SaO2 100 % Ventilator SpO2  Avg: 99.8 %  Min: 97 %  Max: 100 %       24 Hour I/O Current Shift I/O  Time Ins Outs 06/02 0701 - 06/03 0700 In: 3745 [I.V.:2200] Out: 5279 [Urine:1350] 06/03 0701 - 06/03 1900 In: 4764 [I.V.:2600] Out: 2536 [Urine:1150]   Patient Vitals for the past 24 hrs:  BP Temp Temp src Pulse Resp SpO2 Height Weight  11/23/18 1212 (!) 85/64 - - (!) 136 (!) 21 100 % - -  11/23/18 1145 93/72 98.3 F (36.8 C) - (!) 130 19 100 % - -  11/23/18 1122 - 98.3 F (36.8 C) Oral - - - - -  11/23/18 1115 - - - (!) 119 20 100 % - -  11/23/18 1114 105/77 - - (!) 123 (!) 22 100 % - -  11/23/18 1100 119/87 - - (!) 119 (!) 22 100 % - -  11/23/18 1045 - - - (!) 121 17 100 % - -  11/23/18 1030 - - - (!) 120 18 100 % - -  11/23/18 1015 - - -  (!) 120 19 100 % - -  11/23/18 1000 (!) 131/96 - - (!) 109 16 100 % - 68.2 kg  11/23/18 0945 - - - (!) 105 18 100 % - -  11/23/18 0933 (!) 147/97 - - (!) 101 17 100 %  (1.575 m) -  11/23/18 0527 (!) 73/44 - - (!) 167 - - - -  11/23/18 0525 (!) 72/43 - - (!) 164 - - - -  11/23/18 0523 (!) 78/51 - - (!) 161 - - - -  11/23/18 0521 (!) 82/46 - - (!) 160 - - - -  11/23/18 0520 (!) 69/50 99.4 F (37.4 C) Axillary (!) 163 - - - -  11/23/18 0517 (!) 61/31 - - (!) 172 - - - -  11/23/18 0516 (!) 52/34 - - (!) 169 - - - -  11/23/18 0515 (!) 82/52 - - (!) 167 - - - -  11/23/18 0512 (!) 76/49 - - (!) 166 - - - -  11/23/18 0506 (!) 92/54 - - (!) 148 - - - -  11/23/18 0501 (!) 92/56 - - (!) 149 - - - -  11/23/18 0458 (!) 106/57 - - (!) 142 - - - -  11/23/18 0451 (!) 107/59 - - (!) 136 - - - -  11/23/18 0450 - 99.7 F (37.6 C) Axillary - - - - -  11/23/18 0446 104/61 - - (!) 136 - - - -  11/23/18 0441 (!) 98/52 - - (!) 152 - - - -  11/23/18 0436 (!) 79/42 - - (!) 153 - - - -  11/23/18 0431 95/65 - - (!) 169 - - - -  11/23/18 0430 (!) 63/29 - - (!) 163 - - - -  11/23/18 0423 - 98.1 F (36.7 C) - - - - - -  11/23/18 0421 102/90 - - (!) 102 - - - -  11/23/18 0417 (!) 158/99 - - 94 - - - -  11/23/18 0416 (!) 147/126 - - (!) 149 - - - -  11/23/18 0414 94/62 - - (!) 142 - - - -  11/23/18 0412 - 98.9 F (37.2 C) Axillary - - 100 % - -  11/23/18 0411 (!) 79/47 - - (!) 188 - - - -  11/23/18 0410 - - - - - 100 % - -  11/23/18 0409 (!) 72/41 - - (!) 198 - - - -  11/23/18 0406 (!) 62/31 (!) 97.1 F (36.2 C) Axillary (!) 179 - - - -  11/23/18 0405 - - - - - 97 % - -  11/23/18 0402 (!) 54/16 - - (!) 151 - - - -  11/23/18 0356 107/76 - - (!) 280 - - - -  11/23/18 0354 (!) 55/31 - - (!) 181 - - - -  11/23/18 0351 (!) 68/52 - - - - - - -  11/23/18 0347 (!) 56/40 - - (!) 199 - - - -  11/23/18 0343 96/76 - - (!) 139 - - - -  11/23/18 0341 (!) 67/33 - - (!) 183 - - - -  11/23/18 0340 (!) 58/34 - - (!)  190 - - - -  11/23/18 0338 (!) 84/45 - - (!) 191 - - - -  11/23/18 0331 101/79 - - (!) 261 - - - -  11/23/18 0316 108/79 - - - - - - -  11/23/18 0311 - 99.6 F (37.6 C) Oral - - - - -  11/23/18 0301 135/66 - - (!) 123 - - - -  11/23/18 0258 133/69 - - (!) 129 - - - -  11/23/18 0231 131/79 - - (!) 117 - - - -  11/23/18 0212 130/81 - - (!) 109 - - - -  11/23/18 0131 133/86 (!) 101.8 F (38.8 C) Oral (!) 121 - - - -  11/23/18 0101 (!) 140/95 - - (!) 122 - - - -  11/23/18 0031 126/86 - - (!) 128 - - - -  11/23/18 0003 126/88 - - (!) 125 18 - - -  11/22/18 2331 119/87 - - (!) 119 - - - -  11/22/18 2300 128/83 - - (!) 105 18 - - -  11/22/18 2230 107/77 98.7 F (37.1 C) Oral (!) 111 16 - - -  11/22/18 2200 (!) 103/58 - - (!) 108 - - - -  11/22/18 2130 125/81 - - (!) 109 - - - -  11/22/18 2100 120/82 - - (!) 106 18 - - -  11/22/18 2030 120/81 - - 97 17 - - -  11/22/18 2000 (!) 101/54 - - 77 17 - - -  11/22/18 1930 118/83 98.8 F (37.1 C) Oral 98 16 - - -  11/22/18 1901 109/80 - - 99 18 - - -  11/22/18 1844 120/75 - - 98 18 - - -  11/22/18 1831 105/70 - - 93 18 - - -  11/22/18 1801 104/67 - - 94 20 - - -  11/22/18 1746 117/74 - - (!) 101 18 - - -  11/22/18 1741 120/70 - - 92 17 - - -  11/22/18 1734 118/79 - - 96 20 100 % - -  11/22/18 1731 113/70 - - 96 18 - - -  11/22/18 1726 119/78 - - (!) 103 20 - - -  11/22/18 1721 110/74 - - 99 18 - - -  11/22/18 1716 117/74 - - (!) 101 20 - - -  11/22/18 1713 120/77 - - (!) 103 20 100 % - -  11/22/18 1619 128/85 - - 95 18 - - -  11/22/18 1524 126/81 98.2 F (36.8 C) Oral 92 20 - - -  11/22/18 1419 116/68 - - 97 20 - - -  11/22/18 1347 115/78 - - 95 16 - - -  11/22/18 1305 116/79 - - 99 20 - - -   UOP in foley Physical exam: General: intubated, sedated Abdomen: saturated honeycomb dressing. Vertical midline with staples, intact. Mild oozing from midline of incision. GU: no VB  Medications: Current Facility-Administered  Medications  Medication Dose Route Frequency Provider Last Rate Last Dose  . 0.9 %  sodium chloride infusion (Manually program via Guardrails IV Fluids)   Intravenous Once Alyson Reedy, MD      . 0.9 %  sodium chloride infusion (Manually program via Guardrails IV Fluids)   Intravenous Once Excel Bing, MD      . calcium gluconate 1 g/ 50 mL sodium chloride IVPB  1 g Intravenous Once Bowser, Kaylyn Layer, NP      . coconut oil  1 application Topical PRN Levie Heritage, DO      . witch hazel-glycerin (TUCKS) pad 1 application  1 application Topical PRN Levie Heritage, DO       And  . dibucaine (NUPERCAINAL) 1 % rectal ointment 1 application  1 application Rectal PRN Levie Heritage, DO      . fentaNYL (SUBLIMAZE) injection 50 mcg  50 mcg Intravenous Q15 min PRN Alyson Reedy, MD      . fentaNYL (SUBLIMAZE) injection 50-200 mcg  50-200 mcg Intravenous Q30 min PRN Alyson Reedy, MD      . lactated ringers infusion   Intravenous Continuous Levie Heritage, DO 125 mL/hr at 11/23/18 1104    . misoprostol (CYTOTEC) 200 MCG tablet           . phenylephrine 0.4-0.9 MG/10ML-% injection           . propofol (DIPRIVAN) 1000 MG/100ML infusion  0-50 mcg/kg/min Intravenous Continuous Alyson Reedy, MD 16.37 mL/hr at 11/23/18 1105 40 mcg/kg/min at 11/23/18 1105  . [START ON 11/24/2018] Tdap (BOOSTRIX) injection 0.5 mL  0.5 mL Intramuscular Once Levie Heritage, DO        Labs:  Recent Labs  Lab 11/23/18 0422 11/23/18 0640  11/23/18 0829 11/23/18 1027 11/23/18 1034  WBC 19.6* 21.3*  --   --  10.0  --   HGB 7.8* 8.0*   < > 7.5* 7.3* 5.8*  HCT 22.8* 24.1*   < > 22.0* 21.2* 17.0*  PLT 50* 66*  68*  --   --  11*  --    < > = values in this interval not displayed.    Recent Labs  Lab 11/23/18 0829 11/23/18 1027 11/23/18 1034  NA 141 141 141  K 4.3 4.8 4.8  CL  --  110  --   CO2  --  20*  --   BUN  --  7  --   CREATININE  --  1.02*  --   CALCIUM  --  7.1*  --   PROT  --  3.4*  --    BILITOT  --  4.9*  --   ALKPHOS  --  48  --   ALT  --  349*  --   AST  --  663*  --   GLUCOSE  --  116*  --    Results for TYLEA, HISE (MRN [id number]) as of 11/23/2018 12:32  Ref. Range 11/23/2018 10:34  Sample type Unknown ARTERIAL  pH, Arterial Latest Ref Range: 7.350 - 7.450  7.327 (L)  pCO2 arterial Latest Ref Range: 32.0 - 48.0 mmHg 40.6  pO2, Arterial Latest Ref Range: 83.0 - 108.0 mmHg 133.0 (H)  TCO2 Latest Ref Range: 22 - 32 mmol/L 23  Acid-base deficit Latest Ref Range: 0.0 - 2.0 mmol/L 4.0 (H)  Bicarbonate Latest Ref Range: 20.0 - 28.0 mmol/L 21.4  O2 Saturation Latest Units: % 99.0  Patient temperature Unknown 97.9 F  Collection site Unknown RADIAL, ALLEN'S TEST ACCEPTABLE  Sodium Latest Ref Range: 135 - 145 mmol/L 141  Potassium Latest Ref Range: 3.5 - 5.1 mmol/L 4.8  Calcium Ionized Latest Ref Range: 1.15 - 1.40 mmol/L 1.03 (L)  Hemoglobin Latest Ref Range: 12.0 - 15.0 g/dL 5.8 (LL)  HCT Latest Ref Range: 36.0 - 46.0 % 17.0 (L)   Results for SARALYNN, LANGHORST (MRN [id number]) as of 11/23/2018 12:32  Ref. Range 11/23/2018 10:27  WBC Latest Ref Range: 4.0 - 10.5 K/uL 10.0  RBC Latest Ref Range: 3.87 - 5.11 MIL/uL 2.39 (L)  Hemoglobin Latest Ref Range: 12.0 - 15.0 g/dL 7.3 (L)  HCT Latest Ref Range: 36.0 - 46.0 % 21.2 (L)  MCV Latest Ref Range: 80.0 - 100.0 fL 88.7  MCH Latest Ref Range: 26.0 - 34.0 pg 30.5  MCHC Latest Ref Range: 30.0 - 36.0 g/dL 08.6  RDW Latest Ref Range: 11.5 - 15.5 % 14.0  Platelets Latest Ref Range: 150 - 400 K/uL 11 (LL)  nRBC Latest Ref Range: 0.0 - 0.2 % 2.2 (H)  Fibrinogen Latest Ref Range: 210 - 475 mg/dL 578  Prothrombin Time Latest Ref Range: 11.4 - 15.2 seconds 36.1 (H)  INR Latest Ref Range: 0.8 - 1.2  3.7 (H)  APTT Latest Ref Range: 24 - 36 seconds 91 (H)  Glucose Latest Ref Range: 70 - 99 mg/dL 469 (H)    Radiology:  pCXR showed patchy right basilar infiltrate laterally. Endotracheal tube in satisfactory position.  Assessment  & Plan:  Patient still in DIC D/w with ICU attending. Patient still in DIC. Sill transfuse: 3 FFP 2 PRBC 2 platelets 2 cryoprecipitate  Will also get additional FFP, PRBCs and platelets on hold  Patient has medical bleeding and needs to be corrected. Situation d/w blood bank  Dr. Jolayne Panther updated. Please call [phone] with any issues.   Cornelia Copa MD Attending Center for [facility])

## 2018-11-23 NOTE — Brief Op Note (Signed)
11/22/2018 - 11/23/2018  8:55 AM  PATIENT:  Makayla Fletcher  23 y.o. female  PRE-OPERATIVE DIAGNOSIS:  post partum manual exploration; d&c  POST-OPERATIVE DIAGNOSIS:  DIC  PROCEDURE:  Exam under anesthesia, supracervical hysterectomy, cystoscopy SURGEON:  Surgeon(s) and Role: Panel 1:    Chamberlain Bing, MD - Primary    * Levie Heritage, DO - Assisting  ANESTHESIA:   general  EBL:  on L&D and in the OR  BLOOD ADMINISTERED: crystalloid 2U cryo and 2U going in now 2U platelets 8U PRBCs 5u ffp  DRAINS: FOLEY UOP   LOCAL MEDICATIONS USED:  NONE  SPECIMEN:  Source of Specimen:  uterus  DISPOSITION OF SPECIMEN:  PATHOLOGY  COUNTS:  YES  TOURNIQUET:  * No tourniquets in log *  DICTATION: .Note written in EPIC  PLAN OF CARE: Admit to inpatient   PATIENT DISPOSITION:  PACU - hemodynamically stable.   Delay start of Pharmacological VTE agent (>24hrs) due to surgical blood loss or risk of bleeding: yes

## 2018-11-23 NOTE — Progress Notes (Signed)
I am caring for baby girl as part of FM team. Mom's intention was to breastfeed, I would encourage reaching out to lactation when ICU/OB team feels mom is hemodynamically stable to do so (other centers have pumped for moms while intubated, this alone is not a contraindication). Medications that mom has been given can be checked for risks and benefits via the Infant Risk Hotline (1(806) (262)[phone]) or Infant Risk App, which I have shown to pharmacy. This data is based out of [facility] and enumerates the degree to which meds have been studied in infants and which meds may be secreted into breastmilk. When mom is able to respond, the team (either FM for baby or OB for mom) could also consider asking if she would be willing to use donor breastmilk, which we may be able to obtain from NICU.   Kensi's mom is Zadie Rhine, and would really appreciate updates on her cell [phone].

## 2018-11-23 NOTE — Progress Notes (Signed)
Dr. Adrian Blackwater out of room to update FOB.

## 2018-11-23 NOTE — Addendum Note (Signed)
Addendum  created 11/23/18 1443 by Cleda Clarks, CRNA   Intraprocedure Meds edited

## 2018-11-23 NOTE — Progress Notes (Signed)
Follow up - Critical Care Post op OBSTETRICS Note I am using the critical care template for this patient due to the severity of her current condition  Patient Details:    Makayla Fletcher is an 23 y.o. female.  Lines, Airways, Drains: Airway 7 mm (Active)  Secured at (cm) 23 cm 11/23/2018  7:26 PM  Measured From Lips 11/23/2018  7:26 PM  Secured Location Center 11/23/2018  7:26 PM  Secured By [employer] Tube Holder 11/23/2018  7:26 PM  Tube Holder Repositioned Yes 11/23/2018  7:26 PM  Site Condition Dry 11/23/2018  7:26 PM     Arterial Line 11/23/18 Left Radial (Active)  Site Assessment Clean;Dry;Intact 11/23/2018  5:00 PM  Art Line Waveform Appropriate 11/23/2018  5:00 PM  Art Line Interventions Zeroed and calibrated;Leveled;Connections checked and tightened;Flushed per protocol 11/23/2018  5:00 PM  Color/Movement/Sensation Capillary refill less than 3 sec 11/23/2018  5:00 PM  Dressing Type Transparent;Occlusive 11/23/2018  5:00 PM  Dressing Status Clean;Dry;Intact 11/23/2018  5:00 PM  Dressing Change Due 11/29/18 11/23/2018  5:00 PM     Epidural Catheter 11/22/18 (Active)     Urethral Catheter Makayla Fletcher  Non-latex 14 Fr. (Active)  Indication for Insertion or Continuance of Catheter Peri-operative use for selective surgical procedure - not to exceed 24 hours post-op 11/23/2018  8:00 PM  Site Assessment Clean;Intact 11/23/2018  8:05 AM  Catheter Maintenance Bag below level of bladder;Catheter secured;Drainage bag/tubing not touching floor;Insertion date on drainage bag;No dependent loops;Seal intact 11/23/2018  8:00 PM  Collection Container Standard drainage bag 11/23/2018 11:00 AM  Urinary Catheter Interventions Unclamped 11/23/2018 11:00 AM  Output (mL) 175 mL 11/23/2018  8:44 PM    Anti-infectives:  Anti-infectives (From admission, onward)   Start     Dose/Rate Route Frequency Ordered Stop   11/23/18 0630  azithromycin (ZITHROMAX) 500 mg in sodium chloride 0.9 % 250 mL IVPB  Status:  Discontinued     500 mg 250 mL/hr over 60 Minutes Intravenous  Once 11/23/18 0620 11/23/18 0949   11/23/18 0345  ceFAZolin (ANCEF) IVPB 2g/100 mL premix  Status:  Discontinued     2 g 200 mL/hr over 30 Minutes Intravenous  Once 11/23/18 0343 11/23/18 [id number]      Microbiology: Results for orders placed or performed during the hospital encounter of 11/22/18  MRSA PCR Screening     Status: None   Collection Time: 11/23/18 10:56 AM  Result Value Ref Range Status   MRSA by PCR NEGATIVE NEGATIVE Final    Comment:        The GeneXpert MRSA Assay (FDA approved for NASAL specimens only), is one component of a comprehensive MRSA colonization surveillance program. It is not intended to diagnose MRSA infection nor to guide or monitor treatment for MRSA infections. Performed at [facility], [address] [place], [place] [address]     Best Practice/Protocols:  VTE Prophylaxis: Mechanical   Events:   Studies: CT angio report discussed with IR attending, report to follow  Consults:    Subjective:    Pt is responsive to stimuli  Objective:  Vital signs for last 24 hours: Temp:  [97.1 F (36.2 C)-101.8 F (38.8 C)] 101.2 F (38.4 C) (06/03 2107) Pulse Rate:  [94-280] 153 (06/03 2107) Resp:  [0-26] 26 (06/03 2107) BP: (52-158)/(16-126) 123/74 (06/03 2100) SpO2:  [97 %-100 %] 100 % (06/03 2107) Arterial Line BP: (76-147)/(59-92) 147/76 (06/03 2107) FiO2 (%):  [30 %-50 %] 30 % (06/03 2107) Weight:  [  68.2 kg] 68.2 kg (06/03 1000)  Hemodynamic parameters for last 24 hours:    Intake/Output from previous day: 06/02 0701 - 06/03 0700 In: 3745 [I.V.:2200; Blood:1545] Out: 5279 [Urine:1350; Blood:3929]  Intake/Output this shift: Total I/O In: 663.7 [I.V.:133.6; Blood:530] Out: 175 [Urine:175]  Vent settings for last 24 hours: Vent Mode: PRVC FiO2 (%):  [30 %-50 %] 30 % Set Rate:  [16 bmp] 16 bmp Vt Set:  [400 mL] 400 mL PEEP:  [5 cmH20] 5 cmH20 Plateau Pressure:  [10 cmH20-18  cmH20] 10 cmH20  Physical Exam:  Gen remains intubated post operatively responsive to minimal stimuli Abdomen distended secondary to blood in the abdomen from ongoing coagulopathic clinical picture EXT warm some edema  Assessment/Plan:   NEURO     Plan:   PULM  Intubated which will be maintained for now   Plan: with massive hemorrhage and transfusion concerned about fluid overload and ARDS picture which may evolve  CARDIO  Tachycardic and now hypertensive   Plan: continue transfusion as needed  RENAL  UOP remains adequate   Plan: creatinine reasonable although will probably increase after CT, continue to transfuse as required for renal perfusion  GI  S/P abdominal hyst with distention secondary to intra peritoneal oozing from on going cosumptive coagulopathy   Plan: s/p CT wghich show no discrete vessel bleeding which would be anmenable to IR intervention, just general oozing from consumptive coagulopathy  ID  As expected now febrile   Plan: most likely wil;l develop pelvic abscess with this hematoma formation  HEME  Consumptive coagulopathy/DIC   Plan: currently tranfusing 10 units platelets, 4 units FFP 4 untis PRBC and will continue as required  ENDO    Plan:   Global Issues      LOS: 1 day   Additional comments:I have reviewed clinical course, all labs from today, and CT angio with IR attending on call    Makayla Fletcher 11/23/2018  *Care during the described time interval was provided by me and/or other providers on the critical care team.  I have reviewed this patient's available data, including medical history, events of note, physical examination and test results as part of my evaluation. Patient ID: Makayla Fletcher, female   DOB: Apr 03, 1996, 23 y.o.   MRN: [id number]

## 2018-11-23 NOTE — Progress Notes (Addendum)
Pt now responsive. Was able to follow commands and stick tongue out for CRNA.

## 2018-11-23 NOTE — Progress Notes (Addendum)
ABG done by S. Charmian Muff, CRNA

## 2018-11-23 NOTE — Anesthesia Procedure Notes (Signed)
Arterial Line Insertion Start/End6/08/2018 5:46 AM, 11/23/2018 5:51 AM Performed by: Cecile Hearing, MD, anesthesiologist  Patient location: Pre-op. Preanesthetic checklist: patient identified, IV checked, site marked, risks and benefits discussed, surgical consent, monitors and equipment checked, pre-op evaluation, timeout performed and anesthesia consent Lidocaine 1% used for infiltration Left, radial was placed Catheter size: 20 Fr Hand hygiene performed  and maximum sterile barriers used   Attempts: 1 Procedure performed using ultrasound guided technique. Ultrasound Notes:anatomy identified, needle tip was noted to be adjacent to the nerve/plexus identified, no ultrasound evidence of intravascular and/or intraneural injection and image(s) printed for medical record Following insertion, dressing applied. Post procedure assessment: normal and unchanged  Patient tolerated the procedure well with no immediate complications.

## 2018-11-23 NOTE — Progress Notes (Signed)
Patient no longer responsive. Dr. Desmond Lope requests ambu bag. Additional staff in to assist and crash cart at bedside.

## 2018-11-23 NOTE — Op Note (Addendum)
Makayla Fletcher PROCEDURE DATE: 11/23/2018  PREOPERATIVE DIAGNOSES: Uterine inversion POSTOPERATIVE DIAGNOSES: Uterine inversion PROCEDURE: Manual replacement of uterus. SURGEON:  Dr. Candelaria Celeste ASSISTANT: Dr [place] Bing  INDICATIONS: 23 y.o. [id number] s/p vaginal delivery with retained placenta and a stage 3 uterine inversion.  Risks of surgery were discussed with the patient including but not limited to: bleeding which may require transfusion or reoperation; infection which may require antibiotics; injury to bowel, bladder, ureters or other surrounding organs; need for additional procedures; thromboembolic phenomenon, incisional problems and other postoperative/anesthesia complications. Written informed consent was obtained.    FINDINGS:  Uterine inversion  ANESTHESIA:    General Total INTRAVENOUS FLUIDS: 5240  ml ESTIMATED BLOOD LOSS: 1800 ml in OR and 3238 in Labor room URINE OUTPUT: 1450 ml SPECIMENS: Postpartum uterus COMPLICATIONS: None immediate  PROCEDURE IN DETAIL:   I was called to the room to assess the patient for retained placenta. Upon my entry, TXA was infusing and the patient's placenta was partially delivered. The patient pushed and the remainder of the placenta delivered. At the same time, the patient's uterus inverted. As the placenta was mostly detatched from the uterus, I completely removed the placenta, inspected the decidua for products of conception and found none. I called the anesthesiologist to the room and the patient's epidural was dosed to a surgical level. The uterus was grasped and reduced. Following the reduction, my hand stayed inside the uterus and the patient was given methergine. Following the reduction and the methergine, the patient's bleeding was under better control, but the patient became apneic with severe hypotension. The patient received multiple doses of phenylephrine and an MTP was initiated with the patient receiving 4 units PRBC, 2 FFP, 2  units of platelets.  The patient responded well, began to have spontaneous breathing and the bleeding was controlled at that time. After updating the patient's husband, the patient began to be hypotensive again and the patient's vaginal bleeding had increased. On examination, it felt that the patient's uterus had not completely reduced and was still a grade 2 inversion. At that time, I discussed with the patient the need to go to the OR for reduction and possible need for laparoscopy with a possible hysterectomy. Patient consented for surgery.    The patient was taken to the OR. The patient received intravenous Ancef and Azithromycin and had sequential compression devices applied to her lower extremities in the operating room. General anesthesia was administered and was found to be adequate.  She was placed in the dorsal lithotomy/supine position, and was prepped and draped in a sterile manner.  A Foley catheter had been previously inserted into her bladder and attached to constant drainage. After an adequate timeout was performed, attention was turned to uterus. The patient's uterus was still inverted. At that time, the patient was receiving additional units of blood, FFP and Platelets. At that time, Dr [place] Bing entered the room and assumed care of the patient, with me assisting him. Please see his note for details.  Makayla Heritage, DO 11/23/2018 9:26 AM

## 2018-11-23 NOTE — Progress Notes (Signed)
Dr. Adrian Blackwater feels uterus may still be partially inverted and discusses going to the OR with patient.

## 2018-11-23 NOTE — Progress Notes (Signed)
Patient ID: Makayla Fletcher, female   DOB: 11-30-1995, 23 y.o.   MRN: [id number] Comfortable Progressing nicely  Vitals:   11/22/18 2230 11/22/18 2300 11/22/18 2331 11/23/18 0003  BP: 107/77 128/83 119/87 126/88  Pulse: (!) 111 (!) 105 (!) 119 (!) 125  Resp: 16 18  18   Temp: 98.7 F (37.1 C)     TempSrc: Oral     SpO2:       FHR reassuring UCs every 2 min  Dilation: Lip/rim Effacement (%): 100 Station: 0 Presentation: Vertex Exam by:: Janeice Robinson, RN   Anticipate SVD soon

## 2018-11-23 NOTE — Progress Notes (Signed)
CRITICAL VALUE ALERT  Critical Value: hemoglobin 5.2  Date & Time Notied:  11/23/2018 @1910   Provider Notified: Dr. Despina Hidden   Orders Received/Actions taken: Blood products ordered

## 2018-11-23 NOTE — Anesthesia Postprocedure Evaluation (Signed)
Anesthesia Post Note  Patient: Makayla Fletcher  Procedure(s) Performed: AN AD HOC LABOR EPIDURAL     Patient location during evaluation: L&D Anesthesia Type: Epidural Level of consciousness: awake and alert Pain management: pain level controlled Vital Signs Assessment: post-procedure vital signs reviewed and stable Respiratory status: spontaneous breathing, nonlabored ventilation, respiratory function stable and patient connected to face mask oxygen Cardiovascular status: unstable Postop Assessment: no headache, no backache and epidural receding Anesthetic complications: no Comments: Patient with large post-partum bleed requiring trip to operating room following SVD.  Patient epidural had resolved, but VS unstable due to large, ongoing blood loss.  See operative record for further details.    Last Vitals:  Vitals:   11/23/18 1547 11/23/18 1601  BP: 124/76 133/89  Pulse: (!) 121 (!) 127  Resp: 20 (!) 21  Temp: 37.4 C   SpO2: 100% 100%    Last Pain:  Vitals:   11/23/18 1547  TempSrc: Oral  PainSc:    Pain Goal:                   Cecile Hearing

## 2018-11-23 NOTE — Progress Notes (Addendum)
Levert Feinstein, CRNA notified Dr. Adrian Blackwater that iStat showed hematocrit 15 but would not result a hemoglobin.

## 2018-11-24 ENCOUNTER — Encounter (HOSPITAL_COMMUNITY): Payer: Self-pay | Admitting: Obstetrics and Gynecology

## 2018-11-24 ENCOUNTER — Inpatient Hospital Stay (HOSPITAL_COMMUNITY): Payer: 59

## 2018-11-24 ENCOUNTER — Ambulatory Visit (HOSPITAL_COMMUNITY): Payer: 59

## 2018-11-24 DIAGNOSIS — G9340 Encephalopathy, unspecified: Secondary | ICD-10-CM

## 2018-11-24 DIAGNOSIS — J810 Acute pulmonary edema: Secondary | ICD-10-CM

## 2018-11-24 DIAGNOSIS — J690 Pneumonitis due to inhalation of food and vomit: Secondary | ICD-10-CM

## 2018-11-24 LAB — FIBRINOGEN
Fibrinogen: 335 mg/dL (ref 210–475)
Fibrinogen: 361 mg/dL (ref 210–475)
Fibrinogen: 409 mg/dL (ref 210–475)

## 2018-11-24 LAB — BPAM FFP
Blood Product Expiration Date: 202006072359
Blood Product Expiration Date: 202006072359
Blood Product Expiration Date: 202006072359
Blood Product Expiration Date: 202006072359
Blood Product Expiration Date: 202006072359
Blood Product Expiration Date: 202006072359
Blood Product Expiration Date: 202006072359
Blood Product Expiration Date: 202006072359
Blood Product Expiration Date: 202006072359
Blood Product Expiration Date: 202006082359
Blood Product Expiration Date: 202006082359
ISSUE DATE / TIME: 202006030416
ISSUE DATE / TIME: 202006030515
ISSUE DATE / TIME: 202006030515
ISSUE DATE / TIME: 202006030650
ISSUE DATE / TIME: 202006030650
ISSUE DATE / TIME: 202006030650
ISSUE DATE / TIME: 202006030650
ISSUE DATE / TIME: 202006031237
ISSUE DATE / TIME: 202006031237
ISSUE DATE / TIME: 202006031237
ISSUE DATE / TIME: 202006031953
Unit Type and Rh: 6200
Unit Type and Rh: 6200
Unit Type and Rh: 6200
Unit Type and Rh: 2800
Unit Type and Rh: 600
Unit Type and Rh: 6200
Unit Type and Rh: 8400
Unit Type and Rh: 8400
Unit Type and Rh: 8400
Unit Type and Rh: 8400
Unit Type and Rh: 8400

## 2018-11-24 LAB — PREPARE CRYOPRECIPITATE
Unit division: 0
Unit division: 0
Unit division: 0
Unit division: 0
Unit division: 0
Unit division: 0

## 2018-11-24 LAB — PREPARE FRESH FROZEN PLASMA
Unit division: 0
Unit division: 0
Unit division: 0
Unit division: 0
Unit division: 0
Unit division: 0
Unit division: 0
Unit division: 0
Unit division: 0
Unit division: 0
Unit division: 0

## 2018-11-24 LAB — GLUCOSE, CAPILLARY
Glucose-Capillary: 87 mg/dL (ref 70–99)
Glucose-Capillary: 76 mg/dL (ref 70–99)
Glucose-Capillary: 75 mg/dL (ref 70–99)
Glucose-Capillary: 67 mg/dL — ABNORMAL LOW (ref 70–99)
Glucose-Capillary: 90 mg/dL (ref 70–99)

## 2018-11-24 LAB — BPAM PLATELET PHERESIS
Blood Product Expiration Date: 202006042359
Blood Product Expiration Date: 202006052359
Blood Product Expiration Date: 202006052359
Blood Product Expiration Date: 202006041235
Blood Product Expiration Date: 202006062359
Blood Product Expiration Date: 202006062359
ISSUE DATE / TIME: 202006030533
ISSUE DATE / TIME: 202006030741
ISSUE DATE / TIME: 202006031149
ISSUE DATE / TIME: 202006031248
ISSUE DATE / TIME: 202006032256
ISSUE DATE / TIME: 202006031954
Unit Type and Rh: 5100
Unit Type and Rh: 6200
Unit Type and Rh: 7300
Unit Type and Rh: 6200
Unit Type and Rh: 6200
Unit Type and Rh: 9500

## 2018-11-24 LAB — CALCIUM, IONIZED: Calcium, Ionized, Serum: 4.1 mg/dL — ABNORMAL LOW (ref 4.5–5.6)

## 2018-11-24 LAB — PREPARE PLATELET PHERESIS
Unit division: 0
Unit division: 0
Unit division: 0
Unit division: 0
Unit division: 0
Unit division: 0

## 2018-11-24 LAB — CBC
HCT: 27.7 % — ABNORMAL LOW (ref 36.0–46.0)
HCT: 27 % — ABNORMAL LOW (ref 36.0–46.0)
HCT: 27.4 % — ABNORMAL LOW (ref 36.0–46.0)
Hemoglobin: 10 g/dL — ABNORMAL LOW (ref 12.0–15.0)
Hemoglobin: 9.8 g/dL — ABNORMAL LOW (ref 12.0–15.0)
Hemoglobin: 9.8 g/dL — ABNORMAL LOW (ref 12.0–15.0)
MCH: 30.5 pg (ref 26.0–34.0)
MCH: 30.7 pg (ref 26.0–34.0)
MCH: 30.2 pg (ref 26.0–34.0)
MCHC: 36.1 g/dL — ABNORMAL HIGH (ref 30.0–36.0)
MCHC: 36.3 g/dL — ABNORMAL HIGH (ref 30.0–36.0)
MCHC: 35.8 g/dL (ref 30.0–36.0)
MCV: 84.5 fL (ref 80.0–100.0)
MCV: 84.6 fL (ref 80.0–100.0)
MCV: 84.6 fL (ref 80.0–100.0)
Platelets: 40 10*3/uL — ABNORMAL LOW (ref 150–400)
Platelets: 30 10*3/uL — ABNORMAL LOW (ref 150–400)
Platelets: 30 10*3/uL — ABNORMAL LOW (ref 150–400)
RBC: 3.28 MIL/uL — ABNORMAL LOW (ref 3.87–5.11)
RBC: 3.19 MIL/uL — ABNORMAL LOW (ref 3.87–5.11)
RBC: 3.24 MIL/uL — ABNORMAL LOW (ref 3.87–5.11)
RDW: 13.6 % (ref 11.5–15.5)
RDW: 14.6 % (ref 11.5–15.5)
RDW: 15 % (ref 11.5–15.5)
WBC: 10.7 10*3/uL — ABNORMAL HIGH (ref 4.0–10.5)
WBC: 15.1 10*3/uL — ABNORMAL HIGH (ref 4.0–10.5)
WBC: 15.9 10*3/uL — ABNORMAL HIGH (ref 4.0–10.5)
nRBC: 15 % — ABNORMAL HIGH (ref 0.0–0.2)
nRBC: 16.7 % — ABNORMAL HIGH (ref 0.0–0.2)
nRBC: 13.5 % — ABNORMAL HIGH (ref 0.0–0.2)

## 2018-11-24 LAB — TRIGLYCERIDES: Triglycerides: 511 mg/dL — ABNORMAL HIGH (ref <150)

## 2018-11-24 LAB — BASIC METABOLIC PANEL
Anion gap: 13 (ref 5–15)
Anion gap: 15 (ref 5–15)
BUN: 20 mg/dL (ref 6–20)
BUN: 25 mg/dL — ABNORMAL HIGH (ref 6–20)
CO2: 26 mmol/L (ref 22–32)
CO2: 27 mmol/L (ref 22–32)
Calcium: 7.8 mg/dL — ABNORMAL LOW (ref 8.9–10.3)
Calcium: 8.2 mg/dL — ABNORMAL LOW (ref 8.9–10.3)
Chloride: 101 mmol/L (ref 98–111)
Chloride: 100 mmol/L (ref 98–111)
Creatinine, Ser: 2.74 mg/dL — ABNORMAL HIGH (ref 0.44–1.00)
Creatinine, Ser: 3.19 mg/dL — ABNORMAL HIGH (ref 0.44–1.00)
GFR calc Af Amer: 27 mL/min — ABNORMAL LOW (ref >60)
GFR calc Af Amer: 22 mL/min — ABNORMAL LOW (ref >60)
GFR calc non Af Amer: 23 mL/min — ABNORMAL LOW (ref >60)
GFR calc non Af Amer: 19 mL/min — ABNORMAL LOW (ref >60)
Glucose, Bld: 81 mg/dL (ref 70–99)
Glucose, Bld: 78 mg/dL (ref 70–99)
Potassium: 3.7 mmol/L (ref 3.5–5.1)
Potassium: 4.2 mmol/L (ref 3.5–5.1)
Sodium: 140 mmol/L (ref 135–145)
Sodium: 142 mmol/L (ref 135–145)

## 2018-11-24 LAB — BLOOD GAS, ARTERIAL
Acid-Base Excess: 3.3 mmol/L — ABNORMAL HIGH (ref 0.0–2.0)
Bicarbonate: 26.8 mmol/L (ref 20.0–28.0)
Drawn by: 34719
FIO2: 0.3
MECHVT: 400 mL
O2 Saturation: 95.7 %
PEEP: 5 cmH2O
Patient temperature: 99.7
RATE: 16 resp/min
pCO2 arterial: 38.3 mmHg (ref 32.0–48.0)
pH, Arterial: 7.462 — ABNORMAL HIGH (ref 7.350–7.450)
pO2, Arterial: 74.6 mmHg — ABNORMAL LOW (ref 83.0–108.0)

## 2018-11-24 LAB — BPAM CRYOPRECIPITATE
Blood Product Expiration Date: 202006031248
Blood Product Expiration Date: 202006031248
Blood Product Expiration Date: 202006031425
Blood Product Expiration Date: 202006031425
Blood Product Expiration Date: 202006031830
Blood Product Expiration Date: 202006031830
ISSUE DATE / TIME: 202006030738
ISSUE DATE / TIME: 202006030738
ISSUE DATE / TIME: 202006030852
ISSUE DATE / TIME: 202006030852
ISSUE DATE / TIME: 202006031258
ISSUE DATE / TIME: 202006031258
Unit Type and Rh: 5100
Unit Type and Rh: 5100
Unit Type and Rh: 5100
Unit Type and Rh: 5100
Unit Type and Rh: 5100
Unit Type and Rh: 5100

## 2018-11-24 LAB — PROTIME-INR
INR: 1.6 — ABNORMAL HIGH (ref 0.8–1.2)
Prothrombin Time: 19 seconds — ABNORMAL HIGH (ref 11.4–15.2)

## 2018-11-24 LAB — PLATELET COUNT: Platelets: 43 10*3/uL — ABNORMAL LOW (ref 150–400)

## 2018-11-24 LAB — PHOSPHORUS: Phosphorus: 6.6 mg/dL — ABNORMAL HIGH (ref 2.5–4.6)

## 2018-11-24 LAB — MAGNESIUM: Magnesium: 1.6 mg/dL — ABNORMAL LOW (ref 1.7–2.4)

## 2018-11-24 LAB — APTT: aPTT: 36 seconds (ref 24–36)

## 2018-11-24 MED ORDER — ONDANSETRON HCL 4 MG/2ML IJ SOLN
4.0000 mg | Freq: Four times a day (QID) | INTRAMUSCULAR | Status: DC | PRN
  Administered 2018-11-26 (×4): 4 mg via INTRAVENOUS
  Filled 2018-11-24 (×4): qty 2

## 2018-11-24 MED ORDER — DIPHENHYDRAMINE HCL 50 MG/ML IJ SOLN: 12.5000 mg | Freq: Four times a day (QID) | INTRAMUSCULAR | Status: DC | PRN

## 2018-11-24 MED ORDER — SODIUM CHLORIDE 0.9% IV SOLUTION
Freq: Once | INTRAVENOUS | Status: AC
  Administered 2018-11-24: 16:00:00 via INTRAVENOUS

## 2018-11-24 MED ORDER — CHLORHEXIDINE GLUCONATE CLOTH 2 % EX PADS
6.0000 | MEDICATED_PAD | Freq: Every day | CUTANEOUS | Status: DC
  Administered 2018-11-25: 6 via TOPICAL

## 2018-11-24 MED ORDER — PIPERACILLIN-TAZOBACTAM 3.375 G IVPB
3.3750 g | Freq: Three times a day (TID) | INTRAVENOUS | Status: DC
  Administered 2018-11-24: 3.375 g via INTRAVENOUS
  Filled 2018-11-24 (×2): qty 50

## 2018-11-24 MED ORDER — ORAL CARE MOUTH RINSE
15.0000 mL | Freq: Two times a day (BID) | OROMUCOSAL | Status: DC
  Administered 2018-11-24 – 2018-11-30 (×9): 15 mL via OROMUCOSAL

## 2018-11-24 MED ORDER — SODIUM CHLORIDE 0.9 % IV SOLN
3.0000 g | Freq: Three times a day (TID) | INTRAVENOUS | Status: DC
  Administered 2018-11-24 – 2018-11-25 (×3): 3 g via INTRAVENOUS
  Filled 2018-11-24 (×3): qty 3

## 2018-11-24 MED ORDER — SODIUM CHLORIDE 0.9% FLUSH: 9.0000 mL | INTRAVENOUS | Status: DC | PRN

## 2018-11-24 MED ORDER — NALOXONE HCL 0.4 MG/ML IJ SOLN: 0.4000 mg | INTRAMUSCULAR | Status: DC | PRN

## 2018-11-24 MED ORDER — FUROSEMIDE 10 MG/ML IJ SOLN
40.0000 mg | Freq: Three times a day (TID) | INTRAMUSCULAR | Status: AC
  Administered 2018-11-24 (×2): 40 mg via INTRAVENOUS
  Filled 2018-11-24 (×2): qty 4

## 2018-11-24 MED ORDER — SODIUM CHLORIDE 0.9 % IV SOLN
INTRAVENOUS | Status: DC | PRN
  Administered 2018-11-24: 250 mL via INTRAVENOUS

## 2018-11-24 MED ORDER — DIPHENHYDRAMINE HCL 12.5 MG/5ML PO ELIX
12.5000 mg | ORAL_SOLUTION | Freq: Four times a day (QID) | ORAL | Status: DC | PRN
  Administered 2018-11-25: 12.5 mg via ORAL
  Filled 2018-11-24 (×2): qty 5

## 2018-11-24 MED ORDER — HYDROMORPHONE 1 MG/ML IV SOLN
INTRAVENOUS | Status: DC
  Administered 2018-11-24: 0.9 mg via INTRAVENOUS
  Administered 2018-11-24: 2.6 mg via INTRAVENOUS
  Administered 2018-11-24: 30 mg via INTRAVENOUS
  Administered 2018-11-25: 2.7 mg via INTRAVENOUS
  Administered 2018-11-25: 1.2 mg via INTRAVENOUS
  Filled 2018-11-24 (×8): qty 30

## 2018-11-24 NOTE — Lactation Note (Signed)
This note was copied from a baby's chart. Lactation Consultation Note  Patient Name: Makayla Fletcher POEUM'P Date: 11/24/2018 Reason for consult: Initial assessment;1st time breastfeeding(Mother in ICU; DIC; blood loss)  1520 - 1550 - I visited [name] in ICU 3M05 to set up a DEBP. Mom was able to pump one breast at this time. I stayed throughout first pumping to observer and check mom's pain level. I used size 24 flanges (small nipples), but we discussed sizing up to 27 possibly. Her nipple expanded with pumping.  I noted colostrum beading up when mom did the initiate phase. Mom is not ambulatory. I showed mom and ICU RN how to clean pump equipment and how to dissemble and reassemble.  I recommended mom pump every three hours if she is physically able. Mom preferred to pump one breast at this time. She may opt to double pump as she is able (we discussed this).  I reviewed pumping expectations in days 1-3 and discussed how her maternal health may delay lactogenesis II. [name] verbalized understanding and she seemed pleased to be pumping.  Mom has a personal DEBP at home.  I suggested that we would follow up tomorrow. I reviewed directions with her current RN.  Maternal Data Does the patient have breastfeeding experience prior to this delivery?: No  Feeding Feeding Type: Bottle Fed - Formula Nipple Type: Slow - flow  LATCH Score                   Interventions Interventions: DEBP  Lactation Tools Discussed/Used Tools: Pump;Flanges Flange Size: 24;Other (comment)(may require a size 27) Breast pump type: Double-Electric Breast Pump WIC Program: No Pump Review: Setup, frequency, and cleaning Initiated by:: hl Date initiated:: 11/24/18   Consult Status Consult Status: Follow-up Date: 11/25/18 Follow-up type: In-patient    Walker Shadow 11/24/2018, 4:03 PM

## 2018-11-24 NOTE — Progress Notes (Signed)
Notified MD about patient HR sustained in the 140s.Patient alert and oriented. BP 134/86. Assessed pt midline dressing- intact with minimal drainage. Patient pain well-controlled, no change. Urine output adequate. Platelet count increased to 43. Will continue to monitor HR. No new orders.

## 2018-11-24 NOTE — Progress Notes (Signed)
Pt passed bedside swallow eval with flying colors. No cough or gurgling upon swallowing.

## 2018-11-24 NOTE — Progress Notes (Addendum)
Postoperative Note  Admission Date: 11/22/2018 Current Date: 11/24/2018 12:52 PM-->Late entry for 1145am due to Epic downtime  Makayla Fletcher is a 23 y.o. [id number] POD#01 EUA, supracervical hysterectomy for [facility] @ 39wks, admitted for hemoglobin [place].  Pregnancy complicated by: Patient Active Problem List   Diagnosis Date Noted  . AKI (acute kidney injury) (HCC) 11/23/2018  . Abnormal transaminases 11/23/2018  . Sickle cell disease (HCC) 11/22/2018  . Encounter for induction of labor 11/22/2018  . Maternal sickle cell anemia affecting pregnancy, antepartum (HCC) 05/11/2018  . Supervision of normal first pregnancy, antepartum 05/04/2018  . Vitamin D deficiency 06/06/2015  . Migraine without aura 12/18/2013  . 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 Sickle cell anemia (HCC) 06/17/2012  . Depression, major, single episode, moderate (HCC) 07/31/2011  . Generalized anxiety disorder 07/31/2011    Overnight/24hr events:  Patient received additional blood products overnight Labs, VS CT scan-->nothing seen that can embolize Recently extubated  Subjective:  Only endorses abdominal pain  Objective:    Current Vital Signs 24h Vital Sign Ranges  T 98.8 F (37.1 C) Temp  Avg: 100.2 F (37.9 C)  Min: 98.8 F (37.1 C)  Max: 101.7 F (38.7 C)  BP 126/85 BP  Min: 101/55  Max: 167/95  HR (!) 102 Pulse  Avg: 126.3  Min: 102  Max: 156  RR (!) 24 Resp  Avg: 21.9  Min: 0  Max: 35  SaO2 100 % Nasal Cannula SpO2  Avg: 99.6 %  Min: 96 %  Max: 100 %       24 Hour I/O Current Shift I/O  Time Ins Outs 06/03 0701 - 06/04 0700 In: 12141.9 [I.V.:4156.4] Out: 6761 [Urine:5375] 06/04 0701 - 06/04 1900 In: 373.8 [I.V.:350.3] Out: 1775 [Urine:1775]   Patient Vitals for the past 24 hrs:  BP Temp Temp src Pulse Resp SpO2 Weight  11/24/18 1200 126/85 - - (!) 102 (!) 24 100 % -  11/24/18 1131 - 98.8 F (37.1 C) Oral - - - -  11/24/18 1100 129/81 - - (!) 107 (!) 31 99 % -   11/24/18 1044 - - - (!) 117 - 99 % -  11/24/18 1000 132/88 - - (!) 109 (!) 26 99 % -  11/24/18 0900 135/84 - - (!) 107 17 100 % -  11/24/18 0800 139/88 - - (!) 114 (!) 33 99 % -  11/24/18 0737 (!) 143/98 - - (!) 110 (!) 22 99 % -  11/24/18 0733 - 99.5 F (37.5 C) Oral - - - -  11/24/18 0700 (!) 143/98 - - (!) 107 (!) 21 99 % -  11/24/18 0600 130/87 - - (!) 113 (!) 21 98 % -  11/24/18 0500 123/83 - - (!) 122 (!) 23 96 % -  11/24/18 0400 (!) 146/96 - - (!) 118 (!) 25 97 % 69.8 kg  11/24/18 0329 - (!) 101.4 F (38.6 C) Axillary - - - -  11/24/18 0313 139/78 - - (!) 110 (!) 25 100 % -  11/24/18 0200 (!) 144/100 - - (!) 118 (!) 25 98 % -  11/24/18 0100 (!) 146/98 - - (!) 124 (!) 28 97 % -  11/24/18 0000 (!) 138/101 - - (!) 125 (!) 25 97 % -  11/23/18 2331 - 99.7 F (37.6 C) Oral (!) 123 (!) 29 98 % -  11/23/18 2312 (!) 167/95 - - (!) 121 (!) 29 100 % -  11/23/18 2300 (!) 141/95 - - Marland Kitchen)  118 (!) 24 100 % -  11/23/18 2249 - 99.7 F (37.6 C) Oral (!) 128 (!) 26 100 % -  11/23/18 2227 - 99.7 F (37.6 C) Oral (!) 127 (!) 24 100 % -  11/23/18 2212 - 99.8 F (37.7 C) Oral (!) 140 (!) 35 100 % -  11/23/18 2200 131/85 - - (!) 131 (!) 26 100 % -  11/23/18 2159 - (!) 101.1 F (38.4 C) Oral (!) 131 (!) 28 100 % -  11/23/18 2137 - (!) 101.3 F (38.5 C) Oral (!) 127 (!) 23 100 % -  11/23/18 2121 - (!) 101.2 F (38.4 C) Oral (!) 139 (!) 25 100 % -  11/23/18 2107 - (!) 101.2 F (38.4 C) Oral (!) 153 (!) 26 100 % -  11/23/18 2100 123/74 - - (!) 147 18 100 % -  11/23/18 2052 - (!) 101.2 F (38.4 C) - (!) 145 (!) 24 100 % -  11/23/18 2050 - (!) 101.4 F (38.6 C) Oral (!) 142 (!) 26 100 % -  11/23/18 2035 - (!) 101.1 F (38.4 C) Oral (!) 129 (!) 22 100 % -  11/23/18 2000 120/71 - - (!) 149 16 98 % -  11/23/18 1926 (!) 101/55 (!) 101.7 F (38.7 C) Axillary (!) 156 19 100 % -  11/23/18 1900 121/70 - - (!) 150 (!) 21 100 % -  11/23/18 1845 - - - (!) 147 10 100 % -  11/23/18 1830 - - - (!) 140  (!) 21 100 % -  11/23/18 1815 - - - (!) 133 (!) 21 100 % -  11/23/18 1800 134/86 - - (!) 133 (!) 23 100 % -  11/23/18 1745 - - - (!) 129 20 100 % -  11/23/18 1730 - - - (!) 133 13 100 % -  11/23/18 1715 - - - (!) 135 (!) 24 100 % -  11/23/18 1700 137/88 - - (!) 132 19 100 % -  11/23/18 1645 - - - (!) 138 (!) 24 100 % -  11/23/18 1632 - 99.1 F (37.3 C) Oral (!) 129 (!) 0 100 % -  11/23/18 1630 - - - (!) 130 13 100 % -  11/23/18 1615 - - - (!) 121 (!) 25 100 % -  11/23/18 1601 133/89 - - (!) 127 (!) 21 100 % -  11/23/18 1600 - - - (!) 122 (!) 23 100 % -  11/23/18 1547 124/76 99.4 F (37.4 C) Oral (!) 121 20 100 % -  11/23/18 1546 - - - - - 100 % -  11/23/18 1545 - 99.4 F (37.4 C) Oral (!) 123 20 100 % -  11/23/18 1530 - - - (!) 123 (!) 21 100 % -  11/23/18 1515 - - - (!) 121 17 100 % -  11/23/18 1503 - 99.3 F (37.4 C) Oral (!) 116 (!) 22 100 % -  11/23/18 1500 (!) 135/97 - - (!) 113 (!) 22 100 % -  11/23/18 1445 - - - (!) 122 (!) 22 100 % -  11/23/18 1430 - - - (!) 122 (!) 22 100 % -  11/23/18 1426 - 98.9 F (37.2 C) Oral (!) 122 19 100 % -  11/23/18 1424 - - - (!) 125 (!) 23 100 % -  11/23/18 1415 - - - (!) 123 18 100 % -  11/23/18 1400 120/82 - - (!) 123 20 100 % -  11/23/18 1359 - - - Marland Kitchen)  123 19 100 % -  11/23/18 1345 - - - (!) 124 20 100 % -  11/23/18 1332 - - - (!) 123 18 100 % -  11/23/18 1330 - - - (!) 122 18 100 % -  11/23/18 1316 - - - (!) 121 19 100 % -  11/23/18 1315 - - - (!) 121 19 100 % -  11/23/18 1313 - 98.8 F (37.1 C) Oral (!) 123 19 100 % -  11/23/18 1300 (!) 112/51 - - (!) 140 (!) 23 100 % -   UOP in foley bag (clear)  Physical exam: General: NAD, watching TV Abdomen: more distended than yesterday, no BS, appropriately ttp. Dressing removed and saturated in old blood on bottom half. Vertical midline incision c/d/i with staples in place and no oozing or bleeding-->new dressing placed GU: no gross VB  Medications: Current Facility-Administered  Medications  Medication Dose Route Frequency Provider Last Rate Last Dose  . 0.9 %  sodium chloride infusion   Intravenous PRN Alyson Reedy, MD   Stopped at 11/24/18 [phone]  . Ampicillin-Sulbactam (UNASYN) 3 g in sodium chloride 0.9 % 100 mL IVPB  3 g Intravenous Q8H Alyson Reedy, MD      . coconut oil  1 application Topical PRN Levie Heritage, DO      . witch hazel-glycerin (TUCKS) pad 1 application  1 application Topical PRN Levie Heritage, DO       And  . dibucaine (NUPERCAINAL) 1 % rectal ointment 1 application  1 application Rectal PRN Levie Heritage, DO      . fentaNYL (SUBLIMAZE) injection 50 mcg  50 mcg Intravenous Q15 min PRN Alyson Reedy, MD   50 mcg at 11/24/18 1018  . fentaNYL (SUBLIMAZE) injection 50-100 mcg  50-100 mcg Intravenous Q2H PRN Lazaro Arms, MD   50 mcg at 11/24/18 1250  . furosemide (LASIX) injection 40 mg  40 mg Intravenous Q8H Alyson Reedy, MD   40 mg at 11/24/18 0934  . LORazepam (ATIVAN) injection 1 mg  1 mg Intravenous Q6H PRN Lazaro Arms, MD   1 mg at 11/23/18 2137  . MEDLINE mouth rinse  15 mL Mouth Rinse BID Alyson Reedy, MD      . propofol (DIPRIVAN) 1000 MG/100ML infusion  0-50 mcg/kg/min Intravenous Continuous Alyson Reedy, MD   Stopped at 11/24/18 0825    Labs:  Recent Labs  Lab 11/24/18 0125 11/24/18 0732 11/24/18 1000  WBC 10.7* 15.1* 15.9*  HGB 10.0* 9.8* 9.8*  HCT 27.7* 27.0* 27.4*  PLT 40* 30* 30*    Recent Labs  Lab 11/23/18 1027 11/23/18 1034 11/24/18 0733  NA 141 141 140  K 4.8 4.8 3.7  CL 110  --  101  CO2 20*  --  26  BUN 7  --  20  CREATININE 1.02*  --  2.74*  CALCIUM 7.1*  --  7.8*  PROT 3.4*  --   --   BILITOT 4.9*  --   --   ALKPHOS 48  --   --   ALT 349*  --   --   AST 663*  --   --   GLUCOSE 116*  --  81   6/4 @ 0730: Mg 1.6, PO4 6.6, triglycerides 511 Results for Makayla Fletcher, Makayla Fletcher (MRN [id number]) as of 11/24/2018 12:57  Ref. Range 11/23/2018 18:20 11/24/2018 01:25 11/24/2018 07:32  Fibrinogen  Latest Ref Range: 210 - 475 mg/dL 562  335 361    Radiology:   CLINICAL DATA:  Check endotracheal tube placement  EXAM: PORTABLE CHEST 1 VIEW  COMPARISON:  11/23/2018  FINDINGS: Cardiac shadow is stable. Endotracheal tube is noted in satisfactory position. Increasing right basilar infiltrate is noted laterally. Mild vascular congestion is noted as well.  IMPRESSION: Increasing right-sided infiltrate with vascular congestion.   Electronically Signed   By: Alcide Clever M.D.   On: 11/24/2018 07:50  CLINICAL DATA:  History of vaginal delivery complicated by postpartum hemorrhage ultimately requiring hysterectomy. Now with concern for DIC.  Please perform CTA of the abdomen pelvis to evaluate for potential area of percutaneous embolization.  EXAM: CTA ABDOMEN AND PELVIS WITH CONTRAST  TECHNIQUE: Multidetector CT imaging of the abdomen and pelvis was performed using the standard protocol during bolus administration of intravenous contrast. Multiplanar reconstructed images and MIPs were obtained and reviewed to evaluate the vascular anatomy.  CONTRAST:  OMNIPAQUE IOHEXOL 350 MG/ML SOLN  COMPARISON:  None.  FINDINGS: VASCULAR  Aorta: Normal caliber the abdominal aorta. The major branch vessels of the abdominal aorta appear widely patent.  Celiac: Widely patent without hemodynamically significant narrowing. Conventional branching pattern.  SMA: Widely patent without hemodynamically significant narrowing. Conventional branching pattern. The distal tributaries the SMA are widely patent without discrete internal filling defect suggest distal embolism.  Renals: Solitary bilaterally. Widely patent without hemodynamically significant narrowing. No vessel irregularity to suggest FMD.  IMA: Widely patent without hemodynamically significant narrowing.  Inflow: The bilateral common, external and internal iliac arteries are of normal caliber and  widely patent without hemodynamically significant narrowing.  There is AV shunting through both adnexa with early filling of the bilateral gonadal veins compatible with patient's recent postpartum state.  While there is a large amount fluid and evolving blood products throughout the abdomen pelvis, there are no discrete large areas of active arterial extravasation identified within the abdomen or pelvis though note is made of a small amount of layering contrast extravasation with associated hematocrit level within the left lower pelvis (image 123, series 10; image 74, series 6) though again, a discrete feeding arterial vessel is not identified and thus may be venous in etiology.  Proximal Outflow: The normal caliber bilateral common and imaged portions of the bilateral superficial and deep femoral arteries are widely patent without a hemodynamically significant stenosis.  Veins: The IVC and pelvic venous system appear widely patent.  As above, there is AV shunting through the bilateral adnexa with early filling of the bilateral gonadal veins.  Review of the MIP images confirms the above findings.  _________________________________________________________  NON-VASCULAR  Lower chest: Limited visualization of the lower thorax demonstrates subsegmental atelectasis within the imaged bilateral lung bases. No definite pleural effusion.  Borderline cardiomegaly.  No pericardial effusion.  Hepatobiliary: Normal hepatic contour. Mottled appearance of the subcapsular aspect of the liver, most conspicuous about the posterior segment of the right lobe of the liver (image 16, series 2). Note is made of an approximately 2.0 cm hypoattenuating nonenhancing hepatic cyst. Normal appearance of the gallbladder given degree of distention. No radiopaque gallstones. No intra or extrahepatic biliary ductal dilatation.  Pancreas: Normal appearance of the pancreas.  Spleen: The  spleen is enlarged measuring approximately 17.2 cm in length. Additionally, there is a mottled appearance involving the subcapsular aspect of the spleen (image 24, series 6, similar to the liver.  Adrenals/Urinary Tract: There is symmetric enhancement of the bilateral kidneys. No definite renal stones this postcontrast examination. No discrete renal lesions. No  urinary obstruction or perinephric stranding.  Normal appearance of the bilateral adrenal glands. The urinary bladder is decompressed with a Foley catheter.  Stomach/Bowel: Bowel is normal in course and caliber without wall thickening mild distension of several loops of small bowel as could be seen with ileus. No discrete transition site is identified. Scattered foci of pneumoperitoneum compatible with recent operative intervention. No pneumatosis or portal venous gas.  Lymphatic: No bulky retroperitoneal, mesenteric, pelvic or inguinal lymphadenopathy.  Reproductive: Post hysterectomy though there is a large amount of fluid involving blood products within the lower pelvis. Heterogeneous enhancement of the bilateral adnexa with early filling of the bilateral gonadal veins, likely the sequela of patient's immediate postpartum state.  Other: There is a large amount of fluid evolving blood products throughout the abdomen and pelvis with several hematocrit levels identified, most conspicuous within the bilateral pericolic gutters (left-image 53, right-image 44) and lower pelvis (image 76, series 5).  Postoperative change of the ventral aspect of lower abdomen/pelvis, the sequela of interval hysterectomy. Subcutaneous emphysema is noted about the left pelvic sidewall, pericolic gutter and rectus musculature, left greater than right.  Musculoskeletal: No acute or aggressive osseous abnormalities. Note is made of a epidural catheter.  IMPRESSION: VASCULAR  1. Large amount of fluid and evolving blood products  throughout the abdomen and pelvis with multiple hematocrit levels seen within the bilateral pericolic gutters, lower abdomen and pelvis. While there is a small amount of extravasation noted within a loculated hematocrit level within the left lower abdomen/pelvis, a definitive arterial feeder is not identified and thus this area of bleeding could be venous in etiology. 2. AV shunting via the bilateral adnexa with early filling of the bilateral gonadal veins, the sequela of patient's recent postpartum state.  NON-VASCULAR  1. Mottled appearance the subcapsular aspects of the liver (most conspicuously involving the posterior segment of the right lobe of the liver) and spleen. No definitive wedge-shaped defects to suggest infarction. While nonspecific, findings could be seen in the setting hypovolemic shock and/or the provided history of DIC. 2. Expected postoperative change of the ventral aspect of the lower abdominal/pelvic wall with associated scattered foci of subcutaneous emphysema. 3. Splenomegaly, nonspecific though potentially attributable provided history sickle cell anemia. 4. Suspected developing ileus.  No evidence of enteric obstruction.  - Above findings were discussed with on-call OBGYN at the time of examination completion on 11/23/2018. Following this discussion, it was decided not to pursue angiography and potential intervention at this time given lack of definitive arterial extravasation and patient's history of DIC.   Electronically Signed   By: Simonne ComeJohn  Watts M.D.   On: 11/24/2018 08:51  Assessment & Plan:  Patient improved  Appreciate excellent ICU care Continue trending labs and repeat today at 1400 Continue NPO. Concern for Ileus. Will ask ICU if okay for fentanyl PCA Continue Unasyn. Concern for pelvic abscess from hemoperitoneum Will ask lactation to see patient  Dr. Jolayne Pantheronstant updated. Please call [phone] with any issues.   Makayla Fletcher, Jr.  MD Attending [facility](Faculty Practice)

## 2018-11-24 NOTE — Progress Notes (Signed)
2330: Blood products completed (4PRBCs, 4 FFP, 2 PLT).   0205: Dr Despina Hidden called and informed of pt current lab values. Ordered to repeat CBC and Fibrinogen at 0730. Also made aware of pt BP elevated on a-line but cuff pressure better. Will go off cuff pressure for now, pt HR improved as well. Will continue to monitor for signs of bleeding.

## 2018-11-24 NOTE — Progress Notes (Signed)
MOB was referred for history of depression/anxiety. * Referral screened out by Clinical Social Worker because none of the following criteria appear to apply: ~ History of anxiety/depression during this pregnancy, or of post-partum depression following prior delivery. ~ Diagnosis of anxiety and/or depression within last 3 years OR * MOB's symptoms currently being treated with medication and/or therapy.    Please contact the Clinical Social Worker if needs arise, by MOB request, or if MOB scores greater than 9/yes to question 10 on [place] Postpartum Depression Screen.      Desmin Daleo S. Lathan Gieselman, MSW, LCSW-A [facility] at  [phone]  

## 2018-11-24 NOTE — Progress Notes (Signed)
Follow up - OB CriticalCare  Note  Patient Details:    Makayla Fletcher is an 23 y.o. female. Intubated s/p SVD 6/3@0249  with uterine inversion and subsequent Abdominal hysterectomy with massive blood loss associated DIC with on going consumptive coagulopathy  Lines, Airways, Drains: Airway 7 mm (Active)  Secured at (cm) 23 cm 11/24/2018  4:00 AM  Measured From Lips 11/24/2018  4:00 AM  Secured Location Right 11/24/2018  4:00 AM  Secured By [employer] 11/24/2018  4:00 AM  Tube Holder Repositioned Yes 11/24/2018  3:13 AM  Site Condition Dry 11/24/2018  4:00 AM     Arterial Line 11/23/18 Left Radial (Active)  Site Assessment Clean;Dry;Intact 11/23/2018  8:00 PM  Line Status Pulsatile blood flow 11/23/2018  8:00 PM  Art Line Waveform Appropriate;Square wave test performed 11/23/2018  8:00 PM  Art Line Interventions Leveled;Connections checked and tightened 11/23/2018  8:00 PM  Color/Movement/Sensation Capillary refill less than 3 sec 11/23/2018  8:00 PM  Dressing Type Transparent;Occlusive 11/23/2018  8:00 PM  Dressing Status Clean;Dry;Intact 11/23/2018  8:00 PM  Interventions Other (Comment) 11/23/2018  8:00 PM  Dressing Change Due 11/29/18 11/23/2018  8:00 PM     Epidural Catheter 11/22/18 (Active)  Site Assessment Clean;Dry;Intact 11/23/2018  8:00 PM  Line Status Capped 11/23/2018  8:00 PM  Interventions Other (Comment) 11/23/2018  8:00 PM  Dressing Change Due 11/29/18 11/23/2018  8:00 PM     Urethral Catheter Ashley Mariner RNC  Non-latex 14 Fr. (Active)  Indication for Insertion or Continuance of Catheter Peri-operative use for selective surgical procedure - not to exceed 24 hours post-op 11/23/2018  8:00 PM  Site Assessment Clean;Intact 11/23/2018  8:00 PM  Catheter Maintenance Bag below level of bladder;Catheter secured;Drainage bag/tubing not touching floor;Insertion date on drainage bag;No dependent loops;Seal intact 11/23/2018  8:00 PM  Collection Container Standard drainage bag 11/23/2018  8:00 PM   Securement Method Leg strap 11/23/2018  8:00 PM  Urinary Catheter Interventions Unclamped 11/23/2018  8:00 PM  Output (mL) 350 mL 11/24/2018  6:00 AM    Anti-infectives:  Anti-infectives (From admission, onward)   Start     Dose/Rate Route Frequency Ordered Stop   11/23/18 0630  azithromycin (ZITHROMAX) 500 mg in sodium chloride 0.9 % 250 mL IVPB  Status:  Discontinued     500 mg 250 mL/hr over 60 Minutes Intravenous  Once 11/23/18 0620 11/23/18 0949   11/23/18 0345  ceFAZolin (ANCEF) IVPB 2g/100 mL premix  Status:  Discontinued     2 g 200 mL/hr over 30 Minutes Intravenous  Once 11/23/18 0343 11/23/18 1914      Microbiology: Results for orders placed or performed during the hospital encounter of 11/22/18  MRSA PCR Screening     Status: None   Collection Time: 11/23/18 10:56 AM  Result Value Ref Range Status   MRSA by PCR NEGATIVE NEGATIVE Final    Comment:        The GeneXpert MRSA Assay (FDA approved for NASAL specimens only), is one component of a comprehensive MRSA colonization surveillance program. It is not intended to diagnose MRSA infection nor to guide or monitor treatment for MRSA infections. Performed at [facility], [address] [place], [place] [address]     Best Practice/Protocols:  VTE Prophylaxis: Mechanical   Events:   Studies: Dg Chest Port 1 View  Result Date: 11/23/2018 CLINICAL DATA:  Check endotracheal tube placement EXAM: PORTABLE CHEST 1 VIEW COMPARISON:  05/26/2015 FINDINGS: Cardiac shadows within normal limits  but accentuated by the portable technique. Endotracheal tube is noted in satisfactory position. Esophageal probe is seen within the mid esophagus. Patchy infiltrative changes are noted in the right base. No pneumothorax is noted. IMPRESSION: Patchy right basilar infiltrate laterally. Endotracheal tube in satisfactory position. Electronically Signed   By: Alcide CleverMark  Lukens M.D.   On: 11/23/2018 10:51    CT Angio final read pending but  talked with IR attending who stated no discrete area of bleeding identified, blood in the abdomen consistent with DIC post op clinical picture, did not feel there was anything they could add to current clinical care  Results for orders placed or performed during the hospital encounter of 11/22/18 (from the past 24 hour(s))  Prepare cryoprecipitate     Status: None (Preliminary result)   Collection Time: 11/23/18  6:48 AM  Result Value Ref Range   Unit Number [id number]    Blood Component Type CRYPOOL THAW    Unit division 00    Status of Unit ISSUED    Transfusion Status OK TO TRANSFUSE    Unit Number [id number]    Blood Component Type CRYPOOL THAW    Unit division 00    Status of Unit ISSUED    Transfusion Status      OK TO TRANSFUSE Performed at [facility], [address] [place], [place] [address]   Prepare Pheresed Platelets     Status: None (Preliminary result)   Collection Time: 11/23/18  7:33 AM  Result Value Ref Range   Unit Number [id number]    Blood Component Type PLTPH LI2 PAS    Unit division 00    Status of Unit ISSUED    Transfusion Status OK TO TRANSFUSE    Unit Number [id number]    Blood Component Type PLTPH LI2 PAS    Unit division 00    Status of Unit ISSUED    Transfusion Status      OK TO TRANSFUSE Performed at [facility], [address] [place], [place] [address]   Prepare cryoprecipitate     Status: None (Preliminary result)   Collection Time: 11/23/18  8:29 AM  Result Value Ref Range   Unit Number [id number]    Blood Component Type CRYPOOL THAW    Unit division 00    Status of Unit ISSUED    Transfusion Status OK TO TRANSFUSE    Unit Number [id number]    Blood Component Type CRYPOOL THAW    Unit division 00    Status of Unit ISSUED    Transfusion Status      OK TO TRANSFUSE Performed at [facility], [address] [place], [place] [address]   I-STAT 7, (LYTES, BLD GAS, ICA, H+H)     Status: Abnormal    Collection Time: 11/23/18  8:29 AM  Result Value Ref Range   pH, Arterial 7.292 (L) 7.350 - 7.450   pCO2 arterial 42.1 32.0 - 48.0 mmHg   pO2, Arterial 270.0 (H) 83.0 - 108.0 mmHg   Bicarbonate 20.4 20.0 - 28.0 mmol/L   TCO2 22 22 - 32 mmol/L   O2 Saturation 100.0 %   Acid-base deficit 6.0 (H) 0.0 - 2.0 mmol/L   Sodium 141 135 - 145 mmol/L   Potassium 4.3 3.5 - 5.1 mmol/L   Calcium, Ion 0.94 (L) 1.15 - 1.40 mmol/L   HCT 22.0 (L) 36.0 - 46.0 %   Hemoglobin 7.5 (L) 12.0 - 15.0 g/dL   Patient temperature HIDE  Sample type ARTERIAL   Glucose, capillary     Status: Abnormal   Collection Time: 11/23/18  9:29 AM  Result Value Ref Range   Glucose-Capillary 133 (H) 70 - 99 mg/dL  CBC     Status: Abnormal   Collection Time: 11/23/18 10:27 AM  Result Value Ref Range   WBC 10.0 4.0 - 10.5 K/uL   RBC 2.39 (L) 3.87 - 5.11 MIL/uL   Hemoglobin 7.3 (L) 12.0 - 15.0 g/dL   HCT 11.9 (L) 14.7 - 82.9 %   MCV 88.7 80.0 - 100.0 fL   MCH 30.5 26.0 - 34.0 pg   MCHC 34.4 30.0 - 36.0 g/dL   RDW 56.2 13.0 - 86.5 %   Platelets 11 (LL) 150 - 400 K/uL   nRBC 2.2 (H) 0.0 - 0.2 %  Comprehensive metabolic panel     Status: Abnormal   Collection Time: 11/23/18 10:27 AM  Result Value Ref Range   Sodium 141 135 - 145 mmol/L   Potassium 4.8 3.5 - 5.1 mmol/L   Chloride 110 98 - 111 mmol/L   CO2 20 (L) 22 - 32 mmol/L   Glucose, Bld 116 (H) 70 - 99 mg/dL   BUN 7 6 - 20 mg/dL   Creatinine, Ser 7.84 (H) 0.44 - 1.00 mg/dL   Calcium 7.1 (L) 8.9 - 10.3 mg/dL   Total Protein 3.4 (L) 6.5 - 8.1 g/dL   Albumin 1.8 (L) 3.5 - 5.0 g/dL   AST 696 (H) 15 - 41 U/L   ALT 349 (H) 0 - 44 U/L   Alkaline Phosphatase 48 38 - 126 U/L   Total Bilirubin 4.9 (H) 0.3 - 1.2 mg/dL   GFR calc non Af Amer >60 >60 mL/min   GFR calc Af Amer >60 >60 mL/min   Anion gap 11 5 - 15  Fibrinogen     Status: None   Collection Time: 11/23/18 10:27 AM  Result Value Ref Range   Fibrinogen 224 210 - 475 mg/dL  Protime-INR     Status:  Abnormal   Collection Time: 11/23/18 10:27 AM  Result Value Ref Range   Prothrombin Time 36.1 (H) 11.4 - 15.2 seconds   INR 3.7 (H) 0.8 - 1.2  APTT     Status: Abnormal   Collection Time: 11/23/18 10:27 AM  Result Value Ref Range   aPTT 91 (H) 24 - 36 seconds  I-STAT 7, (LYTES, BLD GAS, ICA, H+H)     Status: Abnormal   Collection Time: 11/23/18 10:34 AM  Result Value Ref Range   pH, Arterial 7.327 (L) 7.350 - 7.450   pCO2 arterial 40.6 32.0 - 48.0 mmHg   pO2, Arterial 133.0 (H) 83.0 - 108.0 mmHg   Bicarbonate 21.4 20.0 - 28.0 mmol/L   TCO2 23 22 - 32 mmol/L   O2 Saturation 99.0 %   Acid-base deficit 4.0 (H) 0.0 - 2.0 mmol/L   Sodium 141 135 - 145 mmol/L   Potassium 4.8 3.5 - 5.1 mmol/L   Calcium, Ion 1.03 (L) 1.15 - 1.40 mmol/L   HCT 17.0 (L) 36.0 - 46.0 %   Hemoglobin 5.8 (LL) 12.0 - 15.0 g/dL   Patient temperature 29.5 F    Collection site RADIAL, ALLEN'S TEST ACCEPTABLE    Drawn by VP    Sample type ARTERIAL    Comment NOTIFIED PHYSICIAN   MRSA PCR Screening     Status: None   Collection Time: 11/23/18 10:56 AM  Result Value Ref Range   MRSA  by PCR NEGATIVE NEGATIVE  Glucose, capillary     Status: None   Collection Time: 11/23/18 11:24 AM  Result Value Ref Range   Glucose-Capillary 93 70 - 99 mg/dL  Prepare Pheresed Platelets     Status: None (Preliminary result)   Collection Time: 11/23/18 11:47 AM  Result Value Ref Range   Unit Number [id number]    Blood Component Type PLTP LI1 PAS    Unit division 00    Status of Unit ISSUED    Transfusion Status      OK TO TRANSFUSE Performed at [facility], [address] [place], [place] [address]   Prepare RBC     Status: None   Collection Time: 11/23/18 12:24 PM  Result Value Ref Range   Order Confirmation      ORDER PROCESSED BY BLOOD BANK Performed at [facility], [address] [place], [place] [address]   Prepare Pheresed Platelets     Status: None (Preliminary result)   Collection Time:  11/23/18 12:25 PM  Result Value Ref Range   Unit Number [id number]    Blood Component Type PLTP LR1 PAS    Unit division 00    Status of Unit ISSUED    Transfusion Status      OK TO TRANSFUSE Performed at [facility], [address] [place], [place] [address]   Prepare cryoprecipitate     Status: None (Preliminary result)   Collection Time: 11/23/18 12:26 PM  Result Value Ref Range   Unit Number [id number]    Blood Component Type CRYPOOL THAW    Unit division 00    Status of Unit ISSUED    Transfusion Status OK TO TRANSFUSE    Unit Number [id number]    Blood Component Type CRYPOOL THAW    Unit division 00    Status of Unit ISSUED    Transfusion Status OK TO TRANSFUSE   Glucose, capillary     Status: None   Collection Time: 11/23/18  3:31 PM  Result Value Ref Range   Glucose-Capillary 71 70 - 99 mg/dL  CBC     Status: Abnormal   Collection Time: 11/23/18  6:20 PM  Result Value Ref Range   WBC 7.7 4.0 - 10.5 K/uL   RBC 1.71 (L) 3.87 - 5.11 MIL/uL   Hemoglobin 5.2 (LL) 12.0 - 15.0 g/dL   HCT 87.5 (L) 64.3 - 32.9 %   MCV 84.8 80.0 - 100.0 fL   MCH 30.4 26.0 - 34.0 pg   MCHC 35.9 30.0 - 36.0 g/dL   RDW 51.8 84.1 - 66.0 %   Platelets 21 (LL) 150 - 400 K/uL   nRBC 9.0 (H) 0.0 - 0.2 %  DIC (disseminated intravasc coag) panel     Status: Abnormal   Collection Time: 11/23/18  6:20 PM  Result Value Ref Range   Prothrombin Time 22.5 (H) 11.4 - 15.2 seconds   INR 2.0 (H) 0.8 - 1.2   aPTT 48 (H) 24 - 36 seconds   Fibrinogen 302 210 - 475 mg/dL   D-Dimer, Quant >63.01 (H) 0.00 - 0.50 ug/mL-FEU   Platelets 20 (LL) 150 - 400 K/uL   Smear Review FEW SCHISTOCYTES NOTED   Prepare RBC     Status: None   Collection Time: 11/23/18  7:18 PM  Result Value Ref Range   Order Confirmation      ORDER PROCESSED BY BLOOD BANK Performed at [facility], [address], [place], [place] [address]   Prepare fresh frozen plasma     Status: None (Preliminary result)    Collection Time: 11/23/18  7:19 PM  Result Value Ref Range   Unit Number [id number]    Blood Component Type THAWED PLASMA    Unit division 00    Status of Unit ISSUED    Transfusion Status OK TO TRANSFUSE    Unit Number [id number]    Blood Component Type THAWED PLASMA    Unit division 00    Status of Unit ISSUED    Transfusion Status OK TO TRANSFUSE    Unit Number [id number]    Blood Component Type THAWED PLASMA    Unit division 00    Status of Unit ISSUED    Transfusion Status      OK TO TRANSFUSE Performed at [facility], [address] [place], [place] [address]   Prepare Pheresed Platelets     Status: None (Preliminary result)   Collection Time: 11/23/18  7:21 PM  Result Value Ref Range   Unit Number [id number]    Blood Component Type PLTP LR3 PAS    Unit division 00    Status of Unit ISSUED    Transfusion Status      OK TO TRANSFUSE Performed at [facility], [address] [place], [place] [address]   Glucose, capillary     Status: Abnormal   Collection Time: 11/23/18  7:23 PM  Result Value Ref Range   Glucose-Capillary 59 (L) 70 - 99 mg/dL  Glucose, capillary     Status: Abnormal   Collection Time: 11/23/18  7:59 PM  Result Value Ref Range   Glucose-Capillary 138 (H) 70 - 99 mg/dL  Glucose, capillary     Status: None   Collection Time: 11/23/18 11:24 PM  Result Value Ref Range   Glucose-Capillary 79 70 - 99 mg/dL  CBC     Status: Abnormal   Collection Time: 11/24/18  1:25 AM  Result Value Ref Range   WBC 10.7 (H) 4.0 - 10.5 K/uL   RBC 3.28 (L) 3.87 - 5.11 MIL/uL   Hemoglobin 10.0 (L) 12.0 - 15.0 g/dL   HCT 78.4 (L) 12.8 - 20.8 %   MCV 84.5 80.0 - 100.0 fL   MCH 30.5 26.0 - 34.0 pg   MCHC 36.1 (H) 30.0 - 36.0 g/dL   RDW 13.8 87.1 - 95.9 %   Platelets 40 (L) 150 - 400 K/uL   nRBC 15.0 (H) 0.0 - 0.2 %  Fibrinogen     Status: None   Collection Time: 11/24/18  1:25 AM  Result Value Ref Range   Fibrinogen 335 210 - 475 mg/dL   Blood gas, arterial     Status: Abnormal   Collection Time: 11/24/18  3:10 AM  Result Value Ref Range   FIO2 0.30    Delivery systems VENTILATOR    Mode PRESSURE REGULATED VOLUME CONTROL    VT 400 mL   LHR 16 resp/min   Peep/cpap 5.0 cm H20   pH, Arterial 7.462 (H) 7.350 - 7.450   pCO2 arterial 38.3 32.0 - 48.0 mmHg   pO2, Arterial 74.6 (L) 83.0 - 108.0 mmHg   Bicarbonate 26.8 20.0 - 28.0 mmol/L   Acid-Base Excess 3.3 (H) 0.0 - 2.0 mmol/L   O2 Saturation 95.7 %   Patient temperature 99.7    Collection site A-LINE    Drawn by [id number]    Sample type ARTERIAL DRAW   Glucose,  capillary     Status: None   Collection Time: 11/24/18  3:26 AM  Result Value Ref Range   Glucose-Capillary 87 70 - 99 mg/dL    Consults: CCM involved with ventilator and overall critical care issues    Subjective:    Overnight Issues:  BP, Pulse have stabilized and her labs are much improved after her transfusions of 4 U PRBC, 4U FFP, 2U platelets  Objective:  Vital signs for last 24 hours: Temp:  [98.3 F (36.8 C)-101.7 F (38.7 C)] 101.4 F (38.6 C) (06/04 0329) Pulse Rate:  [101-156] 113 (06/04 0600) Resp:  [0-35] 21 (06/04 0600) BP: (85-167)/(51-101) 130/87 (06/04 0600) SpO2:  [96 %-100 %] 98 % (06/04 0600) Arterial Line BP: (76-169)/(59-93) 128/79 (06/04 0600) FiO2 (%):  [30 %-50 %] 30 % (06/04 0400) Weight:  [68.2 kg-69.8 kg] 69.8 kg (06/04 0400)  Hemodynamic parameters for last 24 hours:    Intake/Output from previous day: 06/03 0701 - 06/04 0700 In: 16109.6 [I.V.:4010.8; Blood:7950.8; IV Piggyback:34.7] Out: 6761 [Urine:5375; Blood:1386]  Intake/Output this shift: Total I/O In: 4138.9 [I.V.:1166.2; Blood:2972.7] Out: 3825 [Urine:3825]  Vent settings for last 24 hours: Vent Mode: PRVC FiO2 (%):  [30 %-50 %] 30 % Set Rate:  [16 bmp] 16 bmp Vt Set:  [400 mL] 400 mL PEEP:  [5 cmH20] 5 cmH20 Plateau Pressure:  [10 cmH20-22 cmH20] 20 cmH20  Physical Exam:  General: intubated  responsive to verbal stimuli Resp: intubated GI: abdomen distended but stable from intraperitoneal hemorrhage which ihas helped to stabilize her coagulopathy Extremities: edema 1+  Assessment/Plan:   NEURO  responsive to verbal stimuli   Plan:   PULM  Stable vent settings, managed by CCM   Plan:   CARDIO  stable   Plan: improved now that DIC picture seems to be improving  RENAL  Excellent UOP   Plan: continue to support with fluid blood products as needed  GI  Intraperitoneal hemorrhage seems stable, with fever will cover with zosyn to manage probable pelvic hematoma ---->abscess evolution that is liekly to come   Plan: zosyn  ID  See above,    Plan: zosyn  HEME  Stabilized DIC picture   Plan: support as needed  ENDO    Plan:   Global Issues  Pettis disease:  Support as needed with blood products, currently Hgb 10    LOS: 2 days   Additional comments:I reviewed the patient's new clinical lab test results. labs and I reviewed the patients new imaging test results. scans    Lazaro Arms 11/24/2018  Patient ID: [name], female   DOB: [birth year], 23 y.o.   MRN: [id number]

## 2018-11-24 NOTE — Progress Notes (Signed)
NAME:  Makayla Fletcher, MRN:  [id number], DOB:  Mar 21, 1996, LOS: 2 ADMISSION DATE:  11/22/2018, CONSULTATION DATE:  11/23/18 REFERRING MD:  Adrian BlackwaterStinson - OBGYN  CHIEF COMPLAINT:  Post-partum hemorrhagic shock   Brief History    23 yo F who on 6/3 underwent Vaginal delivery complicated by uterine inversion, retained placenta, post-partum hemorrhage with hemorrhagic shock, emergent hysterectomy. Underwent MTP receiving 5.4L crystalloid, 4 Cryo, 2 plt, 8 PRBC, 5 FFP. Intubated, transferring to MICU  History of present illness    23 yo F PMH depression, sickle cell anemia, migraine who on 6/3 underwent spontaneous vaginal delivery complicated by uterine inversion, retained placenta, post-partum hemorrhage with hemorrhagic shock, emergent hysterectomy. Underwent MTP receiving 5.4L crystalloid, 4 Cryo, 2 plt, 8 PRBC, 5 FFP. Remains intubated.   Past Medical History  Sickle cell anemia Depression Migraine  Significant Hospital Events    6/3 Vaginal delivery complicated by uterine inversion, retained placenta, post-partum hemorrhage with hemorrhagic shock, emergent hysterectomy. Remains intubated and being transferred to ICU   Consults:  OBGYN PCCM  Procedures:  6/3 hysterectomy 2/2 post-partum hemorrhagic shock 6/3 ETT >>>   Significant Diagnostic Tests:   Micro Data:   Antimicrobials:  Azithromycin 6/3>> Ancef 6/3   Interim history/subjective:   No events overnight, tolerating weaning this AM  Objective   Blood pressure (!) 143/98, pulse (!) 110, temperature 99.5 F (37.5 C), temperature source Oral, resp. rate (!) 22, height 5\' 2"  (1.575 m), weight 69.8 kg, last menstrual period 02/21/2018, SpO2 99 %, unknown if currently breastfeeding.    Vent Mode: CPAP;PSV FiO2 (%):  [30 %-50 %] 30 % Set Rate:  [16 bmp] 16 bmp Vt Set:  [400 mL] 400 mL PEEP:  [5 cmH20] 5 cmH20 Pressure Support:  [5 cmH20] 5 cmH20 Plateau Pressure:  [10 cmH20-22 cmH20] 20 cmH20   Intake/Output Summary  (Last 24 hours) at 11/24/2018 0837 Last data filed at 11/24/2018 0750 Gross per 24 hour  Intake 8292.9 ml  Output 5665 ml  Net 2627.9 ml   Filed Weights   11/23/18 1000 11/24/18 0400  Weight: 68.2 kg 69.8 kg    Examination: General: Acutely ill appearing female, NAD HENT: [place]/AT, PERRL, EOM-I Lungs: CTA bilaterally. Symmetrical chest expansion. No accessory muscle recruitment, synchronous with vent  Cardiovascular: RRR s1s2 no rgm. 1+ radial pulses  Abdomen: Soft, distended. Abdominal incision dressing c/d/i without evidence of bleeding  Extremities: Symmetrical bulk and tone.  Neuro: Sedate but easily arousable, moving all ext to command Skin: Incision appears clean  I reviewed CXR myself, ETT is in a good position but pulmonary edema noted with RLL infiltrate  Resolved Hospital Problem list    Assessment & Plan:   Post-partum hemorrhagic shock s/p ex lap with hysterectomy S/p MTP with 7 PRNB 4 Cryo 2 Plt 5 FFP  P: MAP goal > 65. Currently HDS  Trend CBCs q6 Transfuse per unit protocol  Post-partum care per OBGYN KVO IVF Lasix 40 mg IV q8 x2 doses  Acute respiratory failure requiring intubation -airway protection for general anesthesia for ex lap, hysterectomy 2/2 hemorrhagic shock -remains intubated  -FiO2 decreased to 30% P Wean to extubate today Titrate FiO2 PEEP for SpO2 goal > 94 D/C propofol Pulm hygiene  AM CXR  D/C zosyn Unasyn Watch for evidence of TRALI  Best practice:  Diet: NPO Pain/Anxiety/Delirium protocol (if indicated): Prop, PRN fent  VAP protocol (if indicated): yes DVT prophylaxis: SCD  GI prophylaxis: protonix  Glucose control: monitor Mobility: bedrest Code Status: Full  Family Communication: per OB Disposition: ICU   Labs   CBC: Recent Labs  Lab 11/23/18 0640  11/23/18 1027 11/23/18 1034 11/23/18 1820 11/24/18 0125 11/24/18 0732  WBC 21.3*  --  10.0  --  7.7 10.7* 15.1*  HGB 8.0*   < > 7.3* 5.8* 5.2* 10.0* 9.8*  HCT 24.1*    < > 21.2* 17.0* 14.5* 27.7* 27.0*  MCV 89.9  --  88.7  --  84.8 84.5 84.6  PLT 66*  68*  --  11*  --  20*  21* 40* 30*   < > = values in this interval not displayed.    Basic Metabolic Panel: Recent Labs  Lab 11/23/18 0643 11/23/18 0829 11/23/18 1027 11/23/18 1034  NA 138 141 141 141  K 4.6 4.3 4.8 4.8  CL  --   --  110  --   CO2  --   --  20*  --   GLUCOSE  --   --  116*  --   BUN  --   --  7  --   CREATININE  --   --  1.02*  --   CALCIUM  --   --  7.1*  --    GFR: Estimated Creatinine Clearance: 77.9 mL/min (A) (by C-G formula based on SCr of 1.02 mg/dL (H)). Recent Labs  Lab 11/23/18 1027 11/23/18 1820 11/24/18 0125 11/24/18 0732  WBC 10.0 7.7 10.7* 15.1*    Liver Function Tests: Recent Labs  Lab 11/23/18 1027  AST 663*  ALT 349*  ALKPHOS 48  BILITOT 4.9*  PROT 3.4*  ALBUMIN 1.8*   No results for input(s): LIPASE, AMYLASE in the last 168 hours. No results for input(s): AMMONIA in the last 168 hours.  ABG    Component Value Date/Time   PHART 7.462 (H) 11/24/2018 0310   PCO2ART 38.3 11/24/2018 0310   PO2ART 74.6 (L) 11/24/2018 0310   HCO3 26.8 11/24/2018 0310   TCO2 23 11/23/2018 1034   ACIDBASEDEF 4.0 (H) 11/23/2018 1034   O2SAT 95.7 11/24/2018 0310     Coagulation Profile: Recent Labs  Lab 11/23/18 0640 11/23/18 1027 11/23/18 1820  INR 2.0* 3.7* 2.0*    Cardiac Enzymes: No results for input(s): CKTOTAL, CKMB, CKMBINDEX, TROPONINI in the last 168 hours.  HbA1C: Hgb A1c MFr Bld  Date/Time Value Ref Range Status  03/15/2014 02:34 PM 4.0 <5.7 % Final    Comment:                                                                           According to the ADA Clinical Practice Recommendations for 2011, when HbA1c is used as a screening test:     >=6.5%   Diagnostic of Diabetes Mellitus            (if abnormal result is confirmed)   5.7-6.4%   Increased risk of developing Diabetes Mellitus   References:Diagnosis and Classification of  Diabetes Mellitus,Diabetes Care,2011,34(Suppl 1):S62-S69 and Standards of Medical Care in         Diabetes - 2011,Diabetes Care,2011,34 (Suppl 1):S11-S61.   Result repeated and verified.    CBG: Recent Labs  Lab 11/23/18 1923 11/23/18 1959 11/23/18 2324 11/24/18 7425 11/24/18 9563  GLUCAP 59* 138* 79 87 76   The patient is critically ill with multiple organ systems failure and requires high complexity decision making for assessment and support, frequent evaluation and titration of therapies, application of advanced monitoring technologies and extensive interpretation of multiple databases.   Critical Care Time devoted to patient care services described in this note is  37  Minutes. This time reflects time of care of this signee Dr Koren Bound. This critical care time does not reflect procedure time, or teaching time or supervisory time of PA/NP/Med student/Med Resident etc but could involve care discussion time.  Alyson Reedy, M.D. [facility] Pulmonary/Critical Care Medicine. Pager: [phone]. After hours pager: [phone].

## 2018-11-24 NOTE — Progress Notes (Signed)
CSW went to speak with FOB Makayla Fletcher at bedside. Upon arriving to bedside CSW observed that FOB was standing up while infant was lying in basinet sleeping. CSW congratulated FOB on the birth of infant and exaplined to FOB CSW's role here in the hospital. FOB thanked CSW for coming by and expressed that he is very excited that MOB and infant are doing well. FOB expressed that this is their first child and that both have been very excited about infant. CSW sought further details form FOB on how he has been doing considering everything that has taken place for him over the past two days. Fob expressed that he is fine and that he has been spending a lot of time learning how to change infants diapers, feed her, as well as swaddle her. Fob appeared to be overjoyed with being able to take care and learn new things regarding infant.    CSW asked FOB if they needed any further resources and Fob expressed "wipes and maybe some clothes, I cant wait to dress her". CSW advised FOB  That CSW would get Baby Bundle for infant. FOB expressed that they can afford all other supplies at this time. CSW advised FOB that if they need anything or any other resources or supplies to inform CSW and CSW would try and help at  best. CSW expressed to FOB that CSW would follow up tomorrow to offer further support and needs.     Makayla Fletcher, MSW, LCSW-A [facility] [phone]

## 2018-11-24 NOTE — Procedures (Signed)
Extubation Procedure Note  Patient Details:   Name: BRIARROSE SEGALE DOB: [birth year] MRN: [id number]   Airway Documentation:    Vent end date: 11/24/18 Vent end time: 1039   Evaluation  O2 sats: stable throughout Complications: No apparent complications Patient did tolerate procedure well. Bilateral Breath Sounds: Other (Comment)(coarse)    Pt able to speak. No stridor noted. Extubated to [facility]. Yes   Megan Mans 11/24/2018, 10:42 AM

## 2018-11-24 NOTE — Progress Notes (Signed)
CSW went to check on FOB and infant at bedside. FOB nor infant in room at this time. CSW advised by RN that Fob has been in and out at this time. CSW will try back later to offer support and other resources as warranted.     Claude Manges Arvin Abello, MSW, LCSW-A [facility] [place] [phone]

## 2018-11-25 ENCOUNTER — Inpatient Hospital Stay (HOSPITAL_COMMUNITY): Payer: 59

## 2018-11-25 DIAGNOSIS — D696 Thrombocytopenia, unspecified: Secondary | ICD-10-CM

## 2018-11-25 DIAGNOSIS — R000 Tachycardia, unspecified: Secondary | ICD-10-CM

## 2018-11-25 DIAGNOSIS — I517 Cardiomegaly: Secondary | ICD-10-CM

## 2018-11-25 DIAGNOSIS — N179 Acute kidney failure, unspecified: Secondary | ICD-10-CM

## 2018-11-25 LAB — CBC
HCT: 28.7 % — ABNORMAL LOW (ref 36.0–46.0)
HCT: 29.7 % — ABNORMAL LOW (ref 36.0–46.0)
Hemoglobin: 10 g/dL — ABNORMAL LOW (ref 12.0–15.0)
Hemoglobin: 10.3 g/dL — ABNORMAL LOW (ref 12.0–15.0)
MCH: 29.6 pg (ref 26.0–34.0)
MCH: 29.9 pg (ref 26.0–34.0)
MCHC: 34.8 g/dL (ref 30.0–36.0)
MCHC: 34.7 g/dL (ref 30.0–36.0)
MCV: 84.9 fL (ref 80.0–100.0)
MCV: 86.1 fL (ref 80.0–100.0)
Platelets: 27 10*3/uL — CL (ref 150–400)
Platelets: 39 10*3/uL — ABNORMAL LOW (ref 150–400)
RBC: 3.38 MIL/uL — ABNORMAL LOW (ref 3.87–5.11)
RBC: 3.45 MIL/uL — ABNORMAL LOW (ref 3.87–5.11)
RDW: 15.2 % (ref 11.5–15.5)
RDW: 15.6 % — ABNORMAL HIGH (ref 11.5–15.5)
WBC: 16 10*3/uL — ABNORMAL HIGH (ref 4.0–10.5)
WBC: 19.6 10*3/uL — ABNORMAL HIGH (ref 4.0–10.5)
nRBC: 12.1 % — ABNORMAL HIGH (ref 0.0–0.2)
nRBC: 13.5 % — ABNORMAL HIGH (ref 0.0–0.2)

## 2018-11-25 LAB — BASIC METABOLIC PANEL
Anion gap: 16 — ABNORMAL HIGH (ref 5–15)
BUN: 35 mg/dL — ABNORMAL HIGH (ref 6–20)
CO2: 29 mmol/L (ref 22–32)
Calcium: 8.4 mg/dL — ABNORMAL LOW (ref 8.9–10.3)
Chloride: 93 mmol/L — ABNORMAL LOW (ref 98–111)
Creatinine, Ser: 4.15 mg/dL — ABNORMAL HIGH (ref 0.44–1.00)
GFR calc Af Amer: 16 mL/min — ABNORMAL LOW (ref >60)
GFR calc non Af Amer: 14 mL/min — ABNORMAL LOW (ref >60)
Glucose, Bld: 123 mg/dL — ABNORMAL HIGH (ref 70–99)
Potassium: 4.1 mmol/L (ref 3.5–5.1)
Sodium: 138 mmol/L (ref 135–145)

## 2018-11-25 LAB — ECHOCARDIOGRAM COMPLETE
Height: 62 in
Weight: 2462.1 oz

## 2018-11-25 LAB — GLUCOSE, CAPILLARY
Glucose-Capillary: 97 mg/dL (ref 70–99)
Glucose-Capillary: 90 mg/dL (ref 70–99)
Glucose-Capillary: 100 mg/dL — ABNORMAL HIGH (ref 70–99)

## 2018-11-25 LAB — URINALYSIS, ROUTINE W REFLEX MICROSCOPIC
Bilirubin Urine: NEGATIVE
Glucose, UA: NEGATIVE mg/dL
Ketones, ur: NEGATIVE mg/dL
Leukocytes,Ua: NEGATIVE
Nitrite: NEGATIVE
Protein, ur: 30 mg/dL — AB
RBC / HPF: >50 RBC/hpf — ABNORMAL HIGH (ref 0–5)
Specific Gravity, Urine: 1.011 (ref 1.005–1.030)
pH: 8 (ref 5.0–8.0)

## 2018-11-25 LAB — PREPARE PLATELET PHERESIS: Unit division: 0

## 2018-11-25 LAB — MAGNESIUM: Magnesium: 1.7 mg/dL (ref 1.7–2.4)

## 2018-11-25 LAB — HEPATIC FUNCTION PANEL
ALT: 1073 U/L — ABNORMAL HIGH (ref 0–44)
AST: 2714 U/L — ABNORMAL HIGH (ref 15–41)
Albumin: 2.9 g/dL — ABNORMAL LOW (ref 3.5–5.0)
Alkaline Phosphatase: 104 U/L (ref 38–126)
Bilirubin, Direct: 1.2 mg/dL — ABNORMAL HIGH (ref 0.0–0.2)
Indirect Bilirubin: 1.3 mg/dL — ABNORMAL HIGH (ref 0.3–0.9)
Total Bilirubin: 2.5 mg/dL — ABNORMAL HIGH (ref 0.3–1.2)
Total Protein: 6.3 g/dL — ABNORMAL LOW (ref 6.5–8.1)

## 2018-11-25 LAB — TRIGLYCERIDES: Triglycerides: 358 mg/dL — ABNORMAL HIGH (ref <150)

## 2018-11-25 LAB — PHOSPHORUS: Phosphorus: 7.5 mg/dL — ABNORMAL HIGH (ref 2.5–4.6)

## 2018-11-25 LAB — BPAM PLATELET PHERESIS
Blood Product Expiration Date: 202006062359
ISSUE DATE / TIME: 202006041532
Unit Type and Rh: 6200

## 2018-11-25 MED ORDER — HYDROMORPHONE 1 MG/ML IV SOLN
INTRAVENOUS | Status: DC | PRN
  Administered 2018-11-25: 1.5 mg via INTRAVENOUS
  Administered 2018-11-25: 1.2 mg via INTRAVENOUS
  Administered 2018-11-25 – 2018-11-26 (×2): 2.4 mg via INTRAVENOUS
  Administered 2018-11-26: 1.2 mg via INTRAVENOUS
  Administered 2018-11-26: 2.1 mg via INTRAVENOUS
  Administered 2018-11-26: 25 mg via INTRAVENOUS
  Administered 2018-11-26: 1.8 mg via INTRAVENOUS
  Administered 2018-11-26: 0.6 mg via INTRAVENOUS
  Administered 2018-11-26: 1.8 mg via INTRAVENOUS
  Administered 2018-11-27: 1 mg via INTRAVENOUS
  Filled 2018-11-25 (×2): qty 30

## 2018-11-25 MED ORDER — SODIUM CHLORIDE 0.9 % IV SOLN
INTRAVENOUS | Status: DC
  Administered 2018-11-25 – 2018-11-26 (×4): via INTRAVENOUS

## 2018-11-25 MED ORDER — SODIUM CHLORIDE 0.9 % IV SOLN: INTRAVENOUS | Status: DC | PRN

## 2018-11-25 MED ORDER — MAGNESIUM SULFATE 2 GM/50ML IV SOLN
2.0000 g | Freq: Once | INTRAVENOUS | Status: AC
  Administered 2018-11-25: 2 g via INTRAVENOUS
  Filled 2018-11-25: qty 50

## 2018-11-25 MED ORDER — AMOXICILLIN-POT CLAVULANATE 875-125 MG PO TABS
1.0000 | ORAL_TABLET | Freq: Two times a day (BID) | ORAL | Status: AC
  Administered 2018-11-25 – 2018-11-30 (×11): 1 via ORAL
  Filled 2018-11-25 (×12): qty 1

## 2018-11-25 NOTE — Evaluation (Addendum)
Physical Therapy Evaluation Patient Details Name: Makayla Fletcher MRN: [id number] DOB: 09-07-1995 Today's Date: 11/25/2018   History of Present Illness  23 yo F PMH depression, sickle cell anemia, migraine who on 6/3 underwent spontaneous vaginal delivery complicated by uterine inversion, retained placenta, post-partum hemorrhage with hemorrhagic shock, emergent hysterectomy. Underwent MTP receiving 5.4L crystalloid, 4 Cryo, 2 plt, 8 PRBC, 5 FFP. Remains intubated.  Clinical Impression  Pt admitted with above diagnosis. Pt currently with functional limitations due to the deficits listed below (see PT Problem List). Pt was able to ambulate with min assist 8 feet with rollator limited distance due to pts pain in abdomen.  Pt needed cues for safety and HR to 151 bpm or would have had pt walk further. Desat on RA at rest to 87% and required 2L to keep sats >90%.   Will progress pt as able and anticipate good progress.  Pt will benefit from skilled PT to increase their independence and safety with mobility to allow discharge to the venue listed below.      Follow Up Recommendations Supervision/Assistance - 24 hour;No PT follow up(doubt she will need follow up)    Equipment Recommendations  Rolling walker with 5" wheels;3in1 (PT)(may need equipment but depends on when she d/c's)    Recommendations for Other Services       Precautions / Restrictions Precautions Precautions: Fall Restrictions Weight Bearing Restrictions: No      Mobility  Bed Mobility Overal bed mobility: Needs Assistance Bed Mobility: Supine to Sit     Supine to sit: Mod assist     General bed mobility comments: Pt moved LEs to side of bed but needed assist to raise trunk and pt pulled up on PT.  Transfers Overall transfer level: Needs assistance Equipment used: 4-wheeled walker Transfers: Sit to/from Stand Sit to Stand: Min assist         General transfer comment: Assist to power up due to pain in  abdomen  Ambulation/Gait Ambulation/Gait assistance: Min assist;+2 safety/equipment Gait Distance (Feet): 8 Feet Assistive device: 4-wheeled walker Gait Pattern/deviations: Step-through pattern;Decreased stride length;Wide base of support   Gait velocity interpretation: <1.31 ft/sec, indicative of household ambulator General Gait Details: Pt able to walk to door with rollator.  did not go into hallway as pt sore and HR 151 bpm once she walked that ffar. Brought the chair to the pt. Desat to 87% on RA therefore replaced 2LO2 which kept pt >90%.  Pt pushed PCA x 2 during session.   Stairs            Wheelchair Mobility    Modified Rankin (Stroke Patients Only)       Balance Overall balance assessment: Needs assistance Sitting-balance support: No upper extremity supported;Feet supported Sitting balance-Leahy Scale: Fair     Standing balance support: Bilateral upper extremity supported;During functional activity Standing balance-Leahy Scale: Poor Standing balance comment: relies on UE support for balance                             Pertinent Vitals/Pain Pain Assessment: 0-10 Pain Score: 5  Pain Location: abdomen Pain Descriptors / Indicators: Aching;Grimacing;Guarding Pain Intervention(s): Limited activity within patient's tolerance;Monitored during session;Premedicated before session;Repositioned;PCA encouraged    Home Living Family/patient expects to be discharged to:: Private residence Living Arrangements: Spouse/significant other;Parent Available Help at Discharge: Family;Available 24 hours/day(mother fiancee) Type of Home: Apartment Home Access: Level entry     Home Layout: One level Home  Equipment: None      Prior Function Level of Independence: Independent               Hand Dominance        Extremity/Trunk Assessment   Upper Extremity Assessment Upper Extremity Assessment: Defer to OT evaluation    Lower Extremity  Assessment Lower Extremity Assessment: Generalized weakness    Cervical / Trunk Assessment Cervical / Trunk Assessment: Normal  Communication   Communication: No difficulties  Cognition Arousal/Alertness: Awake/alert Behavior During Therapy: WFL for tasks assessed/performed Overall Cognitive Status: Within Functional Limits for tasks assessed                                        General Comments      Exercises     Assessment/Plan    PT Assessment Patient needs continued PT services  PT Problem List Decreased activity tolerance;Decreased balance;Decreased mobility;Decreased knowledge of use of DME;Decreased safety awareness;Decreased knowledge of precautions;Pain;Decreased skin integrity       PT Treatment Interventions DME instruction;Gait training;Functional mobility training;Therapeutic activities;Therapeutic exercise;Balance training;Patient/family education    PT Goals (Current goals can be found in the Care Plan section)  Acute Rehab PT Goals Patient Stated Goal: to go home PT Goal Formulation: With patient Time For Goal Achievement: 12/09/18 Potential to Achieve Goals: Good    Frequency Min 3X/week   Barriers to discharge        Co-evaluation               AM-PAC PT "6 Clicks" Mobility  Outcome Measure Help needed turning from your back to your side while in a flat bed without using bedrails?: A Lot Help needed moving from lying on your back to sitting on the side of a flat bed without using bedrails?: A Lot Help needed moving to and from a bed to a chair (including a wheelchair)?: A Little Help needed standing up from a chair using your arms (e.g., wheelchair or bedside chair)?: A Little Help needed to walk in hospital room?: A Little Help needed climbing 3-5 steps with a railing? : A Little 6 Click Score: 16    End of Session Equipment Utilized During Treatment: Gait belt;Oxygen Activity Tolerance: Patient limited by  fatigue;Patient limited by pain Patient left: in chair;with call bell/phone within reach;with nursing/sitter in room Nurse Communication: Mobility status PT Visit Diagnosis: Unsteadiness on feet (R26.81);Muscle weakness (generalized) (M62.81);Pain Pain - part of body: (abdomen)    Time: 3845-3646 PT Time Calculation (min) (ACUTE ONLY): 36 min   Charges:   PT Evaluation $PT Eval Moderate Complexity: 1 Mod PT Treatments $Gait Training: 8-22 mins        Shunta Mclaurin,PT Acute Rehabilitation Services Pager:  [phone]  Office:  [phone]    Berline Lopes 11/25/2018, 11:20 AM

## 2018-11-25 NOTE — Progress Notes (Signed)
  Echocardiogram 2D Echocardiogram has been performed.  Makayla Fletcher 11/25/2018, 3:24 PM

## 2018-11-25 NOTE — Consult Note (Signed)
[facility] Renal Consultation Note  Requesting MD: Pickens Indication for Consultation:  AKI  HPI:  Makayla Fletcher is a 23 y.o. female with sickle cell anemia- she delivered a healthy baby girl on 6/3 but then had a retained placenta and a uterine inversion so had to be taken to the OR after delivery for exploration - ended up needing to have a hysterectomy- transient pressors and also IV contrast for imaging-  She suffered from a lot of blood loss, hemodynamic instability and then AKI-   crt went from 1 to 4.15 here today.  She appears to be having a post injury diuresis- - had 5300 of urine out on pod #1 and then 8500 out yesterday- given lasix.  Also has elevated LFT's    Creat  Date/Time Value Ref Range Status  03/08/2017 10:13 AM 0.62 0.50 - 1.10 mg/dL Final  56/43/3295 18:84 PM 0.66 0.50 - 1.10 mg/dL Final  16/60/6301 60:10 PM 0.64 0.50 - 1.10 mg/dL Final  93/23/5573 22:02 PM 0.51 0.50 - 1.10 mg/dL Final  54/27/0623 76:28 PM 0.56 0.50 - 1.10 mg/dL Final  31/51/7616 07:37 PM 0.54 0.50 - 1.10 mg/dL Final   Creatinine, Ser  Date/Time Value Ref Range Status  11/25/2018 04:53 AM 4.15 (H) 0.44 - 1.00 mg/dL Final  10/62/6948 54:62 PM 3.19 (H) 0.44 - 1.00 mg/dL Final  70/35/0093 81:82 AM 2.74 (H) 0.44 - 1.00 mg/dL Final    Comment:    REPEATED TO VERIFY  11/23/2018 10:27 AM 1.02 (H) 0.44 - 1.00 mg/dL Final  99/37/1696 78:93 PM 0.52 (L) 0.57 - 1.00 mg/dL Final  81/06/7508 25:85 PM 0.62 0.57 - 1.00 mg/dL Final  27/78/2423 53:61 AM 0.60 0.44 - 1.00 mg/dL Final  44/31/5400 86:76 AM 0.58 0.44 - 1.00 mg/dL Final  19/50/9326 71:24 AM 0.64 0.44 - 1.00 mg/dL Final  58/02/9832 82:50 PM 0.64 0.44 - 1.00 mg/dL Final  53/97/6734 19:37 PM 0.60 0.44 - 1.00 mg/dL Final  90/24/0973 53:29 AM 0.55 0.44 - 1.00 mg/dL Final  92/42/6834 19:62 PM 0.58 0.50 - 1.10 mg/dL Final  22/97/9892 11:94 PM 0.59 0.50 - 1.10 mg/dL Final  17/40/8144 81:85 AM 0.74 0.50 - 1.10 mg/dL Final  63/14/9702  63:78 AM 0.42 (L) 0.50 - 1.10 mg/dL Final  58/85/0277 41:28 AM 0.40 (L) 0.50 - 1.10 mg/dL Final  78/67/6720 94:70 AM 0.41 (L) 0.50 - 1.10 mg/dL Final  96/28/3662 94:76 PM 0.39 (L) 0.50 - 1.10 mg/dL Final  54/65/0354 65:68 AM 0.41 (L) 0.50 - 1.10 mg/dL Final  12/75/1700 17:49 AM 0.48 (L) 0.50 - 1.10 mg/dL Final  44/96/7591 63:84 AM 0.58 0.50 - 1.10 mg/dL Final  66/59/9357 01:77 PM 0.49 (L) 0.50 - 1.10 mg/dL Final  93/90/3009 23:30 PM 0.57 0.47 - 1.00 mg/dL Final  07/62/2633 35:45 AM 0.50 0.47 - 1.00 mg/dL Final  62/56/3893 73:42 PM 0.63 0.47 - 1.00 mg/dL Final  87/68/1157 26:20 PM 0.7  Final  02/09/2007 03:41 AM 0.46  Final  12/01/2006 05:20 PM 0.47  Final     PMHx:   Past Medical History:  Diagnosis Date  . Depression   . Migraine without aura, without mention of intractable migraine without mention of status migrainosus   . Sickle cell anemia (HCC)   . Urinary tract infection July 2013   frequent     Past Surgical History:  Procedure Laterality Date  . ABDOMINAL HYSTERECTOMY N/A 11/23/2018   Procedure: HYSTERECTOMY ABDOMINAL;  Surgeon: [place] Bing, MD;  Location: [facility] LD ORS;  Service: Gynecology;  Laterality: N/A;  . CYSTOSCOPY N/A 11/23/2018   Procedure: CYSTOSCOPY;  Surgeon: [place] Bing, MD;  Location: [facility]S;  Service: Gynecology;  Laterality: N/A;  . DILATION AND CURETTAGE OF UTERUS N/A 11/23/2018   Procedure: DILATATION AND CURETTAGE;  Surgeon: [place] Bing, MD;  Location: [facility] LD ORS;  Service: Gynecology;  Laterality: N/A;  . LAPAROTOMY N/A 11/23/2018   Procedure: EXPLORATORY LAPAROTOMY;  Surgeon: Kimberly Bing, MD;  Location: [facility];  Service: Gynecology;  Laterality: N/A;  . NO PAST SURGERIES    . WISDOM TOOTH EXTRACTION      Family Hx:  Family History  Problem Relation Age of Onset  . Migraines Father     Social History:  reports that she has never smoked. She has never used smokeless tobacco. She reports previous alcohol use. She reports that  she does not use drugs.  Allergies: No Known Allergies  Medications: Prior to Admission medications   Medication Sig Start Date End Date Taking? Authorizing Provider  prenatal vitamin w/FE, FA (PRENATAL 1 + 1) 27-1 MG TABS tablet Take 1 tablet by mouth daily at 12 noon.   Yes [provider]  ferrous sulfate 325 (65 FE) MG tablet TAKE 1 TABLET BY MOUTH TWICE A DAY 11/23/18   Brock Bad, MD  folic acid (FOLVITE) 1 MG tablet TAKE 1 TABLET (1 MG TOTAL) BY MOUTH DAILY. Patient not taking: Reported on 11/22/2018 03/08/17   Massie Maroon, FNP  hydrocortisone (ANUSOL-HC) 25 MG suppository Place 1 suppository (25 mg total) rectally 2 (two) times daily. Patient not taking: Reported on 11/10/2018 10/14/18   Hermina Staggers, MD    I have reviewed the patient's current medications.  Labs:  Results for orders placed or performed during the hospital encounter of 11/22/18 (from the past 48 hour(s))  Glucose, capillary     Status: None   Collection Time: 11/23/18  3:31 PM  Result Value Ref Range   Glucose-Capillary 71 70 - 99 mg/dL  CBC     Status: Abnormal   Collection Time: 11/23/18  6:20 PM  Result Value Ref Range   WBC 7.7 4.0 - 10.5 K/uL   RBC 1.71 (L) 3.87 - 5.11 MIL/uL   Hemoglobin 5.2 (LL) 12.0 - 15.0 g/dL    Comment: REPEATED TO VERIFY THIS CRITICAL RESULT HAS VERIFIED AND BEEN CALLED TO D PARL,RN BY WALTER BOND ON 06 03 2020 AT 1908, AND HAS BEEN READ BACK.     HCT 14.5 (L) 36.0 - 46.0 %   MCV 84.8 80.0 - 100.0 fL   MCH 30.4 26.0 - 34.0 pg   MCHC 35.9 30.0 - 36.0 g/dL   RDW 16.1 09.6 - 04.5 %   Platelets 21 (LL) 150 - 400 K/uL    Comment: REPEATED TO VERIFY SPECIMEN CHECKED FOR CLOTS Immature Platelet Fraction may be clinically indicated, consider ordering this additional test [id number] CRITICAL VALUE NOTED.  VALUE IS CONSISTENT WITH PREVIOUSLY REPORTED AND CALLED VALUE.    nRBC 9.0 (H) 0.0 - 0.2 %    Comment: Performed at [facility], [address]  [place], [place] [address]  Calcium, ionized     Status: Abnormal   Collection Time: 11/23/18  6:20 PM  Result Value Ref Range   Calcium, Ionized, Serum 4.1 (L) 4.5 - 5.6 mg/dL    Comment: (NOTE) Performed At: [facility] [address] [place], [place] [id number] Jolene Schimke MD HQ:[id number]   DIC (disseminated intravasc coag) panel  Status: Abnormal   Collection Time: 11/23/18  6:20 PM  Result Value Ref Range   Prothrombin Time 22.5 (H) 11.4 - 15.2 seconds   INR 2.0 (H) 0.8 - 1.2    Comment: (NOTE) INR goal varies based on device and disease states.    aPTT 48 (H) 24 - 36 seconds    Comment:        IF BASELINE aPTT IS ELEVATED, SUGGEST PATIENT RISK ASSESSMENT BE USED TO DETERMINE APPROPRIATE ANTICOAGULANT THERAPY.    Fibrinogen 302 210 - 475 mg/dL   D-Dimer, Quant >16.10 (H) 0.00 - 0.50 ug/mL-FEU    Comment: REPEATED TO VERIFY (NOTE) At the manufacturer cut-off of 0.50 ug/mL FEU, this assay has been documented to exclude PE with a sensitivity and negative predictive value of 97 to 99%.  At this time, this assay has not been approved by the FDA to exclude DVT/VTE. Results should be correlated with clinical presentation.    Platelets 20 (LL) 150 - 400 K/uL    Comment: Immature Platelet Fraction may be clinically indicated, consider ordering this additional test [id number] REPEATED TO VERIFY SPECIMEN CHECKED FOR CLOTS CRITICAL VALUE NOTED.  VALUE IS CONSISTENT WITH PREVIOUSLY REPORTED AND CALLED VALUE.    Smear Review FEW SCHISTOCYTES NOTED     Comment: Performed at [facility], [address], [place], [place] [address]  Prepare RBC     Status: None   Collection Time: 11/23/18  7:18 PM  Result Value Ref Range   Order Confirmation      ORDER PROCESSED BY BLOOD BANK Performed at [facility], [address] [place], [place] [address]   Prepare fresh frozen plasma     Status: None   Collection Time: 11/23/18  7:19 PM  Result Value Ref Range    Unit Number [id number]    Blood Component Type THAWED PLASMA    Unit division 00    Status of Unit ISSUED,FINAL    Transfusion Status OK TO TRANSFUSE    Unit Number [id number]    Blood Component Type THAWED PLASMA    Unit division 00    Status of Unit ISSUED,FINAL    Transfusion Status OK TO TRANSFUSE    Unit Number [id number]    Blood Component Type THAWED PLASMA    Unit division 00    Status of Unit ISSUED,FINAL    Transfusion Status      OK TO TRANSFUSE Performed at [facility], [address] [place], [place] [address]   Prepare Pheresed Platelets     Status: None   Collection Time: 11/23/18  7:21 PM  Result Value Ref Range   Unit Number [id number]    Blood Component Type PLTP LR3 PAS    Unit division 00    Status of Unit ISSUED,FINAL    Transfusion Status      OK TO TRANSFUSE Performed at [facility], [address] [place], [place] [address]   Glucose, capillary     Status: Abnormal   Collection Time: 11/23/18  7:23 PM  Result Value Ref Range   Glucose-Capillary 59 (L) 70 - 99 mg/dL  Glucose, capillary     Status: Abnormal   Collection Time: 11/23/18  7:59 PM  Result Value Ref Range   Glucose-Capillary 138 (H) 70 - 99 mg/dL  Glucose, capillary     Status: None   Collection Time: 11/23/18 11:24 PM  Result Value Ref Range   Glucose-Capillary 79 70 - 99 mg/dL  CBC     Status: Abnormal   Collection Time: 11/24/18  1:25 AM  Result Value Ref Range   WBC 10.7 (H) 4.0 - 10.5 K/uL   RBC 3.28 (L) 3.87 - 5.11 MIL/uL   Hemoglobin 10.0 (L) 12.0 - 15.0 g/dL    Comment: REPEATED TO VERIFY POST TRANSFUSION SPECIMEN DELTA CHECK NOTED    HCT 27.7 (L) 36.0 - 46.0 %   MCV 84.5 80.0 - 100.0 fL   MCH 30.5 26.0 - 34.0 pg   MCHC 36.1 (H) 30.0 - 36.0 g/dL   RDW 16.1 09.6 - 04.5 %   Platelets 40 (L) 150 - 400 K/uL    Comment: REPEATED TO VERIFY DELTA CHECK NOTED POST TRANSFUSION SPECIMEN Immature Platelet Fraction may be clinically indicated,  consider ordering this additional test [id number] CONSISTENT WITH PREVIOUS RESULT    nRBC 15.0 (H) 0.0 - 0.2 %    Comment: Performed at [facility], [address] [place], [place] [address]  Fibrinogen     Status: None   Collection Time: 11/24/18  1:25 AM  Result Value Ref Range   Fibrinogen 335 210 - 475 mg/dL    Comment: Performed at [facility], [address] [place], [place] [address]  Blood gas, arterial     Status: Abnormal   Collection Time: 11/24/18  3:10 AM  Result Value Ref Range   FIO2 0.30    Delivery systems VENTILATOR    Mode PRESSURE REGULATED VOLUME CONTROL    VT 400 mL   LHR 16 resp/min   Peep/cpap 5.0 cm H20   pH, Arterial 7.462 (H) 7.350 - 7.450   pCO2 arterial 38.3 32.0 - 48.0 mmHg   pO2, Arterial 74.6 (L) 83.0 - 108.0 mmHg   Bicarbonate 26.8 20.0 - 28.0 mmol/L   Acid-Base Excess 3.3 (H) 0.0 - 2.0 mmol/L   O2 Saturation 95.7 %   Patient temperature 99.7    Collection site A-LINE    Drawn by [phone]    Sample type ARTERIAL DRAW     Comment: Performed at [facility], [address] [place], [place] [address]  Glucose, capillary     Status: None   Collection Time: 11/24/18  3:26 AM  Result Value Ref Range   Glucose-Capillary 87 70 - 99 mg/dL  CBC     Status: Abnormal   Collection Time: 11/24/18  7:32 AM  Result Value Ref Range   WBC 15.1 (H) 4.0 - 10.5 K/uL   RBC 3.19 (L) 3.87 - 5.11 MIL/uL   Hemoglobin 9.8 (L) 12.0 - 15.0 g/dL   HCT 29.5 (L) 28.4 - 13.2 %   MCV 84.6 80.0 - 100.0 fL   MCH 30.7 26.0 - 34.0 pg   MCHC 36.3 (H) 30.0 - 36.0 g/dL   RDW 44.0 10.2 - 72.5 %   Platelets 30 (L) 150 - 400 K/uL    Comment: REPEATED TO VERIFY Immature Platelet Fraction may be clinically indicated, consider ordering this additional test [id number] CONSISTENT WITH PREVIOUS RESULT    nRBC 16.7 (H) 0.0 - 0.2 %    Comment: Performed at [facility], [address], [place], [place] [address]  Fibrinogen     Status: None   Collection  Time: 11/24/18  7:32 AM  Result Value Ref Range   Fibrinogen 361 210 - 475 mg/dL    Comment: Performed at [facility], [address] [place], [place] [address]  Triglycerides  Status: Abnormal   Collection Time: 11/24/18  7:33 AM  Result Value Ref Range   Triglycerides 511 (H) <150 mg/dL    Comment: Performed at [facility], [address], [place], [place] [address]  Basic metabolic panel     Status: Abnormal   Collection Time: 11/24/18  7:33 AM  Result Value Ref Range   Sodium 140 135 - 145 mmol/L   Potassium 3.7 3.5 - 5.1 mmol/L   Chloride 101 98 - 111 mmol/L   CO2 26 22 - 32 mmol/L   Glucose, Bld 81 70 - 99 mg/dL   BUN 20 6 - 20 mg/dL   Creatinine, Ser 6.04 (H) 0.44 - 1.00 mg/dL    Comment: REPEATED TO VERIFY   Calcium 7.8 (L) 8.9 - 10.3 mg/dL   GFR calc non Af Amer 23 (L) >60 mL/min   GFR calc Af Amer 27 (L) >60 mL/min   Anion gap 13 5 - 15    Comment: Performed at [facility], [address] [place], [place] [address]  Phosphorus     Status: Abnormal   Collection Time: 11/24/18  7:33 AM  Result Value Ref Range   Phosphorus 6.6 (H) 2.5 - 4.6 mg/dL    Comment: Performed at [facility], [address] [place], [place] [address]  Magnesium     Status: Abnormal   Collection Time: 11/24/18  7:33 AM  Result Value Ref Range   Magnesium 1.6 (L) 1.7 - 2.4 mg/dL    Comment: Performed at [facility], [address], [place], [place] [address]  Glucose, capillary     Status: None   Collection Time: 11/24/18  7:38 AM  Result Value Ref Range   Glucose-Capillary 76 70 - 99 mg/dL  CBC     Status: Abnormal   Collection Time: 11/24/18 10:00 AM  Result Value Ref Range   WBC 15.9 (H) 4.0 - 10.5 K/uL   RBC 3.24 (L) 3.87 - 5.11 MIL/uL   Hemoglobin 9.8 (L) 12.0 - 15.0 g/dL   HCT 62.1 (L) 30.8 - 65.7 %   MCV 84.6 80.0 - 100.0 fL   MCH 30.2 26.0 - 34.0 pg   MCHC 35.8 30.0 - 36.0 g/dL   RDW 84.6 96.2 - 95.2 %   Platelets 30 (L) 150 - 400 K/uL     Comment: REPEATED TO VERIFY Immature Platelet Fraction may be clinically indicated, consider ordering this additional test [id number] CONSISTENT WITH PREVIOUS RESULT    nRBC 13.5 (H) 0.0 - 0.2 %    Comment: Performed at [facility], [address], [place], [place] [address]  Glucose, capillary     Status: None   Collection Time: 11/24/18 11:33 AM  Result Value Ref Range   Glucose-Capillary 75 70 - 99 mg/dL  CBC     Status: Abnormal   Collection Time: 11/24/18  1:50 PM  Result Value Ref Range   WBC 16.0 (H) 4.0 - 10.5 K/uL   RBC 3.38 (L) 3.87 - 5.11 MIL/uL   Hemoglobin 10.0 (L) 12.0 - 15.0 g/dL   HCT 53.6 (L) 64.4 - 03.4 %   MCV 84.9 80.0 - 100.0 fL   MCH 29.6 26.0 - 34.0 pg   MCHC 34.8 30.0 - 36.0 g/dL   RDW 74.2 59.5 - 63.8 %   Platelets 27 (LL) 150 - 400 K/uL    Comment: REPEATED TO VERIFY PLATELET COUNT CONFIRMED BY SMEAR SPECIMEN CHECKED FOR CLOTS Immature  Platelet Fraction may be clinically indicated, consider ordering this additional test [id number] CRITICAL RESULT CALLED TO, READ BACK BY AND VERIFIED WITH: Lennox Pippins RN AT 1427 ON [id number] BY [facility] CORRECTED RESULT CALLED TO A.CLARK,RN ON 11/25/2018 BY DAVISB CORRECTED ON 06/05 AT 1007: PREVIOUSLY REPORTED AS <30 REPEATED TO VERIFY PLATELET COUNT CONFIRMED BY SMEAR SPECIMEN CHECKED FOR CLOTS Immature Platelet Fraction may be clinically indicated, consider ordering this additional test [id number] CRITICAL RESULT  CALLED TO, READ BACK BY AND VERIFIED WITH: K BROWN RN AT 1427 ON [id number] BY [facility]    nRBC 12.1 (H) 0.0 - 0.2 %    Comment: Performed at [facility], [address], [place], [place] [address]  Basic metabolic panel     Status: Abnormal   Collection Time: 11/24/18  1:50 PM  Result Value Ref Range   Sodium 142 135 - 145 mmol/L   Potassium 4.2 3.5 - 5.1 mmol/L   Chloride 100 98 - 111 mmol/L   CO2 27 22 - 32 mmol/L   Glucose, Bld 78 70 - 99 mg/dL   BUN 25 (H) 6 - 20 mg/dL   Creatinine, Ser 2.95 (H) 0.44 -  1.00 mg/dL   Calcium 8.2 (L) 8.9 - 10.3 mg/dL   GFR calc non Af Amer 19 (L) >60 mL/min   GFR calc Af Amer 22 (L) >60 mL/min   Anion gap 15 5 - 15    Comment: Performed at [facility], [address] [place], [place] [address]  Fibrinogen     Status: None   Collection Time: 11/24/18  1:50 PM  Result Value Ref Range   Fibrinogen 409 210 - 475 mg/dL    Comment: Performed at [facility], [address], [place], [place] [address]  Protime-INR     Status: Abnormal   Collection Time: 11/24/18  1:50 PM  Result Value Ref Range   Prothrombin Time 19.0 (H) 11.4 - 15.2 seconds   INR 1.6 (H) 0.8 - 1.2    Comment: (NOTE) INR goal varies based on device and disease states. Performed at [facility], [address], [place], [place] [address]   APTT     Status: None   Collection Time: 11/24/18  1:50 PM  Result Value Ref Range   aPTT 36 24 - 36 seconds    Comment: Performed at [facility], [address], [place], [place] [address]  Prepare Pheresed Platelets     Status: None   Collection Time: 11/24/18  2:42 PM  Result Value Ref Range   Unit Number [id number]    Blood Component Type PLTP LR3 PAS    Unit division 00    Status of Unit ISSUED,FINAL    Transfusion Status      OK TO TRANSFUSE Performed at [facility], [address], [place], [place] [address]   Glucose, capillary     Status: Abnormal   Collection Time: 11/24/18  4:20 PM  Result Value Ref Range   Glucose-Capillary 67 (L) 70 - 99 mg/dL  Glucose, capillary     Status: None   Collection Time: 11/24/18  7:57 PM  Result Value Ref Range   Glucose-Capillary 90 70 - 99 mg/dL  Platelet count     Status: Abnormal   Collection Time: 11/24/18  7:58 PM  Result Value Ref Range   Platelets 43 (L) 150 - 400 K/uL    Comment: REPEATED TO VERIFY Immature Platelet Fraction may be  clinically indicated, consider ordering this additional test [id number] CONSISTENT WITH PREVIOUS RESULT Performed at  [facility], [address] [place], [place] [address]   Triglycerides     Status: Abnormal   Collection Time: 11/25/18  4:53 AM  Result Value Ref Range   Triglycerides 358 (H) <150 mg/dL    Comment: Performed at [facility], [address] [place], [place] [address]  CBC     Status: Abnormal   Collection Time: 11/25/18  4:53 AM  Result Value Ref Range   WBC 19.6 (H) 4.0 - 10.5 K/uL   RBC 3.45 (L) 3.87 - 5.11 MIL/uL   Hemoglobin 10.3 (L) 12.0 - 15.0 g/dL   HCT 78.229.7 (L) 95.636.0 - 21.346.0 %   MCV 86.1 80.0 - 100.0 fL   MCH 29.9 26.0 - 34.0 pg   MCHC 34.7 30.0 - 36.0 g/dL   RDW 08.615.6 (H) 57.811.5 - 46.915.5 %   Platelets 39 (L) 150 - 400 K/uL    Comment: REPEATED TO VERIFY Immature Platelet Fraction may be clinically indicated, consider ordering this additional test [id number] CONSISTENT WITH PREVIOUS RESULT    nRBC 13.5 (H) 0.0 - 0.2 %    Comment: Performed at [facility], [address] [place], [place] [address]  Basic metabolic panel     Status: Abnormal   Collection Time: 11/25/18  4:53 AM  Result Value Ref Range   Sodium 138 135 - 145 mmol/L   Potassium 4.1 3.5 - 5.1 mmol/L   Chloride 93 (L) 98 - 111 mmol/L   CO2 29 22 - 32 mmol/L   Glucose, Bld 123 (H) 70 - 99 mg/dL   BUN 35 (H) 6 - 20 mg/dL   Creatinine, Ser 1.024.15 (H) 0.44 - 1.00 mg/dL   Calcium 8.4 (L) 8.9 - 10.3 mg/dL   GFR calc non Af Amer 14 (L) >60 mL/min   GFR calc Af Amer 16 (L) >60 mL/min   Anion gap 16 (H) 5 - 15    Comment: Performed at [facility], [address] [place], [place] [address]  Magnesium     Status: None   Collection Time: 11/25/18  4:53 AM  Result Value Ref Range   Magnesium 1.7 1.7 - 2.4 mg/dL    Comment: Performed at [facility], [address] [place], [place] [address]  Phosphorus     Status: Abnormal   Collection Time: 11/25/18  4:53 AM  Result Value Ref Range   Phosphorus 7.5 (H) 2.5 - 4.6 mg/dL    Comment: Performed at [facility], [address] [place], [place] [address]  Hepatic function panel     Status: Abnormal   Collection Time: 11/25/18  4:53 AM  Result Value Ref Range   Total Protein 6.3 (L) 6.5 - 8.1 g/dL   Albumin 2.9 (L) 3.5 - 5.0 g/dL   AST 9,5632,714 (H) 15 - 41 U/L    Comment: RESULTS CONFIRMED BY MANUAL DILUTION   ALT 1,073 (H) 0 - 44 U/L   Alkaline Phosphatase 104 38 - 126 U/L   Total Bilirubin 2.5 (H) 0.3 - 1.2 mg/dL   Bilirubin, Direct 1.2 (H) 0.0 - 0.2 mg/dL   Indirect Bilirubin 1.3 (H) 0.3 - 0.9 mg/dL    Comment: Performed at [facility], [address] [place], [place] [address]  Glucose, capillary     Status: None   Collection Time: 11/25/18  7:33 AM  Result Value Ref Range   Glucose-Capillary 97 70 - 99 mg/dL  Glucose, capillary     Status: None   Collection Time: 11/25/18 12:07 PM  Result Value Ref Range   Glucose-Capillary 90 70 - 99 mg/dL     ROS:  A comprehensive review of systems was negative except for: Gastrointestinal: positive for abdominal pain  Physical Exam: Vitals:   11/25/18 1100 11/25/18 1200  BP: (!) 121/92 (!) 118/92  Pulse: (!) 136 (!) 130  Resp: (!) 26 18  Temp:  98.9 F (37.2 C)  SpO2: 99% 99%     General: seems weak, tender HEENT: PERRLA, EOMI, mucous membranes moist  Neck: no JVD Heart: tachy  Lungs: clear Abdomen: soft, mild tenderness Extremities: min edema Skin: warm and dry  Neuro: alert, non focal   Assessment/Plan: 23 year old BF with sickle cell s/p pregnancy with complication of retained placenta, bleed and hemodynamic instability requiring urgent surgery and resuscitation - has developed AKI 1.Renal- AKI in the setting of hemodynamic instability/emergency surgery for ABLA and also IV contrast-  Having a post injury diuresis.  It feels like ATN due to reduced renal perfusion at the time of crisis.  The fact that she is having a post injury diuresis is a good sign.  We just have not seen the clearance start yet.  Will check a U/A to look for other causes  of AKI. I feel very optomistic that this will turn around.  There are no indications for renal replacement therapy right now.  Need to make sure are keeping up with urinary losses- have ordered IVF   2. Hypertension/volume  - making lots of urine- post injury diuresis- no more lasix and need to keep up with losses with IVF- have started at 150 per hour- she is taking in Pos but cannot keep up   4. Anemia  - ABLA due to events- recovered after transfusions.  If has sickle cell likely her baseline hgb is low 5, Elevated LFT's - also due to shock.  Need to follow for correction - lft s ordered for the am   Cecille Aver 11/25/2018, 2:21 PM

## 2018-11-25 NOTE — Progress Notes (Signed)
Obstetrics Daily Progress Note  Admission Date: 11/22/2018 Current Date: 11/25/2018 9:42 AM  Makayla Fletcher is a 23 y.o. [id number] POD#2 EUA, supracervical hysterectomy,cysto for [facility] @ 39wks, admitted for hemoglobin [place] IOL  Pregnancy complicated by: Patient Active Problem List   Diagnosis Date Noted  . AKI (acute kidney injury) (HCC) 11/23/2018  . Abnormal transaminases 11/23/2018  . Sickle cell disease (HCC) 11/22/2018  . Encounter for induction of labor 11/22/2018  . Maternal sickle cell anemia affecting pregnancy, antepartum (HCC) 05/11/2018  . Supervision of normal first pregnancy, antepartum 05/04/2018  . Vitamin D deficiency 06/06/2015  . Migraine without aura 12/18/2013  . 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 Sickle cell anemia (HCC) 06/17/2012  . Depression, major, single episode, moderate (HCC) 07/31/2011  . Generalized anxiety disorder 07/31/2011    Overnight/24hr events:  1U of platelets yesterday afternoon Started on PCA o/n  Subjective:  No nausea, fevers, chills, chest pain, sob. Pain controlled with PCA. Walked with PT today  Objective:    Current Vital Signs 24h Vital Sign Ranges  T 99.7 F (37.6 C) Temp  Avg: 98.7 F (37.1 C)  Min: 98.4 F (36.9 C)  Max: 99.7 F (37.6 C)  BP 118/87 BP  Min: 110/84  Max: 132/88  HR (!) 132 Pulse  Avg: 126.8  Min: 102  Max: 147  RR (!) 24 Resp  Avg: 25.8  Min: 18  Max: 34  SaO2 99 % Nasal Cannula SpO2  Avg: 99 %  Min: 96 %  Max: 100 %       24 Hour I/O Current Shift I/O  Time Ins Outs 06/04 0701 - 06/05 0700 In: 1934.5 [P.O.:870; I.V.:460.3] Out: 8525 [Urine:8525] No intake/output data recorded.   Patient Vitals for the past 24 hrs:  BP Temp Temp src Pulse Resp SpO2  11/25/18 0826 - - - - (!) 24 99 %  11/25/18 0800 118/87 99.7 F (37.6 C) Oral (!) 132 (!) 23 98 %  11/25/18 0600 121/79 - - (!) 139 (!) 23 96 %  11/25/18 0500 (!) 121/91 - - (!) 134 (!) 30 100 %  11/25/18 0400 110/84 - -  (!) 140 (!) 24 97 %  11/25/18 0353 - - - - (!) 23 100 %  11/25/18 0300 116/83 98.4 F (36.9 C) Axillary (!) 137 (!) 27 98 %  11/25/18 0200 114/86 - - (!) 141 18 98 %  11/25/18 0100 123/80 - - (!) 139 (!) 25 98 %  11/25/18 0000 (!) 118/91 - - (!) 138 (!) 25 99 %  11/24/18 2357 - - - - 20 100 %  11/24/18 2300 (!) 128/91 98.4 F (36.9 C) - (!) 147 20 97 %  11/24/18 2200 123/88 - - (!) 140 (!) 28 100 %  11/24/18 2100 119/83 - - (!) 140 (!) 25 97 %  11/24/18 2009 - - - - (!) 25 100 %  11/24/18 2000 119/89 98.4 F (36.9 C) Oral (!) 132 (!) 23 99 %  11/24/18 1800 (!) 126/91 - - (!) 125 (!) 26 100 %  11/24/18 1752 - - - - (!) 32 100 %  11/24/18 1700 122/75 - - (!) 127 (!) 31 99 %  11/24/18 1600 116/78 - - (!) 116 (!) 21 100 %  11/24/18 1530 - 98.4 F (36.9 C) Oral (!) 114 (!) 33 100 %  11/24/18 1500 119/77 - - (!) 116 (!) 34 100 %  11/24/18 1400 119/85 - - (!) 111 (!)  28 100 %  11/24/18 1300 120/82 - - (!) 114 (!) 28 100 %  11/24/18 1200 126/85 - - (!) 102 (!) 24 100 %  11/24/18 1131 - 98.8 F (37.1 C) Oral - - -  11/24/18 1100 129/81 - - (!) 107 (!) 31 99 %  11/24/18 1044 - - - (!) 117 - 99 %  11/24/18 1000 132/88 - - (!) 109 (!) 26 99 %   UOP: >259mL/hr  Physical exam: General: NAD, watching TV Abdomen: +BS, unchanged distension (moderate), appropriately ttp. C/d/i with old blood on it GU: no gross VB. Foley in place with UOP (clear-yellow)  Medications: Current Facility-Administered Medications  Medication Dose Route Frequency Provider Last Rate Last Dose  . 0.9 %  sodium chloride infusion   Intravenous PRN Melene Plan, MD      . Ampicillin-Sulbactam (UNASYN) 3 g in sodium chloride 0.9 % 100 mL IVPB  3 g Intravenous Q8H Alyson Reedy, MD 200 mL/hr at 11/25/18 0600    . Chlorhexidine Gluconate Cloth 2 % PADS 6 each  6 each Topical Q0600 Alyson Reedy, MD      . coconut oil  1 application Topical PRN Levie Heritage, DO      . witch hazel-glycerin (TUCKS) pad 1  application  1 application Topical PRN Levie Heritage, DO       And  . dibucaine (NUPERCAINAL) 1 % rectal ointment 1 application  1 application Rectal PRN Levie Heritage, DO      . diphenhydrAMINE (BENADRYL) injection 12.5 mg  12.5 mg Intravenous Q6H PRN Reva Bores, MD       Or  . diphenhydrAMINE (BENADRYL) 12.5 MG/5ML elixir 12.5 mg  12.5 mg Oral Q6H PRN Reva Bores, MD   12.5 mg at 11/25/18 0830  . HYDROmorphone (DILAUDID) 1 mg/mL PCA injection   Intravenous Q4H PRN Levie Heritage, DO      . LORazepam (ATIVAN) injection 1 mg  1 mg Intravenous Q6H PRN Lazaro Arms, MD   1 mg at 11/24/18 2147  . MEDLINE mouth rinse  15 mL Mouth Rinse BID Alyson Reedy, MD   15 mL at 11/24/18 2156  . naloxone [facility]) injection 0.4 mg  0.4 mg Intravenous PRN Reva Bores, MD       And  . sodium chloride flush (NS) 0.9 % injection 9 mL  9 mL Intravenous PRN Reva Bores, MD      . ondansetron [facility]) injection 4 mg  4 mg Intravenous Q6H PRN Reva Bores, MD        Labs:  Recent Labs  Lab 11/24/18 1000 11/24/18 1350 11/24/18 1958 11/25/18 0453  WBC 15.9* 16.0*  --  19.6*  HGB 9.8* 10.0*  --  10.3*  HCT 27.4* 28.7*  --  29.7*  PLT 30* <30* 43* 39*    Recent Labs  Lab 11/23/18 1027  11/24/18 0733 11/24/18 1350 11/25/18 0453  NA 141   < > 140 142 138  K 4.8   < > 3.7 4.2 4.1  CL 110  --  101 100 93*  CO2 20*  --  26 27 29   BUN 7  --  20 25* 35*  CREATININE 1.02*  --  2.74* 3.19* 4.15*  CALCIUM 7.1*  --  7.8* 8.2* 8.4*  PROT 3.4*  --   --   --   --   BILITOT 4.9*  --   --   --   --  ALKPHOS 48  --   --   --   --   ALT 349*  --   --   --   --   AST 663*  --   --   --   --   GLUCOSE 116*  --  81 78 123*   < > = values in this interval not displayed.    6/5 @ 0500 Phosphorus: 7.5 Magnesium: 1.7   Radiology:  CLINICAL DATA:  Acute pulmonary edema.  EXAM: PORTABLE CHEST 1 VIEW  COMPARISON:  Radiograph of November 24, 2018.  FINDINGS: Stable cardiomediastinal  silhouette. No pneumothorax is noted. Endotracheal and nasogastric tubes have been removed. Left lung is clear. Stable right basilar opacity is noted concerning for atelectasis or infiltrate. Bony thorax is unremarkable.  IMPRESSION: Endotracheal and nasogastric tubes have been removed. Stable right basilar opacity is noted concerning for atelectasis or infiltrate.   Electronically Signed   By: Lupita RaiderJames  Green Jr M.D.   On: 11/25/2018 07:05  Assessment & Plan:  Patient stable *Postpartum: lactation seeing patient *Postop: Continue dilaudid PCA *Heme: Stable CBC. No e/o active bleeding *Renal: Most likely due to PP hemorrhage, hypovolemia. D/w Dr. Kathrene BongoGoldsborough of Renal and will come see.  *CV: will get echo. Continue telemetry *FEN/GI: will change from regular to full since patient only eating liquids, popsicle.  -6/4: Patient passed swallow study *Pulmonary: ET removed on 6/4. CCM signed off. Can call with any questions. Can transfer to progressive care unit. Continue to watch for TRALI *ID: D#[email or web address] of Unasyn. S/p ancef and azithromycin during her hysterectomy. S/p one day of zosyn prior to switching to Unasyn. Likely PNA.  *PT: following *PPx: SCDs   Cornelia Copaharlie Koren Sermersheim, Jr. MD Attending [facility](Faculty Practice) [employer] phone: [phone]

## 2018-11-25 NOTE — Progress Notes (Addendum)
NAME:  Makayla Fletcher, MRN:  [id number], DOB:  31-May-1996, LOS: 3 ADMISSION DATE:  11/22/2018, CONSULTATION DATE:  11/23/18 REFERRING MD:  Adrian Blackwater - OBGYN  CHIEF COMPLAINT:  Post-partum hemorrhagic shock   Brief History    23 yo F who on 6/3 underwent Vaginal delivery complicated by uterine inversion, retained placenta, post-partum hemorrhage with hemorrhagic shock, emergent hysterectomy. Underwent MTP receiving 5.4L crystalloid, 4 Cryo, 2 plt, 8 PRBC, 5 FFP. Intubated, transferring to MICU  History of present illness    23 yo F PMH depression, sickle cell anemia, migraine who on 6/3 underwent spontaneous vaginal delivery complicated by uterine inversion, retained placenta, post-partum hemorrhage with hemorrhagic shock, emergent hysterectomy. Underwent MTP receiving 5.4L crystalloid, 4 Cryo, 2 plt, 8 PRBC, 5 FFP. Required intubation. She was extubated 6/4  Past Medical History  Sickle cell anemia Depression Migraine  Significant Hospital Events    6/3 Vaginal delivery complicated by uterine inversion, retained placenta, post-partum hemorrhage with hemorrhagic shock, emergent hysterectomy. Remains intubated and being transferred to ICU   Consults:  OBGYN PCCM  Procedures:  6/3 hysterectomy 2/2 post-partum hemorrhagic shock 6/3 ETT >>> 6/4  Significant Diagnostic Tests:   Micro Data:   Antimicrobials:  Azithromycin 6/3>> Ancef 6/3   Interim history/subjective:   Extubated 6/4, now on 2 L [place] with saturations of 99%  Net negative 6500 cc's Needs aggressive pulmonary toilet Ambulating with PT  Objective   Blood pressure 118/87, pulse (!) 132, temperature 99.7 F (37.6 C), temperature source Oral, resp. rate (!) 24, height  (1.575 m), weight 69.8 kg, last menstrual period 02/21/2018, SpO2 99 %, unknown if currently breastfeeding.        Intake/Output Summary (Last 24 hours) at 11/25/2018 0956 Last data filed at 11/25/2018 0600 Gross per 24 hour  Intake 1640.02 ml   Output 7675 ml  Net -6034.98 ml   Filed Weights   11/23/18 1000 11/24/18 0400  Weight: 68.2 kg 69.8 kg    Examination: General: Awake and alert, ambulating around the unit HENT: [place]/AT, PERRL, EOM-I Lungs:Bilateral chest excursion, Clear throughout, No accessory muscle recruitment, synchronous with vent  Cardiovascular: RRR s1s2 no rgm. 1+ radial pulses, tachy per tele  Abdomen: Soft, distended. Tender to palpation, Abdominal incision dressing c/d/i without evidence of bleeding, splinting noted Extremities: Symmetrical bulk and tone.NO obvious deformites  Neuro: Awake and alert, MAE x 4, A&O x 3 Skin:Dry and intact, surgical  Incision appears clean  I reviewed CXR myself, Stable right basilar opacity is noted concerning for atelectasis or infiltrate  Resolved Hospital Problem list    Assessment & Plan:   Post-partum hemorrhagic shock s/p ex lap with hysterectomy S/p MTP with 7 PRNB 4 Cryo 2 Plt 5 FFP  P: MAP goal > 65. Currently HDS  Trend CBC daily Transfuse per unit protocol  Post-partum care per [facility] IVF No further diuresis today  Acute respiratory failure requiring intubation -airway protection for general anesthesia for ex lap, hysterectomy 2/2 hemorrhagic shock - extubated 6/4 - CXR with infiltrate vs atelectasis - High risk TRALI P On 2 L [place] Titrate oxygen for sats > 94% Aggressive Pulm hygiene  IS Q 1 while awake OOB to chair Trend CXR as patient is high risk for TRALI D/C Unasyn  Add Augmentin 875  BID x 7 days  Trend WBC Culture as is clinically indicated   Tachycardia Hypomag Plan Tele monitoring Replete with 2 grams 6/5 and trend mag in am    Best practice:  Diet:  NPO Pain/Anxiety/Delirium protocol (if indicated): Prop, PRN fent  VAP protocol (if indicated): yes DVT prophylaxis: SCD  GI prophylaxis: protonix  Glucose control: monitor Mobility: bedrest Code Status: Full  Family Communication: per OB Disposition: ICU   Labs    CBC: Recent Labs  Lab 11/24/18 0125 11/24/18 0732 11/24/18 1000 11/24/18 1350 11/24/18 1958 11/25/18 0453  WBC 10.7* 15.1* 15.9* 16.0*  --  19.6*  HGB 10.0* 9.8* 9.8* 10.0*  --  10.3*  HCT 27.7* 27.0* 27.4* 28.7*  --  29.7*  MCV 84.5 84.6 84.6 84.9  --  86.1  PLT 40* 30* 30* <30* 43* 39*    Basic Metabolic Panel: Recent Labs  Lab 11/23/18 1027 11/23/18 1034 11/24/18 0733 11/24/18 1350 11/25/18 0453  NA 141 141 140 142 138  K 4.8 4.8 3.7 4.2 4.1  CL 110  --  101 100 93*  CO2 20*  --  26 27 29   GLUCOSE 116*  --  81 78 123*  BUN 7  --  20 25* 35*  CREATININE 1.02*  --  2.74* 3.19* 4.15*  CALCIUM 7.1*  --  7.8* 8.2* 8.4*  MG  --   --  1.6*  --  1.7  PHOS  --   --  6.6*  --  7.5*   GFR: Estimated Creatinine Clearance: 19.1 mL/min (A) (by C-G formula based on SCr of 4.15 mg/dL (H)). Recent Labs  Lab 11/24/18 0732 11/24/18 1000 11/24/18 1350 11/25/18 0453  WBC 15.1* 15.9* 16.0* 19.6*    Liver Function Tests: Recent Labs  Lab 11/23/18 1027  AST 663*  ALT 349*  ALKPHOS 48  BILITOT 4.9*  PROT 3.4*  ALBUMIN 1.8*   No results for input(s): LIPASE, AMYLASE in the last 168 hours. No results for input(s): AMMONIA in the last 168 hours.  ABG    Component Value Date/Time   PHART 7.462 (H) 11/24/2018 0310   PCO2ART 38.3 11/24/2018 0310   PO2ART 74.6 (L) 11/24/2018 0310   HCO3 26.8 11/24/2018 0310   TCO2 23 11/23/2018 1034   ACIDBASEDEF 4.0 (H) 11/23/2018 1034   O2SAT 95.7 11/24/2018 0310     Coagulation Profile: Recent Labs  Lab 11/23/18 0640 11/23/18 1027 11/23/18 1820 11/24/18 1350  INR 2.0* 3.7* 2.0* 1.6*    Cardiac Enzymes: No results for input(s): CKTOTAL, CKMB, CKMBINDEX, TROPONINI in the last 168 hours.  HbA1C: Hgb A1c MFr Bld  Date/Time Value Ref Range Status  03/15/2014 02:34 PM 4.0 <5.7 % Final    Comment:                                                                           According to the ADA Clinical Practice Recommendations  for 2011, when HbA1c is used as a screening test:     >=6.5%   Diagnostic of Diabetes Mellitus            (if abnormal result is confirmed)   5.7-6.4%   Increased risk of developing Diabetes Mellitus   References:Diagnosis and Classification of Diabetes Mellitus,Diabetes Care,2011,34(Suppl 1):S62-S69 and Standards of Medical Care in         Diabetes - 2011,Diabetes Care,2011,34 (Suppl 1):S11-S61.   Result repeated and verified.  CBG: Recent Labs  Lab 11/24/18 0738 11/24/18 1133 11/24/18 1620 11/24/18 1957 11/25/18 0733  GLUCAP 76 75 67* 90 97   Bevelyn Ngo, AGACNP-BC [facility] Pulmonary/Critical Care Medicine Pager # [phone] 11/25/2018 10:28 AM  Attending Note:  23 year old female with sickle cell disease that had hemorrhage post C-section and was left intubated.  Extubated yesterday after massive transfusion protocol and appears well this AM on exam with clear lungs.  Responded very well to diureses overnight.  I reviewed CXR myself, RLL infiltrate noted.  Discussed with PCCM-NP and OB-MD.  Sinus tach:  - Check echo for ?cardiomyopathy, OB MD to follow  - Tele monitoring  - Avoid further diureses  Aspiration pneumonia:  - Change unasyn to augmentin  - Finish a 7 days course  - Stop date in place  Thrombocytopenia: dilutional, not bleeding  - Hold further transfusion  AKI: not pre-renal (BUN:Cr not 20:1), likely due to ATN from acute insult due to hypotension with hemorrhage.  - Hold further diureses  - BMET in AM  - If worsens may need consider renal involvement  May transfer out of the ICU to progressive care  PCCM will sign off, please call back if needed  Patient seen and examined, agree with above note.  I dictated the care and orders written for this patient under my direction.  Alyson Reedy, M.D. [facility] Pulmonary/Critical Care Medicine. Pager: [phone]. After hours pager: [phone]

## 2018-11-26 ENCOUNTER — Inpatient Hospital Stay (HOSPITAL_COMMUNITY): Payer: 59

## 2018-11-26 LAB — BPAM RBC
Blood Product Expiration Date: 202006112359
Blood Product Expiration Date: 202006172359
Blood Product Expiration Date: 202006202359
Blood Product Expiration Date: 202006202359
Blood Product Expiration Date: 202006212359
Blood Product Expiration Date: 202006242359
Blood Product Expiration Date: 202006242359
Blood Product Expiration Date: 202006252359
Blood Product Expiration Date: 202006252359
Blood Product Expiration Date: 202006262359
Blood Product Expiration Date: 202006272359
Blood Product Expiration Date: 202006272359
Blood Product Expiration Date: 202007012359
Blood Product Expiration Date: 202007012359
Blood Product Expiration Date: 202007012359
Blood Product Expiration Date: 202007012359
Blood Product Expiration Date: 202007012359
Blood Product Expiration Date: 202007012359
Blood Product Expiration Date: 202007032359
Blood Product Expiration Date: 202007032359
Blood Product Expiration Date: 202007032359
Blood Product Expiration Date: 202007032359
Blood Product Expiration Date: 202007032359
Blood Product Expiration Date: 202007072359
Blood Product Expiration Date: 202007072359
Blood Product Expiration Date: 202007072359
Blood Product Expiration Date: 202007072359
ISSUE DATE / TIME: 202005310959
ISSUE DATE / TIME: 202006030357
ISSUE DATE / TIME: 202006030357
ISSUE DATE / TIME: 202006030357
ISSUE DATE / TIME: 202006030357
ISSUE DATE / TIME: 202006030514
ISSUE DATE / TIME: 202006030514
ISSUE DATE / TIME: 202006030647
ISSUE DATE / TIME: 202006030647
ISSUE DATE / TIME: 202006030647
ISSUE DATE / TIME: 202006030647
ISSUE DATE / TIME: 202006030818
ISSUE DATE / TIME: 202006030818
ISSUE DATE / TIME: 202006031946
ISSUE DATE / TIME: 202006031946
ISSUE DATE / TIME: 202006031946
ISSUE DATE / TIME: 202006031946
ISSUE DATE / TIME: 202006032330
ISSUE DATE / TIME: 202006040021
ISSUE DATE / TIME: 202006051145
ISSUE DATE / TIME: 202006051717
Unit Type and Rh: 5100
Unit Type and Rh: 5100
Unit Type and Rh: 5100
Unit Type and Rh: 5100
Unit Type and Rh: 5100
Unit Type and Rh: 5100
Unit Type and Rh: 5100
Unit Type and Rh: 5100
Unit Type and Rh: 5100
Unit Type and Rh: 5100
Unit Type and Rh: 5100
Unit Type and Rh: 5100
Unit Type and Rh: 5100
Unit Type and Rh: 5100
Unit Type and Rh: 5100
Unit Type and Rh: 5100
Unit Type and Rh: 5100
Unit Type and Rh: 5100
Unit Type and Rh: 5100
Unit Type and Rh: 5100
Unit Type and Rh: 5100
Unit Type and Rh: 5100
Unit Type and Rh: 5100
Unit Type and Rh: 5100
Unit Type and Rh: 5100
Unit Type and Rh: 5100
Unit Type and Rh: 9500

## 2018-11-26 LAB — TYPE AND SCREEN
ABO/RH(D): O POS
Antibody Screen: NEGATIVE
Donor AG Type: NEGATIVE
Donor AG Type: NEGATIVE
Donor AG Type: NEGATIVE
Donor AG Type: NEGATIVE
Donor AG Type: NEGATIVE
Donor AG Type: NEGATIVE
Donor AG Type: NEGATIVE
Donor AG Type: NEGATIVE
Donor AG Type: NEGATIVE
Donor AG Type: NEGATIVE
Donor AG Type: NEGATIVE
Donor AG Type: NEGATIVE
Donor AG Type: NEGATIVE
Donor AG Type: NEGATIVE
Donor AG Type: NEGATIVE
Unit division: 0
Unit division: 0
Unit division: 0
Unit division: 0
Unit division: 0
Unit division: 0
Unit division: 0
Unit division: 0
Unit division: 0
Unit division: 0
Unit division: 0
Unit division: 0
Unit division: 0
Unit division: 0
Unit division: 0
Unit division: 0
Unit division: 0
Unit division: 0
Unit division: 0
Unit division: 0
Unit division: 0
Unit division: 0
Unit division: 0
Unit division: 0
Unit division: 0
Unit division: 0
Unit division: 0

## 2018-11-26 LAB — PREPARE FRESH FROZEN PLASMA
Unit division: 0
Unit division: 0
Unit division: 0

## 2018-11-26 LAB — BPAM FFP
Blood Product Expiration Date: 202006082359
Blood Product Expiration Date: 202006082359
Blood Product Expiration Date: 202006082359
ISSUE DATE / TIME: 202006031953
ISSUE DATE / TIME: 202006031953
ISSUE DATE / TIME: 202006031953
Unit Type and Rh: 8400
Unit Type and Rh: 8400
Unit Type and Rh: 8400

## 2018-11-26 LAB — CBC
HCT: 30.1 % — ABNORMAL LOW (ref 36.0–46.0)
Hemoglobin: 10.3 g/dL — ABNORMAL LOW (ref 12.0–15.0)
MCH: 30.1 pg (ref 26.0–34.0)
MCHC: 34.2 g/dL (ref 30.0–36.0)
MCV: 88 fL (ref 80.0–100.0)
Platelets: 35 10*3/uL — ABNORMAL LOW (ref 150–400)
RBC: 3.42 MIL/uL — ABNORMAL LOW (ref 3.87–5.11)
RDW: 15 % (ref 11.5–15.5)
WBC: 21 10*3/uL — ABNORMAL HIGH (ref 4.0–10.5)
nRBC: 3.9 % — ABNORMAL HIGH (ref 0.0–0.2)

## 2018-11-26 LAB — HEPATIC FUNCTION PANEL
ALT: 775 U/L — ABNORMAL HIGH (ref 0–44)
AST: 1091 U/L — ABNORMAL HIGH (ref 15–41)
Albumin: 2.6 g/dL — ABNORMAL LOW (ref 3.5–5.0)
Alkaline Phosphatase: 107 U/L (ref 38–126)
Bilirubin, Direct: 1.4 mg/dL — ABNORMAL HIGH (ref 0.0–0.2)
Indirect Bilirubin: 1.8 mg/dL — ABNORMAL HIGH (ref 0.3–0.9)
Total Bilirubin: 3.2 mg/dL — ABNORMAL HIGH (ref 0.3–1.2)
Total Protein: 5.8 g/dL — ABNORMAL LOW (ref 6.5–8.1)

## 2018-11-26 LAB — BASIC METABOLIC PANEL
Anion gap: 12 (ref 5–15)
BUN: 39 mg/dL — ABNORMAL HIGH (ref 6–20)
CO2: 25 mmol/L (ref 22–32)
Calcium: 8.5 mg/dL — ABNORMAL LOW (ref 8.9–10.3)
Chloride: 99 mmol/L (ref 98–111)
Creatinine, Ser: 3.06 mg/dL — ABNORMAL HIGH (ref 0.44–1.00)
GFR calc Af Amer: 24 mL/min — ABNORMAL LOW (ref >60)
GFR calc non Af Amer: 20 mL/min — ABNORMAL LOW (ref >60)
Glucose, Bld: 108 mg/dL — ABNORMAL HIGH (ref 70–99)
Potassium: 4.4 mmol/L (ref 3.5–5.1)
Sodium: 136 mmol/L (ref 135–145)

## 2018-11-26 LAB — TRIGLYCERIDES: Triglycerides: 301 mg/dL — ABNORMAL HIGH (ref <150)

## 2018-11-26 LAB — MAGNESIUM: Magnesium: 2.2 mg/dL (ref 1.7–2.4)

## 2018-11-26 MED ORDER — CHLORHEXIDINE GLUCONATE CLOTH 2 % EX PADS
6.0000 | MEDICATED_PAD | Freq: Every day | CUTANEOUS | Status: DC
  Administered 2018-11-26 – 2018-11-29 (×4): 6 via TOPICAL

## 2018-11-26 MED ORDER — SIMETHICONE 80 MG PO CHEW
80.0000 mg | CHEWABLE_TABLET | Freq: Three times a day (TID) | ORAL | Status: DC
  Administered 2018-11-26 – 2018-12-01 (×15): 80 mg via ORAL
  Filled 2018-11-26 (×15): qty 1

## 2018-11-26 MED ORDER — POLYETHYLENE GLYCOL 3350 17 G PO PACK
17.0000 g | PACK | Freq: Every day | ORAL | Status: DC
  Administered 2018-11-26 – 2018-12-01 (×6): 17 g via ORAL
  Filled 2018-11-26 (×7): qty 1

## 2018-11-26 NOTE — Progress Notes (Signed)
Report given to [facility] nurse. Transporting patient to 2c14 via wheelchair with hospital breast pump, K-pad, scd's, and cell phone and charger. Patient pivoted to bed from wheelchair. Hooked up to monitor. PCA dilaudid button given to patient.

## 2018-11-26 NOTE — Progress Notes (Signed)
Charge nurse on [place] to call back for report.

## 2018-11-26 NOTE — Progress Notes (Signed)
Obstetrics Daily Progress Note  Admission Date: 11/22/2018 Current Date: 11/26/2018 8:30 AM  Makayla Fletcher is a 23 y.o. [id number] POD#3 EUA, supracervical hysterectomy,cysto for [facility] @ 39wks, admitted for hemoglobin [place] IOL  Pregnancy complicated by: Patient Active Problem List   Diagnosis Date Noted  . AKI (acute kidney injury) (HCC) 11/23/2018  . Abnormal transaminases 11/23/2018  . Sickle cell disease (HCC) 11/22/2018  . Encounter for induction of labor 11/22/2018  . Maternal sickle cell anemia affecting pregnancy, antepartum (HCC) 05/11/2018  . Supervision of normal first pregnancy, antepartum 05/04/2018  . Vitamin D deficiency 06/06/2015  . Migraine without aura 12/18/2013  . 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 Sickle cell anemia (HCC) 06/17/2012  . Depression, major, single episode, moderate (HCC) 07/31/2011  . Generalized anxiety disorder 07/31/2011    Overnight/24hr events:  None  Subjective:  No nausea, fevers, chills, chest pain, sob. Pain controlled with PCA. Taking liquids great. Afraid to take more.   Objective:    Current Vital Signs 24h Vital Sign Ranges  T 98.8 F (37.1 C) Temp  Avg: 98.5 F (36.9 C)  Min: 97.6 F (36.4 C)  Max: 98.9 F (37.2 C)  BP (!) 125/105 BP  Min: 113/90  Max: 132/85  HR 97 Pulse  Avg: 115.3  Min: 97  Max: 136  RR (!) 21 Resp  Avg: 23  Min: 17  Max: 37  SaO2 100 % Nasal Cannula SpO2  Avg: 99.5 %  Min: 97 %  Max: 100 %       24 Hour I/O Current Shift I/O  Time Ins Outs 06/05 0701 - 06/06 0700 In: 4368.4 [P.O.:2160; I.V.:2158.4] Out: 4100 [Urine:4100] 06/06 0701 - 06/06 1900 In: 149.9 [I.V.:149.9] Out: -    Patient Vitals for the past 24 hrs:  BP Temp Temp src Pulse Resp SpO2  11/26/18 0802 - - - - (!) 21 100 %  11/26/18 0800 (!) 125/105 - - 97 (!) 22 100 %  11/26/18 0700 (!) 131/100 - - (!) 105 (!) 23 100 %  11/26/18 0600 (!) 116/93 - - (!) 105 20 100 %  11/26/18 0500 (!) 127/99 - - (!) 107 17  99 %  11/26/18 0400 132/85 - - (!) 106 (!) 23 100 %  11/26/18 0306 - - - - (!) 22 100 %  11/26/18 0300 (!) 124/93 - - (!) 105 20 100 %  11/26/18 0200 (!) 123/94 - - (!) 105 (!) 21 100 %  11/26/18 0100 (!) 132/99 - - (!) 105 17 100 %  11/26/18 0000 (!) 125/94 - - (!) 108 19 99 %  11/25/18 2343 (!) 118/98 98.8 F (37.1 C) Oral (!) 113 (!) 24 100 %  11/25/18 2304 - - - - (!) 23 99 %  11/25/18 2300 (!) 118/98 - - (!) 110 (!) 23 99 %  11/25/18 2200 (!) 119/93 - - (!) 114 (!) 24 99 %  11/25/18 2100 (!) 124/93 - - (!) 112 (!) 37 100 %  11/25/18 2002 - - - - 20 100 %  11/25/18 2001 125/90 97.6 F (36.4 C) Oral (!) 108 (!) 23 100 %  11/25/18 2000 125/90 - - (!) 108 (!) 27 100 %  11/25/18 1900 115/80 - - (!) 113 (!) 22 100 %  11/25/18 1800 (!) 119/94 - - (!) 120 (!) 31 99 %  11/25/18 1700 113/90 - - (!) 118 (!) 24 100 %  11/25/18 1635 - - - - (!)  26 100 %  11/25/18 1600 (!) 115/92 98.6 F (37 C) Oral (!) 123 (!) 26 99 %  11/25/18 1500 114/82 - - (!) 125 18 100 %  11/25/18 1400 122/88 - - (!) 128 19 99 %  11/25/18 1300 127/88 - - (!) 129 (!) 29 99 %  11/25/18 1200 (!) 118/92 98.9 F (37.2 C) Oral (!) 130 18 99 %  11/25/18 1100 (!) 121/92 - - (!) 136 (!) 26 99 %  11/25/18 1000 113/90 - - (!) 133 (!) 26 97 %  11/25/18 0900 122/82 - - (!) 134 (!) 21 98 %   UOP: >2-37200mL/hr  Physical exam: General: NAD, watching TV Abdomen: +BS (rare), unchanged distension (moderate), appropriately ttp. C/d/i with old blood on it GU: no gross VB. Foley in place UOP just emptied  Medications: Current Facility-Administered Medications  Medication Dose Route Frequency Provider Last Rate Last Dose  . 0.9 %  sodium chloride infusion   Intravenous Continuous Annie SableGoldsborough, Kellie, MD 150 mL/hr at 11/26/18 0800    . amoxicillin-clavulanate (AUGMENTIN) 875-125 MG per tablet 1 tablet  1 tablet Oral Q12H [place] BingPickens, Freddrick Gladson, MD   1 tablet at 11/25/18 2143  . Chlorhexidine Gluconate Cloth 2 % PADS 6 each  6 each  Topical Q0600 Levie HeritageStinson, Jacob J, DO      . coconut oil  1 application Topical PRN [place] BingPickens, Jeny Nield, MD      . witch hazel-glycerin (TUCKS) pad 1 application  1 application Topical PRN [place] BingPickens, Karon Cotterill, MD   1 application at 11/25/18 2300   And  . dibucaine (NUPERCAINAL) 1 % rectal ointment 1 application  1 application Rectal PRN [place] BingPickens, Shila Kruczek, MD      . diphenhydrAMINE (BENADRYL) injection 12.5 mg  12.5 mg Intravenous Q6H PRN [place] BingPickens, Ziare Orrick, MD       Or  . diphenhydrAMINE (BENADRYL) 12.5 MG/5ML elixir 12.5 mg  12.5 mg Oral Q6H PRN [place] BingPickens, Raelyn Racette, MD   12.5 mg at 11/25/18 0830  . HYDROmorphone (DILAUDID) 1 mg/mL PCA injection   Intravenous Q4H PRN [place] BingPickens, Ronnette Rump, MD      . LORazepam (ATIVAN) injection 1 mg  1 mg Intravenous Q6H PRN [place] BingPickens, Teckla Christiansen, MD   1 mg at 11/24/18 2147  . MEDLINE mouth rinse  15 mL Mouth Rinse BID [place] BingPickens, Maddelynn Moosman, MD   15 mL at 11/24/18 2156  . naloxone [facility](NARCAN) injection 0.4 mg  0.4 mg Intravenous PRN [place] BingPickens, Edris Friedt, MD       And  . sodium chloride flush (NS) 0.9 % injection 9 mL  9 mL Intravenous PRN [place] BingPickens, Lajuanda Penick, MD      . ondansetron (ZOFRAN) injection 4 mg  4 mg Intravenous Q6H PRN [place] BingPickens, Marsha Hillman, MD   4 mg at 11/26/18 0400  . simethicone (MYLICON) chewable tablet 80 mg  80 mg Oral TID [place] BingPickens, Gentri Guardado, MD        Labs:  Recent Labs  Lab 11/24/18 1350 11/24/18 1958 11/25/18 0453 11/26/18 0512  WBC 16.0*  --  19.6* 21.0*  HGB 10.0*  --  10.3* 10.3*  HCT 28.7*  --  29.7* 30.1*  PLT 27* 43* 39* 35*    Recent Labs  Lab 11/23/18 1027  11/24/18 1350 11/25/18 0453 11/26/18 0512  NA 141   < > 142 138 136  K 4.8   < > 4.2 4.1 4.4  CL 110   < > 100 93* 99  CO2 20*   < > 27 29 25   BUN 7   < >  25* 35* 39*  CREATININE 1.02*   < > 3.19* 4.15* 3.06*  CALCIUM 7.1*   < > 8.2* 8.4* 8.5*  PROT 3.4*  --   --  6.3* 5.8*  BILITOT 4.9*  --   --  2.5* 3.2*  ALKPHOS 48  --   --  104 107  ALT 349*  --   --  1,073* 775*  AST 663*  --   --  2,714*  1,091*  GLUCOSE 116*   < > 78 123* 108*   < > = values in this interval not displayed.    Radiology:  ECHOCARDIOGRAM REPORT       Patient Name:   JENNIFFER VESSELS Date of Exam: 11/25/2018 Medical Rec #:  [id number]         Height:       62.0 in Accession #:    [id number]        Weight:       153.9 lb Date of Birth:  11-01-1995         BSA:          1.71 m Patient Age:    22 years          BP:           122/88 mmHg Patient Gender: F                 HR:           124 bpm. Exam Location:  Inpatient    Procedure: 2D Echo, Cardiac Doppler and Color Doppler  Indications:    I51.7 Cardiomegaly   History:        Patient has prior history of Echocardiogram examinations, most                 recent 03/29/2014. Sickle Cell Anemia.   Sonographer:    Jonelle Sidle Dance Referring Phys: [place]    1. The left ventricle has normal systolic function, with an ejection fraction of 55-60%. The cavity size was normal. Indeterminate diastolic filling due to E-A fusion.  2. Trivial pericardial effusion is present.  3. The mitral valve is abnormal. Mild thickening of the mitral valve leaflet. There is moderate mitral annular calcification present.  4. The tricuspid valve is grossly normal.  5. The aortic root is normal in size and structure.  6. The interatrial septum was not assessed.  FINDINGS  Left Ventricle: The left ventricle has normal systolic function, with an ejection fraction of 55-60%. The cavity size was normal. There is no increase in left ventricular wall thickness. Indeterminate diastolic filling due to E-A fusion.  Right Ventricle: The right ventricle has normal systolic function. The cavity was normal. There is right vetricular wall thickness was not assessed.  Left Atrium: Left atrial size was normal in size.  Right Atrium: Right atrial size was normal in size. Right atrial pressure is estimated at 3 mmHg.  Interatrial Septum: The interatrial  septum was not assessed.  Pericardium: Trivial pericardial effusion is present.  Mitral Valve: The mitral valve is abnormal. Mild thickening of the mitral valve leaflet. There is moderate mitral annular calcification present. Mitral valve regurgitation is trivial by color flow Doppler.  Tricuspid Valve: The tricuspid valve is grossly normal. Tricuspid valve regurgitation was not visualized by color flow Doppler.  Aortic Valve: The aortic valve is normal in structure. Aortic valve regurgitation was not visualized by color flow Doppler.  Pulmonic Valve: The pulmonic valve was grossly  normal. Pulmonic valve regurgitation is not visualized by color flow Doppler.  Aorta: The aortic root is normal in size and structure.  Venous: The inferior vena cava is normal in size with greater than 50% respiratory variability.    +--------------+--------++ LEFT VENTRICLE         +----------------+----------++ +--------------+--------++ Diastology                 PLAX 2D                +----------------+----------++ +--------------+--------++ LV e' lateral:  11.20 cm/s LVIDd:        4.55 cm  +----------------+----------++ +--------------+--------++ LV E/e' lateral:7.9        LVIDs:        3.35 cm  +----------------+----------++ +--------------+--------++ LV e' medial:   7.83 cm/s  LV PW:        1.04 cm  +----------------+----------++ +--------------+--------++ LV E/e' medial: 11.4       LV IVS:       0.65 cm  +----------------+----------++ +--------------+--------++ LVOT diam:    2.10 cm  +--------------+--------++ LV SV:        49 ml    +--------------+--------++ LV SV Index:  27.77    +--------------+--------++ LVOT Area:    3.46 cm +--------------+--------++                        +--------------+--------++  +---------------+----------++ RIGHT VENTRICLE           +---------------+----------++ RV Basal diam:  2.53 cm    +---------------+----------++ RV S prime:    16.40 cm/s +---------------+----------++ TAPSE (M-mode):2.3 cm     +---------------+----------++  +---------------+-------++-----------++ LEFT ATRIUM           Index       +---------------+-------++-----------++ LA diam:       2.80 cm1.64 cm/m  +---------------+-------++-----------++ LA Vol (A2C):  49.0 ml28.65 ml/m +---------------+-------++-----------++ LA Vol (A4C):  37.9 ml22.16 ml/m +---------------+-------++-----------++ LA Biplane Vol:43.0 ml25.14 ml/m +---------------+-------++-----------++ +------------+---------++-----------++ RIGHT ATRIUM         Index       +------------+---------++-----------++ RA Area:    14.90 cm            +------------+---------++-----------++ RA Volume:  37.20 ml 21.75 ml/m +------------+---------++-----------++  +------------+-----------++ AORTIC VALVE            +------------+-----------++ LVOT Vmax:  79.10 cm/s  +------------+-----------++ LVOT Vmean: 41.800 cm/s +------------+-----------++ LVOT VTI:   0.107 m     +------------+-----------++   +-------------+-------++ AORTA                +-------------+-------++ Ao Root diam:2.90 cm +-------------+-------++ Ao Asc diam: 2.30 cm +-------------+-------++  +--------------+----------++ MITRAL VALVE             +--------------+-------+ +--------------+----------++ SHUNTS                MV Area (PHT):6.63 cm   +--------------+-------+ +--------------+----------++ Systemic VTI: 0.11 m  MV PHT:       33.20 msec +--------------+-------+ +--------------+----------++ Systemic Diam:2.10 cm MV Decel Time:115 msec   +--------------+-------+ +--------------+----------++ +--------------+----------++ MV E velocity:89.00 cm/s +--------------+----------++    Dietrich PatesPaula Ross MD Electronically signed by Dietrich PatesPaula Ross  MD Signature Date/Time: 11/25/2018/7:48:22 PM  Assessment & Plan:  Patient stable *Postpartum: lactation seeing patient *Postop: Continue dilaudid PCA until more PO. Continue foley *Heme: Stable CBC. No e/o active bleeding *Renal: Most likely due to PP hemorrhage, hypovolemia. Cr improving. Renal following *CV: continue tele. Tachycardia improving.  -normal echo yesterday *FEN/GI: continue  full liquid. Continue to watch for ileus. Will add simethicone -6/4: Patient passed swallow study *Pulmonary: can call CCM with any questions -ET removed on 6/4. CCM signed off. Continue to watch for TRALI *ID: D#[id number] of Augmentin for likely PNA. S/p ancef and azithromycin during her hysterectomy. S/p one day of zosyn prior to switching to Unasyn. S/p two days of Unasyn.  *PT: following *PPx: SCDs   Cornelia Copaharlie Malaina Mortellaro, Jr. MD Attending Center for [facility](Faculty Practice) [employer] phone: [phone]

## 2018-11-26 NOTE — Progress Notes (Signed)
30ml's of dilaudid wasted in sink with Marcha Solders, RN.

## 2018-11-26 NOTE — Progress Notes (Signed)
Patient pumped each breast for 15 minutes using the pump on rollers at bedside. Only produced drops in breast shield, not enough to go in bottle. Shields cleaned and ready for next use.

## 2018-11-26 NOTE — Progress Notes (Signed)
Patient pumped both breasts for 15 minutes. Only produced drops in both shields. Shields cleaned and ready for next use.

## 2018-11-26 NOTE — Progress Notes (Signed)
Patient did not want to pump right now. States that she feels like shes going to "puke.". Zofran given. Will continue to monitor.

## 2018-11-26 NOTE — Progress Notes (Signed)
Subjective:  Stable overnight-  4100 of UOP but also 4300 in - crt down - LFT's down as well  Objective Vital signs in last 24 hours: Vitals:   11/26/18 0700 11/26/18 0749 11/26/18 0800 11/26/18 0802  BP: (!) 131/100  (!) 125/105   Pulse: (!) 105  97   Resp: (!) 23  (!) 22 (!) 21  Temp:  97.7 F (36.5 C)    TempSrc:  Oral    SpO2: 100%  100% 100%  Weight:      Height:       Weight change:   Intake/Output Summary (Last 24 hours) at 11/26/2018 0851 Last data filed at 11/26/2018 0800 Gross per 24 hour  Intake 4278.34 ml  Output 3800 ml  Net 478.34 ml    Assessment/Plan: 23 year old BF with sickle cell s/p pregnancy with complication of retained placenta and uterine inversion, bleed and hemodynamic instability requiring urgent surgery and resuscitation - has developed AKI 1.Renal- AKI in the setting of hemodynamic instability/emergency surgery for ABLA and also IV contrast-  Having a post injury diuresis.  It feels like ATN due to reduced renal perfusion at the time of crisis.  The fact that she is having a post injury diuresis is a good sign.    Will check a U/A to look for other causes of AKI- no protein- blood not surprising in non cathed spec. I feel very optomistic that this will turn around- has started to today.   Need to make sure are keeping up with urinary losses- have ordered IVF to continue for one more day   2. Hypertension/volume  - making lots of urine- post injury diuresis- no more lasix and need to keep up with losses with IVF- have started at 150 per hour- she is taking in Pos but cannot keep up just yet- maybe after today she will be able to  4. Anemia  - ABLA due to events- recovered after transfusions.  If has sickle cell likely her baseline hgb is low- stable last 48 hours  5, Elevated LFT's - also due to shock.  Need to follow for correction - better as well    Louis Meckel    Labs: Basic Metabolic Panel: Recent Labs  Lab 11/24/18 0733 11/24/18 1350  11/25/18 0453 11/26/18 0512  NA 140 142 138 136  K 3.7 4.2 4.1 4.4  CL 101 100 93* 99  CO2 26 27 29 25   GLUCOSE 81 78 123* 108*  BUN 20 25* 35* 39*  CREATININE 2.74* 3.19* 4.15* 3.06*  CALCIUM 7.8* 8.2* 8.4* 8.5*  PHOS 6.6*  --  7.5*  --    Liver Function Tests: Recent Labs  Lab 11/23/18 1027 11/25/18 0453 11/26/18 0512  AST 663* 2,714* 1,091*  ALT 349* 1,073* 775*  ALKPHOS 48 104 107  BILITOT 4.9* 2.5* 3.2*  PROT 3.4* 6.3* 5.8*  ALBUMIN 1.8* 2.9* 2.6*   No results for input(s): LIPASE, AMYLASE in the last 168 hours. No results for input(s): AMMONIA in the last 168 hours. CBC: Recent Labs  Lab 11/24/18 0732 11/24/18 1000 11/24/18 1350 11/24/18 1958 11/25/18 0453 11/26/18 0512  WBC 15.1* 15.9* 16.0*  --  19.6* 21.0*  HGB 9.8* 9.8* 10.0*  --  10.3* 10.3*  HCT 27.0* 27.4* 28.7*  --  29.7* 30.1*  MCV 84.6 84.6 84.9  --  86.1 88.0  PLT 30* 30* 27* 43* 39* 35*   Cardiac Enzymes: No results for input(s): CKTOTAL, CKMB, CKMBINDEX, TROPONINI in  the last 168 hours. CBG: Recent Labs  Lab 11/24/18 1620 11/24/18 1957 11/25/18 0733 11/25/18 1207 11/25/18 1555  GLUCAP 67* 90 97 90 100*    Iron Studies: No results for input(s): IRON, TIBC, TRANSFERRIN, FERRITIN in the last 72 hours. Studies/Results: Dg Chest Port 1 View  Result Date: 11/26/2018 CLINICAL DATA:  Respiratory failure. EXAM: PORTABLE CHEST 1 VIEW COMPARISON:  Chest radiograph 01/25/2019 FINDINGS: Monitoring leads overlie the patient. Stable cardiomegaly. Left greater than right mid and lower lung airspace opacities. Possible small left pleural effusion. No pneumothorax. IMPRESSION: Similar-appearing basilar opacities which may represent atelectasis or infection. Electronically Signed   By: Annia Beltrew  Davis M.D.   On: 11/26/2018 08:05   Dg Chest Port 1 View  Result Date: 11/25/2018 CLINICAL DATA:  Acute pulmonary edema. EXAM: PORTABLE CHEST 1 VIEW COMPARISON:  Radiograph of November 24, 2018. FINDINGS: Stable  cardiomediastinal silhouette. No pneumothorax is noted. Endotracheal and nasogastric tubes have been removed. Left lung is clear. Stable right basilar opacity is noted concerning for atelectasis or infiltrate. Bony thorax is unremarkable. IMPRESSION: Endotracheal and nasogastric tubes have been removed. Stable right basilar opacity is noted concerning for atelectasis or infiltrate. Electronically Signed   By: Lupita RaiderJames  Green Jr M.D.   On: 11/25/2018 07:05   Medications: Infusions: . sodium chloride 150 mL/hr at 11/26/18 0800    Scheduled Medications: . amoxicillin-clavulanate  1 tablet Oral Q12H  . Chlorhexidine Gluconate Cloth  6 each Topical Q0600  . mouth rinse  15 mL Mouth Rinse BID  . polyethylene glycol  17 g Oral Daily  . simethicone  80 mg Oral TID    have reviewed scheduled and prn medications.  Physical Exam: General: a little nauseated this AM Heart: tachy but better Lungs: mostly clear Abdomen: distended  Extremities: min edema    11/26/2018,8:51 AM  LOS: 4 days

## 2018-11-27 LAB — RENAL FUNCTION PANEL
Albumin: 2.6 g/dL — ABNORMAL LOW (ref 3.5–5.0)
Anion gap: 9 (ref 5–15)
BUN: 38 mg/dL — ABNORMAL HIGH (ref 6–20)
CO2: 24 mmol/L (ref 22–32)
Calcium: 8.5 mg/dL — ABNORMAL LOW (ref 8.9–10.3)
Chloride: 105 mmol/L (ref 98–111)
Creatinine, Ser: 2.19 mg/dL — ABNORMAL HIGH (ref 0.44–1.00)
GFR calc Af Amer: 35 mL/min — ABNORMAL LOW (ref >60)
GFR calc non Af Amer: 31 mL/min — ABNORMAL LOW (ref >60)
Glucose, Bld: 94 mg/dL (ref 70–99)
Phosphorus: 4.4 mg/dL (ref 2.5–4.6)
Potassium: 4.8 mmol/L (ref 3.5–5.1)
Sodium: 138 mmol/L (ref 135–145)

## 2018-11-27 LAB — CBC
HCT: 33.6 % — ABNORMAL LOW (ref 36.0–46.0)
Hemoglobin: 11.4 g/dL — ABNORMAL LOW (ref 12.0–15.0)
MCH: 29.9 pg (ref 26.0–34.0)
MCHC: 33.9 g/dL (ref 30.0–36.0)
MCV: 88.2 fL (ref 80.0–100.0)
Platelets: 52 10*3/uL — ABNORMAL LOW (ref 150–400)
RBC: 3.81 MIL/uL — ABNORMAL LOW (ref 3.87–5.11)
RDW: 14.6 % (ref 11.5–15.5)
WBC: 18.7 10*3/uL — ABNORMAL HIGH (ref 4.0–10.5)
nRBC: 2.5 % — ABNORMAL HIGH (ref 0.0–0.2)

## 2018-11-27 LAB — TRIGLYCERIDES: Triglycerides: 276 mg/dL — ABNORMAL HIGH (ref <150)

## 2018-11-27 NOTE — Addendum Note (Signed)
Addendum  created 11/27/18 0700 by Effie Berkshire, MD   Order list changed

## 2018-11-27 NOTE — Progress Notes (Signed)
Subjective:  Stable overnight-  Moved to 2300- UOP not recorded but seems adequate - crt down  Objective Vital signs in last 24 hours: Vitals:   11/27/18 0300 11/27/18 0400 11/27/18 0500 11/27/18 0600  BP: (!) 133/99 (!) 130/97 (!) 124/99 (!) 122/96  Pulse: (!) 119   (!) 113  Resp: 18 18 14 18   Temp: 97.8 F (36.6 C)     TempSrc: Oral     SpO2: 95%   97%  Weight:      Height:       Weight change:   Intake/Output Summary (Last 24 hours) at 11/27/2018 8250 Last data filed at 11/27/2018 0600 Gross per 24 hour  Intake 4154.54 ml  Output 425 ml  Net 3729.54 ml    Assessment/Plan: 23 year old BF with sickle cell s/p pregnancy with complication of retained placenta and uterine inversion, bleed and hemodynamic instability requiring urgent surgery and resuscitation - has developed AKI 1.Renal- AKI in the setting of hemodynamic instability/emergency surgery for ABLA and also IV contrast-  Having a post injury diuresis.  It feels like ATN due to reduced renal perfusion at the time of crisis.  I feel very optomistic that this will turn around- has trended down now for 2 days.   Will stop IVF 2. Hypertension/volume  - making lots of urine- post injury diuresis- initially needed keep up with losses with IVF- will stop IVF today  4. Anemia  - ABLA due to events- recovered after transfusions.  If has sickle cell likely her baseline hgb is low- stable last 72 hours  5, Elevated LFT's - also due to shock.  Need to follow for correction - better as well   Will stop IVF and renal will sign off- anticipate continued recovery - no renal follow up needed    [place]: Basic Metabolic Panel: Recent Labs  Lab 11/24/18 0733  11/25/18 0453 11/26/18 0512 11/27/18 0210  NA 140   < > 138 136 138  K 3.7   < > 4.1 4.4 4.8  CL 101   < > 93* 99 105  CO2 26   < > 29 25 24   GLUCOSE 81   < > 123* 108* 94  BUN 20   < > 35* 39* 38*  CREATININE 2.74*   < > 4.15* 3.06* 2.19*  CALCIUM 7.8*    < > 8.4* 8.5* 8.5*  PHOS 6.6*  --  7.5*  --  4.4   < > = values in this interval not displayed.   Liver Function Tests: Recent Labs  Lab 11/23/18 1027 11/25/18 0453 11/26/18 0512 11/27/18 0210  AST 663* 2,714* 1,091*  --   ALT 349* 1,073* 775*  --   ALKPHOS 48 104 107  --   BILITOT 4.9* 2.5* 3.2*  --   PROT 3.4* 6.3* 5.8*  --   ALBUMIN 1.8* 2.9* 2.6* 2.6*   No results for input(s): LIPASE, AMYLASE in the last 168 hours. No results for input(s): AMMONIA in the last 168 hours. CBC: Recent Labs  Lab 11/24/18 0732 11/24/18 1000 11/24/18 1350 11/24/18 1958 11/25/18 0453 11/26/18 0512  WBC 15.1* 15.9* 16.0*  --  19.6* 21.0*  HGB 9.8* 9.8* 10.0*  --  10.3* 10.3*  HCT 27.0* 27.4* 28.7*  --  29.7* 30.1*  MCV 84.6 84.6 84.9  --  86.1 88.0  PLT 30* 30* 27* 43* 39* 35*   Cardiac Enzymes: No results for input(s): CKTOTAL, CKMB, CKMBINDEX,  TROPONINI in the last 168 hours. CBG: Recent Labs  Lab 11/24/18 1620 11/24/18 1957 11/25/18 0733 11/25/18 1207 11/25/18 1555  GLUCAP 67* 90 97 90 100*    Iron Studies: No results for input(s): IRON, TIBC, TRANSFERRIN, FERRITIN in the last 72 hours. Studies/Results: Dg Chest Port 1 View  Result Date: 11/26/2018 CLINICAL DATA:  Respiratory failure. EXAM: PORTABLE CHEST 1 VIEW COMPARISON:  Chest radiograph 01/25/2019 FINDINGS: Monitoring leads overlie the patient. Stable cardiomegaly. Left greater than right mid and lower lung airspace opacities. Possible small left pleural effusion. No pneumothorax. IMPRESSION: Similar-appearing basilar opacities which may represent atelectasis or infection. Electronically Signed   By: Annia Beltrew  Davis M.D.   On: 11/26/2018 08:05   Medications: Infusions: . sodium chloride 150 mL/hr at 11/26/18 2300    Scheduled Medications: . amoxicillin-clavulanate  1 tablet Oral Q12H  . Chlorhexidine Gluconate Cloth  6 each Topical Q0600  . mouth rinse  15 mL Mouth Rinse BID  . polyethylene glycol  17 g Oral Daily  .  simethicone  80 mg Oral TID    have reviewed scheduled and prn medications.  Physical Exam: General: looks brighter  Heart: tachy but better Lungs: mostly clear Abdomen: distended  Extremities: min edema    11/27/2018,7:22 AM  LOS: 5 days

## 2018-11-27 NOTE — Progress Notes (Signed)
Pt's stable throughout the day. PCA pump continue, her demands to pain medicine still high, getting out of bed using bed side commode and walking in the room, talking to the family member over the phone. Her surgical stiches of abdomen are open to air, epidural catheter still in place, she is bleeding scant through vagina, tucks are using frequently, she is encouraged to use breast pump and she did once, milk is saved in a ice bin till her family member got here to pick up. No any other specific complain, appetite fair, drinking good, urine output good, will continue to monitor the patient  Palma Holter, RN

## 2018-11-27 NOTE — Progress Notes (Signed)
Subjective:mild pain but improved Postpartum Day 4: EUA and [place], cystoscopy Patient reports incisional pain, tolerating PO, + flatus and no problems voiding.    Objective: Vital signs in last 24 hours: Temp:  [97.7 F (36.5 C)-98.2 F (36.8 C)] 98 F (36.7 C) (06/07 0800) Pulse Rate:  [96-119] 112 (06/07 0900) Resp:  [14-21] 21 (06/07 0900) BP: (121-141)/(96-108) 138/104 (06/07 0800) SpO2:  [93 %-100 %] 93 % (06/07 0900)  Physical Exam:  General: alert, cooperative and no distress Lochia: appropriate Uterine Fundus: n/a Incision: healing well, no significant drainage, vertical, staples intact, dry blood on dressing which was removed and not replaced DVT Evaluation: No evidence of DVT seen on physical exam. .uo Recent Labs    11/26/18 0512 11/27/18 0829  HGB 10.3* 11.4*  HCT 30.1* 33.6*   CMP Latest Ref Rng & Units 11/27/2018 11/26/2018 11/25/2018  Glucose 70 - 99 mg/dL 94 108(H) 123(H)  BUN 6 - 20 mg/dL 38(H) 39(H) 35(H)  Creatinine 0.44 - 1.00 mg/dL 2.19(H) 3.06(H) 4.15(H)  Sodium 135 - 145 mmol/L 138 136 138  Potassium 3.5 - 5.1 mmol/L 4.8 4.4 4.1  Chloride 98 - 111 mmol/L 105 99 93(L)  CO2 22 - 32 mmol/L 24 25 29   Calcium 8.9 - 10.3 mg/dL 8.5(L) 8.5(L) 8.4(L)  Total Protein 6.5 - 8.1 g/dL - 5.8(L) 6.3(L)  Total Bilirubin 0.3 - 1.2 mg/dL - 3.2(H) 2.5(H)  Alkaline Phos 38 - 126 U/L - 107 104  AST 15 - 41 U/L - 1,091(H) 2,714(H)  ALT 0 - 44 U/L - 775(H) 1,073(H)     Assessment/Plan: Status post postpartum EUA [place]. Doing well postoperatively.  Kidney fx improving and renal has signed off. Plat still low, anesthesia will remove epidural catheter likely when 100+ Ambulate, MIralax given anticipate increased flatus, BM.  Emeterio Reeve 11/27/2018, 11:22 AM

## 2018-11-28 LAB — GLUCOSE, CAPILLARY
Glucose-Capillary: 75 mg/dL (ref 70–99)
Glucose-Capillary: 80 mg/dL (ref 70–99)

## 2018-11-28 LAB — RENAL FUNCTION PANEL
Albumin: 2.4 g/dL — ABNORMAL LOW (ref 3.5–5.0)
Anion gap: 7 (ref 5–15)
BUN: 26 mg/dL — ABNORMAL HIGH (ref 6–20)
CO2: 23 mmol/L (ref 22–32)
Calcium: 8.5 mg/dL — ABNORMAL LOW (ref 8.9–10.3)
Chloride: 107 mmol/L (ref 98–111)
Creatinine, Ser: 1.75 mg/dL — ABNORMAL HIGH (ref 0.44–1.00)
GFR calc Af Amer: 46 mL/min — ABNORMAL LOW (ref >60)
GFR calc non Af Amer: 40 mL/min — ABNORMAL LOW (ref >60)
Glucose, Bld: 91 mg/dL (ref 70–99)
Phosphorus: 2.7 mg/dL (ref 2.5–4.6)
Potassium: 3.6 mmol/L (ref 3.5–5.1)
Sodium: 137 mmol/L (ref 135–145)

## 2018-11-28 LAB — CBC
HCT: 31.4 % — ABNORMAL LOW (ref 36.0–46.0)
Hemoglobin: 10.6 g/dL — ABNORMAL LOW (ref 12.0–15.0)
MCH: 29.4 pg (ref 26.0–34.0)
MCHC: 33.8 g/dL (ref 30.0–36.0)
MCV: 87.2 fL (ref 80.0–100.0)
Platelets: 49 10*3/uL — ABNORMAL LOW (ref 150–400)
RBC: 3.6 MIL/uL — ABNORMAL LOW (ref 3.87–5.11)
RDW: 14.1 % (ref 11.5–15.5)
WBC: 10.4 10*3/uL (ref 4.0–10.5)
nRBC: 0.7 % — ABNORMAL HIGH (ref 0.0–0.2)

## 2018-11-28 MED ORDER — OXYCODONE-ACETAMINOPHEN 5-325 MG PO TABS
1.0000 | ORAL_TABLET | ORAL | Status: DC | PRN
  Administered 2018-11-28: 1 via ORAL
  Administered 2018-11-28: 2 via ORAL
  Administered 2018-11-28 (×2): 1 via ORAL
  Administered 2018-11-29 (×2): 2 via ORAL
  Filled 2018-11-28: qty 2
  Filled 2018-11-28 (×2): qty 1
  Filled 2018-11-28 (×2): qty 2
  Filled 2018-11-28: qty 1

## 2018-11-28 NOTE — Consult Note (Signed)
Received phone call from [place], RN inquiring about medication compatibility with Mom who is pumping. Mom noted to be on: Augmentin (L1); Lorazepam prn (L3); Oxycodone (L3)--although more than 40mg  of oxycodone should not be consumed in a 24 hr period; and Hydromorphone (L3). The RID for hydromorphone is calculated to only be 0.67%.  RN states that Mom could use more education on pumping. Lactation to f/u later today.  Elinor Dodge, RN, IBCLC

## 2018-11-28 NOTE — Lactation Note (Signed)
Lactation Consultation Note  Patient Name: Makayla Fletcher Date: 11/28/2018  Follow up visit made to review pumping.  Mom is currently pumping with symphony pump.  Family is sending a video of the baby to help with milk letdown.  Breasts are soft.  Mom states she has not been on any schedule with pumping.  She pumped this morning and obtained 15 mls from each breast.  Reviewed supply and demand.  Encouraged to pump every 3 hours for 15-20 minutes.  Mom has a hands free bra she hasn't used.  Instructed to use so she is able to do hands on pumping.  Mom has a Motiff breast pump at home.  She is hoping to be discharged tomorrow.  Encouraged to call with questions or concerns.                              Interventions    Lactation Tools Discussed/Used     Consult Status      Ave Filter 11/28/2018, 2:40 PM

## 2018-11-28 NOTE — Progress Notes (Signed)
Subjective: Postpartum Day 4: Cesarean Delivery Patient reports incisional pain, tolerating PO and + flatus.    Objective: Vital signs in last 24 hours: Temp:  [98 F (36.7 C)-98.6 F (37 C)] 98.2 F (36.8 C) (06/08 0300) Pulse Rate:  [102-118] 111 (06/08 0600) Resp:  [15-25] 22 (06/08 0600) BP: (116-139)/(91-104) 132/99 (06/08 0600) SpO2:  [93 %-100 %] 98 % (06/08 0600)  Physical Exam:  General: alert, cooperative and no distress Lochia: appropriate Uterine Fundus: n/a, abdomen mildly distended not tender Incision: healing well, no significant drainage DVT Evaluation: No evidence of DVT seen on physical exam.  Recent Labs    11/26/18 0512 11/27/18 0829  HGB 10.3* 11.4*  HCT 30.1* 33.6*   CBC    Component Value Date/Time   WBC 18.7 (H) 11/27/2018 0829   RBC 3.81 (L) 11/27/2018 0829   HGB 11.4 (L) 11/27/2018 0829   HGB 10.0 (L) 08/29/2018 1017   HCT 33.6 (L) 11/27/2018 0829   HCT 29.6 (L) 08/29/2018 1017   PLT 52 (L) 11/27/2018 0829   PLT 123 (L) 08/29/2018 1017   MCV 88.2 11/27/2018 0829   MCV 92 08/29/2018 1017   MCH 29.9 11/27/2018 0829   MCHC 33.9 11/27/2018 0829   RDW 14.6 11/27/2018 0829   RDW 14.3 08/29/2018 1017   LYMPHSABS 1.2 07/25/2018 1520   MONOABS 0.7 11/03/2017 0540   EOSABS 0.1 07/25/2018 1520   BASOSABS 0.0 07/25/2018 1520     Assessment/Plan: Status post Cesarean section. Postoperative course complicated by low platelet count with epidural catheter in place  Repeat CBC this morning D/C PCA.  Emeterio Reeve 11/28/2018, 7:08 AM

## 2018-11-28 NOTE — Progress Notes (Signed)
Result of cbc relayed to DR.Royce Macadamia claimed to d/c the epidural cath tom. Pt made aware.

## 2018-11-28 NOTE — Progress Notes (Signed)
Hydromorphone pca 3 ml wasted in stericycle as witnessed by RN carla.

## 2018-11-28 NOTE — Progress Notes (Signed)
Called lab for cbc to be drawn at once for epidural cath to be removed by anesthesia , awaiting for their coming.

## 2018-11-28 NOTE — Progress Notes (Signed)
Physical Therapy Treatment Patient Details Name: Makayla Fletcher MRN: [id number] DOB: 1995/07/12 Today's Date: 11/28/2018    History of Present Illness 23 yo F PMH depression, sickle cell anemia, migraine who on 6/3 underwent spontaneous vaginal delivery complicated by uterine inversion, retained placenta, post-partum hemorrhage with hemorrhagic shock, emergent hysterectomy. Underwent MTP receiving 5.4L crystalloid, 4 Cryo, 2 plt, 8 PRBC, 5 FFP. Remains intubated.    PT Comments    Patient seen for mobility progression. Patient performing functional mobility at general supervision level for safety. Did report mild cramping with RN notified, otherwise no increase in pain. Patient ambulating around unit with RW for UE support without LOB or overt instability. Making good progress towards goals.    Follow Up Recommendations  Supervision/Assistance - 24 hour;No PT follow up     Equipment Recommendations  Rolling walker with 5" wheels;3in1 (PT)    Recommendations for Other Services       Precautions / Restrictions Precautions Precautions: Fall Restrictions Weight Bearing Restrictions: No    Mobility  Bed Mobility Overal bed mobility: Modified Independent                Transfers Overall transfer level: Needs assistance Equipment used: Rolling walker (2 wheeled) Transfers: Sit to/from Stand Sit to Stand: Min guard;Supervision         General transfer comment: for safety  Ambulation/Gait Ambulation/Gait assistance: Min Gaffer (Feet): 400 Feet Assistive device: Rolling walker (2 wheeled) Gait Pattern/deviations: Step-through pattern;Decreased stride length Gait velocity: decreased   General Gait Details: slow steady pace of gait - no increased pain; no LOB or overt instability   Stairs             Wheelchair Mobility    Modified Rankin (Stroke Patients Only)       Balance Overall balance assessment: Mild deficits observed,  not formally tested                                          Cognition Arousal/Alertness: Awake/alert Behavior During Therapy: WFL for tasks assessed/performed Overall Cognitive Status: Within Functional Limits for tasks assessed                                        Exercises      General Comments General comments (skin integrity, edema, etc.): very motivated to return home soon      Pertinent Vitals/Pain Pain Assessment: Faces Faces Pain Scale: Hurts little more Pain Location: abdomen Pain Descriptors / Indicators: Cramping Pain Intervention(s): Limited activity within patient's tolerance;Monitored during session;Repositioned;Patient requesting pain meds-RN notified    Home Living                      Prior Function            PT Goals (current goals can now be found in the care plan section) Acute Rehab PT Goals Patient Stated Goal: to go home PT Goal Formulation: With patient Time For Goal Achievement: 12/09/18 Potential to Achieve Goals: Good Progress towards PT goals: Progressing toward goals    Frequency    Min 3X/week      PT Plan Current plan remains appropriate    Co-evaluation              AM-PAC PT "6  Clicks" Mobility   Outcome Measure  Help needed turning from your back to your side while in a flat bed without using bedrails?: None Help needed moving from lying on your back to sitting on the side of a flat bed without using bedrails?: None Help needed moving to and from a bed to a chair (including a wheelchair)?: A Little Help needed standing up from a chair using your arms (e.g., wheelchair or bedside chair)?: A Little Help needed to walk in hospital room?: A Little Help needed climbing 3-5 steps with a railing? : A Little 6 Click Score: 20    End of Session Equipment Utilized During Treatment: Gait belt Activity Tolerance: Patient tolerated treatment well Patient left: in bed;with call  bell/phone within reach Nurse Communication: Mobility status PT Visit Diagnosis: Unsteadiness on feet (R26.81);Muscle weakness (generalized) (M62.81);Pain     Time: 1041-1105 PT Time Calculation (min) (ACUTE ONLY): 24 min  Charges:  $Gait Training: 8-22 mins $Therapeutic Activity: 8-22 mins                     Kipp LaurenceStephanie R Tangy Drozdowski, PT, DPT Supplemental Physical Therapist 11/28/18 11:30 AM Pager: [phone] Office: [phone]

## 2018-11-29 LAB — CBC
HCT: 29 % — ABNORMAL LOW (ref 36.0–46.0)
Hemoglobin: 9.8 g/dL — ABNORMAL LOW (ref 12.0–15.0)
MCH: 29.4 pg (ref 26.0–34.0)
MCHC: 33.8 g/dL (ref 30.0–36.0)
MCV: 87.1 fL (ref 80.0–100.0)
Platelets: 60 10*3/uL — ABNORMAL LOW (ref 150–400)
RBC: 3.33 MIL/uL — ABNORMAL LOW (ref 3.87–5.11)
RDW: 14.1 % (ref 11.5–15.5)
WBC: 9.7 10*3/uL (ref 4.0–10.5)
nRBC: 0 % (ref 0.0–0.2)

## 2018-11-29 LAB — TYPE AND SCREEN
ABO/RH(D): O POS
Antibody Screen: NEGATIVE

## 2018-11-29 MED ORDER — SENNOSIDES-DOCUSATE SODIUM 8.6-50 MG PO TABS
2.0000 | ORAL_TABLET | Freq: Once | ORAL | Status: AC
  Administered 2018-11-29: 2 via ORAL
  Filled 2018-11-29: qty 2

## 2018-11-29 MED ORDER — OXYCODONE HCL 5 MG PO TABS
5.0000 mg | ORAL_TABLET | ORAL | Status: DC | PRN
  Administered 2018-11-29: 10 mg via ORAL
  Administered 2018-11-29 (×2): 5 mg via ORAL
  Administered 2018-11-29 – 2018-12-01 (×8): 10 mg via ORAL
  Filled 2018-11-29 (×6): qty 2
  Filled 2018-11-29: qty 1
  Filled 2018-11-29 (×2): qty 2
  Filled 2018-11-29: qty 1
  Filled 2018-11-29: qty 2

## 2018-11-29 MED ORDER — PRENATAL MULTIVITAMIN CH
1.0000 | ORAL_TABLET | Freq: Every day | ORAL | Status: DC
  Administered 2018-11-30: 1 via ORAL
  Filled 2018-11-29: qty 1

## 2018-11-29 NOTE — Progress Notes (Addendum)
OB Update  CBC Latest Ref Rng & Units 11/29/2018 11/28/2018 11/27/2018  WBC 4.0 - 10.5 K/uL 9.7 10.4 18.7(H)  Hemoglobin 12.0 - 15.0 g/dL 9.8(L) 10.6(L) 11.4(L)  Hematocrit 36.0 - 46.0 % 29.0(L) 31.4(L) 33.6(L)  Platelets 150 - 400 K/uL 60(L) 49(L) 52(L)   D/w Dr. Tobias Alexander and he'll assess.   Durene Romans MD Attending Center for [employer] (Faculty Practice) 11/29/2018 Time: [phone]

## 2018-11-29 NOTE — Progress Notes (Signed)
Epidural cath removed by anesthesia, no bleeding noted. No complaints presented.

## 2018-11-29 NOTE — Plan of Care (Signed)
  Problem: Elimination: Goal: Will not experience complications related to bowel motility Note:  Bowel sounds hypoactive all four quadrants.  Goal: Will not experience complications related to urinary retention Outcome: Completed/Met Note:  Patient experiencing no complications related to urinary retention upon assessment.    Problem: Pain Managment: Goal: General experience of comfort will improve Note:  Patient experiencing gas pain. Ambulated with patient in hall. Medication given.    Problem: Safety: Goal: Ability to remain free from injury will improve Outcome: Progressing   Problem: Skin Integrity: Goal: Risk for impaired skin integrity will decrease Outcome: Progressing Note:  Continuing to monitor incision site. Staples clean and dry at this time.    Problem: Activity: Goal: Ability to tolerate increased activity will improve Outcome: Completed/Met   Problem: Role Relationship: Goal: Method of communication will improve Outcome: Completed/Met   Problem: Acute Rehab PT Goals(only PT should resolve) Goal: Pt Will Go Supine/Side To Sit Outcome: Completed/Met Goal: Patient Will Transfer Sit To/From Stand Outcome: Completed/Met Goal: Pt Will Ambulate Outcome: Completed/Met

## 2018-11-29 NOTE — Progress Notes (Signed)
Transferred to [facility]. By wheelchair, report given to RN, belongings with pt.

## 2018-11-29 NOTE — Progress Notes (Addendum)
Obstetrics Daily Progress Note  Admission Date: 11/22/2018 Current Date: 11/29/2018 8:48 AM  Makayla Fletcher is a 23 y.o. [id number] POD#6 EUA, supracervical hysterectomy,cysto for [place] @ 39wks due to uterine inversion/atony, admitted for hemoglobin [place] IOL  Pregnancy complicated by: Patient Active Problem List   Diagnosis Date Noted  . AKI (acute kidney injury) ([place]) 11/23/2018  . Abnormal transaminases 11/23/2018  . Sickle cell disease ([place]) 11/22/2018  . Encounter for induction of labor 11/22/2018  . Maternal sickle cell anemia affecting pregnancy, antepartum ([place]) 05/11/2018  . Supervision of normal first pregnancy, antepartum 05/04/2018  . Vitamin D deficiency 06/06/2015  . Migraine without aura 12/18/2013  . 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 Sickle cell anemia ([place]) 06/17/2012  . Depression, major, single episode, moderate ([place]) 07/31/2011  . Generalized anxiety disorder 07/31/2011    Overnight/24hr events:  Ativan x 1 for anxiety  Subjective:  No nausea, fevers, chills, chest pain, sob. Pain moderately controlled with PO. +flatus, no BM  Objective:    Current Vital Signs 24h Vital Sign Ranges  T 98.6 F (37 C) Temp  Avg: 98.6 F (37 C)  Min: 98.3 F (36.8 C)  Max: 99.1 F (37.3 C)  BP 120/88 BP  Min: 120/88  Max: 142/90  HR 100 Pulse  Avg: 100.7  Min: 89  Max: 111  RR (!) 25 Resp  Avg: 22.4  Min: 17  Max: 28  SaO2 98 % Room Air SpO2  Avg: 99.1 %  Min: 98 %  Max: 100 %       24 Hour I/O Current Shift I/O  Time Ins Outs 06/08 0701 - 06/09 0700 In: 240 [P.O.:240] Out: -  No intake/output data recorded.   Patient Vitals for the past 24 hrs:  BP Temp Temp src Pulse Resp SpO2  11/29/18 0824 - - - - - 98 %  11/29/18 0500 - - - - (!) 25 -  11/29/18 0350 120/88 98.6 F (37 C) Oral 100 (!) 22 98 %  11/29/18 0042 - - - - (!) 28 -  11/28/18 2330 120/85 98.5 F (36.9 C) Oral 100 (!) 23 98 %  11/28/18 2030 - - - - 17 -  11/28/18 1926  139/90 99.1 F (37.3 C) Oral 89 (!) 27 100 %  11/28/18 1620 (!) 125/94 98.3 F (36.8 C) Oral (!) 111 18 100 %  11/28/18 1603 - - - (!) 106 - 100 %  11/28/18 1043 (!) 142/90 98.5 F (36.9 C) Oral 98 19 100 %   UOP: >182mL/hr. Patient just voided.   Physical exam: General: NAD, watching TV Abdomen: +BS, mild to moderate distension, nttp. C/d/i vertical midline incision with staples in place CV: normal s1 and s2, no MRGs Pulmonary: CTAB, no respiratory distress GU: no gross VB.   Medications: Current Facility-Administered Medications  Medication Dose Route Frequency Provider Last Rate Last Dose  . amoxicillin-clavulanate (AUGMENTIN) 875-125 MG per tablet 1 tablet  1 tablet Oral Q12H Aletha Halim, MD   1 tablet at 11/28/18 2111  . Chlorhexidine Gluconate Cloth 2 % PADS 6 each  6 each Topical Q0600 Truett Mainland, DO   6 each at 11/28/18 1008  . coconut oil  1 application Topical PRN Aletha Halim, MD   1 application at 42/35/36 1512  . witch hazel-glycerin (TUCKS) pad 1 application  1 application Topical PRN Aletha Halim, MD   1 application at 14/43/15 2255   And  . dibucaine (NUPERCAINAL) 1 %  rectal ointment 1 application  1 application Rectal PRN [place] BingPickens, Daune Colgate, MD      . LORazepam (ATIVAN) injection 1 mg  1 mg Intravenous Q6H PRN [place] BingPickens, Jaia Alonge, MD   0.5 mg at 11/28/18 2326  . MEDLINE mouth rinse  15 mL Mouth Rinse BID [place] BingPickens, Tayven Renteria, MD   15 mL at 11/28/18 2112  . oxyCODONE-acetaminophen (PERCOCET/ROXICET) 5-325 MG per tablet 1-2 tablet  1-2 tablet Oral Q4H PRN [place] PhenixArnold, James G, MD   2 tablet at 11/29/18 0502  . polyethylene glycol (MIRALAX / GLYCOLAX) packet 17 g  17 g Oral Daily [place] BingPickens, Montay Vanvoorhis, MD   17 g at 11/28/18 1008  . simethicone (MYLICON) chewable tablet 80 mg  80 mg Oral TID [place] BingPickens, Celest Reitz, MD   80 mg at 11/28/18 2111    Labs:  Recent Labs  Lab 11/26/18 0512 11/27/18 0829 11/28/18 1109  WBC 21.0* 18.7* 10.4  HGB 10.3* 11.4* 10.6*  HCT 30.1*  33.6* 31.4*  PLT 35* 52* 49*    Recent Labs  Lab 11/23/18 1027  11/25/18 0453 11/26/18 0512 11/27/18 0210 11/28/18 0217  NA 141   < > 138 136 138 137  K 4.8   < > 4.1 4.4 4.8 3.6  CL 110   < > 93* 99 105 107  CO2 20*   < > 29 25 24 23   BUN 7   < > 35* 39* 38* 26*  CREATININE 1.02*   < > 4.15* 3.06* 2.19* 1.75*  CALCIUM 7.1*   < > 8.4* 8.5* 8.5* 8.5*  PROT 3.4*  --  6.3* 5.8*  --   --   BILITOT 4.9*  --  2.5* 3.2*  --   --   ALKPHOS 48  --  104 107  --   --   ALT 349*  --  1,073* 775*  --   --   AST 663*  --  2,714* 1,091*  --   --   GLUCOSE 116*   < > 123* 108* 94 91   < > = values in this interval not displayed.    Radiology:  No new imaging  ECHOCARDIOGRAM REPORT       Patient Name:   Makayla Fletcher Date of Exam: 11/25/2018 Medical Rec #:  [id number]         Height:       62.0 in Accession #:    [id number]        Weight:       153.9 lb Date of Birth:  1996-02-26         BSA:          1.71 m Patient Age:    22 years          BP:           122/88 mmHg Patient Gender: F                 HR:           124 bpm. Exam Location:  Inpatient    Procedure: 2D Echo, Cardiac Doppler and Color Doppler  Indications:    I51.7 Cardiomegaly   History:        Patient has prior history of Echocardiogram examinations, most                 recent 03/29/2014. Sickle Cell Anemia.   Sonographer:    Elmarie Shileyiffany Dance Referring Phys: [id number] SARAH F GROCE  IMPRESSIONS    1. The left ventricle  has normal systolic function, with an ejection fraction of 55-60%. The cavity size was normal. Indeterminate diastolic filling due to E-A fusion.  2. Trivial pericardial effusion is present.  3. The mitral valve is abnormal. Mild thickening of the mitral valve leaflet. There is moderate mitral annular calcification present.  4. The tricuspid valve is grossly normal.  5. The aortic root is normal in size and structure.  6. The interatrial septum was not assessed.  FINDINGS  Left Ventricle:  The left ventricle has normal systolic function, with an ejection fraction of 55-60%. The cavity size was normal. There is no increase in left ventricular wall thickness. Indeterminate diastolic filling due to E-A fusion.  Right Ventricle: The right ventricle has normal systolic function. The cavity was normal. There is right vetricular wall thickness was not assessed.  Left Atrium: Left atrial size was normal in size.  Right Atrium: Right atrial size was normal in size. Right atrial pressure is estimated at 3 mmHg.  Interatrial Septum: The interatrial septum was not assessed.  Pericardium: Trivial pericardial effusion is present.  Mitral Valve: The mitral valve is abnormal. Mild thickening of the mitral valve leaflet. There is moderate mitral annular calcification present. Mitral valve regurgitation is trivial by color flow Doppler.  Tricuspid Valve: The tricuspid valve is grossly normal. Tricuspid valve regurgitation was not visualized by color flow Doppler.  Aortic Valve: The aortic valve is normal in structure. Aortic valve regurgitation was not visualized by color flow Doppler.  Pulmonic Valve: The pulmonic valve was grossly normal. Pulmonic valve regurgitation is not visualized by color flow Doppler.  Aorta: The aortic root is normal in size and structure.  Venous: The inferior vena cava is normal in size with greater than 50% respiratory variability.    +--------------+--------++ LEFT VENTRICLE         +----------------+----------++ +--------------+--------++ Diastology                 PLAX 2D                +----------------+----------++ +--------------+--------++ LV e' lateral:  11.20 cm/s LVIDd:        4.55 cm  +----------------+----------++ +--------------+--------++ LV E/e' lateral:7.9        LVIDs:        3.35 cm  +----------------+----------++ +--------------+--------++ LV e' medial:   7.83 cm/s  LV PW:        1.04 cm   +----------------+----------++ +--------------+--------++ LV E/e' medial: 11.4       LV IVS:       0.65 cm  +----------------+----------++ +--------------+--------++ LVOT diam:    2.10 cm  +--------------+--------++ LV SV:        49 ml    +--------------+--------++ LV SV Index:  27.77    +--------------+--------++ LVOT Area:    3.46 cm +--------------+--------++                        +--------------+--------++  +---------------+----------++ RIGHT VENTRICLE           +---------------+----------++ RV Basal diam: 2.53 cm    +---------------+----------++ RV S prime:    16.40 cm/s +---------------+----------++ TAPSE (M-mode):2.3 cm     +---------------+----------++  +---------------+-------++-----------++ LEFT ATRIUM           Index       +---------------+-------++-----------++ LA diam:       2.80 cm1.64 cm/m  +---------------+-------++-----------++ LA Vol (A2C):  49.0 ml28.65 ml/m +---------------+-------++-----------++ LA Vol (A4C):  37.9 ml22.16 ml/m +---------------+-------++-----------++ LA Biplane Vol:43.0  ml25.14 ml/m +---------------+-------++-----------++ +------------+---------++-----------++ RIGHT ATRIUM         Index       +------------+---------++-----------++ RA Area:    14.90 cm            +------------+---------++-----------++ RA Volume:  37.20 ml 21.75 ml/m +------------+---------++-----------++  +------------+-----------++ AORTIC VALVE            +------------+-----------++ LVOT Vmax:  79.10 cm/s  +------------+-----------++ LVOT Vmean: 41.800 cm/s +------------+-----------++ LVOT VTI:   0.107 m     +------------+-----------++   +-------------+-------++ AORTA                +-------------+-------++ Ao Root diam:2.90 cm +-------------+-------++ Ao Asc diam: 2.30  cm +-------------+-------++  +--------------+----------++ MITRAL VALVE             +--------------+-------+ +--------------+----------++ SHUNTS                MV Area (PHT):6.63 cm   +--------------+-------+ +--------------+----------++ Systemic VTI: 0.11 m  MV PHT:       33.20 msec +--------------+-------+ +--------------+----------++ Systemic Diam:2.10 cm MV Decel Time:115 msec   +--------------+-------+ +--------------+----------++ +--------------+----------++ MV E velocity:89.00 cm/s +--------------+----------++    Dietrich PatesPaula Ross MD Electronically signed by Dietrich PatesPaula Ross MD Signature Date/Time: 11/25/2018/7:48:22 PM  Assessment & Plan:  Patient stable *Postpartum: lactation seeing patient *Postop: voiding well and taking regular diet. Will switch to just regular oxycodone to limit apap exposure. Remove staples at POD#7-10. F/u plt count and d/w anesthesia re: epidural removal. O/n team ordered 2U platelets in case needed prior to removal.  *Heme: labs just drawn. *Renal: see above. Repeat cmp tomorrow. Most likely due to PP hemorrhage, hypovolemia. Cr improving. -Renal signed off *CV: tachycardia improving. No events on tele o/n -normal echo on 6/5 *Liver: see above.  Repeat cmp tomorrow.  *FEN/GI: regular diet, SLIV. qday miralax and simethicone with meals.  -6/4: Patient passed swallow study *Pulmonary: can call CCM with any questions -ET removed on 6/4. CCM signed off. Continue to watch for TRALI *ID: D#[id number] of Augmentin for likely PNA. Continue until tomorrow. S/p ancef and azithromycin during her hysterectomy. S/p one day of zosyn prior to switching to Unasyn. S/p two days of Unasyn.  *PT: following and pt doing well *PPx: SCDs *Dispo: pt improving well. Okay to transfer to regular antenatal floor (1st floor OB specialty care)  Cornelia Copaharlie Sophiamarie Nease, Jr. MD Attending Center for [facility] (Faculty Practice) [employer] phone: [phone]

## 2018-11-29 NOTE — Progress Notes (Signed)
Platelets 60.  Patient appears to be clotting normally.  Catheter removed in tact.  Site clear and dry. Discussed with Dr. Ilda Basset.

## 2018-11-29 NOTE — Addendum Note (Signed)
Addendum  created 11/29/18 0949 by Duane Boston, MD   Clinical Note Signed, Intraprocedure LDAs edited, LDA properties accepted

## 2018-11-30 LAB — COMPREHENSIVE METABOLIC PANEL
ALT: 115 U/L — ABNORMAL HIGH (ref 0–44)
AST: 56 U/L — ABNORMAL HIGH (ref 15–41)
Albumin: 2.5 g/dL — ABNORMAL LOW (ref 3.5–5.0)
Alkaline Phosphatase: 93 U/L (ref 38–126)
Anion gap: 10 (ref 5–15)
BUN: 13 mg/dL (ref 6–20)
CO2: 24 mmol/L (ref 22–32)
Calcium: 8.6 mg/dL — ABNORMAL LOW (ref 8.9–10.3)
Chloride: 103 mmol/L (ref 98–111)
Creatinine, Ser: 1.24 mg/dL — ABNORMAL HIGH (ref 0.44–1.00)
GFR calc Af Amer: >60 mL/min (ref >60)
GFR calc non Af Amer: >60 mL/min (ref >60)
Glucose, Bld: 110 mg/dL — ABNORMAL HIGH (ref 70–99)
Potassium: 3.4 mmol/L — ABNORMAL LOW (ref 3.5–5.1)
Sodium: 137 mmol/L (ref 135–145)
Total Bilirubin: 3 mg/dL — ABNORMAL HIGH (ref 0.3–1.2)
Total Protein: 5.9 g/dL — ABNORMAL LOW (ref 6.5–8.1)

## 2018-11-30 LAB — PREPARE PLATELET PHERESIS: Unit division: 0

## 2018-11-30 LAB — BPAM PLATELET PHERESIS
Blood Product Expiration Date: 202006112359
Unit Type and Rh: 7300

## 2018-11-30 MED ORDER — BISACODYL 10 MG RE SUPP
10.0000 mg | Freq: Once | RECTAL | Status: AC
  Administered 2018-11-30: 10 mg via RECTAL
  Filled 2018-11-30: qty 1

## 2018-11-30 NOTE — Lactation Note (Signed)
This note was copied from a baby's chart. Lactation Consultation Note  Patient Name: Makayla Fletcher [id number] Date: 11/30/2018 Reason for consult: Follow-up assessment  1654 - 1718 - I followed up with [name] to see how pumping and breast feeding was going. She reports that she has only pumped occasionally. She last pumped 15 mls combined. Mom has a DEBP set up and at home she has a Publishing rights manager. Baby and dad were in room, and mom states that she was feeling better. She was sitting up on the side of her bed when I entered, and dad had baby in his arms.  [name] expressed enthusiasm for increasing milk production and latching her daughter, Makayla Fletcher, to the breast. I provided her with a plan to pump 8 or more times a day for 15+ minutes. I reviewed how to clean pump equipment (she had pump equipment soaking in her basin). Dad is very enthusiastic as well, and he verbalized understanding. We also reviewed supplementation volume guidelines.   Mom states that [place] latched on to her breast yesterday. She was very pleased. We discussed holding baby skin to skin and doing lots of "lick and learn." I encouraged mom to hand express her milk onto her breast prior to latching and to occasionally dot a little milk onto her breast with a curved tip syringe (provided) to help Makayla Fletcher maintain latch.  I also showed parents how to pace bottle feed. Makayla Fletcher had 50 mls about 1 hour prior to my visit.  I offered to return to help mom breast feed. She seemed interested, and she will page when ready.  Mom also seemed interested in an outpatient follow up appointment, and I agreed to set that up for her.   Maternal Data Has patient been taught Hand Expression?: Yes Does the patient have breastfeeding experience prior to this delivery?: No   Interventions Interventions: Breast feeding basics reviewed  Lactation Tools Discussed/Used Flange Size: 24 Breast pump type: Double-Electric Breast Pump Pump  Review: Setup, frequency, and cleaning;Milk Storage   Consult Status Consult Status: Follow-up Date: 12/01/18 Follow-up type: In-patient    Makayla Fletcher 11/30/2018, 8:20 PM

## 2018-11-30 NOTE — Progress Notes (Signed)
Obstetrics Daily Progress Note  Admission Date: 11/22/2018 Current Date: 11/30/2018 1:04 PM  Makayla Fletcher is a 23 y.o. [id number] POD#7 EUA, supracervical hysterectomy,cysto for [place] @ 39wks due to uterine inversion/atony, admitted for hemoglobin Benedict IOL  Pregnancy complicated by: Patient Active Problem List   Diagnosis Date Noted  . AKI (acute kidney injury) ([place]) 11/23/2018  . Abnormal transaminases 11/23/2018  . Sickle cell disease ([place]) 11/22/2018  . Encounter for induction of labor 11/22/2018  . Maternal sickle cell anemia affecting pregnancy, antepartum ([place]) 05/11/2018  . Supervision of normal first pregnancy, antepartum 05/04/2018  . Vitamin D deficiency 06/06/2015  . Migraine without aura 12/18/2013  . 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 Sickle cell anemia ([place]) 06/17/2012  . Depression, major, single episode, moderate ([place]) 07/31/2011  . Generalized anxiety disorder 07/31/2011    Overnight/24hr events:  Moved to [facility] yesterday, no sentinel events.   Subjective:  No nausea, fevers, chills, chest pain, sob. Pain moderately controlled with PO. +flatus, no BM yet. Desires suppository.  Objective:    Current Vital Signs 24h Vital Sign Ranges  T 98.8 F (37.1 C) Temp  Avg: 98.8 F (37.1 C)  Min: 98 F (36.7 C)  Max: 99.3 F (37.4 C)  BP 127/86 BP  Min: 127/86  Max: 143/89  HR (!) 101 Pulse  Avg: 102.8  Min: 95  Max: 111  RR 16 Resp  Avg: 17.2  Min: 16  Max: 18  SaO2 100 % Room Air SpO2  Avg: 99.5 %  Min: 99 %  Max: 100 %       24 Hour I/O Current Shift I/O  Time Ins Outs 06/09 0701 - 06/10 0700 In: 600 [P.O.:600] Out: 700 [Urine:700] No intake/output data recorded.   Patient Vitals for the past 24 hrs:  BP Temp Temp src Pulse Resp SpO2  11/30/18 1225 127/86 98.8 F (37.1 C) Oral (!) 101 16 100 %  11/30/18 0755 (!) 139/91 99.2 F (37.3 C) Oral 95 17 99 %  11/30/18 0433 136/86 99.3 F (37.4 C) Oral 98 17 -  11/29/18 2000 - - - -  - 99 %  11/29/18 1956 (!) 142/94 98 F (36.7 C) Oral (!) 111 18 -  11/29/18 1550 (!) 143/89 98.8 F (37.1 C) Oral (!) 109 18 100 %    Physical exam: General: NAD, watching TV Abdomen: +BS, mild to moderate distension, nttp. C/d/i vertical midline incision with staples in place CV: normal s1 and s2, no MRGs Pulmonary: CTAB, no respiratory distress GU: no gross VB.   Medications: Current Facility-Administered Medications  Medication Dose Route Frequency Provider Last Rate Last Dose  . coconut oil  1 application Topical PRN Aletha Halim, MD   1 application at 93/79/02 1512  . witch hazel-glycerin (TUCKS) pad 1 application  1 application Topical PRN Aletha Halim, MD   1 application at 40/97/35 2255   And  . dibucaine (NUPERCAINAL) 1 % rectal ointment 1 application  1 application Rectal PRN Aletha Halim, MD      . MEDLINE mouth rinse  15 mL Mouth Rinse BID Aletha Halim, MD   15 mL at 11/30/18 0930  . oxyCODONE (Oxy IR/ROXICODONE) immediate release tablet 5-10 mg  5-10 mg Oral Q4H PRN Aletha Halim, MD   10 mg at 11/30/18 1255  . polyethylene glycol (MIRALAX / GLYCOLAX) packet 17 g  17 g Oral Daily Aletha Halim, MD   17 g at 11/30/18 0929  . prenatal multivitamin tablet  1 tablet  1 tablet Oral Q1200 [place] BingPickens, Charlie, MD   1 tablet at 11/30/18 1255  . simethicone (MYLICON) chewable tablet 80 mg  80 mg Oral TID [place] BingPickens, Charlie, MD   80 mg at 11/30/18 0929    Labs:  Recent Labs  Lab 11/27/18 0829 11/28/18 1109 11/29/18 0822  WBC 18.7* 10.4 9.7  HGB 11.4* 10.6* 9.8*  HCT 33.6* 31.4* 29.0*  PLT 52* 49* 60*    Recent Labs  Lab 11/25/18 0453 11/26/18 0512 11/27/18 0210 11/28/18 0217 11/30/18 0632  NA 138 136 138 137 137  K 4.1 4.4 4.8 3.6 3.4*  CL 93* 99 105 107 103  CO2 29 25 24 23 24   BUN 35* 39* 38* 26* 13  CREATININE 4.15* 3.06* 2.19* 1.75* 1.24*  CALCIUM 8.4* 8.5* 8.5* 8.5* 8.6*  PROT 6.3* 5.8*  --   --  5.9*  BILITOT 2.5* 3.2*  --   --  3.0*   ALKPHOS 104 107  --   --  93  ALT 1,073* 775*  --   --  115*  AST 2,714* 1,091*  --   --  56*  GLUCOSE 123* 108* 94 91 110*    Assessment & Plan:  Patient stable *Postop: voiding well and taking regular diet.  Remove staples tomorrow. *Heme: stable hemoglobin *Renal: AKI due to PP hemorrhage, hypovolemia. Cr improving. Adequate UOP. Renal signed off *CV: tachycardia improving. Normal echo on 6/5. Follow BP, had some elevated BP today. No signs of preeclampsia.  *Liver: Trending down.  *FEN/GI: regular diet, SLIV. qday miralax and simethicone with meals.  -6/4: Patient passed swallow study *Pulmonary: can call CCM with any questions -ET removed on 6/4. CCM signed off. Continue to watch for TRALI *ID: D#6 of Augmentin for likely PNA. Continue until tomorrow. S/p ancef and azithromycin during her hysterectomy. S/p one day of zosyn prior to switching to Unasyn. S/p two days of Unasyn.  *PT: following and pt doing well *PPx: SCDs *Dispo: pt improving well. Likely will go home in 1-2 days.  Jaynie CollinsUGONNA  , MD, FACOG Obstetrician & Gynecologist, [facility] for [facility], [facility]

## 2018-11-30 NOTE — Progress Notes (Signed)
Physical Therapy Treatment Patient Details Name: Makayla Fletcher MRN: [id number] DOB: 1996/05/26 Today's Date: 11/30/2018    History of Present Illness 23 yo F PMH depression, sickle cell anemia, migraine who on 6/3 underwent spontaneous vaginal delivery complicated by uterine inversion, retained placenta, post-partum hemorrhage with hemorrhagic shock, emergent hysterectomy. Underwent MTP receiving 5.4L crystalloid, 4 Cryo, 2 plt, 8 PRBC, 5 FFP. Remains intubated.    PT Comments    Patient seen for mobility progression - very motivated to ambulate in hopes for having a BM. Patient taking few short steps in room without AD with noted instability - patient requesting use of RW for increased stability/control with gait. With RW, patient ambulating without assistance for progressive distances. Discussion with patient regarding home equipment and safety with patient to determine need for AD at discharge - may require RW and 3-in-1. No further acute PT needs identified. PT to sign off. Please re-consult if needed.     Follow Up Recommendations  Supervision for mobility/OOB;No PT follow up     Equipment Recommendations  Rolling walker with 5" wheels;3in1 (PT)    Recommendations for Other Services       Precautions / Restrictions Precautions Precautions: Fall Restrictions Weight Bearing Restrictions: No    Mobility  Bed Mobility               General bed mobility comments: up in recliner  Transfers Overall transfer level: Modified independent Equipment used: None;Rolling walker (2 wheeled) Transfers: Sit to/from Stand           General transfer comment: increased time and effort  Ambulation/Gait Ambulation/Gait assistance: Supervision Gait Distance (Feet): (>1000) Assistive device: Rolling walker (2 wheeled) Gait Pattern/deviations: Step-through pattern;Decreased stride length;Trunk flexed Gait velocity: decreased   General Gait Details: a few steps in room without  AD - preferred use of RW for stability   Stairs             Wheelchair Mobility    Modified Rankin (Stroke Patients Only)       Balance Overall balance assessment: Mild deficits observed, not formally tested                                          Cognition Arousal/Alertness: Awake/alert Behavior During Therapy: WFL for tasks assessed/performed Overall Cognitive Status: Within Functional Limits for tasks assessed                                        Exercises      General Comments General comments (skin integrity, edema, etc.): husband and daughter present - very appreciative and motivated      Pertinent Vitals/Pain Pain Assessment: Faces Faces Pain Scale: Hurts a little bit Pain Location: abdomen Pain Descriptors / Indicators: Cramping Pain Intervention(s): Limited activity within patient's tolerance;Monitored during session;Repositioned    Home Living                      Prior Function            PT Goals (current goals can now be found in the care plan section) Acute Rehab PT Goals Patient Stated Goal: to go home PT Goal Formulation: With patient Time For Goal Achievement: 12/09/18 Potential to Achieve Goals: Good Progress towards PT goals: Goals met/education completed, patient  discharged from PT    Frequency    Min 3X/week      PT Plan Current plan remains appropriate(d/c from PT today)    Co-evaluation              AM-PAC PT "6 Clicks" Mobility   Outcome Measure  Help needed turning from your back to your side while in a flat bed without using bedrails?: None Help needed moving from lying on your back to sitting on the side of a flat bed without using bedrails?: None Help needed moving to and from a bed to a chair (including a wheelchair)?: None Help needed standing up from a chair using your arms (e.g., wheelchair or bedside chair)?: None Help needed to walk in hospital room?: A  Little Help needed climbing 3-5 steps with a railing? : A Little 6 Click Score: 22    End of Session   Activity Tolerance: Patient tolerated treatment well Patient left: in chair;with call bell/phone within reach;with family/visitor present Nurse Communication: Mobility status PT Visit Diagnosis: Unsteadiness on feet (R26.81);Muscle weakness (generalized) (M62.81);Pain     Time: 1118-1140 PT Time Calculation (min) (ACUTE ONLY): 22 min  Charges:  $Gait Training: 8-22 mins                      Lanney Gins, PT, DPT Supplemental Physical Therapist 11/30/18 12:45 PM Pager: [phone] Office: [phone]

## 2018-12-01 ENCOUNTER — Encounter (HOSPITAL_COMMUNITY): Payer: Self-pay | Admitting: Obstetrics and Gynecology

## 2018-12-01 DIAGNOSIS — K567 Ileus, unspecified: Secondary | ICD-10-CM | POA: Diagnosis not present

## 2018-12-01 DIAGNOSIS — R571 Hypovolemic shock: Secondary | ICD-10-CM | POA: Diagnosis not present

## 2018-12-01 DIAGNOSIS — Z90711 Acquired absence of uterus with remaining cervical stump: Secondary | ICD-10-CM | POA: Diagnosis not present

## 2018-12-01 LAB — COMPREHENSIVE METABOLIC PANEL
ALT: 86 U/L — ABNORMAL HIGH (ref 0–44)
AST: 50 U/L — ABNORMAL HIGH (ref 15–41)
Albumin: 2.5 g/dL — ABNORMAL LOW (ref 3.5–5.0)
Alkaline Phosphatase: 91 U/L (ref 38–126)
Anion gap: 11 (ref 5–15)
BUN: 14 mg/dL (ref 6–20)
CO2: 24 mmol/L (ref 22–32)
Calcium: 8.4 mg/dL — ABNORMAL LOW (ref 8.9–10.3)
Chloride: 101 mmol/L (ref 98–111)
Creatinine, Ser: 1.27 mg/dL — ABNORMAL HIGH (ref 0.44–1.00)
GFR calc Af Amer: >60 mL/min (ref >60)
GFR calc non Af Amer: 59 mL/min — ABNORMAL LOW (ref >60)
Glucose, Bld: 97 mg/dL (ref 70–99)
Potassium: 3.4 mmol/L — ABNORMAL LOW (ref 3.5–5.1)
Sodium: 136 mmol/L (ref 135–145)
Total Bilirubin: 2.6 mg/dL — ABNORMAL HIGH (ref 0.3–1.2)
Total Protein: 6 g/dL — ABNORMAL LOW (ref 6.5–8.1)

## 2018-12-01 LAB — CBC
HCT: 27.2 % — ABNORMAL LOW (ref 36.0–46.0)
Hemoglobin: 9.3 g/dL — ABNORMAL LOW (ref 12.0–15.0)
MCH: 29.5 pg (ref 26.0–34.0)
MCHC: 34.2 g/dL (ref 30.0–36.0)
MCV: 86.3 fL (ref 80.0–100.0)
Platelets: 115 10*3/uL — ABNORMAL LOW (ref 150–400)
RBC: 3.15 MIL/uL — ABNORMAL LOW (ref 3.87–5.11)
RDW: 14.1 % (ref 11.5–15.5)
WBC: 10 10*3/uL (ref 4.0–10.5)
nRBC: 0 % (ref 0.0–0.2)

## 2018-12-01 MED ORDER — SIMETHICONE 80 MG PO CHEW: 80.0000 mg | CHEWABLE_TABLET | Freq: Four times a day (QID) | ORAL | 0 refills | Status: DC | PRN

## 2018-12-01 MED ORDER — DOCUSATE SODIUM 100 MG PO CAPS: 100.0000 mg | ORAL_CAPSULE | Freq: Two times a day (BID) | ORAL | 2 refills | Status: DC | PRN

## 2018-12-01 MED ORDER — OXYCODONE HCL 10 MG PO TABS: 10.0000 mg | ORAL_TABLET | Freq: Four times a day (QID) | ORAL | 0 refills | Status: DC | PRN

## 2018-12-01 MED ORDER — POLYETHYLENE GLYCOL 3350 17 G PO PACK: 17.0000 g | PACK | Freq: Every day | ORAL | 1 refills | Status: DC | PRN

## 2018-12-01 MED ORDER — BISACODYL 10 MG RE SUPP: 10.0000 mg | RECTAL | 2 refills | Status: DC | PRN

## 2018-12-01 NOTE — Progress Notes (Signed)
CSW received consult due to score 11 on Edinburgh Depression Screen.    CSW met with MOB at bedside to discuss edinburgh score 11, MOB was in the process of getting ready for discharge. CSW introduced self and explained reason for consult. MOB was welcoming and sitting bedside infant that was in the car seat. CSW and MOB discussed MOB's edinburgh score 11. MOB reported that her hospital stay has been a lot and she has felt overwhelmed. CSW acknowledged and validated MOB's feelings surrounding her hospital stay. CSW inquired about how MOB was currently feeling, MOB reported that she is feeling a lot better. MOB reported that she feels good about going home. CSW inquired about MOB's mental health history, MOB reported that she was diagnosed with Anxiety and Depression in 2015. MOB reported that she had an inpatient psychiatric hospitalization in 2015 after going through a lot. MOB reported that the hospitalization was helpful and she was started on medication and only took it 2 out of the 7 days she was hospitalized. MOB reported that she didn't like the way the medicine made her feel but she thought the therapy was effective. MOB reported that she was in therapy for one year. MOB denied any current depressive and or anxiety symptoms.  CSW inquired about MOB's support system, MOB reported that her mother and fiance are her supports. MOB reported that she has all items to care for infant at home. MOB presented calm and did not demonstrate any acute mental health signs/symptoms. CSW assessed for safety, MOB denied SI, HI and domestic violence. CSW informed MOB that due to her mental health history she may be more susceptible to postpartum depression.   CSW provided education regarding Baby Blues vs PMADs and provided MOB with resources for mental health follow up.  CSW encouraged MOB to evaluate her mental health throughout the postpartum period with the use of the New Mom Checklist developed by [facility]partum [facility]  as well as the [place] Postnatal Depression Scale and notify a medical professional if symptoms arise.     Abundio Miu, [place] [facility] Cell#: [phone]

## 2018-12-01 NOTE — Lactation Note (Signed)
Lactation Consultation Note:  Mother reports that she was fit with several sizes of nipple shields yesterday.  Mother reports that she has not felt good due to gas discomfort and she has not pumped . She reports that the last time she pumped she only got a small amt. Mother reports that she plans to pump when she gets home. Discussed using good breast massage, hand expressing with frequent pumping at least every 2-3 hours. Mother reports that she felt her milk come in with fullness of the breast on about day 4-5.  Mother advised to follow up with [facility] for OP dept if she continues to try and breastfeed infant. Mother is aware of available [place] services as needed. Mother has number to call for questions or concerns about pumping or breastfeeding.   Patient Name: LAKEYIA SURBER DCVUD'T Date: 12/01/2018 Reason for consult: Follow-up assessment   Maternal Data    Feeding    LATCH Score                   Interventions Interventions: Breast massage;Expressed milk;Hand pump;DEBP  Lactation Tools Discussed/Used     Consult Status Consult Status: Complete    Darla Lesches 12/01/2018, 11:25 AM

## 2018-12-01 NOTE — Discharge Instructions (Signed)
Supracervical Hysterectomy, Care After This sheet gives you information about how to care for yourself after your procedure. Your health care provider may also give you more specific instructions. If you have problems or questions, contact your health care provider. What can I expect after the procedure? After the procedure, it is common to have some discomfort, tenderness, swelling, and bruising at the surgical area. This normally lasts for about 2 weeks. Follow these instructions at home: Medicines  Take over-the-counter and prescription medicines only as told by your health care provider.  Do not take aspirin. It can cause bleeding. Ask your health care provider when it is safe to use aspirin again.  Do not drive or use heavy machinery while taking prescription pain medicine.  To prevent or treat constipation while you are taking prescription pain medicine, your health care provider may recommend that you: ? Drink enough fluid to keep your urine pale yellow. ? Take over-the-counter or prescription medicines. ? Eat foods that are high in fiber, such as fresh fruits and vegetables, whole grains, and beans. ? Limit foods that are high in fat and processed sugars, such as fried and sweet foods. Activity  Get plenty of rest and sleep.  Try to have someone home with you for 1-2 weeks to help you with everyday chores.  Return to your normal activities as told by your health care provider. Ask your health care provider what activities are safe for you.  Do not lift anything that is heavier than 10 lb (4.5 kg) or the limit that your health care provider tells you until he or she says that it is safe.  Do not douche, use tampons, or have sex for at least 6 weeks or until your health care provider says it is safe to do so. Incision care   Follow instructions from your health care provider about how to take care of your incisions. Make sure you: ? Wash your hands with soap and water before  you change your bandage (dressing). If soap and water are not available, use hand sanitizer. ? Change your dressing as told by your health care provider. ? Leave stitches (sutures), skin glue, or adhesive strips in place. These skin closures may need to stay in place for 2 weeks or longer. If adhesive strip edges start to loosen and curl up, you may trim the loose edges. Do not remove adhesive strips completely unless your health care provider tells you to do that.  Check your incision area every day for signs of infection. Check for: ? Redness, swelling, or pain. ? Fluid or blood. ? Warmth. ? Pus or a bad smell.  Take showers instead of baths for 2-3 weeks or as told by your health care provider. Eating and drinking  Drink enough fluids to keep your urine clear or pale yellow.  Do not drink alcohol until your health care provider says it is okay to do so. General instructions  Monitor your temperature for as long as told by your health care provider.  Keep all follow-up visits as told by your health care provider. This is important. Contact a health care provider if:  You have redness, swelling, or pain around an incision.  You have chills or fever.  You have fluid or blood coming from an incision.  An incision feels warm to the touch.  You have pus or a bad smell coming from an incision.  Your incisions break open.  You feel dizzy or lightheaded.  You have pain or  bleeding when you urinate.  You have diarrhea that does not go away.  You have nausea and vomiting that do not go away.  You have abnormal vaginal discharge.  You have a rash.  You have pain that does not go away when you take medicine. Get help right away if:  You have a fever and your symptoms suddenly get worse.  You have severe abdominal pain.  You have chest pain.  You have shortness of breath.  You faint.  You have pain, swelling, or redness in your leg.  You have heavy vaginal bleeding  with blood clots. Summary  After the procedure, it is common to have some discomfort, tenderness, swelling, and bruising at the surgical site. This normally lasts for about 2 weeks.  Get help right away if you have excessive vaginal bleeding or severe abdominal pain. This information is not intended to replace advice given to you by your health care provider. Make sure you discuss any questions you have with your health care provider. Document Released: 03/29/2013 Document Revised: 09/03/2016 Document Reviewed: 09/03/2016 Elsevier Interactive Patient Education  2019 [employer].

## 2018-12-08 ENCOUNTER — Telehealth: Payer: Self-pay | Admitting: Obstetrics & Gynecology

## 2018-12-08 NOTE — Telephone Encounter (Signed)
Attempted to call patient to speak to her about an upcoming appointment. Left a VM for her to call us to get more information.

## 2018-12-09 ENCOUNTER — Ambulatory Visit: Payer: 59 | Admitting: Obstetrics & Gynecology

## 2018-12-09 ENCOUNTER — Encounter: Payer: Self-pay | Admitting: Obstetrics & Gynecology

## 2018-12-09 ENCOUNTER — Other Ambulatory Visit: Payer: Self-pay

## 2018-12-09 VITALS — BP 116/83 | HR 94 | Temp 98.3°F | Wt 121.0 lb

## 2018-12-09 DIAGNOSIS — Z90711 Acquired absence of uterus with remaining cervical stump: Secondary | ICD-10-CM

## 2018-12-09 MED ORDER — OXYCODONE HCL 10 MG PO TABS: ORAL_TABLET | ORAL | 0 refills | Status: DC

## 2018-12-09 NOTE — Progress Notes (Signed)
   Subjective:    Patient ID: Makayla Fletcher, female    DOB: August 23, 1995, 23 y.o.   MRN: [id number]  HPI 22 yo engaged P25 (41 week old baby) here for an incision check. She had a NSVD and retained placeta followed by uterine inversion and [facility]. She is doing well. She needs more narcotics as she is not allowed to take tylenol or IBU due to elevated LFTs and creatinine. She is having drainage from the inferior aspect of her incision.   Review of Systems     Objective:   Physical Exam Breathing, conversing, and ambulating normally Well nourished, well hydrated Black female, no apparent distress Abd- benign I removed the steristrips and cleaned the scar There is a 1 cm open area at the bottom of the verticle scar I cleaned it and noted serous drainage, no sign of infection I packed it and taught her how to do it     Assessment & Plan:  Post op- stable Incision opening She will do dressing changes daily and come back next week for a re check. I have rechecked CBC and Cmeta and given her #20 oxycodones

## 2018-12-09 NOTE — Progress Notes (Signed)
Patient presents for incision check today. C-Section on 12/01/18  PP is scheduled for 12/22/18 with Dr.Davis.   CC: pt notes drainage pt states is still a little sore.

## 2018-12-10 LAB — COMPREHENSIVE METABOLIC PANEL
ALT: 21 IU/L (ref 0–32)
AST: 26 IU/L (ref 0–40)
Albumin/Globulin Ratio: 1 — ABNORMAL LOW (ref 1.2–2.2)
Albumin: 4 g/dL (ref 3.9–5.0)
Alkaline Phosphatase: 97 IU/L (ref 39–117)
BUN/Creatinine Ratio: 7 — ABNORMAL LOW (ref 9–23)
BUN: 6 mg/dL (ref 6–20)
Bilirubin Total: 1.6 mg/dL — ABNORMAL HIGH (ref 0.0–1.2)
CO2: 26 mmol/L (ref 20–29)
Calcium: 9.4 mg/dL (ref 8.7–10.2)
Chloride: 99 mmol/L (ref 96–106)
Creatinine, Ser: 0.81 mg/dL (ref 0.57–1.00)
GFR calc Af Amer: 118 mL/min/{1.73_m2} (ref >59)
GFR calc non Af Amer: 102 mL/min/{1.73_m2} (ref >59)
Globulin, Total: 4 g/dL (ref 1.5–4.5)
Glucose: 88 mg/dL (ref 65–99)
Potassium: 3.7 mmol/L (ref 3.5–5.2)
Sodium: 138 mmol/L (ref 134–144)
Total Protein: 8 g/dL (ref 6.0–8.5)

## 2018-12-10 LAB — CBC
Hematocrit: 31.8 % — ABNORMAL LOW (ref 34.0–46.6)
Hemoglobin: 10.6 g/dL — ABNORMAL LOW (ref 11.1–15.9)
MCH: 28.9 pg (ref 26.6–33.0)
MCHC: 33.3 g/dL (ref 31.5–35.7)
MCV: 87 fL (ref 79–97)
Platelets: 269 10*3/uL (ref 150–450)
RBC: 3.67 x10E6/uL — ABNORMAL LOW (ref 3.77–5.28)
RDW: 14.2 % (ref 11.7–15.4)
WBC: 5.5 10*3/uL (ref 3.4–10.8)

## 2018-12-12 ENCOUNTER — Ambulatory Visit: Payer: 59 | Admitting: Clinical

## 2018-12-12 ENCOUNTER — Other Ambulatory Visit: Payer: Self-pay

## 2018-12-12 NOTE — BH Specialist Note (Signed)
Pt did not arrive to [facility] video visit, and did not answer the phone. Left HIPPA-compliant message to call back Roselyn Reef from Center for [employer] at [phone], and left MyChart message for patient.   Integrated Behavioral Health Visit via [employer] video (Virtual)  12/12/2018 Cindie Laroche [phone]   Garlan Fair

## 2018-12-16 ENCOUNTER — Ambulatory Visit: Payer: 59

## 2018-12-21 ENCOUNTER — Ambulatory Visit: Payer: 59 | Admitting: Obstetrics & Gynecology

## 2018-12-22 ENCOUNTER — Ambulatory Visit (INDEPENDENT_AMBULATORY_CARE_PROVIDER_SITE_OTHER): Payer: 59 | Admitting: Obstetrics and Gynecology

## 2018-12-22 ENCOUNTER — Encounter: Payer: Self-pay | Admitting: Obstetrics and Gynecology

## 2018-12-22 ENCOUNTER — Other Ambulatory Visit: Payer: Self-pay

## 2018-12-22 VITALS — BP 116/79 | HR 101 | Wt 119.0 lb

## 2018-12-22 DIAGNOSIS — Z90711 Acquired absence of uterus with remaining cervical stump: Secondary | ICD-10-CM

## 2018-12-22 DIAGNOSIS — R10815 Periumbilic abdominal tenderness: Secondary | ICD-10-CM

## 2018-12-22 DIAGNOSIS — R140 Abdominal distension (gaseous): Secondary | ICD-10-CM

## 2018-12-22 NOTE — Progress Notes (Signed)
Obstetrics/Postpartum Visit  Appointment Date: 12/22/2018  OBGYN Clinic: [facility]  Primary Care Provider: Mike Gipouglas, Andre  Chief Complaint:  Chief Complaint  Patient presents with  . Postpartum Care    History of Present Illness: Makayla Fletcher is a 23 y.o. African-American [phone] (Patient's last menstrual period was 02/21/2018.), seen for the above chief complaint. Her past medical history is significant for sickle cell disease.  She is s/p spontaneous vaginal delivery, complicated by post partum hemorrhage, DIC and uterine inversion requiring ex lap, supracervical hysterectomy and cystoscopy on 11/23/18 at 39 weeks; she was discharged to home on POD#7. Pregnancy complicated by sickle cell disease.  Complains of soreness at left abdomen where she has a bulge. Reports bulge continues to be tender but is overall getting better. Reports sometimes it bulges out more than others, occasionally more sore than others. Denies any major issues, pain generally well controlled. Feels like she is coping well.   Vaginal bleeding or discharge: No  Breast or formula feeding: bottle Intercourse: No  Contraception: s/p hysterectomy PP depression s/s: No  Any bowel or bladder issues: No  Pap smear: no abnormalities (date: 04/2018)  Review of Systems: Positive for n/a.   Her 12 point review of systems is negative or as noted in the History of Present Illness.  Patient Active Problem List   Diagnosis Date Noted  . S/P postpartum abdominal supracervical hysterectomy 12/01/2018  . Postpartum hemorrhage 12/01/2018  . Hypovolemic shock (HCC) 12/01/2018  . Postoperative ileus (HCC) 12/01/2018  . AKI (acute kidney injury) (HCC) 11/23/2018  . Abnormal transaminases 11/23/2018  . Sickle cell disease (HCC) 11/22/2018  . Maternal sickle cell anemia affecting pregnancy, antepartum (HCC) 05/11/2018  . Supervision of normal first pregnancy, antepartum 05/04/2018  . Vitamin D deficiency 06/06/2015  .  Migraine without aura 12/18/2013  . 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 Sickle cell anemia (HCC) 06/17/2012  . Depression, major, single episode, moderate (HCC) 07/31/2011  . Generalized anxiety disorder 07/31/2011    Medications Makayla SpineAaliyah K. Janelle had no medications administered during this visit. Current Outpatient Medications  Medication Sig Dispense Refill  . Oxycodone HCl 10 MG TABS 1 pill po q 4 hours prn pain 20 tablet 0  . bisacodyl (DULCOLAX) 10 MG suppository Place 1 suppository (10 mg total) rectally as needed for moderate constipation or severe constipation. (Patient not taking: Reported on 12/09/2018) 12 suppository 2  . docusate sodium (COLACE) 100 MG capsule Take 1 capsule (100 mg total) by mouth 2 (two) times daily as needed for mild constipation or moderate constipation. (Patient not taking: Reported on 12/09/2018) 30 capsule 2  . ferrous sulfate 325 (65 FE) MG tablet TAKE 1 TABLET BY MOUTH TWICE A DAY 60 tablet 5  . folic acid (FOLVITE) 1 MG tablet TAKE 1 TABLET (1 MG TOTAL) BY MOUTH DAILY. (Patient not taking: Reported on 11/22/2018) 30 tablet 6  . polyethylene glycol (MIRALAX / GLYCOLAX) 17 g packet Take 17 g by mouth daily as needed for moderate constipation or severe constipation. (Patient not taking: Reported on 12/09/2018) 14 each 1  . prenatal vitamin w/FE, FA (PRENATAL 1 + 1) 27-1 MG TABS tablet Take 1 tablet by mouth daily at 12 noon.    . simethicone (MYLICON) 80 MG chewable tablet Chew 1 tablet (80 mg total) by mouth 4 (four) times daily as needed for flatulence. (Patient not taking: Reported on 12/09/2018) 30 tablet 0   No current facility-administered medications for this visit.  Allergies Patient has no known allergies.  Physical Exam:  BP 116/79   Pulse (!) 101   Wt 119 lb (54 kg)   LMP 02/21/2018   BMI 21.77 kg/m  Body mass index is 21.77 kg/m. General appearance: Well nourished, well developed female in no acute  distress.  Cardiovascular: regular rate and rhythm Respiratory:  Normal respiratory effort Abdomen: no masses, hernias; diffusely non tender to palpation, non distended, well healed vertical midline incision, 5 cm x 5 cm bulge at left aspect of incision that is mildly tender to palpation, this is where patient indicates it increases/decreases in size occasionally Breasts: not examined. Neuro/Psych:  Normal mood and affect.  Skin:  Warm and dry.   PP Depression Screening:   Edinburgh Postnatal Depression Scale - 12/22/18 0952      Edinburgh Postnatal Depression Scale:  In the Past 7 Days   I have been able to laugh and see the funny side of things.  0    I have looked forward with enjoyment to things.  0    I have blamed myself unnecessarily when things went wrong.  0    I have been anxious or worried for no good reason.  0    I have felt scared or panicky for no good reason.  0    Things have been getting on top of me.  0    I have been so unhappy that I have had difficulty sleeping.  0    I have felt sad or miserable.  0    I have been so unhappy that I have been crying.  0    The thought of harming myself has occurred to me.  0    Edinburgh Postnatal Depression Scale Total  0       Assessment: Patient is a 23 y.o. [id number] who is 4 weeks post partum from a spontaneous vaginal delivery c/b post partum hemorrhage, DIC, and uterine inversion requiring ex lap, supracervical hysterectomy and cystoscopy. She is doing remarkably well. Concern for bulge at left midline, could be tender due to healing process, however her description of it increasing/decreasing in size raises suspicion for hernia. See below  Plan:   1. S/P abdominal supracervical subtotal hysterectomy Patient doing remarkably well, healing well - seems to be doing well emotionally but reports she does get a lot of support from fiance and her mother - offered [facility] services as she had traumatic events including hysterectomy  surrounding her delivery and she is agreeable - Ambulatory referral to [place]  2. Periumbilical abdominal tenderness without rebound tenderness Bulge at left periumbilical area, could be tender due to healing process, however her description of it increasing/decreasing in size raises suspicion for hernia - will obtain CT to rule out ventral hernia - reviewed precautions for increased pain, nausea/vomiting, tenderness at site  3. Postpartum state Doing well, reports baby is doing well   RTC 4 weeks for follow up   K. Arvilla Meres, M.D. Attending Center for [employer] manager)

## 2019-01-03 ENCOUNTER — Ambulatory Visit (HOSPITAL_COMMUNITY): Admission: RE | Admit: 2019-01-03 | Payer: 59 | Source: Ambulatory Visit

## 2019-01-09 ENCOUNTER — Ambulatory Visit (HOSPITAL_COMMUNITY): Admission: RE | Admit: 2019-01-09 | Payer: 59 | Source: Ambulatory Visit

## 2019-01-11 NOTE — Addendum Note (Signed)
Addendum  created 01/11/19 1131 by Lucretia Kern D, CRNA   Intraprocedure Event edited

## 2019-01-16 ENCOUNTER — Other Ambulatory Visit: Payer: Self-pay

## 2019-01-16 ENCOUNTER — Telehealth: Payer: 59 | Admitting: Clinical

## 2019-01-16 DIAGNOSIS — Z91199 Patient's noncompliance with other medical treatment and regimen due to unspecified reason: Secondary | ICD-10-CM

## 2019-01-16 DIAGNOSIS — Z5329 Procedure and treatment not carried out because of patient's decision for other reasons: Secondary | ICD-10-CM

## 2019-01-16 NOTE — BH Specialist Note (Signed)
Error

## 2019-01-16 NOTE — Progress Notes (Addendum)
error 

## 2019-01-23 ENCOUNTER — Ambulatory Visit: Payer: Medicaid Other | Admitting: Family Medicine

## 2019-02-01 ENCOUNTER — Other Ambulatory Visit: Payer: Self-pay

## 2019-02-01 ENCOUNTER — Ambulatory Visit (INDEPENDENT_AMBULATORY_CARE_PROVIDER_SITE_OTHER): Payer: 59 | Admitting: Family Medicine

## 2019-02-01 ENCOUNTER — Encounter: Payer: Self-pay | Admitting: Family Medicine

## 2019-02-01 VITALS — BP 118/78 | HR 96 | Temp 98.7°F | Resp 14 | Ht 62.0 in | Wt 119.0 lb

## 2019-02-01 DIAGNOSIS — G8918 Other acute postprocedural pain: Secondary | ICD-10-CM | POA: Diagnosis not present

## 2019-02-01 DIAGNOSIS — D5700 Hb-SS disease with crisis, unspecified: Secondary | ICD-10-CM

## 2019-02-01 DIAGNOSIS — E559 Vitamin D deficiency, unspecified: Secondary | ICD-10-CM | POA: Diagnosis not present

## 2019-02-01 DIAGNOSIS — R1032 Left lower quadrant pain: Secondary | ICD-10-CM

## 2019-02-01 LAB — POCT URINALYSIS DIPSTICK
Bilirubin, UA: NEGATIVE
Glucose, UA: NEGATIVE
Ketones, UA: NEGATIVE
Nitrite, UA: NEGATIVE
Protein, UA: NEGATIVE
Spec Grav, UA: 1.02 (ref 1.010–1.025)
Urobilinogen, UA: 2 E.U./dL — AB
pH, UA: 6 (ref 5.0–8.0)

## 2019-02-01 MED ORDER — OXYCODONE HCL 5 MG PO TABS: 5.0000 mg | ORAL_TABLET | ORAL | 0 refills | Status: DC | PRN

## 2019-02-01 NOTE — Progress Notes (Signed)
PATIENT CARE CENTER INTERNAL MEDICINE AND SICKLE CELL CARE  SICKLE CELL ANEMIA FOLLOW UP VISIT PROVIDER: Lanae Boast, FNP    Subjective:   Makayla Fletcher  is a 23 y.o.  female who  has a past medical history of Depression, Migraine without aura, without mention of intractable migraine without mention of status migrainosus, Sickle cell anemia ([place]), and Urinary tract infection (July 2013). presents for a follow up for Sickle Cell Anemia. Since her last visit, she gave birth to a little girl. She had complications s/p delivery and had to have a hysterectomy. Patient states that she has been having lower abdominal pain to the lower left quadrant. The pain is sharp and intermittent. She does endorse wearing an abdominal binder. She states that she is upset about having a hysterectomy at a young age. She is coping as best she can. She has missed her appt with the LCSW. Discussed the importance of going to these appointments to help her manage and express her emotions.    Review of Systems  Constitutional: Negative.   HENT: Negative.   Eyes: Negative.   Respiratory: Negative.   Cardiovascular: Negative.   Gastrointestinal: Positive for abdominal pain.  Genitourinary: Negative.   Musculoskeletal: Negative.   Skin: Negative.   Neurological: Negative.   Psychiatric/Behavioral: Negative.     Objective:   Objective  BP 118/78 (BP Location: Right Arm, Patient Position: Sitting, Cuff Size: Normal)   Pulse 96   Temp 98.7 F (37.1 C) (Oral)   Resp 14   Ht 5\' 2"  (1.575 m)   Wt 119 lb (54 kg)   LMP 02/21/2018   SpO2 100%   BMI 21.77 kg/m   Wt Readings from Last 3 Encounters:  02/01/19 119 lb (54 kg)  12/22/18 119 lb (54 kg)  12/09/18 121 lb (54.9 kg)     Physical Exam Vitals signs and nursing note reviewed.  Constitutional:      General: She is not in acute distress.    Appearance: Normal appearance.  HENT:     Head: Normocephalic and atraumatic.  Eyes:     Extraocular  Movements: Extraocular movements intact.     Conjunctiva/sclera: Conjunctivae normal.     Pupils: Pupils are equal, round, and reactive to light.  Cardiovascular:     Rate and Rhythm: Normal rate and regular rhythm.     Heart sounds: No murmur.  Pulmonary:     Effort: Pulmonary effort is normal.     Breath sounds: Normal breath sounds.  Abdominal:     Comments: Well approximated surgical scar that appears to be fully healed. No erythema or warmth. Mild tenderness to palpation of the lower left quadrant.   Musculoskeletal: Normal range of motion.  Skin:    General: Skin is warm and dry.  Neurological:     Mental Status: She is alert and oriented to person, place, and time.  Psychiatric:        Mood and Affect: Mood normal.        Behavior: Behavior normal.        Thought Content: Thought content normal.        Judgment: Judgment normal.      Assessment/Plan:   Assessment   Encounter Diagnoses  Name Primary?  Marland Kitchen Hb-SS disease with crisis ([place]) Yes  . Vitamin D deficiency   . Postoperative left lower quadrant abdominal pain      Plan  1. Hb-SS disease with crisis ([place]) - Urinalysis Dipstick - oxyCODONE (ROXICODONE) 5 MG  immediate release tablet; Take 1 tablet (5 mg total) by mouth every 4 (four) hours as needed for severe pain.  Dispense: 30 tablet; Refill: 0 - Urine Culture  2. Vitamin D deficiency  3. Postoperative left lower quadrant abdominal pain She is being followed by OBGYN for this. Continue with binder. Discussed the importance of lifting properly and following up with therapist.     Return to care as scheduled and prn. Patient verbalized understanding and agreed with plan of care.   1. Sickle cell disease -  We discussed the need for good hydration, monitoring of hydration status, avoidance of heat, cold, stress, and infection triggers. We discussed the risks and benefits of Hydrea, including bone marrow suppression, the possibility of GI upset, skin ulcers,  hair thinning, and teratogenicity. The patient was reminded of the need to seek medical attention of any symptoms of bleeding, anemia, or infection. Continue folic acid 1 mg daily to prevent aplastic bone marrow crises.   2. Pulmonary evaluation - Patient denies severe recurrent wheezes, shortness of breath with exercise, or persistent cough. If these symptoms develop, pulmonary function tests with spirometry will be ordered, and if abnormal, plan on referral to Pulmonology for further evaluation.  3. Cardiac - Routine screening for pulmonary hypertension is not recommended.  4. Eye - High risk of proliferative retinopathy. Annual eye exam with retinal exam recommended to patient.  5. Immunization status -  Yearly influenza vaccination is recommended, as well as being up to date with Meningococcal and Pneumococcal vaccines.   6. Acute and chronic painful episodes - We discussed that pt is to receive Schedule II prescriptions only from us. Pt is also aware that the prescription history is available to us online through the [facility] CS[facility]. Controlled substance agreement signed. We reminded Sheran Spinealiyah K Geisen that all patients receiving Schedule II narcotics must be seen for follow within one month of prescription being requested. We reviewed the terms of our pain agreement, including the need to keep medicines in a safe locked location away from children or pets, and the need to report excess sedation or constipation, measures to avoid constipation, and policies related to early refills and stolen prescriptions. According to the [place] Chronic Pain Initiative program, we have reviewed details related to analgesia, adverse effects, aberrant behaviors.  7. Iron overload from chronic transfusion.  Not applicable at this time.  If this occurs will use Exjade for management.   8. Vitamin D deficiency - Drisdol 50,000 units weekly. Patient encouraged to take as prescribed.   The above recommendations are taken from the  NIH Evidence-Based Management of Sickle Cell Disease: Expert Panel Report, [id number].   Ms. Andr L. Riley Lamouglas, FNP-BC Patient Care Center [facility] [address]  [place], [place] [id number] [phone]  This note has been created with Dragon speech recognition software and smart phrase technology. Any transcriptional errors are unintentional.

## 2019-02-01 NOTE — Patient Instructions (Signed)
Sickle Cell Anemia, Adult ° °Sickle cell anemia is a condition where your red blood cells are shaped like sickles. Red blood cells carry oxygen through the body. Sickle-shaped cells do not live as long as normal red blood cells. They also clump together and block blood from flowing through the blood vessels. This prevents the body from getting enough oxygen. Sickle cell anemia causes organ damage and pain. It also increases the risk of infection. °Follow these instructions at home: °Medicines °· Take over-the-counter and prescription medicines only as told by your doctor. °· If you were prescribed an antibiotic medicine, take it as told by your doctor. Do not stop taking the antibiotic even if you start to feel better. °· If you develop a fever, do not take medicines to lower the fever right away. Tell your doctor about the fever. °Managing pain, stiffness, and swelling °· Try these methods to help with pain: °? Use a heating pad. °? Take a warm bath. °? Distract yourself, such as by watching TV. °Eating and drinking °· Drink enough fluid to keep your pee (urine) clear or pale yellow. Drink more in hot weather and during exercise. °· Limit or avoid alcohol. °· Eat a healthy diet. Eat plenty of fruits, vegetables, whole grains, and lean protein. °· Take vitamins and supplements as told by your doctor. °Traveling °· When traveling, keep these with you: °? Your medical information. °? The names of your doctors. °? Your medicines. °· If you need to take an airplane, talk to your doctor first. °Activity °· Rest often. °· Avoid exercises that make your heart beat much faster, such as jogging. °General instructions °· Do not use products that have nicotine or tobacco, such as cigarettes and e-cigarettes. If you need help quitting, ask your doctor. °· Consider wearing a medical alert bracelet. °· Avoid being in high places (high altitudes), such as mountains. °· Avoid very hot or cold temperatures. °· Avoid places where the  temperature changes a lot. °· Keep all follow-up visits as told by your doctor. This is important. °Contact a doctor if: °· A joint hurts. °· Your feet or hands hurt or swell. °· You feel tired (fatigued). °Get help right away if: °· You have symptoms of infection. These include: °? Fever. °? Chills. °? Being very tired. °? Irritability. °? Poor eating. °? Throwing up (vomiting). °· You feel dizzy or faint. °· You have new stomach pain, especially on the left side. °· You have a an erection (priapism) that lasts more than 4 hours. °· You have numbness in your arms or legs. °· You have a hard time moving your arms or legs. °· You have trouble talking. °· You have pain that does not go away when you take medicine. °· You are short of breath. °· You are breathing fast. °· You have a long-term cough. °· You have pain in your chest. °· You have a bad headache. °· You have a stiff neck. °· Your stomach looks bloated even though you did not eat much. °· Your skin is pale. °· You suddenly cannot see well. °Summary °· Sickle cell anemia is a condition where your red blood cells are shaped like sickles. °· Follow your doctor's advice on ways to manage pain, food to eat, activities to do, and steps to take for safe travel. °· Get medical help right away if you have any signs of infection, such as a fever. °This information is not intended to replace advice given to you by   your health care provider. Make sure you discuss any questions you have with your health care provider. °Document Released: 03/29/2013 Document Revised: 09/30/2018 Document Reviewed: 07/14/2016 °Elsevier Patient Education © 2020 Elsevier Inc. ° °

## 2019-02-03 LAB — URINE CULTURE

## 2019-02-13 ENCOUNTER — Telehealth (HOSPITAL_COMMUNITY): Payer: Self-pay | Admitting: Lactation Services

## 2019-02-13 NOTE — Telephone Encounter (Signed)
Mom delivered on 11/23/2018.  She had complications and had a hysterectomy following birth.  Mom has switched to formula and has questions about how to relactate.  She stopped pumping at least 4 weeks ago.  One week ago she started pumping with a manual pump and is obtaining drops.  She is also taking herbal supplements.  We discussed the possibility of obtaining a full milk supply would be difficult but she may be able to increase volume some.  Recommended she consider renting a hospital grade Symphony pump from the gift shop.  Mom is agreeable and will pump every 2-3 hours for 15-20 minutes.  Encouraged to call us with any questions or concerns.

## 2019-02-15 ENCOUNTER — Ambulatory Visit: Payer: 59 | Admitting: Obstetrics & Gynecology

## 2019-02-23 ENCOUNTER — Encounter: Payer: Self-pay | Admitting: Obstetrics and Gynecology

## 2019-02-23 ENCOUNTER — Other Ambulatory Visit: Payer: Self-pay

## 2019-02-23 ENCOUNTER — Ambulatory Visit (INDEPENDENT_AMBULATORY_CARE_PROVIDER_SITE_OTHER): Payer: 59 | Admitting: Obstetrics and Gynecology

## 2019-02-23 VITALS — BP 107/69 | HR 90 | Wt 117.8 lb

## 2019-02-23 DIAGNOSIS — Z9889 Other specified postprocedural states: Secondary | ICD-10-CM

## 2019-02-23 NOTE — Progress Notes (Signed)
23 yo s/p hysterectomy after vaginal delivery in June 2020 due to uterine inversion and postpartum hemorrhage here today for incision check. Patient reports noticing some drainage from her incision site of clear, or blood tinged fluid. Patient denies any fever, or abdominal pain  Past Medical History:  Diagnosis Date  . Depression   . Migraine without aura, without mention of intractable migraine without mention of status migrainosus   . Sickle cell anemia (HCC)   . Urinary tract infection July 2013   frequent    Past Surgical History:  Procedure Laterality Date  . ABDOMINAL HYSTERECTOMY N/A 11/23/2018   Procedure: HYSTERECTOMY ABDOMINAL;  Surgeon: Aletha Halim, MD;  Location: [facility];  Service: Gynecology;  Laterality: N/A;  . CYSTOSCOPY N/A 11/23/2018   Procedure: CYSTOSCOPY;  Surgeon: Aletha Halim, MD;  Location: [facility] ORS;  Service: Gynecology;  Laterality: N/A;  . DILATION AND CURETTAGE OF UTERUS N/A 11/23/2018   Procedure: DILATATION AND CURETTAGE;  Surgeon: Aletha Halim, MD;  Location: [facility] ORS;  Service: Gynecology;  Laterality: N/A;  . LAPAROTOMY N/A 11/23/2018   Procedure: EXPLORATORY LAPAROTOMY;  Surgeon: Aletha Halim, MD;  Location: [facility]S;  Service: Gynecology;  Laterality: N/A;  . NO PAST SURGERIES    . WISDOM TOOTH EXTRACTION     Family History  Problem Relation Age of Onset  . Migraines Father    Social History   Tobacco Use  . Smoking status: Never Smoker  . Smokeless tobacco: Never Used  Substance Use Topics  . Alcohol use: Not Currently    Comment: Wine  . Drug use: No   ROS See pertinent in HPI  Blood pressure 107/69, pulse 90, weight 117 lb 12.8 oz (53.4 kg), last menstrual period 02/21/2018, not currently breastfeeding. GENERAL: Well-developed, well-nourished female in no acute distress.  ABDOMEN: Soft, nontender, nondistended. No organomegaly. Incision: healed well with a 2 mm central defect oozing clear fluid with underlying induration  measuring 2 x 3 cm. No skin erythema or tenderness EXTREMITIES: No cyanosis, clubbing, or edema, 2+ distal pulses.  A/P 23 yo with wound defect - Patient rescheduled for CT scan - Patient will be contacted with results - RTC prn

## 2019-02-23 NOTE — Progress Notes (Signed)
Pt is here with c/o "two stiches sticking out of her incision" from having a hysterectomy back in 11/2018. Pt reports no other symptoms at this time.

## 2019-02-28 ENCOUNTER — Other Ambulatory Visit: Payer: Self-pay

## 2019-02-28 ENCOUNTER — Inpatient Hospital Stay (HOSPITAL_COMMUNITY)
Admission: AD | Admit: 2019-02-28 | Discharge: 2019-02-28 | Disposition: A | Discharge status: Home / Self Care | Payer: 59 | Attending: Obstetrics and Gynecology | Admitting: Obstetrics and Gynecology

## 2019-02-28 ENCOUNTER — Telehealth: Payer: Self-pay

## 2019-02-28 DIAGNOSIS — Z4800 Encounter for change or removal of nonsurgical wound dressing: Secondary | ICD-10-CM | POA: Diagnosis not present

## 2019-02-28 DIAGNOSIS — R109 Unspecified abdominal pain: Secondary | ICD-10-CM | POA: Insufficient documentation

## 2019-02-28 DIAGNOSIS — Z1389 Encounter for screening for other disorder: Secondary | ICD-10-CM | POA: Diagnosis not present

## 2019-02-28 NOTE — MAU Note (Signed)
Vag  6/3, hemorrhaged, had emergency c/s. Had something sticking out/poking her end of Aug, clear stitch. Went to office, scheduled for CT on 9/10.  Now there are 3 sticking out and there is some pus like drainage. No fever, area is tender to touch.

## 2019-02-28 NOTE — MAU Provider Note (Addendum)
First Provider Initiated Contact with Patient 02/28/19 1150      S [name] is a 23 y.o. [phone] non-pregnant female who presents to MAU today with complaint of abdominal pain. She is status post vaginal delivery and postpartum abdominal supracervical hysterectomy on 11/23/2018. She has been having issues with her incision since delivery and says that it has been opening and she notices a bulge near her umbilicus. She was told she needed a CT scan for a possible hernia. She called the office reporting an increase in pain at her incision site. She also noticed 3-4 poky stiches in the middle of her incision. She was instructed to come to MAU today to be evaluated.   O BP 118/73 (BP Location: Right Arm)   Pulse (!) 110   Temp 99 F (37.2 C) (Oral)   Resp 16   Wt 52.9 kg   LMP 02/21/2018   SpO2 100%   Breastfeeding No Comment: baby is doing well  BMI 21.33 kg/m  Physical Exam  Constitutional: She is oriented to person, place, and time. She appears well-developed and well-nourished.  Non-toxic appearance. She does not have a sickly appearance. She does not appear ill. No distress.  GI:  Patient lifted her to show me incision: 3 mm central open area near incision line oozing clear fluid. No skin erythema or tenderness. No odor. ?stitch protruding through the center of the wound.  Neurological: She is alert and oriented to person, place, and time.  Skin: She is not diaphoretic.    A Non pregnant female Medical screening exam complete Abdominal pain in female; no fever, no N/V Wound check   P Discharge from MAU in stable condition Patient given the option of transfer to [facility] for further evaluation or f/u with office to see if CT can be moved to a sooner time.  Patient unsure about waiting in the ED d/t Covid.  Message sent to the office for f/u.  Attempted to schedule her CT scan sooner however radiology scheduling stated that date and time is the soonest they were able to  do the scan Ok to take OTC ibuprofen and apply neosporin.  If pain worsens go to the [facility] ED.   Warning signs for worsening condition that would warrant emergency follow-up discussed Patient may return to MAU as needed for pregnancy related complaints   Maxximus Gotay, Artist Pais, NP 02/28/2019 4:34 PM

## 2019-02-28 NOTE — Telephone Encounter (Signed)
Patient was seen last week with c/o incision pain. She is scheduled for CT scan on 03/02/19. Pt called this morning and states that now 4 stitches are "poking through" and she is in significant pain. She would like to know what to do.

## 2019-02-28 NOTE — Telephone Encounter (Signed)
Called patient and advised her to go to MAU at this time, pt verbalizes understanding and says she will go now.

## 2019-02-28 NOTE — Telephone Encounter (Signed)
Called patient regarding her visit to MAU this afternoon. Pt reports she was unable to be seen due to being past 6 weeks PP. Pt reports she did not go to the ED, she was unsure of what to do. I advised patient that she can wait until her scheduled CT on 03/02/19 or she could go to the regular ED before then if she needs to based on her symptoms. Pt verbalizes understanding and said she will wait to see how she is feeling later this evening to make a decision. I advised patient to call us back if she has any further questions or concerns, pt verbalizes understanding.

## 2019-03-01 ENCOUNTER — Encounter (HOSPITAL_COMMUNITY): Payer: Self-pay

## 2019-03-01 ENCOUNTER — Encounter (HOSPITAL_COMMUNITY): Payer: Self-pay | Admitting: *Deleted

## 2019-03-02 ENCOUNTER — Other Ambulatory Visit: Payer: Self-pay

## 2019-03-02 ENCOUNTER — Ambulatory Visit: Payer: 59 | Admitting: Family Medicine

## 2019-03-02 ENCOUNTER — Ambulatory Visit (HOSPITAL_COMMUNITY)
Admission: RE | Admit: 2019-03-02 | Discharge: 2019-03-02 | Disposition: A | Discharge status: Home / Self Care | Payer: 59 | Source: Ambulatory Visit | Attending: Obstetrics and Gynecology | Admitting: Obstetrics and Gynecology

## 2019-03-02 DIAGNOSIS — R140 Abdominal distension (gaseous): Secondary | ICD-10-CM | POA: Insufficient documentation

## 2019-03-03 ENCOUNTER — Telehealth: Payer: Self-pay

## 2019-03-03 ENCOUNTER — Other Ambulatory Visit: Payer: Self-pay | Admitting: Obstetrics and Gynecology

## 2019-03-03 MED ORDER — CEPHALEXIN 500 MG PO CAPS: 500.0000 mg | ORAL_CAPSULE | Freq: Four times a day (QID) | ORAL | 0 refills | Status: DC

## 2019-03-03 NOTE — Telephone Encounter (Signed)
Pt called and would like to know the results of her CT scan and what the next steps will be. Pt verbalized concern with her job and her being out of work.

## 2019-03-07 ENCOUNTER — Other Ambulatory Visit: Payer: Self-pay

## 2019-03-07 ENCOUNTER — Ambulatory Visit (INDEPENDENT_AMBULATORY_CARE_PROVIDER_SITE_OTHER): Payer: 59 | Admitting: Obstetrics and Gynecology

## 2019-03-07 ENCOUNTER — Encounter: Payer: Self-pay | Admitting: Obstetrics and Gynecology

## 2019-03-07 VITALS — BP 116/78 | HR 92 | Wt 117.0 lb

## 2019-03-07 DIAGNOSIS — T8149XA Infection following a procedure, other surgical site, initial encounter: Secondary | ICD-10-CM

## 2019-03-07 DIAGNOSIS — K5900 Constipation, unspecified: Secondary | ICD-10-CM | POA: Diagnosis not present

## 2019-03-07 NOTE — Progress Notes (Signed)
GYNECOLOGY OFFICE FOLLOW UP NOTE  History:  23 y.o. [id number] here today for follow up for post op wound infection. Had been seen and CT ordered with concern for hernia. She [facility] CTR done which showed some fat stranding in subcutaneous tissue, keflex sent. She called for follow up stating incision was opening, therefor seen today.  States she has minimal pain, there is some drainage, overall feeling well. Irritated by suture at incision, minimal pain at incision site. Eating, drinking normally.     Past Medical History:  Diagnosis Date  . Depression   . Migraine without aura, without mention of intractable migraine without mention of status migrainosus   . Sickle cell anemia (HCC)   . Urinary tract infection July 2013   frequent     Past Surgical History:  Procedure Laterality Date  . ABDOMINAL HYSTERECTOMY N/A 11/23/2018   Procedure: HYSTERECTOMY ABDOMINAL;  Surgeon: [place] BingPickens, Charlie, MD;  Location: [facility];  Service: Gynecology;  Laterality: N/A;  . CYSTOSCOPY N/A 11/23/2018   Procedure: CYSTOSCOPY;  Surgeon: [place] BingPickens, Charlie, MD;  Location: [facility];  Service: Gynecology;  Laterality: N/A;  . DILATION AND CURETTAGE OF UTERUS N/A 11/23/2018   Procedure: DILATATION AND CURETTAGE;  Surgeon: [place] BingPickens, Charlie, MD;  Location: [facility];  Service: Gynecology;  Laterality: N/A;  . LAPAROTOMY N/A 11/23/2018   Procedure: EXPLORATORY LAPAROTOMY;  Surgeon: Candelaria Arenas BingPickens, Charlie, MD;  Location: [facility];  Service: Gynecology;  Laterality: N/A;  . NO PAST SURGERIES    . WISDOM TOOTH EXTRACTION       Current Outpatient Medications:  .  bisacodyl (DULCOLAX) 10 MG suppository, Place 1 suppository (10 mg total) rectally as needed for moderate constipation or severe constipation. (Patient not taking: Reported on 12/09/2018), Disp: 12 suppository, Rfl: 2 .  cephALEXin (KEFLEX) 500 MG capsule, Take 1 capsule (500 mg total) by mouth 4 (four) times daily.,  Disp: 28 capsule, Rfl: 0 .  docusate sodium (COLACE) 100 MG capsule, Take 1 capsule (100 mg total) by mouth 2 (two) times daily as needed for mild constipation or moderate constipation. (Patient not taking: Reported on 12/09/2018), Disp: 30 capsule, Rfl: 2 .  ferrous sulfate 325 (65 FE) MG tablet, TAKE 1 TABLET BY MOUTH TWICE A DAY, Disp: 60 tablet, Rfl: 5 .  folic acid (FOLVITE) 1 MG tablet, TAKE 1 TABLET (1 MG TOTAL) BY MOUTH DAILY. (Patient not taking: Reported on 11/22/2018), Disp: 30 tablet, Rfl: 6 .  oxyCODONE (ROXICODONE) 5 MG immediate release tablet, Take 1 tablet (5 mg total) by mouth every 4 (four) hours as needed for severe pain. (Patient not taking: Reported on 02/23/2019), Disp: 30 tablet, Rfl: 0 .  polyethylene glycol (MIRALAX / GLYCOLAX) 17 g packet, Take 17 g by mouth daily as needed for moderate constipation or severe constipation. (Patient not taking: Reported on 12/09/2018), Disp: 14 each, Rfl: 1 .  prenatal vitamin w/FE, FA (PRENATAL 1 + 1) 27-1 MG TABS tablet, Take 1 tablet by mouth daily at 12 noon., Disp: , Rfl:  .  simethicone (MYLICON) 80 MG chewable tablet, Chew 1 tablet (80 mg total) by mouth 4 (four) times daily as needed for flatulence. (Patient not taking: Reported on 02/23/2019), Disp: 30 tablet, Rfl: 0  The following portions of the patient's history were reviewed and updated as appropriate: allergies, current medications, past family history, past medical history, past social history, past surgical history and problem list.   Review  of Systems:  Pertinent items noted in HPI and remainder of comprehensive ROS otherwise negative.   Objective:  Physical Exam BP 116/78   Pulse 92   Wt 117 lb (53.1 kg)   LMP 02/21/2018   BMI 21.40 kg/m  CONSTITUTIONAL: Well-developed, well-nourished female in no acute distress.  HENT:  Normocephalic, atraumatic. External right and left ear normal. Oropharynx is clear and moist EYES: Conjunctivae and EOM are normal. Pupils are equal,  round, and reactive to light. No scleral icterus.  NECK: Normal range of motion, supple, no masses SKIN: Skin is warm and dry. No rash noted. Not diaphoretic. No erythema. No pallor. NEUROLOGIC: Alert and oriented to person, place, and time. Normal reflexes, muscle tone coordination. No cranial nerve deficit noted. PSYCHIATRIC: Normal mood and affect. Normal behavior. Normal judgment and thought content. CARDIOVASCULAR: Normal heart rate noted RESPIRATORY: Effort normal, no problems with respiration noted ABDOMEN: Soft, no distention noted. Well healed midline vertical incision with 0.5 cm open area, approx 4 mm deep. Small amount pus excised from site, no tenderness, erythema or other concern for infection, 4 monocryl sutures trimmed PELVIC: deferred MUSCULOSKELETAL: Normal range of motion. No edema noted.  Exam done with chaperone present.  Labs and Imaging   Assessment & Plan:  \ 1. Wound infection after surgery Suture trimmed - Wound culture sent - not deep enough to require wound packing, instructions for wound care given - cont keflex previously sent - f/u one week  2. Constipation, unspecified constipation type High fiber diet  Routine preventative health maintenance measures emphasized. Please refer to After Visit Summary for other counseling recommendations.   Return in about 1 week (around 03/14/2019) for in person, Followup.  Total face-to-face time with patient: 20 minutes. Over 50% of encounter was spent on counseling and coordination of care.  Feliz Beam, M.D. Attending Center for [employer] [facility])

## 2019-03-07 NOTE — Patient Instructions (Signed)

## 2019-03-10 ENCOUNTER — Non-Acute Institutional Stay (HOSPITAL_COMMUNITY)
Admission: AD | Admit: 2019-03-10 | Discharge: 2019-03-10 | Disposition: A | Discharge status: Home / Self Care | Payer: 59 | Source: Ambulatory Visit | Attending: Internal Medicine | Admitting: Internal Medicine

## 2019-03-10 ENCOUNTER — Other Ambulatory Visit: Payer: Self-pay | Admitting: Family Medicine

## 2019-03-10 ENCOUNTER — Encounter (HOSPITAL_COMMUNITY): Payer: Self-pay | Admitting: General Practice

## 2019-03-10 ENCOUNTER — Telehealth (HOSPITAL_COMMUNITY): Payer: Self-pay | Admitting: *Deleted

## 2019-03-10 DIAGNOSIS — D5700 Hb-SS disease with crisis, unspecified: Secondary | ICD-10-CM

## 2019-03-10 DIAGNOSIS — Z79899 Other long term (current) drug therapy: Secondary | ICD-10-CM | POA: Insufficient documentation

## 2019-03-10 DIAGNOSIS — G43909 Migraine, unspecified, not intractable, without status migrainosus: Secondary | ICD-10-CM | POA: Insufficient documentation

## 2019-03-10 DIAGNOSIS — F329 Major depressive disorder, single episode, unspecified: Secondary | ICD-10-CM | POA: Diagnosis not present

## 2019-03-10 DIAGNOSIS — R161 Splenomegaly, not elsewhere classified: Secondary | ICD-10-CM | POA: Insufficient documentation

## 2019-03-10 LAB — CBC WITH DIFFERENTIAL/PLATELET
Abs Immature Granulocytes: 0.05 10*3/uL (ref 0.00–0.07)
Basophils Absolute: 0 10*3/uL (ref 0.0–0.1)
Basophils Relative: 0 %
Eosinophils Absolute: 0.1 10*3/uL (ref 0.0–0.5)
Eosinophils Relative: 1 %
HCT: 29.6 % — ABNORMAL LOW (ref 36.0–46.0)
Hemoglobin: 10.8 g/dL — ABNORMAL LOW (ref 12.0–15.0)
Immature Granulocytes: 1 %
Lymphocytes Relative: 25 %
Lymphs Abs: 1.2 10*3/uL (ref 0.7–4.0)
MCH: 30.5 pg (ref 26.0–34.0)
MCHC: 36.5 g/dL — ABNORMAL HIGH (ref 30.0–36.0)
MCV: 83.6 fL (ref 80.0–100.0)
Monocytes Absolute: 0.2 10*3/uL (ref 0.1–1.0)
Monocytes Relative: 5 %
Neutro Abs: 3.3 10*3/uL (ref 1.7–7.7)
Neutrophils Relative %: 68 %
Platelets: 103 10*3/uL — ABNORMAL LOW (ref 150–400)
RBC: 3.54 MIL/uL — ABNORMAL LOW (ref 3.87–5.11)
RDW: 13.6 % (ref 11.5–15.5)
WBC: 4.9 10*3/uL (ref 4.0–10.5)
nRBC: 0 % (ref 0.0–0.2)

## 2019-03-10 LAB — WOUND CULTURE: Organism ID, Bacteria: NONE SEEN

## 2019-03-10 LAB — COMPREHENSIVE METABOLIC PANEL
ALT: 13 U/L (ref 0–44)
AST: 20 U/L (ref 15–41)
Albumin: 4.5 g/dL (ref 3.5–5.0)
Alkaline Phosphatase: 57 U/L (ref 38–126)
Anion gap: 8 (ref 5–15)
BUN: <5 mg/dL — ABNORMAL LOW (ref 6–20)
CO2: 26 mmol/L (ref 22–32)
Calcium: 9.4 mg/dL (ref 8.9–10.3)
Chloride: 107 mmol/L (ref 98–111)
Creatinine, Ser: 0.54 mg/dL (ref 0.44–1.00)
GFR calc Af Amer: >60 mL/min (ref >60)
GFR calc non Af Amer: >60 mL/min (ref >60)
Glucose, Bld: 88 mg/dL (ref 70–99)
Potassium: 3.7 mmol/L (ref 3.5–5.1)
Sodium: 141 mmol/L (ref 135–145)
Total Bilirubin: 1.1 mg/dL (ref 0.3–1.2)
Total Protein: 7.9 g/dL (ref 6.5–8.1)

## 2019-03-10 LAB — LACTATE DEHYDROGENASE: LDH: 176 U/L (ref 98–192)

## 2019-03-10 LAB — RETICULOCYTES
Immature Retic Fract: 25.5 % — ABNORMAL HIGH (ref 2.3–15.9)
RBC.: 3.54 MIL/uL — ABNORMAL LOW (ref 3.87–5.11)
Retic Count, Absolute: 112.9 10*3/uL (ref 19.0–186.0)
Retic Ct Pct: 3.2 % — ABNORMAL HIGH (ref 0.4–3.1)

## 2019-03-10 MED ORDER — SODIUM CHLORIDE 0.9 % IV SOLN
25.0000 mg | INTRAVENOUS | Status: DC | PRN
  Filled 2019-03-10: qty 0.5

## 2019-03-10 MED ORDER — DEXTROSE-NACL 5-0.45 % IV SOLN
INTRAVENOUS | Status: DC
  Administered 2019-03-10: 12:00:00 via INTRAVENOUS

## 2019-03-10 MED ORDER — SENNOSIDES-DOCUSATE SODIUM 8.6-50 MG PO TABS: 1.0000 | ORAL_TABLET | Freq: Two times a day (BID) | ORAL | Status: DC

## 2019-03-10 MED ORDER — ONDANSETRON HCL 4 MG/2ML IJ SOLN
4.0000 mg | Freq: Four times a day (QID) | INTRAMUSCULAR | Status: DC | PRN
  Administered 2019-03-10: 13:00:00 4 mg via INTRAVENOUS
  Filled 2019-03-10: qty 2

## 2019-03-10 MED ORDER — OXYCODONE HCL 5 MG PO TABS: 5.0000 mg | ORAL_TABLET | ORAL | 0 refills | Status: DC | PRN

## 2019-03-10 MED ORDER — DIPHENHYDRAMINE HCL 25 MG PO CAPS
25.0000 mg | ORAL_CAPSULE | ORAL | Status: DC | PRN
  Administered 2019-03-10: 16:00:00 25 mg via ORAL
  Filled 2019-03-10: qty 1

## 2019-03-10 MED ORDER — NALOXONE HCL 0.4 MG/ML IJ SOLN: 0.4000 mg | INTRAMUSCULAR | Status: DC | PRN

## 2019-03-10 MED ORDER — HYDROMORPHONE 1 MG/ML IV SOLN
INTRAVENOUS | Status: DC
  Administered 2019-03-10: 30 mg via INTRAVENOUS
  Administered 2019-03-10: 5.5 mg via INTRAVENOUS
  Filled 2019-03-10: qty 30

## 2019-03-10 MED ORDER — KETOROLAC TROMETHAMINE 15 MG/ML IJ SOLN
15.0000 mg | Freq: Four times a day (QID) | INTRAMUSCULAR | Status: DC
  Administered 2019-03-10: 15 mg via INTRAVENOUS
  Filled 2019-03-10: qty 1

## 2019-03-10 MED ORDER — POLYETHYLENE GLYCOL 3350 17 G PO PACK: 17.0000 g | PACK | Freq: Every day | ORAL | Status: DC | PRN

## 2019-03-10 MED ORDER — SODIUM CHLORIDE 0.9% FLUSH: 9.0000 mL | INTRAVENOUS | Status: DC | PRN

## 2019-03-10 NOTE — Telephone Encounter (Signed)
Patient called requesting to come to the day hospital for sickle cell pain. Patient reports back pain rated 10/10.  Reports taking Ibuprofen this morning at 7:00 and Oxycodone yesterday.  COVID-19 screening done and patient denies all symptoms. Denies fever, chest pain, nausea, vomiting, diarrhea and abdominal pain. Admits to having transportation at discharge without driving self. Dr. Doreene Burke notified. Patient can come to the day hospital for pain management. Patient advised and expresses an understanding.

## 2019-03-10 NOTE — Discharge Instructions (Signed)
Sickle Cell Anemia, Adult ° °Sickle cell anemia is a condition where your red blood cells are shaped like sickles. Red blood cells carry oxygen through the body. Sickle-shaped cells do not live as long as normal red blood cells. They also clump together and block blood from flowing through the blood vessels. This prevents the body from getting enough oxygen. Sickle cell anemia causes organ damage and pain. It also increases the risk of infection. °Follow these instructions at home: °Medicines °· Take over-the-counter and prescription medicines only as told by your doctor. °· If you were prescribed an antibiotic medicine, take it as told by your doctor. Do not stop taking the antibiotic even if you start to feel better. °· If you develop a fever, do not take medicines to lower the fever right away. Tell your doctor about the fever. °Managing pain, stiffness, and swelling °· Try these methods to help with pain: °? Use a heating pad. °? Take a warm bath. °? Distract yourself, such as by watching TV. °Eating and drinking °· Drink enough fluid to keep your pee (urine) clear or pale yellow. Drink more in hot weather and during exercise. °· Limit or avoid alcohol. °· Eat a healthy diet. Eat plenty of fruits, vegetables, whole grains, and lean protein. °· Take vitamins and supplements as told by your doctor. °Traveling °· When traveling, keep these with you: °? Your medical information. °? The names of your doctors. °? Your medicines. °· If you need to take an airplane, talk to your doctor first. °Activity °· Rest often. °· Avoid exercises that make your heart beat much faster, such as jogging. °General instructions °· Do not use products that have nicotine or tobacco, such as cigarettes and e-cigarettes. If you need help quitting, ask your doctor. °· Consider wearing a medical alert bracelet. °· Avoid being in high places (high altitudes), such as mountains. °· Avoid very hot or cold temperatures. °· Avoid places where the  temperature changes a lot. °· Keep all follow-up visits as told by your doctor. This is important. °Contact a doctor if: °· A joint hurts. °· Your feet or hands hurt or swell. °· You feel tired (fatigued). °Get help right away if: °· You have symptoms of infection. These include: °? Fever. °? Chills. °? Being very tired. °? Irritability. °? Poor eating. °? Throwing up (vomiting). °· You feel dizzy or faint. °· You have new stomach pain, especially on the left side. °· You have a an erection (priapism) that lasts more than 4 hours. °· You have numbness in your arms or legs. °· You have a hard time moving your arms or legs. °· You have trouble talking. °· You have pain that does not go away when you take medicine. °· You are short of breath. °· You are breathing fast. °· You have a long-term cough. °· You have pain in your chest. °· You have a bad headache. °· You have a stiff neck. °· Your stomach looks bloated even though you did not eat much. °· Your skin is pale. °· You suddenly cannot see well. °Summary °· Sickle cell anemia is a condition where your red blood cells are shaped like sickles. °· Follow your doctor's advice on ways to manage pain, food to eat, activities to do, and steps to take for safe travel. °· Get medical help right away if you have any signs of infection, such as a fever. °This information is not intended to replace advice given to you by   your health care provider. Make sure you discuss any questions you have with your health care provider. °Document Released: 03/29/2013 Document Revised: 09/30/2018 Document Reviewed: 07/14/2016 °Elsevier Patient Education © 2020 [employer] ° °

## 2019-03-10 NOTE — Progress Notes (Addendum)
Patient admitted to the day infusion hospital for sickle cell pain crisis. Initially, patient reported back pain rated 9/10. For pain management, patient placed on Dilaudid PCA, given 15 mg Toradol and hydrated with IV fluids. At discharge, patient rated pain at 4/10. Vital signs stable. Discharge instructions given. Patient alert, oriented and ambulatory at discharge.

## 2019-03-10 NOTE — Discharge Summary (Signed)
Physician Discharge Summary  Makayla Spinealiyah K Hubert [id number] DOB: 09-Mar-1996 DOA: 03/10/2019  PCP: Mike Gipouglas, Andre, FNP  Admit date: 03/10/2019  Discharge date: 03/10/2019  Time spent: 30 minutes  Discharge Diagnoses:  Active Problems:   Sickle cell anemia with crisis [facility](HCC)  Discharge Condition: Stable  Diet recommendation: Regular  History of present illness:  Makayla Fletcher is a 23 y.o. female with history of sickle cell disease, HbSC who presents to the day hospital today with major complaint of pain to her bilateral lower extremities and lower back consistent with her usual sickle cell pain crisis. She rates her pain at 10/10, characterized as constant and throbbing. No known aggravating nor relieving factors. She denies any fever, chest pain, cough, shortness of breath, recent travel, or contact with COVID-19 patient.  She also denies any urinary symptoms, nausea, vomiting or diarrhea.  Hospital Course:  Makayla Spinealiyah K Bos was admitted to the day hospital with sickle cell painful crisis. Patient was treated with weight based IV Dilaudid PCA, IV Toradol, clinician assisted doses as deemed appropriate and IV fluids. Tyisha showed significant improvement symptomatically, pain improved from 10 to 4/10 at the time of discharge. Patient was discharged home in a hemodynamically stable condition. Achille Richaliyah will follow-up at the clinic as previously scheduled, continue with home medications as per prior to admission. Her CBC, CMP and urinalysis were within normal  Discharge Instructions We discussed the need for good hydration, monitoring of hydration status, avoidance of heat, cold, stress, and infection triggers. We discussed the need to be compliant with taking Hydrea and other home medications. Achille Richaliyah was reminded of the need to seek medical attention immediately if any symptom of bleeding, anemia, or infection occurs.  Discharge Exam: Vitals:   03/10/19 1350 03/10/19 1545  BP: (!) 108/59  105/61  Pulse: 88 74  Resp: 15 18  Temp:  98 F (36.7 C)  SpO2: 97% 98%    General appearance: alert, cooperative and no distress Eyes: conjunctivae/corneas clear. PERRL, EOM's intact. Fundi benign. Neck: no adenopathy, no carotid bruit, no JVD, supple, symmetrical, trachea midline and thyroid not enlarged, symmetric, no tenderness/mass/nodules Back: symmetric, no curvature. ROM normal. No CVA tenderness. Resp: clear to auscultation bilaterally Chest wall: no tenderness Cardio: regular rate and rhythm, S1, S2 normal, no murmur, click, rub or gallop GI: soft, non-tender; bowel sounds normal; no masses,  no organomegaly Extremities: extremities normal, atraumatic, no cyanosis or edema Pulses: 2+ and symmetric Skin: Skin color, texture, turgor normal. No rashes or lesions Neurologic: Grossly normal  Discharge Instructions    Diet - low sodium heart healthy   Complete by: As directed    Increase activity slowly   Complete by: As directed      Allergies as of 03/10/2019   No Known Allergies     Medication List    TAKE these medications   bisacodyl 10 MG suppository Commonly known as: Dulcolax Place 1 suppository (10 mg total) rectally as needed for moderate constipation or severe constipation.   cephALEXin 500 MG capsule Commonly known as: KEFLEX Take 1 capsule (500 mg total) by mouth 4 (four) times daily.   docusate sodium 100 MG capsule Commonly known as: COLACE Take 1 capsule (100 mg total) by mouth 2 (two) times daily as needed for mild constipation or moderate constipation.   ferrous sulfate 325 (65 FE) MG tablet TAKE 1 TABLET BY MOUTH TWICE A DAY   folic acid 1 MG tablet Commonly known as: FOLVITE TAKE 1 TABLET (1 MG TOTAL) BY  MOUTH DAILY.   oxyCODONE 5 MG immediate release tablet Commonly known as: Roxicodone Take 1 tablet (5 mg total) by mouth every 4 (four) hours as needed for up to 15 days for severe pain.   polyethylene glycol 17 g packet Commonly known  as: MIRALAX / GLYCOLAX Take 17 g by mouth daily as needed for moderate constipation or severe constipation.   prenatal vitamin w/FE, FA 27-1 MG Tabs tablet Take 1 tablet by mouth daily at 12 noon.   simethicone 80 MG chewable tablet Commonly known as: MYLICON Chew 1 tablet (80 mg total) by mouth 4 (four) times daily as needed for flatulence.      No Known Allergies Follow-up Information    Lanae Boast, FNP. Call in 1 week(s).   Specialty: Family Medicine Contact information: [place] [address] [phone]           Significant Diagnostic Studies: Ct Abdomen Pelvis Wo Contrast  Result Date: 03/02/2019 CLINICAL DATA:  Abdominal pain and distention. Tender palpable abnormality near incision site. 3 months postop from supracervical hysterectomy. Sickle cell disease. EXAM: CT ABDOMEN AND PELVIS WITHOUT CONTRAST TECHNIQUE: Multidetector CT imaging of the abdomen and pelvis was performed following the standard protocol without IV contrast. COMPARISON:  11/23/2018 FINDINGS: Lower chest: No acute findings. Hepatobiliary: No mass visualized on this unenhanced exam. Gallbladder is unremarkable. No evidence of biliary ductal dilatation. Pancreas: No mass or inflammatory process visualized on this unenhanced exam. Spleen:  Stable moderate splenomegaly. Adrenals/Urinary tract: No evidence of urolithiasis or hydronephrosis. Stomach/Bowel: No evidence of obstruction, inflammatory process, or abnormal fluid collections. Vascular/Lymphatic: No pathologically enlarged lymph nodes identified. No evidence of abdominal aortic aneurysm. Reproductive: Expected postop changes from supracervical hysterectomy. No evidence of mass, inflammatory process, or abnormal fluid collections. Hemoperitoneum has resolved since previous study. Large colonic stool burden is noted. Other: Soft tissue stranding is seen in the subcutaneous fat near the umbilicus, however there is no evidence of  hernia, mass, or fluid collection. Musculoskeletal:  No suspicious bone lesions identified. IMPRESSION: 1. Expected postop changes from supracervical hysterectomy. Mild stranding in subcutaneous fat near the umbilicus is nonspecific and could be due to fibrosis or cellulitis. No evidence of hernia, mass, or fluid collection. 2. Large stool burden noted; recommend clinical correlation for possible constipation. 3. Stable moderate splenomegaly. Electronically Signed   By: Marlaine Hind M.D.   On: 03/02/2019 17:20    Signed:  Angelica Chessman MD, [place], Karilyn Cota, CPE   03/10/2019, 3:57 PM

## 2019-03-10 NOTE — H&P (Signed)
[facility] History and Physical  Makayla Fletcher [id number] DOB: 13-Nov-1995 DOA: 03/10/2019  PCP: Mike Gipouglas, Andre, FNP   Chief Complaint: Sickle Cell Pain  HPI: Makayla Fletcher is a 23 y.o. female with history of sickle cell disease, HbSC who presents to the day hospital today with major complaint of pain to her bilateral lower extremities and lower back consistent with her usual sickle cell pain crisis. She rates her pain at 10/10, characterized as constant and throbbing. No known aggravating nor relieving factors. She denies any fever, chest pain, cough, shortness of breath, recent travel, or contact with COVID-19 patient.  She also denies any urinary symptoms, nausea, vomiting or diarrhea.  Systemic Review: General: The patient denies anorexia, fever, weight loss Cardiac: Denies chest pain, syncope, palpitations, pedal edema  Respiratory: Denies cough, shortness of breath, wheezing GI: Denies severe indigestion/heartburn, abdominal pain, nausea, vomiting, diarrhea and constipation GU: Denies hematuria, incontinence, dysuria  Musculoskeletal: Denies arthritis  Skin: Denies suspicious skin lesions Neurologic: Denies focal weakness or numbness, change in vision  Past Medical History:  Diagnosis Date  . Depression   . Migraine without aura, without mention of intractable migraine without mention of status migrainosus   . Sickle cell anemia (HCC)   . Urinary tract infection July 2013   frequent     Past Surgical History:  Procedure Laterality Date  . ABDOMINAL HYSTERECTOMY N/A 11/23/2018   Procedure: HYSTERECTOMY ABDOMINAL;  Surgeon: [place] BingPickens, Charlie, MD;  Location: [facility];  Service: Gynecology;  Laterality: N/A;  . CYSTOSCOPY N/A 11/23/2018   Procedure: CYSTOSCOPY;  Surgeon: [place] BingPickens, Charlie, MD;  Location: [facility];  Service: Gynecology;  Laterality: N/A;  . DILATION AND CURETTAGE OF UTERUS N/A 11/23/2018   Procedure: DILATATION AND CURETTAGE;  Surgeon: [place] BingPickens,  Charlie, MD;  Location: [facility];  Service: Gynecology;  Laterality: N/A;  . LAPAROTOMY N/A 11/23/2018   Procedure: EXPLORATORY LAPAROTOMY;  Surgeon:  BingPickens, Charlie, MD;  Location: [facility];  Service: Gynecology;  Laterality: N/A;  . NO PAST SURGERIES    . WISDOM TOOTH EXTRACTION      No Known Allergies  Family History  Problem Relation Age of Onset  . Migraines Father       Prior to Admission medications   Medication Sig Start Date End Date Taking? Authorizing Provider  bisacodyl (DULCOLAX) 10 MG suppository Place 1 suppository (10 mg total) rectally as needed for moderate constipation or severe constipation. Patient not taking: Reported on 12/09/2018 12/01/18   Anyanwu, Jethro BastosUgonna A, MD  cephALEXin (KEFLEX) 500 MG capsule Take 1 capsule (500 mg total) by mouth 4 (four) times daily. 03/03/19   Conan Bowensavis, Kelly M, MD  docusate sodium (COLACE) 100 MG capsule Take 1 capsule (100 mg total) by mouth 2 (two) times daily as needed for mild constipation or moderate constipation. Patient not taking: Reported on 12/09/2018 12/01/18   Tereso NewcomerAnyanwu, Ugonna A, MD  ferrous sulfate 325 (65 FE) MG tablet TAKE 1 TABLET BY MOUTH TWICE A DAY 11/23/18   Brock BadHarper, Charles A, MD  folic acid (FOLVITE) 1 MG tablet TAKE 1 TABLET (1 MG TOTAL) BY MOUTH DAILY. Patient not taking: Reported on 11/22/2018 03/08/17   Massie MaroonHollis, Lachina M, FNP  oxyCODONE (ROXICODONE) 5 MG immediate release tablet Take 1 tablet (5 mg total) by mouth every 4 (four) hours as needed for severe pain. Patient not taking: Reported on 02/23/2019 02/01/19   Mike Gipouglas, Andre, FNP  polyethylene glycol (MIRALAX / GLYCOLAX) 17 g packet Take 17 g by  mouth daily as needed for moderate constipation or severe constipation. Patient not taking: Reported on 12/09/2018 12/01/18   Osborne Oman, MD  prenatal vitamin w/FE, FA (PRENATAL 1 + 1) 27-1 MG TABS tablet Take 1 tablet by mouth daily at 12 noon.    [provider]  simethicone (MYLICON) 80 MG chewable tablet Chew 1  tablet (80 mg total) by mouth 4 (four) times daily as needed for flatulence. Patient not taking: Reported on 02/23/2019 12/01/18   Osborne Oman, MD     Physical Exam: There were no vitals filed for this visit.  General: Alert, awake, afebrile, anicteric, not in obvious distress HEENT: Normocephalic and Atraumatic, Mucous membranes pink                PERRLA; EOM intact; No scleral icterus,                 Nares: Patent, Oropharynx: Clear, Fair Dentition                 Neck: FROM, no cervical lymphadenopathy, thyromegaly, carotid bruit or JVD;  CHEST WALL: No tenderness  CHEST: Normal respiration, clear to auscultation bilaterally  HEART: Regular rate and rhythm; no murmurs rubs or gallops  BACK: No kyphosis or scoliosis; no CVA tenderness  ABDOMEN: Positive Bowel Sounds, soft, non-tender; no masses, no organomegaly EXTREMITIES: No cyanosis, clubbing, or edema SKIN:  no rash or ulceration  CNS: Alert and Oriented x 4, Nonfocal exam, CN 2-12 intact  Labs on Admission:  Basic Metabolic Panel: No results for input(s): NA, K, CL, CO2, GLUCOSE, BUN, CREATININE, CALCIUM, MG, PHOS in the last 168 hours. Liver Function Tests: No results for input(s): AST, ALT, ALKPHOS, BILITOT, PROT, ALBUMIN in the last 168 hours. No results for input(s): LIPASE, AMYLASE in the last 168 hours. No results for input(s): AMMONIA in the last 168 hours. CBC: No results for input(s): WBC, NEUTROABS, HGB, HCT, MCV, PLT in the last 168 hours. Cardiac Enzymes: No results for input(s): CKTOTAL, CKMB, CKMBINDEX, TROPONINI in the last 168 hours.  BNP (last 3 results) No results for input(s): BNP in the last 8760 hours.  ProBNP (last 3 results) No results for input(s): PROBNP in the last 8760 hours.  CBG: No results for input(s): GLUCAP in the last 168 hours.   Assessment/Plan Active Problems:   Sickle cell anemia with crisis ([place])   Admits to the Day Hospital  IVF D5 .45% Saline @ 125  mls/hour  Weight based Dilaudid PCA started within 30 minutes of admission  IV Toradol 15 mg Q 6 H  Monitor vitals very closely, Re-evaluate pain scale every hour  2 L of Oxygen by [place]  Patient will be re-evaluated for pain in the context of function and relationship to baseline as care progresses.  If no significant relieve from pain (remains above 5/10) will transfer patient to inpatient services for further evaluation and management  Code Status: Full  Family Communication: None  DVT Prophylaxis: Ambulate as tolerated   Time spent: 35 Minutes  Angelica Chessman, MD, MHA, FACP, FAAP, CPE  If 7PM-7AM, please contact night-coverage [email or web address] 03/10/2019, 10:52 AM

## 2019-03-10 NOTE — Progress Notes (Signed)
Refilled medication

## 2019-03-14 ENCOUNTER — Ambulatory Visit: Payer: 59 | Admitting: Women's Health

## 2019-04-11 ENCOUNTER — Telehealth: Payer: Self-pay

## 2019-04-11 NOTE — Telephone Encounter (Addendum)
Received VM from pt s/p hysterectomy in June after NSVD, c/o stitches coming apart and wound is open, denies pain and drainage. Appt scheduled tomorrow for an evaluation

## 2019-04-12 ENCOUNTER — Other Ambulatory Visit: Payer: Self-pay

## 2019-04-12 ENCOUNTER — Ambulatory Visit (INDEPENDENT_AMBULATORY_CARE_PROVIDER_SITE_OTHER): Payer: 59 | Admitting: Obstetrics

## 2019-04-12 ENCOUNTER — Encounter: Payer: Self-pay | Admitting: Obstetrics

## 2019-04-12 VITALS — BP 105/66 | HR 97 | Wt 116.0 lb

## 2019-04-12 DIAGNOSIS — T8149XA Infection following a procedure, other surgical site, initial encounter: Secondary | ICD-10-CM

## 2019-04-12 DIAGNOSIS — Z9889 Other specified postprocedural states: Secondary | ICD-10-CM | POA: Diagnosis not present

## 2019-04-12 DIAGNOSIS — Z90711 Acquired absence of uterus with remaining cervical stump: Secondary | ICD-10-CM

## 2019-04-12 NOTE — Progress Notes (Signed)
   GYNECOLOGY OFFICE VISIT NOTE  History:   Makayla Fletcher is a 23 y.o. [phone] here today for abdominal wound check, after a supracervical hysterectomy for postpartum hemorrhage after uterine inversion.  She has a small area of superficial incisional infection that was probably a stitch abscess that is being followed.  She has noted a small amount of brownish drainage from area.  She denies any abnormal vaginal discharge, bleeding, pelvic pain or other concerns.    Past Medical History:  Diagnosis Date  . Depression   . Migraine without aura, without mention of intractable migraine without mention of status migrainosus   . Sickle cell anemia (HCC)   . Urinary tract infection July 2013   frequent     Past Surgical History:  Procedure Laterality Date  . ABDOMINAL HYSTERECTOMY N/A 11/23/2018   Procedure: HYSTERECTOMY ABDOMINAL;  Surgeon: Aletha Halim, MD;  Location: [facility];  Service: Gynecology;  Laterality: N/A;  . CYSTOSCOPY N/A 11/23/2018   Procedure: CYSTOSCOPY;  Surgeon: Aletha Halim, MD;  Location: [facility];  Service: Gynecology;  Laterality: N/A;  . DILATION AND CURETTAGE OF UTERUS N/A 11/23/2018   Procedure: DILATATION AND CURETTAGE;  Surgeon: Aletha Halim, MD;  Location: [facility];  Service: Gynecology;  Laterality: N/A;  . LAPAROTOMY N/A 11/23/2018   Procedure: EXPLORATORY LAPAROTOMY;  Surgeon: Aletha Halim, MD;  Location: [facility];  Service: Gynecology;  Laterality: N/A;  . NO PAST SURGERIES    . WISDOM TOOTH EXTRACTION      The following portions of the patient's history were reviewed and updated as appropriate: allergies, current medications, past family history, past medical history, past social history, past surgical history and problem list.    Review of Systems:  Pertinent items noted in HPI and remainder of comprehensive ROS otherwise negative.  Physical Exam:  BP 105/66   Pulse 97   Wt 116 lb (52.6 kg)   LMP 02/21/2018   BMI 21.22 kg/m   CONSTITUTIONAL: Well-developed, well-nourished female in no acute distress.  HEENT:  Normocephalic, atraumatic. External right and left ear normal. No scleral icterus.  NECK: Normal range of motion, supple, no masses noted on observation SKIN: No rash noted. Not diaphoretic. No erythema. No pallor. MUSCULOSKELETAL: Normal range of motion. No edema noted. NEUROLOGIC: Alert and oriented to person, place, and time. Normal muscle tone coordination. No cranial nerve deficit noted. PSYCHIATRIC: Normal mood and affect. Normal behavior. Normal judgment and thought content. CARDIOVASCULAR: Normal heart rate noted RESPIRATORY: Effort and breath sounds normal, no problems with respiration noted ABDOMEN: Incision:  Small area of granulation tissue cauterized with silver nitrate. PELVIC: Deferred  Labs and Imaging No results found for this or any previous visit (from the past 168 hour(s)). No results found.     Assessment and Plan:    1. Post-operative state  2. S/P abdominal supracervical subtotal hysterectomy - doing well  3. Wound infection after surgery - healing well except for a small area of granulation tissue that was cauterized with silver nitrate   Routine preventative health maintenance measures emphasized. Please refer to After Visit Summary for other counseling recommendations.   Return in about 2 weeks (around 04/26/2019) for Follow up wound infection.    Total face-to-face time with patient: 30 minutes.  Over 50% of encounter was spent on counseling and coordination of care.   Shelly Bombard, MD, [place] for [facility], [facility] 04/12/2019

## 2019-04-26 ENCOUNTER — Other Ambulatory Visit: Payer: Self-pay

## 2019-04-26 ENCOUNTER — Ambulatory Visit (INDEPENDENT_AMBULATORY_CARE_PROVIDER_SITE_OTHER): Payer: 59 | Admitting: Obstetrics

## 2019-04-26 ENCOUNTER — Encounter: Payer: Self-pay | Admitting: Obstetrics

## 2019-04-26 VITALS — BP 109/68 | HR 107 | Ht 62.0 in | Wt 118.8 lb

## 2019-04-26 DIAGNOSIS — L929 Granulomatous disorder of the skin and subcutaneous tissue, unspecified: Secondary | ICD-10-CM

## 2019-04-26 DIAGNOSIS — T8149XA Infection following a procedure, other surgical site, initial encounter: Secondary | ICD-10-CM | POA: Diagnosis not present

## 2019-04-26 MED ORDER — AMOXICILLIN-POT CLAVULANATE 875-125 MG PO TABS: 1.0000 | ORAL_TABLET | Freq: Two times a day (BID) | ORAL | 0 refills | Status: DC

## 2019-04-26 NOTE — Progress Notes (Signed)
Patient is in the office for wound follow up after hysterectomy. Pt states that after visit on 04-12-19 wound seemed to be healing but then it started to look as if it was opening back up and has a "snotty" looking drainage and odor.

## 2019-04-27 ENCOUNTER — Encounter: Payer: Self-pay | Admitting: Obstetrics

## 2019-04-27 NOTE — Progress Notes (Signed)
Patient ID: Makayla Fletcher, female   DOB: [birth year], 23 y.o.   MRN: [id number]  Chief Complaint  Patient presents with  . Follow up    HPI Makayla NIELSON is a 23 y.o. female.  Presents for follow up for a small non healing area in her incision that appears to be granulation tissue that was cauterized on last visit to the office. HPI  Past Medical History:  Diagnosis Date  . Depression   . Migraine without aura, without mention of intractable migraine without mention of status migrainosus   . Sickle cell anemia (HCC)   . Urinary tract infection July 2013   frequent     Past Surgical History:  Procedure Laterality Date  . ABDOMINAL HYSTERECTOMY N/A 11/23/2018   Procedure: HYSTERECTOMY ABDOMINAL;  Surgeon: Aletha Halim, MD;  [facility];  Service: Gynecology;  Laterality: N/A;  . CYSTOSCOPY N/A 11/23/2018   Procedure: CYSTOSCOPY;  Surgeon: Aletha Halim, MD;  [facility];  Service: Gynecology;  Laterality: N/A;  . DILATION AND CURETTAGE OF UTERUS N/A 11/23/2018   Procedure: DILATATION AND CURETTAGE;  Surgeon: Aletha Halim, MD;  [facility];  Service: Gynecology;  Laterality: N/A;  . LAPAROTOMY N/A 11/23/2018   Procedure: EXPLORATORY LAPAROTOMY;  Surgeon: Aletha Halim, MD;  [facility];  Service: Gynecology;  Laterality: N/A;  . NO PAST SURGERIES    . WISDOM TOOTH EXTRACTION      Family History  Problem Relation Age of Onset  . Migraines Father     Social History Social History   Tobacco Use  . Smoking status: Never Smoker  . Smokeless tobacco: Never Used  Substance Use Topics  . Alcohol use: Not Currently    Comment: Wine  . Drug use: No    No Known Allergies  Current Outpatient Medications  Medication Sig Dispense Refill  . amoxicillin-clavulanate (AUGMENTIN) 875-125 MG tablet Take 1 tablet by mouth 2 (two) times daily. 1 tablet po BID x10 days 14 tablet 0  . bisacodyl (DULCOLAX) 10 MG suppository Place 1 suppository  (10 mg total) rectally as needed for moderate constipation or severe constipation. (Patient not taking: Reported on 12/09/2018) 12 suppository 2  . docusate sodium (COLACE) 100 MG capsule Take 1 capsule (100 mg total) by mouth 2 (two) times daily as needed for mild constipation or moderate constipation. (Patient not taking: Reported on 12/09/2018) 30 capsule 2  . ferrous sulfate 325 (65 FE) MG tablet TAKE 1 TABLET BY MOUTH TWICE A DAY (Patient not taking: Reported on 04/12/2019) 60 tablet 5  . folic acid (FOLVITE) 1 MG tablet TAKE 1 TABLET (1 MG TOTAL) BY MOUTH DAILY. (Patient not taking: Reported on 11/22/2018) 30 tablet 6  . polyethylene glycol (MIRALAX / GLYCOLAX) 17 g packet Take 17 g by mouth daily as needed for moderate constipation or severe constipation. (Patient not taking: Reported on 12/09/2018) 14 each 1  . prenatal vitamin w/FE, FA (PRENATAL 1 + 1) 27-1 MG TABS tablet Take 1 tablet by mouth daily at 12 noon.     No current facility-administered medications for this visit.     Review of Systems Review of Systems Constitutional: negative for fatigue and weight loss Respiratory: negative for cough and wheezing Cardiovascular: negative for chest pain, fatigue and palpitations Gastrointestinal: negative for abdominal pain and change in bowel habits Genitourinary:negative Integument/breast: positive for incisional granulation tissue Musculoskeletal:negative for myalgias Neurological: negative for gait problems and tremors Behavioral/Psych: negative for abusive relationship, depression Endocrine: negative  for temperature intolerance      Blood pressure 109/68, pulse (!) 107, height 5\' 2"  (1.575 m), weight 118 lb 12.8 oz (53.9 kg), last menstrual period 02/21/2018, not currently breastfeeding.  Physical Exam Physical Exam General:   alert  Skin:    Small area of granulation tissue in incision, cauterized with silver nitrate     50% of 15 min visit spent on counseling and coordination  of care.   Data Reviewed Labs  Assessment     1. Wound infection after surgery Rx: - amoxicillin-clavulanate (AUGMENTIN) 875-125 MG tablet; Take 1 tablet by mouth 2 (two) times daily. 1 tablet po BID x10 days  Dispense: 14 tablet; Refill: 0  2. Postoperative granulation of tissue - cauterized with silver nitrate    Plan    Follow up in 2 weeks   Meds ordered this encounter  Medications  . amoxicillin-clavulanate (AUGMENTIN) 875-125 MG tablet    Sig: Take 1 tablet by mouth 2 (two) times daily. 1 tablet po BID x10 days    Dispense:  14 tablet    Refill:  0    04/23/2018, MD 04/27/2019 8:25 AM

## 2019-05-05 ENCOUNTER — Telehealth: Payer: Self-pay | Admitting: Internal Medicine

## 2019-05-05 NOTE — Telephone Encounter (Signed)
Refill request for oxycodone.  

## 2019-05-07 ENCOUNTER — Other Ambulatory Visit: Payer: Self-pay | Admitting: Internal Medicine

## 2019-05-07 DIAGNOSIS — D5700 Hb-SS disease with crisis, unspecified: Secondary | ICD-10-CM

## 2019-05-07 MED ORDER — OXYCODONE HCL 5 MG PO TABS: 5.0000 mg | ORAL_TABLET | ORAL | 0 refills | Status: DC | PRN

## 2019-05-07 NOTE — Telephone Encounter (Signed)
Refilled

## 2019-05-11 ENCOUNTER — Other Ambulatory Visit: Payer: Self-pay

## 2019-05-11 ENCOUNTER — Ambulatory Visit (INDEPENDENT_AMBULATORY_CARE_PROVIDER_SITE_OTHER): Payer: 59 | Admitting: Obstetrics and Gynecology

## 2019-05-11 ENCOUNTER — Encounter: Payer: Self-pay | Admitting: Obstetrics and Gynecology

## 2019-05-11 VITALS — BP 108/67 | HR 71 | Wt 118.0 lb

## 2019-05-11 DIAGNOSIS — T8149XA Infection following a procedure, other surgical site, initial encounter: Secondary | ICD-10-CM

## 2019-05-11 DIAGNOSIS — T8149XD Infection following a procedure, other surgical site, subsequent encounter: Secondary | ICD-10-CM

## 2019-05-11 NOTE — Progress Notes (Signed)
GYNECOLOGY OFFICE FOLLOW UP NOTE  History:  23 y.o. [id number] here today for follow up for wound checkup. S/p post partum hysterectomy 11/23/18 with infected wound post op. She is doing well, reports no pain, no issues with bathroom, denies nausea/vomiting. Overall, feels she is doing very well.  Past Medical History:  Diagnosis Date  . Depression   . Migraine without aura, without mention of intractable migraine without mention of status migrainosus   . Sickle cell anemia (HCC)   . Urinary tract infection July 2013   frequent     Past Surgical History:  Procedure Laterality Date  . ABDOMINAL HYSTERECTOMY N/A 11/23/2018   Procedure: HYSTERECTOMY ABDOMINAL;  Surgeon: [place] Bing, MD;  [facility]: [facility];  Service: Gynecology;  Laterality: N/A;  . CYSTOSCOPY N/A 11/23/2018   Procedure: CYSTOSCOPY;  Surgeon: [place] Bing, MD;  Location: [facility];  Service: Gynecology;  Laterality: N/A;  . DILATION AND CURETTAGE OF UTERUS N/A 11/23/2018   Procedure: DILATATION AND CURETTAGE;  Surgeon: [place] Bing, MD;  [facility]: [facility];  Service: Gynecology;  Laterality: N/A;  . LAPAROTOMY N/A 11/23/2018   Procedure: EXPLORATORY LAPAROTOMY;  Surgeon: [place] Bing, MD;  Location: [facility];  Service: Gynecology;  Laterality: N/A;  . NO PAST SURGERIES    . WISDOM TOOTH EXTRACTION       Current Outpatient Medications:  .  oxyCODONE (ROXICODONE) 5 MG immediate release tablet, Take 1 tablet (5 mg total) by mouth every 4 (four) hours as needed for up to 15 days for severe pain., Disp: 60 tablet, Rfl: 0 .  bisacodyl (DULCOLAX) 10 MG suppository, Place 1 suppository (10 mg total) rectally as needed for moderate constipation or severe constipation. (Patient not taking: Reported on 12/09/2018), Disp: 12 suppository, Rfl: 2 .  docusate sodium (COLACE) 100 MG capsule, Take 1 capsule (100 mg total) by mouth 2 (two) times daily as needed for mild constipation or moderate constipation. (Patient not taking:  Reported on 12/09/2018), Disp: 30 capsule, Rfl: 2 .  ferrous sulfate 325 (65 FE) MG tablet, TAKE 1 TABLET BY MOUTH TWICE A DAY (Patient not taking: Reported on 04/12/2019), Disp: 60 tablet, Rfl: 5 .  folic acid (FOLVITE) 1 MG tablet, TAKE 1 TABLET (1 MG TOTAL) BY MOUTH DAILY. (Patient not taking: Reported on 11/22/2018), Disp: 30 tablet, Rfl: 6 .  polyethylene glycol (MIRALAX / GLYCOLAX) 17 g packet, Take 17 g by mouth daily as needed for moderate constipation or severe constipation. (Patient not taking: Reported on 12/09/2018), Disp: 14 each, Rfl: 1 .  prenatal vitamin w/FE, FA (PRENATAL 1 + 1) 27-1 MG TABS tablet, Take 1 tablet by mouth daily at 12 noon., Disp: , Rfl:   The following portions of the patient's history were reviewed and updated as appropriate: allergies, current medications, past family history, past medical history, past social history, past surgical history and problem list.   Review of Systems:  Pertinent items noted in HPI and remainder of comprehensive ROS otherwise negative.   Objective:  Physical Exam BP 108/67   Pulse 71   Wt 118 lb (53.5 kg)   LMP 02/21/2018   BMI 21.58 kg/m  CONSTITUTIONAL: Well-developed, well-nourished female in no acute distress.  HENT:  Normocephalic, atraumatic. External right and left ear normal. Oropharynx is clear and moist EYES: Conjunctivae and EOM are normal. Pupils are equal, round, and reactive to light. No scleral icterus.  NECK: Normal range of motion, supple, no masses SKIN: Skin is warm and dry. No rash  noted. Not diaphoretic. No erythema. No pallor. NEUROLOGIC: Alert and oriented to person, place, and time. Normal reflexes, muscle tone coordination. No cranial nerve deficit noted. PSYCHIATRIC: Normal mood and affect. Normal behavior. Normal judgment and thought content. CARDIOVASCULAR: Normal heart rate noted RESPIRATORY: Effort normal, no problems with respiration noted ABDOMEN: Soft, no distention noted. Midline wound with 3 mm  scab where prior incision had not healed yet, no evidence of erythema, induration PELVIC: deferred MUSCULOSKELETAL: Normal range of motion. No edema noted.   Labs and Imaging No results found.  Assessment & Plan:   1. Wound infection after surgery Much improved since last visit Currently scabbed with no evidence of infection Cont current care Return 2 weeks for follow up, will likely be completely healed by then   Routine preventative health maintenance measures emphasized. Please refer to After Visit Summary for other counseling recommendations.   Return in about 2 weeks (around 05/25/2019) for Followup.  Total face-to-face time with patient: 10 minutes. Over 50% of encounter was spent on counseling and coordination of care.  Feliz Beam, M.D. Attending Center for [employer] farm manager)

## 2019-05-29 ENCOUNTER — Other Ambulatory Visit: Payer: Self-pay

## 2019-05-29 ENCOUNTER — Encounter: Payer: Self-pay | Admitting: Obstetrics and Gynecology

## 2019-05-29 ENCOUNTER — Ambulatory Visit (INDEPENDENT_AMBULATORY_CARE_PROVIDER_SITE_OTHER): Payer: 59 | Admitting: Obstetrics and Gynecology

## 2019-05-29 VITALS — BP 110/72 | HR 102 | Wt 118.0 lb

## 2019-05-29 DIAGNOSIS — T8149XD Infection following a procedure, other surgical site, subsequent encounter: Secondary | ICD-10-CM | POA: Diagnosis not present

## 2019-05-29 DIAGNOSIS — T8149XA Infection following a procedure, other surgical site, initial encounter: Secondary | ICD-10-CM

## 2019-05-29 DIAGNOSIS — Z9889 Other specified postprocedural states: Secondary | ICD-10-CM | POA: Diagnosis not present

## 2019-05-29 NOTE — Progress Notes (Signed)
GYNECOLOGY OFFICE FOLLOW UP NOTE  History:  23 y.o. [id number] here today for follow up for wound checkup. S/p post partum hysterectomy 11/23/18 with infected wound post op. She is doing very well, no issues.   Past Medical History:  Diagnosis Date  . Depression   . Migraine without aura, without mention of intractable migraine without mention of status migrainosus   . Sickle cell anemia (HCC)   . Urinary tract infection July 2013   frequent     Past Surgical History:  Procedure Laterality Date  . ABDOMINAL HYSTERECTOMY N/A 11/23/2018   Procedure: HYSTERECTOMY ABDOMINAL;  Surgeon: Aletha Halim, MD;  [facility];  Service: Gynecology;  Laterality: N/A;  . CYSTOSCOPY N/A 11/23/2018   Procedure: CYSTOSCOPY;  Surgeon: Aletha Halim, MD;  [facility];  Service: Gynecology;  Laterality: N/A;  . DILATION AND CURETTAGE OF UTERUS N/A 11/23/2018   Procedure: DILATATION AND CURETTAGE;  Surgeon: Aletha Halim, MD;  [facility];  Service: Gynecology;  Laterality: N/A;  . LAPAROTOMY N/A 11/23/2018   Procedure: EXPLORATORY LAPAROTOMY;  Surgeon: Aletha Halim, MD;  [facility]: [facility];  Service: Gynecology;  Laterality: N/A;  . NO PAST SURGERIES    . WISDOM TOOTH EXTRACTION       Current Outpatient Medications:  .  bisacodyl (DULCOLAX) 10 MG suppository, Place 1 suppository (10 mg total) rectally as needed for moderate constipation or severe constipation. (Patient not taking: Reported on 12/09/2018), Disp: 12 suppository, Rfl: 2 .  docusate sodium (COLACE) 100 MG capsule, Take 1 capsule (100 mg total) by mouth 2 (two) times daily as needed for mild constipation or moderate constipation. (Patient not taking: Reported on 12/09/2018), Disp: 30 capsule, Rfl: 2 .  ferrous sulfate 325 (65 FE) MG tablet, TAKE 1 TABLET BY MOUTH TWICE A DAY (Patient not taking: Reported on 04/12/2019), Disp: 60 tablet, Rfl: 5 .  folic acid (FOLVITE) 1 MG tablet, TAKE 1 TABLET (1 MG TOTAL) BY MOUTH  DAILY. (Patient not taking: Reported on 11/22/2018), Disp: 30 tablet, Rfl: 6 .  polyethylene glycol (MIRALAX / GLYCOLAX) 17 g packet, Take 17 g by mouth daily as needed for moderate constipation or severe constipation. (Patient not taking: Reported on 12/09/2018), Disp: 14 each, Rfl: 1 .  prenatal vitamin w/FE, FA (PRENATAL 1 + 1) 27-1 MG TABS tablet, Take 1 tablet by mouth daily at 12 noon., Disp: , Rfl:   The following portions of the patient's history were reviewed and updated as appropriate: allergies, current medications, past family history, past medical history, past social history, past surgical history and problem list.   Review of Systems:  Pertinent items noted in HPI and remainder of comprehensive ROS otherwise negative.   Objective:  Physical Exam BP 110/72   Pulse (!) 102   Wt 118 lb (53.5 kg)   LMP 02/21/2018   BMI 21.58 kg/m  CONSTITUTIONAL: Well-developed, well-nourished female in no acute distress.  HENT:  Normocephalic, atraumatic. External right and left ear normal. Oropharynx is clear and moist EYES: Conjunctivae and EOM are normal. Pupils are equal, round, and reactive to light. No scleral icterus.  NECK: Normal range of motion, supple, no masses SKIN: Skin is warm and dry. No rash noted. Not diaphoretic. No erythema. No pallor. NEUROLOGIC: Alert and oriented to person, place, and time. Normal reflexes, muscle tone coordination. No cranial nerve deficit noted. PSYCHIATRIC: Normal mood and affect. Normal behavior. Normal judgment and thought content. CARDIOVASCULAR: Normal heart rate noted RESPIRATORY: Effort normal, no  problems with respiration noted ABDOMEN: Soft, no distention noted. Wound with 2 cm scab remaining, appears to be healing very well PELVIC: deferred MUSCULOSKELETAL: Normal range of motion. No edema noted.  Labs and Imaging No results found.  Assessment & Plan:   1. Wound infection after surgery Appears to be almost completely healed Gave  precautions Return if wound opens up or does not heal entirely  2. Post-operative state See above   Routine preventative health maintenance measures emphasized. Please refer to After Visit Summary for other counseling recommendations.   Return in about 6 months (around 11/27/2019) for annual.  Total face-to-face time with patient: 15 minutes. Over 50% of encounter was spent on counseling and coordination of care.  Baldemar Lenis, M.D. Attending Center for [employer] [facility])

## 2019-06-26 ENCOUNTER — Other Ambulatory Visit: Payer: 59

## 2019-06-30 IMAGING — US US MFM OB FOLLOW-UP
1 series · 14 of 28 positions shown · non-contrast
Comparison: none

[Series 1: us mfm ob follow-up · 14 of 32 slices shown]
[im 2/32]
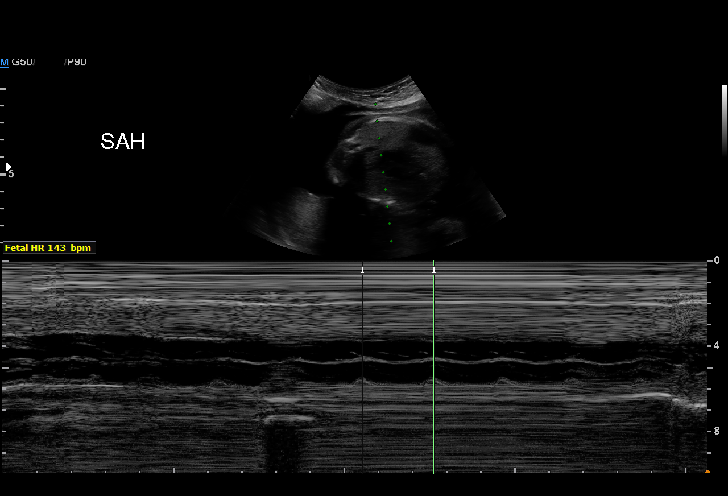
[im 4/32]
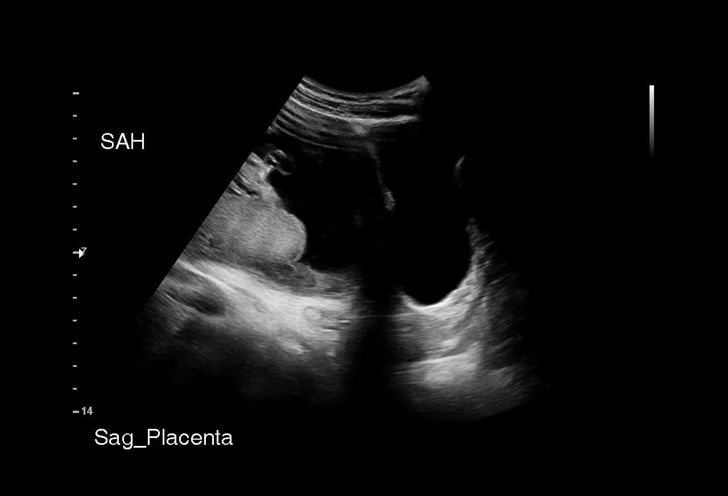
[im 6/32]
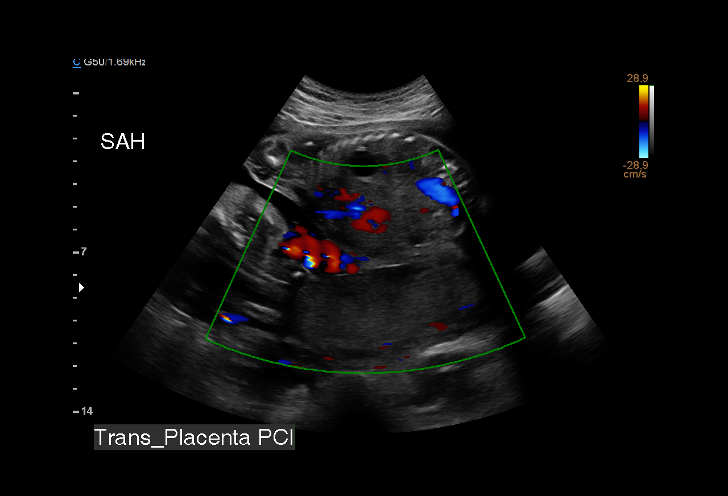
[im 9/32]
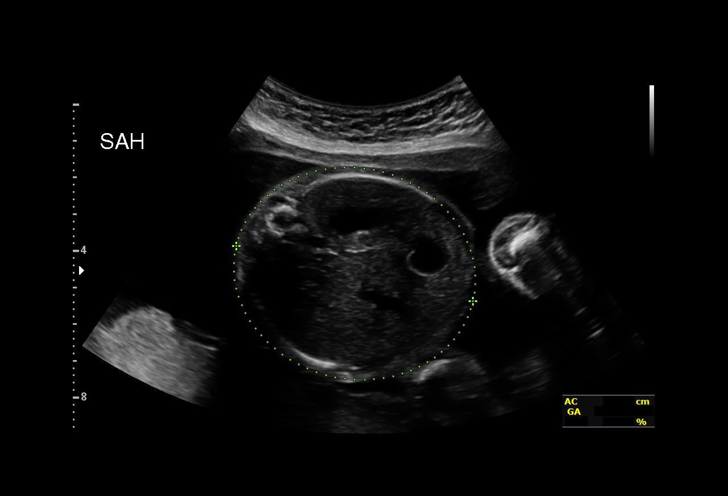
[im 11/32]
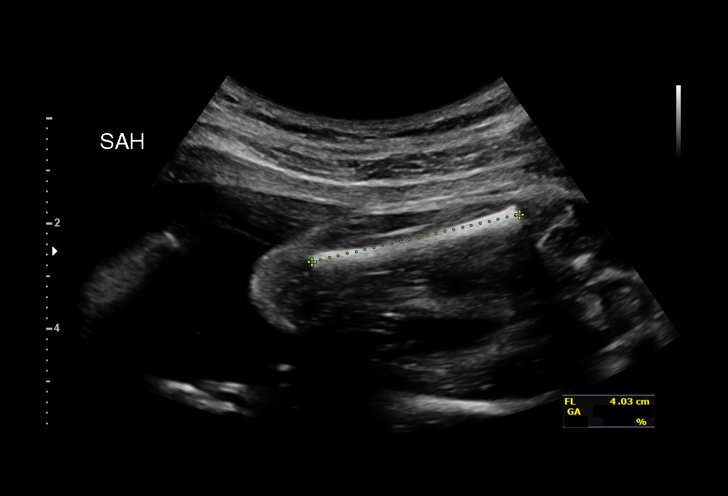
[im 13/32]
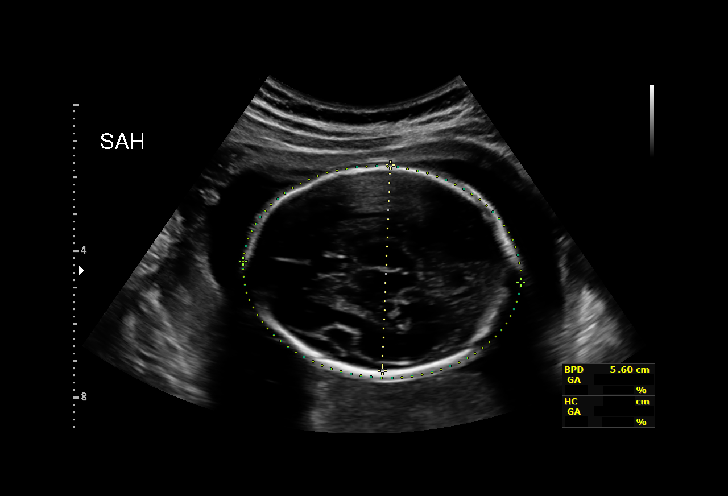
[im 15/32]
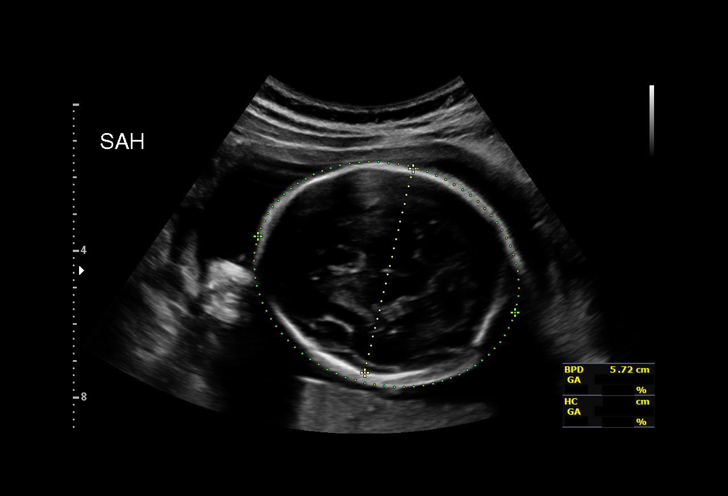
[im 18/32]
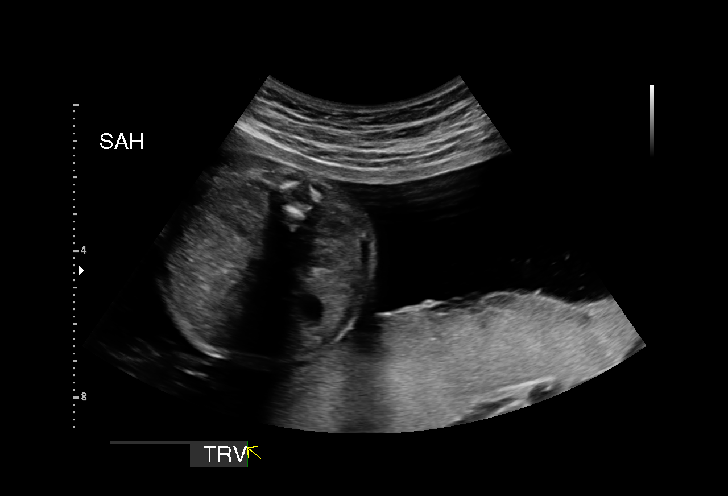
[im 20/32]
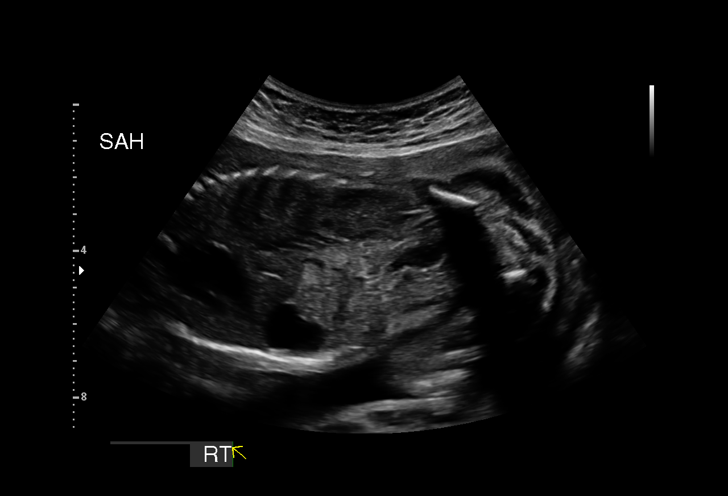
[im 22/32]
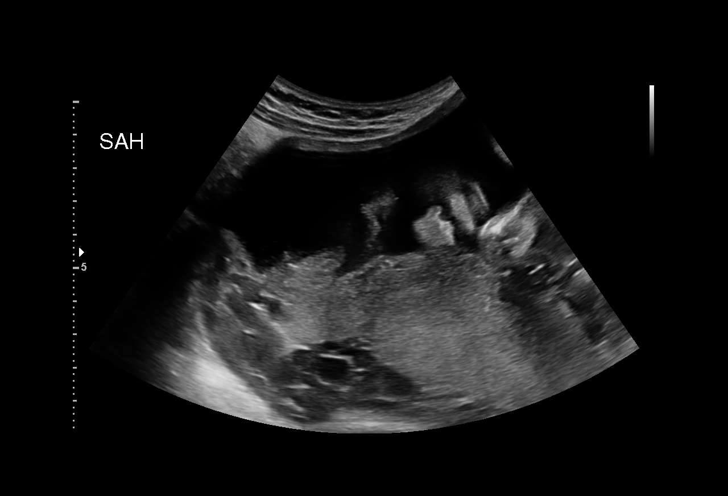
[im 25/32]
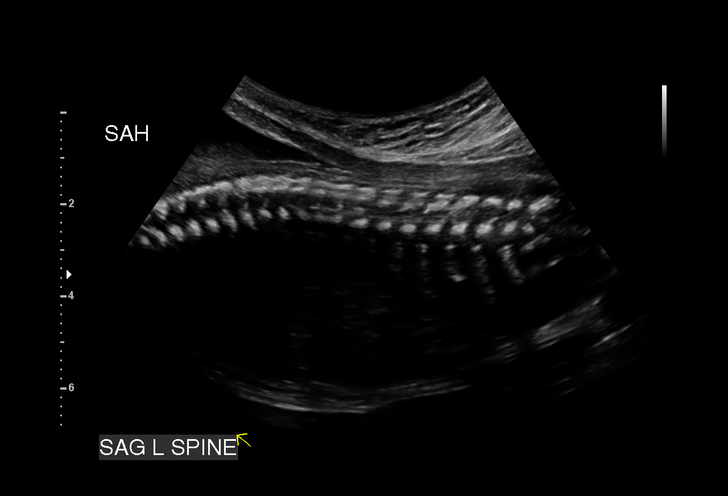
[im 27/32]
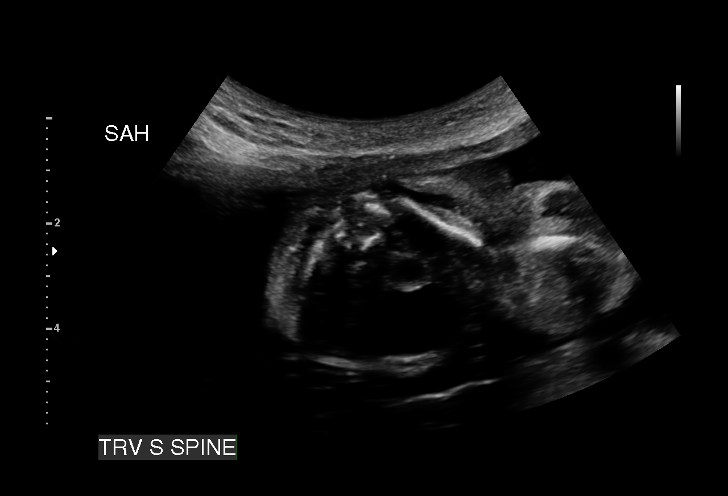
[im 29/32]
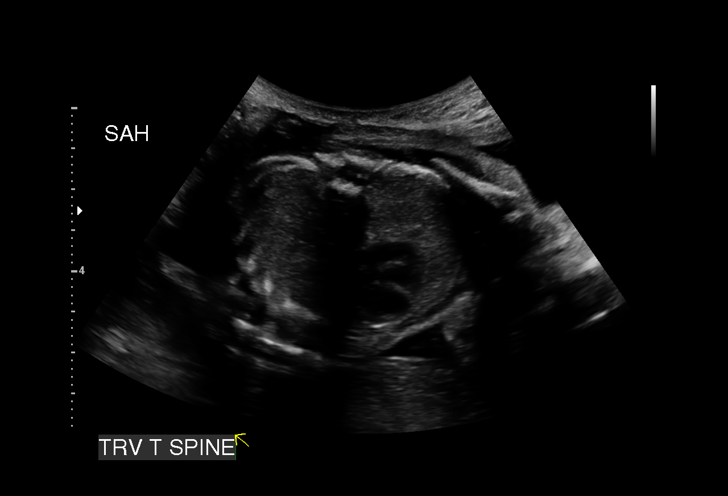
[im 32/32]
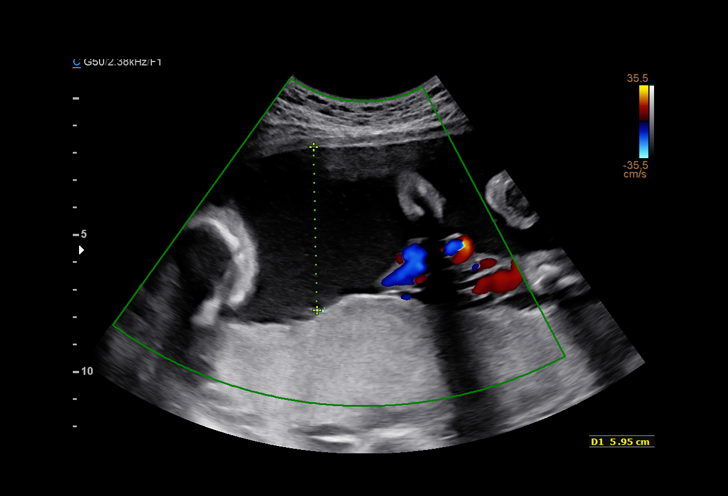

[14 of 28 positions shown; findings below may reference images not displayed]

----------------------------------------------------------------------

 ----------------------------------------------------------------------
Indications

  Encounter for other antenatal screening
  follow-up (low risk NIPS, AFP neg)
  Maternal sickle cell anemia, second            O99.012,
  trimester (no crisis in over 4 years)
  23 weeks gestation of pregnancy
 ----------------------------------------------------------------------
Vital Signs

                                                Height:        5'2"
Fetal Evaluation

 Num Of Fetuses:          1
 Fetal Heart Rate(bpm):   143
 Cardiac Activity:        Observed
 Presentation:            Transverse, head to maternal right
 Placenta:                Posterior
 P. Cord Insertion:       Visualized, central

 Amniotic Fluid
 AFI FV:      Within normal limits

                             Largest Pocket(cm)

Biometry

 BPD:      56.3  mm     G. Age:  23w 1d         53  %    CI:        72.27   %    70 - 86
                                                         FL/HC:       19.1  %    19.2 -
 HC:      210.7  mm     G. Age:  23w 1d         41  %    HC/AC:       1.07       1.05 -
 AC:      197.5  mm     G. Age:  24w 3d         82  %    FL/BPD:      71.6  %    71 - 87
 FL:       40.3  mm     G. Age:  23w 0d         40  %    FL/AC:       20.4  %    20 - 24

 Est. FW:     622   gm     1 lb 6 oz     64  %
OB History

 Gravidity:    3         Term:   0         SAB:   2
 Living:       0
Gestational Age

 LMP:           23w 0d        Date:  02/21/18                 EDD:   11/28/18
 U/S Today:     23w 3d                                        EDD:   11/25/18
 Best:          23w 0d     Det. By:  LMP  (02/21/18)          EDD:   11/28/18
Anatomy

 Cranium:               Appears normal         Aortic Arch:            Previously seen
 Cavum:                 Previously seen        Ductal Arch:            Previously seen
 Ventricles:            Previously seen        Diaphragm:              Previously seen
 Choroid Plexus:        Previously seen        Stomach:                Appears normal, left
                                                                       sided
 Cerebellum:            Previously seen        Abdomen:                Previously seen
 Posterior Fossa:       Previously seen        Abdominal Wall:         Previously seen
 Nuchal Fold:           Previously seen        Cord Vessels:           Previously seen
 Face:                  Orbits and profile     Kidneys:                Appear normal
                        previously seen
 Lips:                  Previously seen        Bladder:                Appears normal
 Thoracic:              Appears normal         Spine:                  Previously seen
 Heart:                 Previously seen        Upper Extremities:      Previously seen
 RVOT:                  Previously seen        Lower Extremities:      Previously seen
 LVOT:                  Previously seen

 Other:  Female gender Heels and 5th digit visualized previously. Nasal bone
         visualized previously. Open hands visualized previously.
Cervix Uterus Adnexa

 Cervix
 Not adaquately visualized

 Uterus
 No abnormality visualized.

 Left Ovary
 No adnexal mass visualized.

 Right Ovary
 No adnexal mass visualized.

 Cul De Sac
 No free fluid seen.

 Adnexa
 No abnormality visualized.
Impression

 Normal interval growth.
Recommendations

 Follow up growth at 28 weeks.

## 2019-07-28 ENCOUNTER — Ambulatory Visit: Payer: Self-pay | Admitting: Nurse Practitioner

## 2019-07-31 ENCOUNTER — Other Ambulatory Visit: Payer: Self-pay

## 2019-07-31 ENCOUNTER — Encounter: Payer: Self-pay | Admitting: Nurse Practitioner

## 2019-07-31 ENCOUNTER — Ambulatory Visit (INDEPENDENT_AMBULATORY_CARE_PROVIDER_SITE_OTHER): Payer: Medicaid Other | Admitting: Nurse Practitioner

## 2019-07-31 VITALS — BP 113/64 | HR 88 | Temp 98.9°F | Resp 14 | Ht 62.0 in | Wt 117.0 lb

## 2019-07-31 DIAGNOSIS — R809 Proteinuria, unspecified: Secondary | ICD-10-CM | POA: Diagnosis not present

## 2019-07-31 DIAGNOSIS — D5700 Hb-SS disease with crisis, unspecified: Secondary | ICD-10-CM | POA: Diagnosis not present

## 2019-07-31 LAB — POCT URINALYSIS DIPSTICK
Bilirubin, UA: NEGATIVE
Blood, UA: NEGATIVE
Glucose, UA: NEGATIVE
Ketones, UA: NEGATIVE
Leukocytes, UA: NEGATIVE
Nitrite, UA: NEGATIVE
Protein, UA: POSITIVE — AB
Spec Grav, UA: 1.02 (ref 1.010–1.025)
Urobilinogen, UA: 1 E.U./dL
pH, UA: 6 (ref 5.0–8.0)

## 2019-07-31 NOTE — Patient Instructions (Signed)
Proteinuria Proteinuria is when there is too much protein in the urine. Proteins are important for building muscles and bones. Proteins are also needed to fight infections, help the blood to clot, and keep body fluids in balance. Proteinuria may be mild and temporary, or it may be an early sign of kidney disease. The kidneys make urine. Healthy kidneys also keep substances like proteins from leaving the blood and ending up in the urine. What are the causes? This condition may be caused by damage to the kidneys or by temporary causes such as fever or stress. Proteinuria may happen when the kidneys are not working well. Healthy kidneys have filters (glomeruli) that keep proteins out of the urine. Proteinuria may mean that the glomeruli are damaged. The main causes of this type of damage are:  Diabetes.  High blood pressure. Other causes of kidney damage can also cause proteinuria, such as:  Diseases of the immune system, such as lupus, rheumatoid arthritis, sarcoidosis, and Goodpasture syndrome.  Heart disease or heart failure.  Kidney infection.  Certain cancers, including kidney cancer, lymphoma, leukemia, and multiple myeloma.  Amyloidosis. This is a disease that causes abnormal proteins to build up in body tissues.  Reactions to certain medicines, such as NSAIDs.  Injuries or poisons (toxins).  High blood pressure that occurs during pregnancy (preeclampsia and eclampsia). Temporary proteinuria may result from conditions that put stress on the kidneys. These conditions usually do not cause kidney damage. They include:  Fever.  Exposure to cold or heat.  Emotional or physical stress.  Extreme exercise.  Standing for long periods of time. What increases the risk? You are more likely to develop this condition if you:  Have diabetes.  Have high blood pressure.  Have heart disease or heart failure.  Have an immune disease, cancer, or other disease that affects the  kidneys.  Have a family history of kidney disease.  Are 65 years of age or older.  Are overweight.  Are of African American, American Indian, Hispanic/Latino, or Pacific Islander descent.  Are pregnant.  Have an infection. What are the signs or symptoms? Mild proteinuria may not cause symptoms. As more proteins enter the urine, symptoms of kidney disease may develop, such as:  Foamy urine.  Swelling of the face, abdomen, hands, legs, or feet (edema).  Needing to urinate frequently.  Fatigue.  Difficulty sleeping.  Dry and itchy skin.  Nausea and vomiting.  Muscle cramps.  Shortness of breath. How is this diagnosed? This condition may be diagnosed with a urine test. You may have this test as part of a routine physical exam or because you have symptoms of kidney disease or risk factors for kidney disease. You may also have:  Blood tests to measure the level of a certain substance (creatinine) that increases with kidney disease.  Imaging tests of your kidney, such as a CT scan or an ultrasound, to look for signs of kidney damage. How is this treated? If your proteinuria is mild or temporary, treatment may not be needed for this condition. Your health care provider may show you how to monitor the level of protein in your urine at home. Identifying proteinuria early is important so that the cause of the condition can be treated. Treatment for this condition depends on the cause of your proteinuria. Treatment may include:  Making diet and lifestyle changes.  Getting blood pressure under control.  Getting blood sugar under control, if you have diabetes.  Managing any other medical conditions you have that   affect your kidneys.  Giving birth, if you are pregnant.  Avoiding medicines that damage your kidneys. In severe cases, kidney disease may need to be treated with medicines or dialysis. Follow these instructions at home: Activity  Return to your normal activities  as told by your health care provider. Ask your health care provider what activities are safe for you.  Ask your health care provider to recommend an exercise program. General instructions  Check your protein levels at home if directed by your health care provider.  Follow instructions from your health care provider about eating or drinking restrictions.  If you are overweight, ask your health care provider about diets that can help you get to a healthy weight.  Take over-the-counter and prescription medicines only as told by your health care provider.  Keep all follow-up visits as told by your health care provider. This is important. Contact a health care provider if:  You have new symptoms.  Your symptoms get worse or do not improve. Get help right away if you:  Have back pain.  Have diarrhea.  Vomit.  Have a fever.  Have a rash. Summary  Proteinuria is when there is too much protein in the urine.  Proteinuria may be mild and temporary, or it may be an early sign of kidney disease.  This condition may be diagnosed with a urine test.  Treatment for this condition depends on the cause of your proteinuria.  Treatment may include diet and lifestyle changes, blood pressure and blood sugar management, and avoiding medicines that may damage the kidneys. If the proteinuria is severe, it may need to be treated with medicines or dialysis. This information is not intended to replace advice given to you by your health care provider. Make sure you discuss any questions you have with your health care provider. Document Revised: 01/24/2018 Document Reviewed: 01/24/2018 [employer] Patient Education  Center.

## 2019-07-31 NOTE — Progress Notes (Signed)
Established Patient Office Visit  Subjective:  Patient ID: Makayla Fletcher, female    DOB: [birth year]  Age: 24 y.o. MRN: [id number]  CC:  Chief Complaint  Patient presents with  . Sickle Cell Anemia    HPI Makayla Fletcher presents for follow up.  She has a history of sickle cell anemia. She is doing well overall.  She had a slight crisis a during the last 6 months but was dealt with at home.  She admits that she used ice packs and over-the-counter medication.  Past Medical History:  Diagnosis Date  . Depression   . Migraine without aura, without mention of intractable migraine without mention of status migrainosus   . Sickle cell anemia (HCC)   . Urinary tract infection July 2013   frequent     Past Surgical History:  Procedure Laterality Date  . ABDOMINAL HYSTERECTOMY N/A 11/23/2018   Procedure: HYSTERECTOMY ABDOMINAL;  Surgeon: Aletha Halim, MD;  Location: [facility];  Service: Gynecology;  Laterality: N/A;  . CYSTOSCOPY N/A 11/23/2018   Procedure: CYSTOSCOPY;  Surgeon: Aletha Halim, MD;  Location: [facility];  Service: Gynecology;  Laterality: N/A;  . DILATION AND CURETTAGE OF UTERUS N/A 11/23/2018   Procedure: DILATATION AND CURETTAGE;  Surgeon: Aletha Halim, MD;  Location: [facility];  Service: Gynecology;  Laterality: N/A;  . LAPAROTOMY N/A 11/23/2018   Procedure: EXPLORATORY LAPAROTOMY;  Surgeon: Aletha Halim, MD;  Location: [facility];  Service: Gynecology;  Laterality: N/A;  . NO PAST SURGERIES    . WISDOM TOOTH EXTRACTION      Family History  Problem Relation Age of Onset  . Migraines Father     Social History   Socioeconomic History  . Marital status: Single    Spouse name: Not on file  . Number of children: 0  . Years of education: college  . Highest education level: Not on file  Occupational History  . Occupation: MINOR    Employer: UNEMPLOYED  . Occupation: Ship broker    Comment: Psychologist, clinical at [employer]  . Occupation: MENTOR/TEACHER   Employer: [employer]  Tobacco Use  . Smoking status: Never Smoker  . Smokeless tobacco: Never Used  Substance and Sexual Activity  . Alcohol use: Not Currently    Comment: Wine  . Drug use: No  . Sexual activity: Not Currently    Partners: Male    Birth control/protection: None    Comment: Depoprovera   Other Topics Concern  . Not on file  Social History Narrative  . Not on file   Social Determinants of Health   Financial Resource Strain: Low Risk   . Difficulty of Paying Living Expenses: Not hard at all  Food Insecurity: No Food Insecurity  . Worried About Charity fundraiser in the Last Year: Never true  . Ran Out of Food in the Last Year: Never true  Transportation Needs: Unknown  . Lack of Transportation (Medical): No  . Lack of Transportation (Non-Medical): Not on file  Physical Activity:   . Days of Exercise per Week: Not on file  . Minutes of Exercise per Session: Not on file  Stress: No Stress Concern Present  . Feeling of Stress : Not at all  Social Connections:   . Frequency of Communication with Friends and Family: Not on file  . Frequency of Social Gatherings with Friends and Family: Not on file  . Attends Religious Services: Not on file  . Active Member of Clubs or Organizations:  Not on file  . Attends Banker Meetings: Not on file  . Marital Status: Not on file  Intimate Partner Violence: Not At Risk  . Fear of Current or Ex-Partner: No  . Emotionally Abused: No  . Physically Abused: No  . Sexually Abused: No    Outpatient Medications Prior to Visit  Medication Sig Dispense Refill  . amoxicillin (AMOXIL) 500 MG capsule amoxicillin 500 mg capsule    . bisacodyl (DULCOLAX) 10 MG suppository Place 1 suppository (10 mg total) rectally as needed for moderate constipation or severe constipation. (Patient not taking: Reported on 12/09/2018) 12 suppository 2  . docusate sodium (COLACE) 100 MG capsule Take 1 capsule (100 mg total) by mouth 2 (two)  times daily as needed for mild constipation or moderate constipation. (Patient not taking: Reported on 12/09/2018) 30 capsule 2  . ferrous sulfate 325 (65 FE) MG tablet TAKE 1 TABLET BY MOUTH TWICE A DAY (Patient not taking: Reported on 04/12/2019) 60 tablet 5  . folic acid (FOLVITE) 1 MG tablet TAKE 1 TABLET (1 MG TOTAL) BY MOUTH DAILY. (Patient not taking: Reported on 11/22/2018) 30 tablet 6  . polyethylene glycol (MIRALAX / GLYCOLAX) 17 g packet Take 17 g by mouth daily as needed for moderate constipation or severe constipation. (Patient not taking: Reported on 12/09/2018) 14 each 1  . prenatal vitamin w/FE, FA (PRENATAL 1 + 1) 27-1 MG TABS tablet Take 1 tablet by mouth daily at 12 noon.     No facility-administered medications prior to visit.    No Known Allergies  ROS Review of Systems  Constitutional: Negative.   HENT: Negative.   Eyes: Negative.   Respiratory: Negative.   Cardiovascular: Negative.   Gastrointestinal: Negative.   Endocrine: Negative.   Genitourinary: Negative.   Musculoskeletal: Negative.   Skin: Negative.   Allergic/Immunologic: Negative.   Neurological: Negative.   Hematological: Negative.   Psychiatric/Behavioral: Negative.       Objective:    Physical Exam  Constitutional: She is oriented to person, place, and time. She appears well-developed.  HENT:  Head: Normocephalic.  Cardiovascular: Normal rate, regular rhythm and normal heart sounds.  Pulmonary/Chest: Effort normal and breath sounds normal.  Abdominal: Soft.  Hypoactive  Musculoskeletal:        General: Normal range of motion.     Cervical back: Normal range of motion and neck supple.  Neurological: She is alert and oriented to person, place, and time.  Skin: Skin is warm and dry.  Psychiatric: She has a normal mood and affect. Her behavior is normal. Judgment and thought content normal.    BP 113/64 (BP Location: Right Arm, Patient Position: Sitting, Cuff Size: Normal)   Pulse 88   Temp  98.9 F (37.2 C) (Oral)   Resp 14   Ht 5\' 2"  (1.575 m)   Wt 117 lb (53.1 kg)   LMP 02/21/2018   SpO2 100%   BMI 21.40 kg/m  Wt Readings from Last 3 Encounters:  07/31/19 117 lb (53.1 kg)  05/29/19 118 lb (53.5 kg)  05/11/19 118 lb (53.5 kg)     There are no preventive care reminders to display for this patient.  There are no preventive care reminders to display for this patient.  Lab Results  Component Value Date   TSH 0.638 06/06/2015   Lab Results  Component Value Date   WBC 4.9 03/10/2019   HGB 10.8 (L) 03/10/2019   HCT 29.6 (L) 03/10/2019   MCV 83.6 03/10/2019  PLT 103 (L) 03/10/2019   Lab Results  Component Value Date   NA 141 03/10/2019   K 3.7 03/10/2019   CO2 26 03/10/2019   GLUCOSE 88 03/10/2019   BUN <5 (L) 03/10/2019   CREATININE 0.54 03/10/2019   BILITOT 1.1 03/10/2019   ALKPHOS 57 03/10/2019   AST 20 03/10/2019   ALT 13 03/10/2019   PROT 7.9 03/10/2019   ALBUMIN 4.5 03/10/2019   CALCIUM 9.4 03/10/2019   ANIONGAP 8 03/10/2019   No results found for: CHOL No results found for: HDL No results found for: [facility] Lab Results  Component Value Date   TRIG 276 (H) 11/27/2018   No results found for: CHOLHDL Lab Results  Component Value Date   HGBA1C 4.0 03/15/2014      Assessment & Plan:   Problem List Items Addressed This Visit      Unprioritized   Sickle cell anemia (HCC) - Primary   Relevant Orders   Urinalysis Dipstick (Completed)   Sickle Cell Panel    Other Visit Diagnoses    Proteinuria, unspecified type       Will have patient collect first morning void and retest       No orders of the defined types were placed in this encounter.   Follow-up: Return in about 6 months (around 01/28/2020).    Barbette Merino, NP

## 2019-08-01 LAB — CMP14+CBC/D/PLT+FER+RETIC+V...
ALT: 10 IU/L (ref 0–32)
AST: 18 IU/L (ref 0–40)
Albumin/Globulin Ratio: 1.5 (ref 1.2–2.2)
Albumin: 4.5 g/dL (ref 3.9–5.0)
Alkaline Phosphatase: 64 IU/L (ref 39–117)
BUN/Creatinine Ratio: 6 — ABNORMAL LOW (ref 9–23)
BUN: 4 mg/dL — ABNORMAL LOW (ref 6–20)
Basophils Absolute: 0 10*3/uL (ref 0.0–0.2)
Basos: 0 %
Bilirubin Total: 0.9 mg/dL (ref 0.0–1.2)
CO2: 25 mmol/L (ref 20–29)
Calcium: 9.3 mg/dL (ref 8.7–10.2)
Chloride: 104 mmol/L (ref 96–106)
Creatinine, Ser: 0.71 mg/dL (ref 0.57–1.00)
EOS (ABSOLUTE): 0.1 10*3/uL (ref 0.0–0.4)
Eos: 2 %
Ferritin: 205 ng/mL — ABNORMAL HIGH (ref 15–150)
GFR calc Af Amer: 137 mL/min/{1.73_m2} (ref >59)
GFR calc non Af Amer: 119 mL/min/{1.73_m2} (ref >59)
Globulin, Total: 3.1 g/dL (ref 1.5–4.5)
Glucose: 75 mg/dL (ref 65–99)
Hematocrit: 33 % — ABNORMAL LOW (ref 34.0–46.6)
Hemoglobin: 11.3 g/dL (ref 11.1–15.9)
Immature Grans (Abs): 0 10*3/uL (ref 0.0–0.1)
Immature Granulocytes: 0 %
Lymphocytes Absolute: 1.3 10*3/uL (ref 0.7–3.1)
Lymphs: 27 %
MCH: 29.6 pg (ref 26.6–33.0)
MCHC: 34.2 g/dL (ref 31.5–35.7)
MCV: 86 fL (ref 79–97)
Monocytes Absolute: 0.3 10*3/uL (ref 0.1–0.9)
Monocytes: 6 %
Neutrophils Absolute: 3.1 10*3/uL (ref 1.4–7.0)
Neutrophils: 65 %
Platelets: 124 10*3/uL — ABNORMAL LOW (ref 150–450)
Potassium: 4.1 mmol/L (ref 3.5–5.2)
RBC: 3.82 x10E6/uL (ref 3.77–5.28)
RDW: 14.9 % (ref 11.7–15.4)
Retic Ct Pct: 2.3 % (ref 0.6–2.6)
Sodium: 141 mmol/L (ref 134–144)
Total Protein: 7.6 g/dL (ref 6.0–8.5)
Vit D, 25-Hydroxy: 12.4 ng/mL — ABNORMAL LOW (ref 30.0–100.0)
WBC: 4.8 10*3/uL (ref 3.4–10.8)

## 2019-08-03 ENCOUNTER — Other Ambulatory Visit: Payer: Self-pay

## 2019-08-03 ENCOUNTER — Ambulatory Visit (INDEPENDENT_AMBULATORY_CARE_PROVIDER_SITE_OTHER): Payer: Managed Care, Other (non HMO)

## 2019-08-03 DIAGNOSIS — R809 Proteinuria, unspecified: Secondary | ICD-10-CM | POA: Diagnosis not present

## 2019-08-03 LAB — POCT URINALYSIS DIPSTICK
Bilirubin, UA: NEGATIVE
Blood, UA: NEGATIVE
Glucose, UA: NEGATIVE
Ketones, UA: NEGATIVE
Leukocytes, UA: NEGATIVE
Nitrite, UA: NEGATIVE
Protein, UA: NEGATIVE
Spec Grav, UA: 1.02 (ref 1.010–1.025)
Urobilinogen, UA: 1 E.U./dL
pH, UA: 7 (ref 5.0–8.0)

## 2019-08-03 NOTE — Progress Notes (Signed)
Patient came in for a UA only. Okay per Colonel Bald NP.

## 2019-08-07 ENCOUNTER — Telehealth: Payer: Self-pay | Admitting: Internal Medicine

## 2019-08-07 ENCOUNTER — Other Ambulatory Visit: Payer: Self-pay | Admitting: Nurse Practitioner

## 2019-08-07 DIAGNOSIS — D5700 Hb-SS disease with crisis, unspecified: Secondary | ICD-10-CM

## 2019-08-07 MED ORDER — OXYCODONE HCL 5 MG PO TABS: 5.0000 mg | ORAL_TABLET | ORAL | 0 refills | Status: DC | PRN

## 2019-08-07 NOTE — Telephone Encounter (Signed)
done

## 2019-08-30 ENCOUNTER — Other Ambulatory Visit: Payer: Self-pay | Admitting: Nurse Practitioner

## 2019-08-30 ENCOUNTER — Other Ambulatory Visit: Payer: Self-pay

## 2019-10-02 ENCOUNTER — Ambulatory Visit: Payer: Medicaid Other | Admitting: Obstetrics and Gynecology

## 2019-10-02 ENCOUNTER — Other Ambulatory Visit: Payer: Self-pay

## 2019-10-02 ENCOUNTER — Encounter: Payer: Self-pay | Admitting: Obstetrics and Gynecology

## 2019-10-02 ENCOUNTER — Other Ambulatory Visit (HOSPITAL_COMMUNITY)
Admission: RE | Admit: 2019-10-02 | Discharge: 2019-10-02 | Disposition: A | Discharge status: Home / Self Care | Payer: Medicaid Other | Source: Ambulatory Visit | Attending: Obstetrics and Gynecology | Admitting: Obstetrics and Gynecology

## 2019-10-02 VITALS — BP 111/72 | HR 74 | Wt 120.1 lb

## 2019-10-02 DIAGNOSIS — Z1272 Encounter for screening for malignant neoplasm of vagina: Secondary | ICD-10-CM

## 2019-10-02 DIAGNOSIS — N939 Abnormal uterine and vaginal bleeding, unspecified: Secondary | ICD-10-CM

## 2019-10-02 DIAGNOSIS — Z124 Encounter for screening for malignant neoplasm of cervix: Secondary | ICD-10-CM | POA: Insufficient documentation

## 2019-10-02 NOTE — Progress Notes (Signed)
24 yo Po presenting today for the evaluation of a 3-day history of vaginal bleeding. Patient describes the bleeding as light associated with mild cramping. Patient with a history of SCH in 11/2018 due to postpartum hemorrhage. Patient is without any other complaints  Past Medical History:  Diagnosis Date  . Depression   . Migraine without aura, without mention of intractable migraine without mention of status migrainosus   . Sickle cell anemia (HCC)   . Urinary tract infection July 2013   frequent    Past Surgical History:  Procedure Laterality Date  . ABDOMINAL HYSTERECTOMY N/A 11/23/2018   Procedure: HYSTERECTOMY ABDOMINAL;  Surgeon: [place] Bing, MD;  Location: [facility];  Service: Gynecology;  Laterality: N/A;  . CYSTOSCOPY N/A 11/23/2018   Procedure: CYSTOSCOPY;  Surgeon: [place] Bing, MD;  Location: [facility];  Service: Gynecology;  Laterality: N/A;  . DILATION AND CURETTAGE OF UTERUS N/A 11/23/2018   Procedure: DILATATION AND CURETTAGE;  Surgeon: [place] Bing, MD;  Location: [facility];  Service: Gynecology;  Laterality: N/A;  . LAPAROTOMY N/A 11/23/2018   Procedure: EXPLORATORY LAPAROTOMY;  Surgeon: [place] Bing, MD;  Location: [facility];  Service: Gynecology;  Laterality: N/A;  . NO PAST SURGERIES    . WISDOM TOOTH EXTRACTION     Family History  Problem Relation Age of Onset  . Migraines Father    Social History   Tobacco Use  . Smoking status: Never Smoker  . Smokeless tobacco: Never Used  Substance Use Topics  . Alcohol use: Not Currently    Comment: Wine  . Drug use: No   ROS See pertinent in HPI  Blood pressure 111/72, pulse 74, weight 120 lb 1.6 oz (54.5 kg), last menstrual period 02/21/2018, not currently breastfeeding. GENERAL: Well-developed, well-nourished female in no acute distress.  HEENT: Normocephalic, atraumatic. Sclerae anicteric.  NECK: Supple. Normal thyroid.  LUNGS: Clear to auscultation bilaterally.  HEART: Regular rate and  rhythm. BREASTS: Symmetric in size. No palpable masses or lymphadenopathy, skin changes, or nipple drainage. ABDOMEN: Soft, nontender, nondistended. No organomegaly. PELVIC: Normal external female genitalia. Vagina is pink and rugated.  Normal discharge. Normal appearing cervix. No adnexal mass or tenderness. EXTREMITIES: No cyanosis, clubbing, or edema, 2+ distal pulses.  A/P 24 yo with period like bleeding s/p [facility] - Reassurance provided as there may be a small amount of endometrial tissue left - Patient advised to keep track of a menstrual calendar - Pap smear collected - RTC in 1 year or prn

## 2019-10-04 LAB — CYTOLOGY - PAP
Adequacy: ABSENT
Diagnosis: NEGATIVE

## 2019-10-09 ENCOUNTER — Telehealth: Payer: Self-pay | Admitting: Nurse Practitioner

## 2019-10-09 NOTE — Telephone Encounter (Signed)
Called, no answer. Left a message to call back. Thanks!  

## 2019-10-09 NOTE — Telephone Encounter (Signed)
Please follow-up with Mrs. Denman See if she feels like the Percocet 5/325 would work better.  Thanks

## 2019-10-09 NOTE — Telephone Encounter (Signed)
Pt called in medication refill for oxycodone. However, they state they are having a lot of leg pain and the 5mg  is not working that well.

## 2019-10-10 NOTE — Telephone Encounter (Signed)
Called, no answer. Left a message to call back.  

## 2019-10-11 NOTE — Telephone Encounter (Signed)
Called, no answer. Left a message to call back.  

## 2019-10-23 IMAGING — DX PORTABLE CHEST - 1 VIEW
1 series · 1 of 1 positions shown · non-contrast
Comparison: 11/23/2018

CLINICAL DATA: Check endotracheal tube placement

EXAM:
PORTABLE CHEST 1 VIEW

[chest ap]
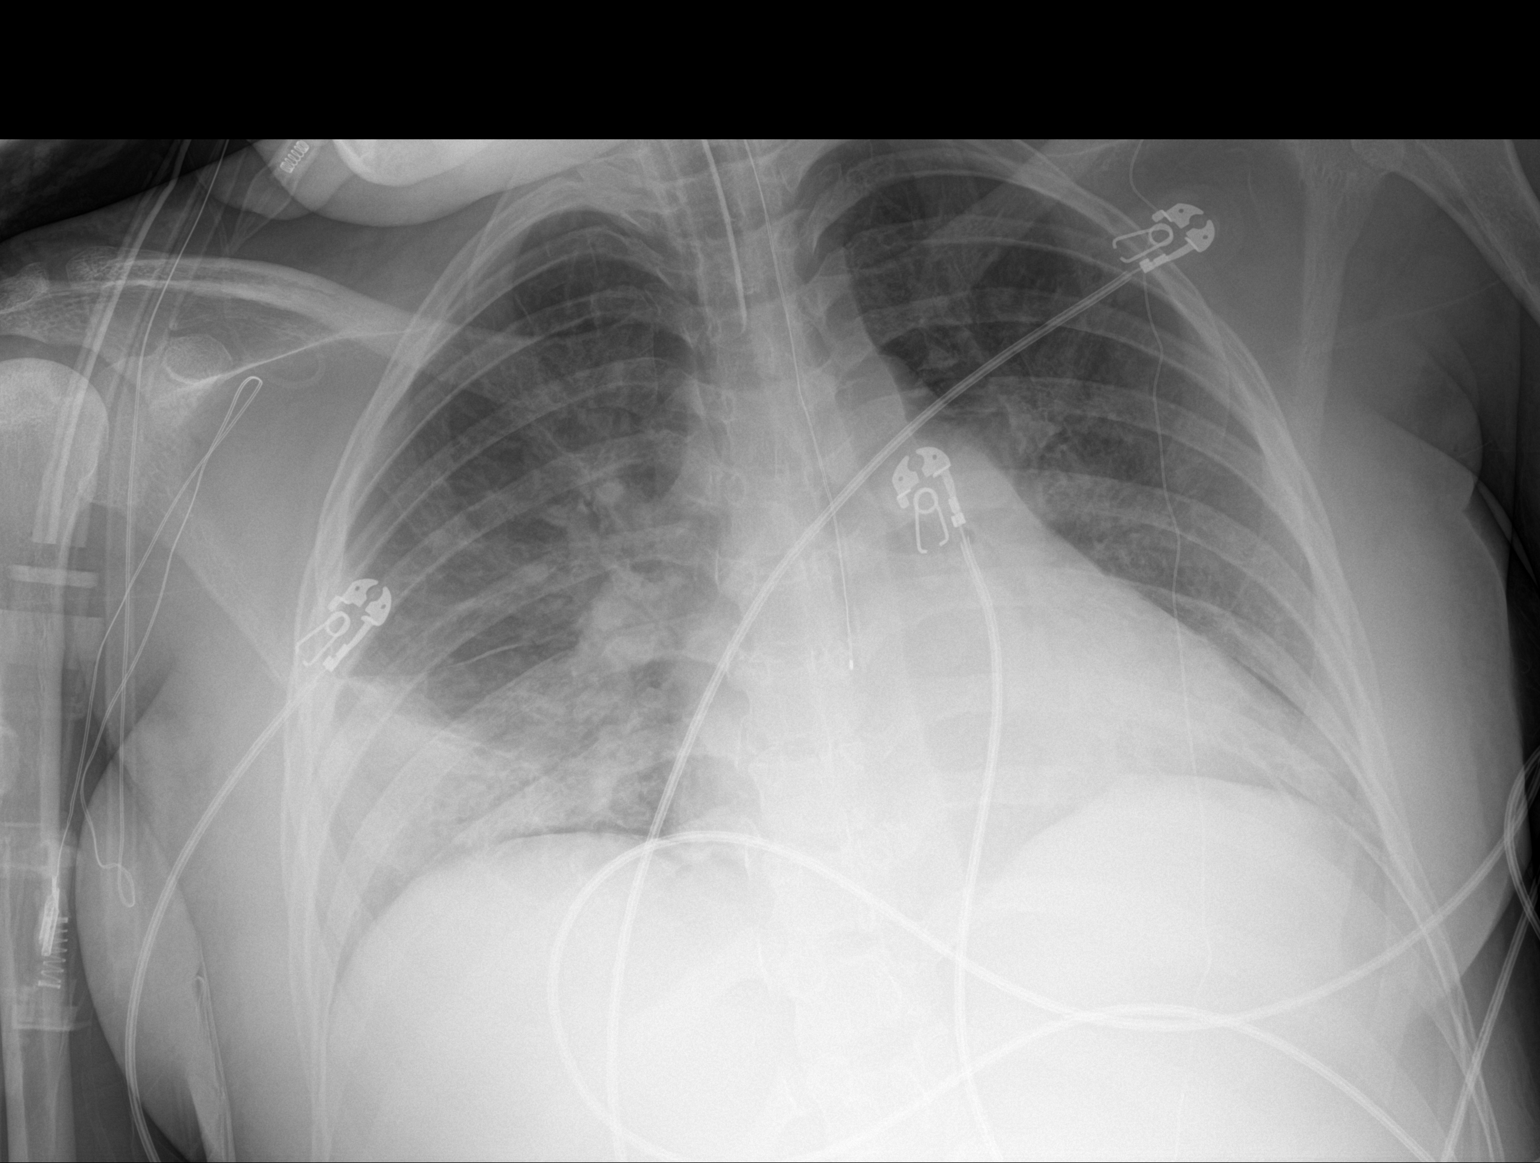

[1 of 1 positions shown; findings below may reference images not displayed]

FINDINGS: Cardiac shadow is stable. Endotracheal tube is noted in satisfactory
position. Increasing right basilar infiltrate is noted laterally.
Mild vascular congestion is noted as well.
IMPRESSION: Increasing right-sided infiltrate with vascular congestion.

## 2019-10-24 IMAGING — DX PORTABLE CHEST - 1 VIEW
1 series · 1 of 1 positions shown · non-contrast
Comparison: Radiograph November 24, 2018.

CLINICAL DATA: Acute pulmonary edema.

EXAM:
PORTABLE CHEST 1 VIEW

[chest ap]
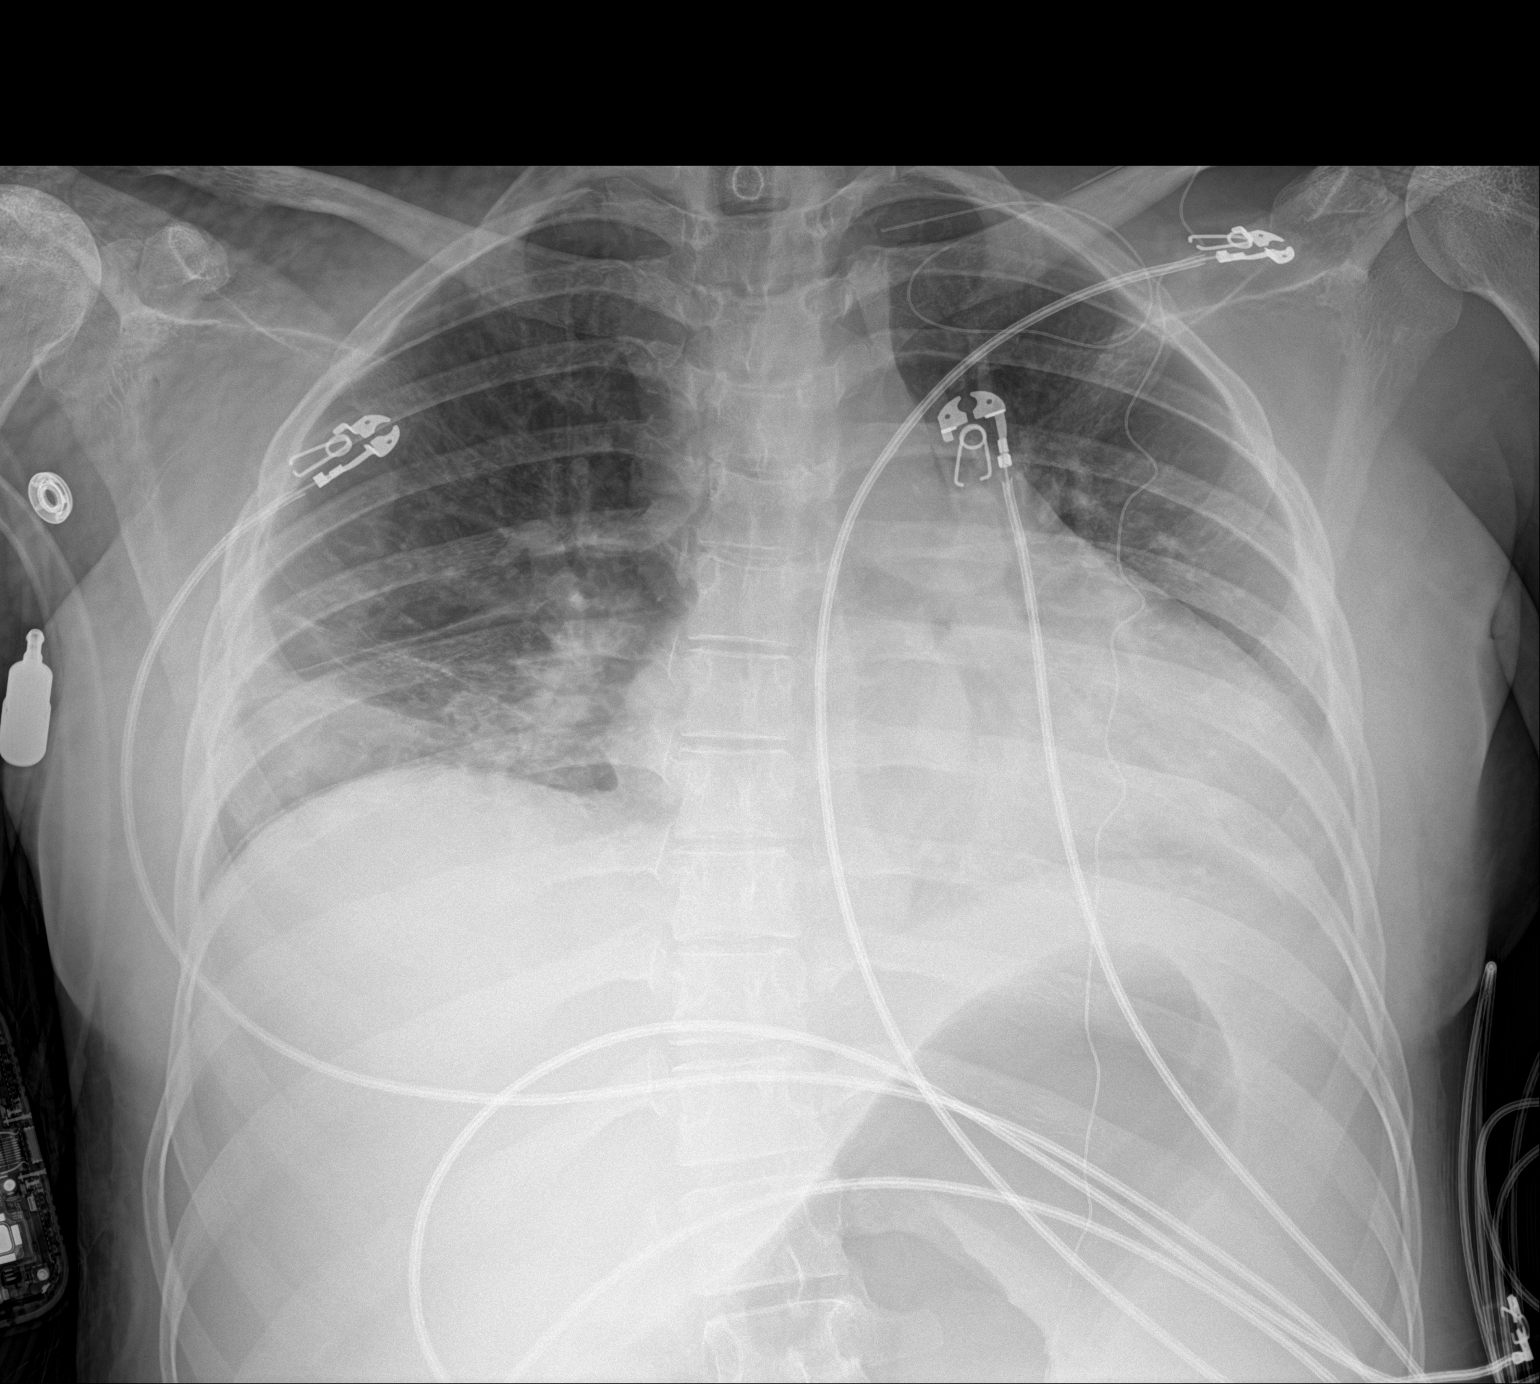

[1 of 1 positions shown; findings below may reference images not displayed]

FINDINGS: Stable cardiomediastinal silhouette. No pneumothorax is noted.
Endotracheal and nasogastric tubes have been removed. Left lung is
clear. Stable right basilar opacity is noted concerning for
atelectasis or infiltrate. Bony thorax is unremarkable.
IMPRESSION: Endotracheal and nasogastric tubes have been removed. Stable right
basilar opacity is noted concerning for atelectasis or infiltrate.

## 2019-10-25 IMAGING — DX PORTABLE CHEST - 1 VIEW
1 series · 1 of 1 positions shown · non-contrast
Comparison: Chest radiograph 01/25/2019

CLINICAL DATA: Respiratory failure.

EXAM:
PORTABLE CHEST 1 VIEW

[chest ap]
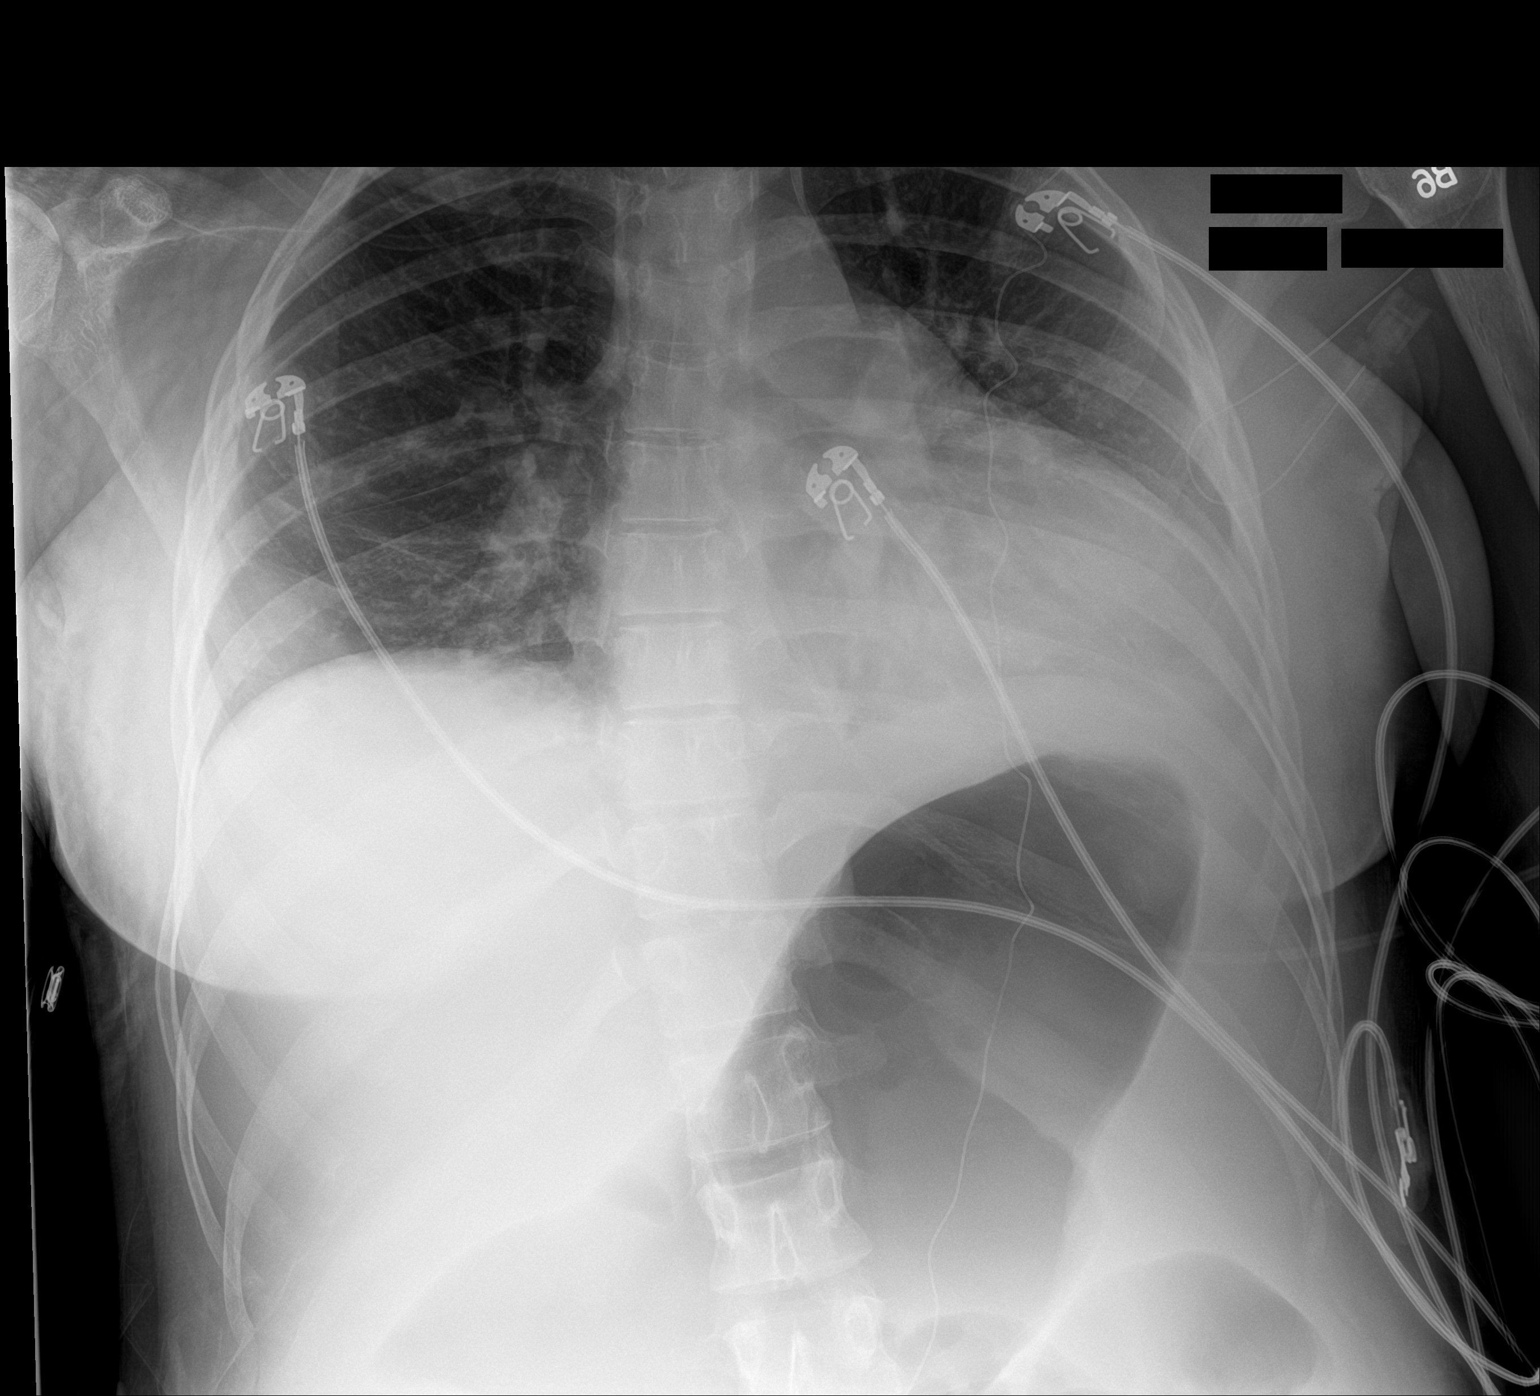

[1 of 1 positions shown; findings below may reference images not displayed]

FINDINGS: Monitoring leads overlie the patient. Stable cardiomegaly. Left
greater than right mid and lower lung airspace opacities. Possible
small left pleural effusion. No pneumothorax.
IMPRESSION: Similar-appearing basilar opacities which may represent atelectasis
or infection.

## 2019-12-27 ENCOUNTER — Emergency Department (HOSPITAL_COMMUNITY)
Admission: EM | Admit: 2019-12-27 | Discharge: 2019-12-27 | Disposition: A | Discharge status: Home / Self Care | Payer: Medicaid Other | Attending: Emergency Medicine | Admitting: Emergency Medicine

## 2019-12-27 ENCOUNTER — Telehealth: Payer: Self-pay | Admitting: Nurse Practitioner

## 2019-12-27 ENCOUNTER — Other Ambulatory Visit: Payer: Self-pay

## 2019-12-27 ENCOUNTER — Encounter (HOSPITAL_COMMUNITY): Payer: Self-pay | Admitting: Emergency Medicine

## 2019-12-27 ENCOUNTER — Other Ambulatory Visit: Payer: Self-pay | Admitting: Nurse Practitioner

## 2019-12-27 DIAGNOSIS — D5700 Hb-SS disease with crisis, unspecified: Secondary | ICD-10-CM | POA: Diagnosis not present

## 2019-12-27 DIAGNOSIS — M79605 Pain in left leg: Secondary | ICD-10-CM | POA: Diagnosis present

## 2019-12-27 LAB — COMPREHENSIVE METABOLIC PANEL
ALT: 18 U/L (ref 0–44)
AST: 29 U/L (ref 15–41)
Albumin: 5 g/dL (ref 3.5–5.0)
Alkaline Phosphatase: 55 U/L (ref 38–126)
Anion gap: 7 (ref 5–15)
BUN: 6 mg/dL (ref 6–20)
CO2: 28 mmol/L (ref 22–32)
Calcium: 9.3 mg/dL (ref 8.9–10.3)
Chloride: 105 mmol/L (ref 98–111)
Creatinine, Ser: 0.62 mg/dL (ref 0.44–1.00)
GFR calc Af Amer: >60 mL/min (ref >60)
GFR calc non Af Amer: >60 mL/min (ref >60)
Glucose, Bld: 82 mg/dL (ref 70–99)
Potassium: 4.2 mmol/L (ref 3.5–5.1)
Sodium: 140 mmol/L (ref 135–145)
Total Bilirubin: 1 mg/dL (ref 0.3–1.2)
Total Protein: 8.6 g/dL — ABNORMAL HIGH (ref 6.5–8.1)

## 2019-12-27 LAB — CBC WITH DIFFERENTIAL/PLATELET
Abs Immature Granulocytes: 0.02 10*3/uL (ref 0.00–0.07)
Basophils Absolute: 0 10*3/uL (ref 0.0–0.1)
Basophils Relative: 0 %
Eosinophils Absolute: 0 10*3/uL (ref 0.0–0.5)
Eosinophils Relative: 1 %
HCT: 31.4 % — ABNORMAL LOW (ref 36.0–46.0)
Hemoglobin: 11.4 g/dL — ABNORMAL LOW (ref 12.0–15.0)
Immature Granulocytes: 0 %
Lymphocytes Relative: 17 %
Lymphs Abs: 1 10*3/uL (ref 0.7–4.0)
MCH: 29.5 pg (ref 26.0–34.0)
MCHC: 36.3 g/dL — ABNORMAL HIGH (ref 30.0–36.0)
MCV: 81.3 fL (ref 80.0–100.0)
Monocytes Absolute: 0.3 10*3/uL (ref 0.1–1.0)
Monocytes Relative: 5 %
Neutro Abs: 4.3 10*3/uL (ref 1.7–7.7)
Neutrophils Relative %: 77 %
Platelets: 117 10*3/uL — ABNORMAL LOW (ref 150–400)
RBC: 3.86 MIL/uL — ABNORMAL LOW (ref 3.87–5.11)
RDW: 14.2 % (ref 11.5–15.5)
WBC: 5.7 10*3/uL (ref 4.0–10.5)
nRBC: 0 % (ref 0.0–0.2)

## 2019-12-27 LAB — RETICULOCYTES
Immature Retic Fract: 20 % — ABNORMAL HIGH (ref 2.3–15.9)
RBC.: 3.85 MIL/uL — ABNORMAL LOW (ref 3.87–5.11)
Retic Count, Absolute: 95.1 10*3/uL (ref 19.0–186.0)
Retic Ct Pct: 2.5 % (ref 0.4–3.1)

## 2019-12-27 MED ORDER — HYDROMORPHONE HCL 1 MG/ML IJ SOLN
1.0000 mg | Freq: Once | INTRAMUSCULAR | Status: AC
  Administered 2019-12-27: 1 mg via INTRAVENOUS
  Filled 2019-12-27: qty 1

## 2019-12-27 MED ORDER — SODIUM CHLORIDE 0.9 % IV BOLUS
1000.0000 mL | Freq: Once | INTRAVENOUS | Status: AC
  Administered 2019-12-27: 1000 mL via INTRAVENOUS

## 2019-12-27 MED ORDER — IBUPROFEN 800 MG PO TABS: 800.0000 mg | ORAL_TABLET | Freq: Three times a day (TID) | ORAL | 0 refills | Status: DC

## 2019-12-27 MED ORDER — ONDANSETRON HCL 4 MG/2ML IJ SOLN
4.0000 mg | Freq: Once | INTRAMUSCULAR | Status: AC
  Administered 2019-12-27: 4 mg via INTRAVENOUS
  Filled 2019-12-27: qty 2

## 2019-12-27 MED ORDER — KETOROLAC TROMETHAMINE 30 MG/ML IJ SOLN
30.0000 mg | Freq: Once | INTRAMUSCULAR | Status: AC
  Administered 2019-12-27: 30 mg via INTRAVENOUS
  Filled 2019-12-27: qty 1

## 2019-12-27 MED ORDER — HYDROMORPHONE HCL 1 MG/ML IJ SOLN
0.5000 mg | Freq: Once | INTRAMUSCULAR | Status: AC
  Administered 2019-12-27: 0.5 mg via INTRAVENOUS
  Filled 2019-12-27: qty 1

## 2019-12-27 MED ORDER — OXYCODONE HCL 5 MG PO TABS: 5.0000 mg | ORAL_TABLET | ORAL | 0 refills | Status: DC | PRN

## 2019-12-27 NOTE — Telephone Encounter (Signed)
sent 

## 2019-12-27 NOTE — Discharge Instructions (Addendum)
Continue taking your oxycodone 4 times a day for pain. You can take 2 at a time if you need to. Drink plenty of fluids and follow-up with your doctor if not improving

## 2019-12-27 NOTE — ED Provider Notes (Signed)
[facility]-EMERGENCY DEPT Provider Note   CSN: [id number] Arrival date & time: 12/27/19  0719     History Chief Complaint  Patient presents with  . Sickle Cell Pain Crisis  . Leg Pain    Makayla Fletcher is a 23 y.o. female.  Patient complains of left lower leg pain. Patient has a history of sickle cell and states this is a painful crisis. She has been taking oxycodone without relief.  The history is provided by the patient. No language interpreter was used.  Sickle Cell Pain Crisis Pain location: Left lower leg. Severity:  Moderate Onset quality:  Sudden Similar to previous crisis episodes: yes   Timing:  Constant Progression:  Improving Chronicity:  New Sickle cell genotype:  SS History of pulmonary emboli: no   Context: not alcohol consumption   Relieved by:  Nothing Worsened by:  Nothing Ineffective treatments:  None tried Associated symptoms: no chest pain, no congestion, no cough, no fatigue and no headaches   Leg Pain Associated symptoms: no back pain and no fatigue        Past Medical History:  Diagnosis Date  . Depression   . Migraine without aura, without mention of intractable migraine without mention of status migrainosus   . Sickle cell anemia (HCC)   . Urinary tract infection July 2013   frequent     Patient Active Problem List   Diagnosis Date Noted  . Sickle cell anemia with crisis (HCC) 03/10/2019  . S/P postpartum abdominal supracervical hysterectomy 12/01/2018  . Postpartum hemorrhage 12/01/2018  . Hypovolemic shock (HCC) 12/01/2018  . Postoperative ileus (HCC) 12/01/2018  . AKI (acute kidney injury) (HCC) 11/23/2018  . Abnormal transaminases 11/23/2018  . Sickle cell disease (HCC) 11/22/2018  . Maternal sickle cell anemia affecting pregnancy, antepartum (HCC) 05/11/2018  . Supervision of normal first pregnancy, antepartum 05/04/2018  . Vitamin D deficiency 06/06/2015  . Migraine without aura 12/18/2013  . 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 Sickle cell anemia (HCC) 06/17/2012  . Depression, major, single episode, moderate (HCC) 07/31/2011  . Generalized anxiety disorder 07/31/2011    Past Surgical History:  Procedure Laterality Date  . ABDOMINAL HYSTERECTOMY N/A 11/23/2018   Procedure: HYSTERECTOMY ABDOMINAL;  Surgeon: Wallace Ridge Bing, MD;  Location: [facility] LD ORS;  Service: Gynecology;  Laterality: N/A;  . CYSTOSCOPY N/A 11/23/2018   Procedure: CYSTOSCOPY;  Surgeon: S[place] Bing, MD;  Location: [facility] LD ORS;  Service: Gynecology;  Laterality: N/A;  . DILATION AND CURETTAGE OF UTERUS N/A 11/23/2018   Procedure: DILATATION AND CURETTAGE;  Surgeon: [place] Bing, MD;  Location: [facility] LD ORS;  Service: Gynecology;  Laterality: N/A;  . LAPAROTOMY N/A 11/23/2018   Procedure: EXPLORATORY LAPAROTOMY;  Surgeon: W[place]kon Bing, MD;  Location: [facility] LD ORS;  Service: Gynecology;  Laterality: N/A;  . NO PAST SURGERIES    . WISDOM TOOTH EXTRACTION       OB History    Gravida  3   Para  1   Term  1   Preterm      AB  2   Living  1     SAB  2   TAB      Ectopic      Multiple  0   Live Births  1           Family History  Problem Relation Age of Onset  . Migraines Father     Social History   Tobacco Use  .  Smoking status: Never Smoker  . Smokeless tobacco: Never Used  Vaping Use  . Vaping Use: Never used  Substance Use Topics  . Alcohol use: Not Currently    Comment: Wine  . Drug use: No    Home Medications Prior to Admission medications   Medication Sig Start Date End Date Taking? Authorizing Provider  sertraline (ZOLOFT) 25 MG tablet Take 25 mg by mouth at bedtime.  11/03/19  Yes [provider]  ferrous sulfate 325 (65 FE) MG tablet TAKE 1 TABLET BY MOUTH TWICE A DAY Patient not taking: Reported on 04/12/2019 11/23/18   Brock Bad, MD  folic acid (FOLVITE) 1 MG tablet TAKE 1 TABLET (1 MG TOTAL) BY MOUTH DAILY. Patient not taking:  Reported on 11/22/2018 03/08/17   Massie Maroon, FNP  ibuprofen (ADVIL) 800 MG tablet Take 1 tablet (800 mg total) by mouth 3 (three) times daily. 12/27/19   Bethann Berkshire, MD  polyethylene glycol (MIRALAX / GLYCOLAX) 17 g packet Take 17 g by mouth daily as needed for moderate constipation or severe constipation. Patient not taking: Reported on 12/09/2018 12/01/18   Tereso Newcomer, MD    Allergies    Patient has no known allergies.  Review of Systems   Review of Systems  Constitutional: Negative for appetite change and fatigue.  HENT: Negative for congestion, ear discharge and sinus pressure.   Eyes: Negative for discharge.  Respiratory: Negative for cough.   Cardiovascular: Negative for chest pain.  Gastrointestinal: Negative for abdominal pain and diarrhea.  Genitourinary: Negative for frequency and hematuria.  Musculoskeletal: Negative for back pain.       Left lower leg pain  Skin: Negative for rash.  Neurological: Negative for seizures and headaches.  Psychiatric/Behavioral: Negative for hallucinations.    Physical Exam Updated Vital Signs BP 111/77   Pulse 85   Temp 98.3 F (36.8 C)   Resp 17   LMP 02/21/2018   SpO2 100%   Physical Exam Vitals and nursing note reviewed.  Constitutional:      Appearance: She is well-developed.  HENT:     Head: Normocephalic.     Nose: Nose normal.  Eyes:     General: No scleral icterus.    Conjunctiva/sclera: Conjunctivae normal.  Neck:     Thyroid: No thyromegaly.  Cardiovascular:     Rate and Rhythm: Normal rate and regular rhythm.     Heart sounds: No murmur heard.  No friction rub. No gallop.   Pulmonary:     Breath sounds: No stridor. No wheezing or rales.  Chest:     Chest wall: No tenderness.  Abdominal:     General: There is no distension.     Tenderness: There is no abdominal tenderness. There is no rebound.  Musculoskeletal:        General: Normal range of motion.     Cervical back: Neck supple.      Comments: Tender left lower leg but no swelling no calf tenderness. pain seems to be more anterior  Lymphadenopathy:     Cervical: No cervical adenopathy.  Skin:    Findings: No erythema or rash.  Neurological:     Mental Status: She is alert and oriented to person, place, and time.     Motor: No abnormal muscle tone.     Coordination: Coordination normal.  Psychiatric:        Behavior: Behavior normal.     ED Results / Procedures / Treatments   Labs (all labs  ordered are listed, but only abnormal results are displayed) Labs Reviewed  CBC WITH DIFFERENTIAL/PLATELET - Abnormal; Notable for the following components:      Result Value   RBC 3.86 (*)    Hemoglobin 11.4 (*)    HCT 31.4 (*)    MCHC 36.3 (*)    Platelets 117 (*)    All other components within normal limits  COMPREHENSIVE METABOLIC PANEL - Abnormal; Notable for the following components:   Total Protein 8.6 (*)    All other components within normal limits  RETICULOCYTES - Abnormal; Notable for the following components:   RBC. 3.85 (*)    Immature Retic Fract 20.0 (*)    All other components within normal limits    EKG None  Radiology No results found.  Procedures Procedures (including critical care time)  Medications Ordered in ED Medications  HYDROmorphone (DILAUDID) injection 0.5 mg (has no administration in time range)  HYDROmorphone (DILAUDID) injection 1 mg (1 mg Intravenous Given 12/27/19 0935)  sodium chloride 0.9 % bolus 1,000 mL (0 mLs Intravenous Stopped 12/27/19 1100)  ondansetron (ZOFRAN) injection 4 mg (4 mg Intravenous Given 12/27/19 0935)  ketorolac (TORADOL) 30 MG/ML injection 30 mg (30 mg Intravenous Given 12/27/19 1044)    ED Course  I have reviewed the triage vital signs and the nursing notes.  Pertinent labs & imaging results that were available during my care of the patient were reviewed by me and considered in my medical decision making (see chart for details).    MDM  Rules/Calculators/A&P                          Patient with sickle cell crisis. Symptoms improved with Dilaudid and Toradol. She will be sent home with Motrin and will continue taking her oxycodone         This patient presents to the ED for concern of left lower leg pain, this involves an extensive number of treatment options, and is a complaint that carries with it a high risk of complications and morbidity.  The differential diagnosis includes sickle pain   Lab Tests:   I Ordered, reviewed, and interpreted labs, which included CBC and chemistries. Patient has mild anemia  Medicines ordered:   I ordered medication Toradol and oxycodone  Imaging Studies ordered:     Additional history obtained:   Additional history obtained from records  Previous records obtained and reviewed.  Consultations Obtained:   Reevaluation:  After the interventions stated above, I reevaluated the patient and found improved  Critical Interventions:  .   Final Clinical Impression(s) / ED Diagnoses Final diagnoses:  Sickle cell pain crisis (HCC)    Rx / DC Orders ED Discharge Orders         Ordered    ibuprofen (ADVIL) 800 MG tablet  3 times daily     Discontinue  Reprint     12/27/19 1136           Bethann Berkshire, MD 12/27/19 1141

## 2019-12-27 NOTE — ED Triage Notes (Signed)
Pt c/o left leg sickle cell pain that started last night.

## 2019-12-29 ENCOUNTER — Encounter (HOSPITAL_COMMUNITY): Payer: Self-pay | Admitting: Family Medicine

## 2019-12-29 ENCOUNTER — Non-Acute Institutional Stay (HOSPITAL_COMMUNITY)
Admission: AD | Admit: 2019-12-29 | Discharge: 2019-12-29 | Disposition: A | Discharge status: Home / Self Care | Payer: Medicaid Other | Source: Ambulatory Visit | Attending: Internal Medicine | Admitting: Internal Medicine

## 2019-12-29 ENCOUNTER — Telehealth (HOSPITAL_COMMUNITY): Payer: Self-pay | Admitting: General Practice

## 2019-12-29 DIAGNOSIS — Z79899 Other long term (current) drug therapy: Secondary | ICD-10-CM | POA: Insufficient documentation

## 2019-12-29 DIAGNOSIS — F329 Major depressive disorder, single episode, unspecified: Secondary | ICD-10-CM | POA: Insufficient documentation

## 2019-12-29 DIAGNOSIS — D5700 Hb-SS disease with crisis, unspecified: Secondary | ICD-10-CM | POA: Diagnosis present

## 2019-12-29 DIAGNOSIS — D571 Sickle-cell disease without crisis: Secondary | ICD-10-CM | POA: Diagnosis present

## 2019-12-29 LAB — COMPREHENSIVE METABOLIC PANEL
ALT: 13 U/L (ref 0–44)
AST: 21 U/L (ref 15–41)
Albumin: 4.8 g/dL (ref 3.5–5.0)
Alkaline Phosphatase: 62 U/L (ref 38–126)
Anion gap: 8 (ref 5–15)
BUN: <5 mg/dL — ABNORMAL LOW (ref 6–20)
CO2: 26 mmol/L (ref 22–32)
Calcium: 9 mg/dL (ref 8.9–10.3)
Chloride: 103 mmol/L (ref 98–111)
Creatinine, Ser: 0.62 mg/dL (ref 0.44–1.00)
GFR calc Af Amer: >60 mL/min (ref >60)
GFR calc non Af Amer: >60 mL/min (ref >60)
Glucose, Bld: 102 mg/dL — ABNORMAL HIGH (ref 70–99)
Potassium: 3.7 mmol/L (ref 3.5–5.1)
Sodium: 137 mmol/L (ref 135–145)
Total Bilirubin: 1.4 mg/dL — ABNORMAL HIGH (ref 0.3–1.2)
Total Protein: 8.5 g/dL — ABNORMAL HIGH (ref 6.5–8.1)

## 2019-12-29 LAB — CBC WITH DIFFERENTIAL/PLATELET
Abs Immature Granulocytes: 0.01 10*3/uL (ref 0.00–0.07)
Basophils Absolute: 0 10*3/uL (ref 0.0–0.1)
Basophils Relative: 0 %
Eosinophils Absolute: 0 10*3/uL (ref 0.0–0.5)
Eosinophils Relative: 1 %
HCT: 29.4 % — ABNORMAL LOW (ref 36.0–46.0)
Hemoglobin: 10.7 g/dL — ABNORMAL LOW (ref 12.0–15.0)
Immature Granulocytes: 0 %
Lymphocytes Relative: 17 %
Lymphs Abs: 0.9 10*3/uL (ref 0.7–4.0)
MCH: 30.1 pg (ref 26.0–34.0)
MCHC: 36.4 g/dL — ABNORMAL HIGH (ref 30.0–36.0)
MCV: 82.6 fL (ref 80.0–100.0)
Monocytes Absolute: 0.5 10*3/uL (ref 0.1–1.0)
Monocytes Relative: 9 %
Neutro Abs: 4.2 10*3/uL (ref 1.7–7.7)
Neutrophils Relative %: 73 %
Platelets: 98 10*3/uL — ABNORMAL LOW (ref 150–400)
RBC: 3.56 MIL/uL — ABNORMAL LOW (ref 3.87–5.11)
RDW: 14 % (ref 11.5–15.5)
WBC: 5.7 10*3/uL (ref 4.0–10.5)
nRBC: 0 % (ref 0.0–0.2)

## 2019-12-29 LAB — RETICULOCYTES
Immature Retic Fract: 12.3 % (ref 2.3–15.9)
RBC.: 3.63 MIL/uL — ABNORMAL LOW (ref 3.87–5.11)
Retic Count, Absolute: 72.2 10*3/uL (ref 19.0–186.0)
Retic Ct Pct: 2 % (ref 0.4–3.1)

## 2019-12-29 MED ORDER — SODIUM CHLORIDE 0.9% FLUSH: 9.0000 mL | INTRAVENOUS | Status: DC | PRN

## 2019-12-29 MED ORDER — ACETAMINOPHEN 500 MG PO TABS
1000.0000 mg | ORAL_TABLET | Freq: Once | ORAL | Status: AC
  Administered 2019-12-29: 1000 mg via ORAL
  Filled 2019-12-29: qty 2

## 2019-12-29 MED ORDER — ONDANSETRON HCL 4 MG/2ML IJ SOLN
4.0000 mg | Freq: Four times a day (QID) | INTRAMUSCULAR | Status: DC | PRN
  Administered 2019-12-29: 4 mg via INTRAVENOUS
  Filled 2019-12-29: qty 2

## 2019-12-29 MED ORDER — NALOXONE HCL 0.4 MG/ML IJ SOLN: 0.4000 mg | INTRAMUSCULAR | Status: DC | PRN

## 2019-12-29 MED ORDER — DIPHENHYDRAMINE HCL 12.5 MG/5ML PO ELIX: 12.5000 mg | ORAL_SOLUTION | Freq: Four times a day (QID) | ORAL | Status: DC | PRN

## 2019-12-29 MED ORDER — DIPHENHYDRAMINE HCL 50 MG/ML IJ SOLN: 12.5000 mg | Freq: Four times a day (QID) | INTRAMUSCULAR | Status: DC | PRN

## 2019-12-29 MED ORDER — DEXTROSE-NACL 5-0.45 % IV SOLN: INTRAVENOUS | Status: DC

## 2019-12-29 MED ORDER — HYDROMORPHONE 1 MG/ML IV SOLN
INTRAVENOUS | Status: DC
  Administered 2019-12-29: 30 mg via INTRAVENOUS
  Administered 2019-12-29: 3.2 mg via INTRAVENOUS
  Filled 2019-12-29: qty 30

## 2019-12-29 MED ORDER — KETOROLAC TROMETHAMINE 30 MG/ML IJ SOLN
15.0000 mg | Freq: Once | INTRAMUSCULAR | Status: AC
  Administered 2019-12-29: 15 mg via INTRAVENOUS
  Filled 2019-12-29: qty 1

## 2019-12-29 NOTE — Progress Notes (Signed)
Patient admitted to the day hospital for treatment of sickle cell pain crisis. Patient reported pain rated 9/10 in the left leg. Placed on Dilaudid PCA, given PO Tylenol, IV Toradol, IV Zofran and hydrated with IV fluids. At discharge patient reported  pain at 3/10. Discharge instructions given to the patient. Patient alert, oriented and ambulatory at discharge.

## 2019-12-29 NOTE — Discharge Summary (Signed)
[facility] Discharge Summary   Patient ID: Makayla Fletcher MRN: [id number] DOB/AGE: [birth year] 24 y.o.  Admit date: 12/29/2019 Discharge date: 12/29/2019  Primary Care Physician:  Barbette Merino, NP  Admission Diagnoses:  Active Problems:   Sickle cell pain crisis [facility])   Discharge Medications:  Allergies as of 12/29/2019   No Known Allergies     Medication List    TAKE these medications   ferrous sulfate 325 (65 FE) MG tablet TAKE 1 TABLET BY MOUTH TWICE A DAY   folic acid 1 MG tablet Commonly known as: FOLVITE TAKE 1 TABLET (1 MG TOTAL) BY MOUTH DAILY.   ibuprofen 800 MG tablet Commonly known as: ADVIL Take 1 tablet (800 mg total) by mouth 3 (three) times daily.   oxyCODONE 5 MG immediate release tablet Commonly known as: Roxicodone Take 1 tablet (5 mg total) by mouth every 4 (four) hours as needed for up to 15 days for severe pain.   polyethylene glycol 17 g packet Commonly known as: MIRALAX / GLYCOLAX Take 17 g by mouth daily as needed for moderate constipation or severe constipation.   sertraline 25 MG tablet Commonly known as: ZOLOFT Take 25 mg by mouth at bedtime.        Consults:  None  Significant Diagnostic Studies:  No results found.  History of present illness:  Makayla Fletcher is a 24 year old female with a medical history significant for sickle cell disease that presents complaining of left leg pain that is not consistent with previous sickle cell pain crisis.  Patient typically has well-controlled sickle cell disease with infrequent pain crisis.  She states that she was in usual state of health until 2 days prior.  She worked her normal shift at her job and began to experience left lower extremity pain characterized as constant and throbbing.  Pain was unrelieved by home oxycodone, Tylenol, and ibuprofen last taken this a.m. without improvement.  Patient rates pain as 9/10.  She denies fever, chills, chest pain, shortness of  breath, headache, dizziness, urinary symptoms, nausea, vomiting, or diarrhea.  No sick contacts, recent travel, or exposure to COVID-19.  [facility] Course: Patient admitted to sickle cell day infusion center for management of pain crisis.  Reviewed all laboratory values appear to be consistent with patient's baseline.  On review, patient has a history of thrombocytopenia.  Today platelets 98.  Patient advised to follow-up with PCP, may warrant referral to hematologist for further investigation.  Pain managed with IV Dilaudid via PCA, full dose.  Patient is opiate nave. Toradol 15 mg IV x1 Tylenol 1000 mg x 1 IV fluids, 0.45% saline at 125 mL/h. Pain intensity decreased to 3/10, patient does not warrant admission at this time.  She is alert, oriented, and ambulating without assistance.  Patient will discharge home in a hemodynamically stable condition.  Discharge instructions: Resume all home medications.   Follow up with PCP as previously  scheduled.   Discussed the importance of drinking 64 ounces of water daily, dehydration of red blood cells may lead further sickling.   Avoid all stressors that precipitate sickle cell pain crisis.     The patient was given clear instructions to go to ER or return to medical center if symptoms do not improve, worsen or new problems develop.    Physical Exam at Discharge:  BP 106/69 (BP Location: Right Arm)   Pulse 86   Temp 98 F (36.7 C) (Oral)   Resp 13  LMP 02/21/2018   SpO2 100%  Physical Exam Constitutional:      Appearance: Normal appearance.  Eyes:     Pupils: Pupils are equal, round, and reactive to light.  Cardiovascular:     Rate and Rhythm: Normal rate and regular rhythm.     Pulses: Normal pulses.  Pulmonary:     Effort: Pulmonary effort is normal.     Breath sounds: Normal breath sounds.  Abdominal:     General: Bowel sounds are normal.  Musculoskeletal:        General: Normal range of motion.  Skin:     General: Skin is warm.  Neurological:     General: No focal deficit present.     Mental Status: She is alert. Mental status is at baseline.  Psychiatric:        Mood and Affect: Mood normal.        Behavior: Behavior normal.        Thought Content: Thought content normal.        Judgment: Judgment normal.       Disposition at Discharge: Discharge disposition: 01-Home or Self Care       Discharge Orders: Discharge Instructions    Discharge patient   Complete by: As directed    Discharge disposition: 01-Home or Self Care   Discharge patient date: 12/29/2019      Condition at Discharge:   Stable  Time spent on Discharge:  Greater than 30 minutes.  Signed: Nolon Nations  APRN, MSN, FNP-C Patient Care [facility] [address] [place], [place] [address] [phone]  12/29/2019, 2:46 PM

## 2019-12-29 NOTE — Telephone Encounter (Signed)
Patient's mother called on behalf of the patient, requesting to come to the day hospital due to pain in the left leg rated at 10/10. Denied chest pain, fever, diarrhea, abdominal pain, nausea/vomitting. Screened negative for Covid-19 symptoms. Admitted to having means of transportation without driving self after treatment. Last took 10 mg of oxycodone at 06:30 am today and 1000 mg of OTC Tylenol at 04:00 am today. Per provider, patient can come to the day hospital for treatment. Patient notified, verbalized understanding.

## 2019-12-29 NOTE — H&P (Signed)
[facility] History and Physical   Date: 12/29/2019  Patient name: Makayla Fletcher Medical record number: [id number] Date of birth: 1996-04-22 Age: 24 y.o. Gender: female PCP: Barbette Merino, NP  Attending physician: Quentin Angst, MD  Chief Complaint: Sickle cell pain  History of Present Illness: Makayla Fletcher is a 24 year old female with a medical history significant for sickle cell disease that presents complaining of left leg pain that is not consistent with previous sickle cell pain crisis.  Patient typically has well-controlled sickle cell disease with infrequent pain crisis.  She states that she was in usual state of health until 2 days prior.  She worked her normal shift at her job and began to experience left lower extremity pain characterized as constant and throbbing.  Pain was unrelieved by home oxycodone, Tylenol, and ibuprofen last taken this a.m. without improvement.  Patient rates pain as 9/10.  She denies fever, chills, chest pain, shortness of breath, headache, dizziness, urinary symptoms, nausea, vomiting, or diarrhea.  No sick contacts, recent travel, or exposure to COVID-19.  Meds: Medications Prior to Admission  Medication Sig Dispense Refill Last Dose  . ferrous sulfate 325 (65 FE) MG tablet TAKE 1 TABLET BY MOUTH TWICE A DAY (Patient not taking: Reported on 04/12/2019) 60 tablet 5   . folic acid (FOLVITE) 1 MG tablet TAKE 1 TABLET (1 MG TOTAL) BY MOUTH DAILY. (Patient not taking: Reported on 11/22/2018) 30 tablet 6   . ibuprofen (ADVIL) 800 MG tablet Take 1 tablet (800 mg total) by mouth 3 (three) times daily. 21 tablet 0   . oxyCODONE (ROXICODONE) 5 MG immediate release tablet Take 1 tablet (5 mg total) by mouth every 4 (four) hours as needed for up to 15 days for severe pain. 60 tablet 0   . polyethylene glycol (MIRALAX / GLYCOLAX) 17 g packet Take 17 g by mouth daily as needed for moderate constipation or severe constipation. (Patient not  taking: Reported on 12/09/2018) 14 each 1   . sertraline (ZOLOFT) 25 MG tablet Take 25 mg by mouth at bedtime.        Allergies: Patient has no known allergies. Past Medical History:  Diagnosis Date  . Depression   . Migraine without aura, without mention of intractable migraine without mention of status migrainosus   . Sickle cell anemia (HCC)   . Urinary tract infection July 2013   frequent    Past Surgical History:  Procedure Laterality Date  . ABDOMINAL HYSTERECTOMY N/A 11/23/2018   Procedure: HYSTERECTOMY ABDOMINAL;  Surgeon: Chili Bing, MD;  Location: [facility] LD [facility]S;  Service: Gynecology;  Laterality: N/A;  . CYSTOSCOPY N/A 11/23/2018   Procedure: CYSTOSCOPY;  Surgeon: [place] Bing, MD;  Location: [facility]S;  Service: Gynecology;  Laterality: N/A;  . DILATION AND CURETTAGE OF UTERUS N/A 11/23/2018   Procedure: DILATATION AND CURETTAGE;  Surgeon: [place]rey Bing, MD;  Location: [facility];  Service: Gynecology;  Laterality: N/A;  . LAPAROTOMY N/A 11/23/2018   Procedure: EXPLORATORY LAPAROTOMY;  Surgeon: [place] Bing, MD;  Location: [facility];  Service: Gynecology;  Laterality: N/A;  . NO PAST SURGERIES    . WISDOM TOOTH EXTRACTION     Family History  Problem Relation Age of Onset  . Migraines Father    Social History   Socioeconomic History  . Marital status: Single    Spouse name: Not on file  . Number of children: 0  . Years of education: college  . Highest education level:  Not on file  Occupational History  . Occupation: MINOR    Employer: UNEMPLOYED  . Occupation: Consulting civil engineer    Comment: Engineer, water at [employer]  . Occupation: MENTOR/TEACHER    Employer: [employer]  Tobacco Use  . Smoking status: Never Smoker  . Smokeless tobacco: Never Used  Vaping Use  . Vaping Use: Never used  Substance and Sexual Activity  . Alcohol use: Not Currently    Comment: Wine  . Drug use: No  . Sexual activity: Not Currently    Partners: Male    Birth control/protection:  None    Comment: Depoprovera   Other Topics Concern  . Not on file  Social History Narrative  . Not on file   Social Determinants of Health   Financial Resource Strain:   . Difficulty of Paying Living Expenses:   Food Insecurity:   . Worried About Programme researcher, broadcasting/film/video in the Last Year:   . Barista in the Last Year:   Transportation Needs:   . Freight forwarder (Medical):   Marland Kitchen Lack of Transportation (Non-Medical):   Physical Activity:   . Days of Exercise per Week:   . Minutes of Exercise per Session:   Stress:   . Feeling of Stress :   Social Connections:   . Frequency of Communication with Friends and Family:   . Frequency of Social Gatherings with Friends and Family:   . Attends Religious Services:   . Active Member of Clubs or Organizations:   . Attends Banker Meetings:   Marland Kitchen Marital Status:   Intimate Partner Violence:   . Fear of Current or Ex-Partner:   . Emotionally Abused:   Marland Kitchen Physically Abused:   . Sexually Abused:    Review of Systems  Constitutional: Negative for chills and fever.  HENT: Negative.   Eyes: Negative.   Respiratory: Negative.   Cardiovascular: Negative.   Gastrointestinal: Negative.   Genitourinary: Negative.   Musculoskeletal: Positive for joint pain.  Neurological: Negative.   Psychiatric/Behavioral: Negative.      Physical Exam: Last menstrual period 02/21/2018, not currently breastfeeding. Physical Exam Constitutional:      General: She is not in acute distress.    Appearance: Normal appearance. She is not toxic-appearing.  HENT:     Mouth/Throat:     Mouth: Mucous membranes are moist.  Eyes:     Pupils: Pupils are equal, round, and reactive to light.  Cardiovascular:     Rate and Rhythm: Normal rate and regular rhythm.     Pulses: Normal pulses.  Pulmonary:     Effort: Pulmonary effort is normal.     Breath sounds: Normal breath sounds.  Abdominal:     General: Abdomen is flat. Bowel sounds are  normal.  Neurological:     General: No focal deficit present.     Mental Status: She is alert and oriented to person, place, and time. Mental status is at baseline.  Psychiatric:        Mood and Affect: Mood normal.        Behavior: Behavior normal.        Thought Content: Thought content normal.        Judgment: Judgment normal.      Lab results: No results found for this or any previous visit (from the past 24 hour(s)).  Imaging results:  No results found.   Assessment & Plan: Patient admitted to sickle cell day infusion center for management of pain crisis.  Patient is opiate naive Initiate IV dilaudid PCA per full dose IV fluids, D5.45% saline at 125 ml/hr Toradol 15 mg IV times one dose Tylenol 1000 mg by mouth times one dose Review CBC with differential, complete metabolic panel, and reticulocytes as results become available.  Pain intensity will be reevaluated in context of functioning and relationship to baseline as care progress If pain intensity remains elevated and/or sudden change in hemodynamic stability transition to inpatient services for higher level of care.    Nolon Nations  APRN, MSN, FNP-C Patient Care [facility] [address] [place], [place] [address] [phone]   12/29/2019, 9:18 AM

## 2019-12-29 NOTE — Discharge Instructions (Signed)
Sickle Cell Anemia, Adult  Sickle cell anemia is a condition where your red blood cells are shaped like sickles. Red blood cells carry oxygen through the body. Sickle-shaped cells do not live as long as normal red blood cells. They also clump together and block blood from flowing through the blood vessels. This prevents the body from getting enough oxygen. Sickle cell anemia causes organ damage and pain. It also increases the risk of infection. Follow these instructions at home: Medicines  Take over-the-counter and prescription medicines only as told by your doctor.  If you were prescribed an antibiotic medicine, take it as told by your doctor. Do not stop taking the antibiotic even if you start to feel better.  If you develop a fever, do not take medicines to lower the fever right away. Tell your doctor about the fever. Managing pain, stiffness, and swelling  Try these methods to help with pain: ? Use a heating pad. ? Take a warm bath. ? Distract yourself, such as by watching TV. Eating and drinking  Drink enough fluid to keep your pee (urine) clear or pale yellow. Drink more in hot weather and during exercise.  Limit or avoid alcohol.  Eat a healthy diet. Eat plenty of fruits, vegetables, whole grains, and lean protein.  Take vitamins and supplements as told by your doctor. Traveling  When traveling, keep these with you: ? Your medical information. ? The names of your doctors. ? Your medicines.  If you need to take an airplane, talk to your doctor first. Activity  Rest often.  Avoid exercises that make your heart beat much faster, such as jogging. General instructions  Do not use products that have nicotine or tobacco, such as cigarettes and e-cigarettes. If you need help quitting, ask your doctor.  Consider wearing a medical alert bracelet.  Avoid being in high places (high altitudes), such as mountains.  Avoid very hot or cold temperatures.  Avoid places where the  temperature changes a lot.  Keep all follow-up visits as told by your doctor. This is important. Contact a doctor if:  A joint hurts.  Your feet or hands hurt or swell.  You feel tired (fatigued). Get help right away if:  You have symptoms of infection. These include: ? Fever. ? Chills. ? Being very tired. ? Irritability. ? Poor eating. ? Throwing up (vomiting).  You feel dizzy or faint.  You have new stomach pain, especially on the left side.  You have a an erection (priapism) that lasts more than 4 hours.  You have numbness in your arms or legs.  You have a hard time moving your arms or legs.  You have trouble talking.  You have pain that does not go away when you take medicine.  You are short of breath.  You are breathing fast.  You have a long-term cough.  You have pain in your chest.  You have a bad headache.  You have a stiff neck.  Your stomach looks bloated even though you did not eat much.  Your skin is pale.  You suddenly cannot see well. Summary  Sickle cell anemia is a condition where your red blood cells are shaped like sickles.  Follow your doctor's advice on ways to manage pain, food to eat, activities to do, and steps to take for safe travel.  Get medical help right away if you have any signs of infection, such as a fever. This information is not intended to replace advice given to you by   your health care provider. Make sure you discuss any questions you have with your health care provider. Document Revised: 09/30/2018 Document Reviewed: 07/14/2016 Elsevier Patient Education  2020 Elsevier Inc.  

## 2020-01-29 IMAGING — CT CT ABD-PELV W/O CM
2 of 4 series · 16 of 46 positions shown, 18 images · non-contrast
Comparison: 11/23/2018

CLINICAL DATA: Abdominal pain and distention. Tender palpable
abnormality near incision site. 3 months postop from supracervical
hysterectomy. Sickle cell disease.

EXAM:
CT ABDOMEN AND PELVIS WITHOUT CONTRAST
TECHNIQUE: Multidetector CT imaging of the abdomen and pelvis was performed
following the standard protocol without IV contrast.

[Series 2: axial st · axial · 0.56mm/px · z∈[-682,-312]mm · 13 of 84 slices shown, 15 images]
[im 5/84  soft-tissue]
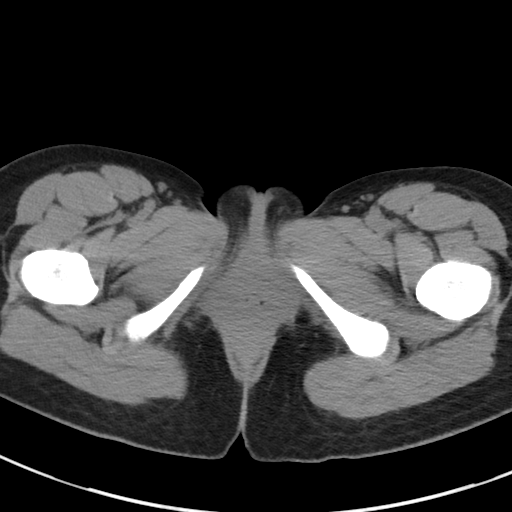
[im 5/84  bone]
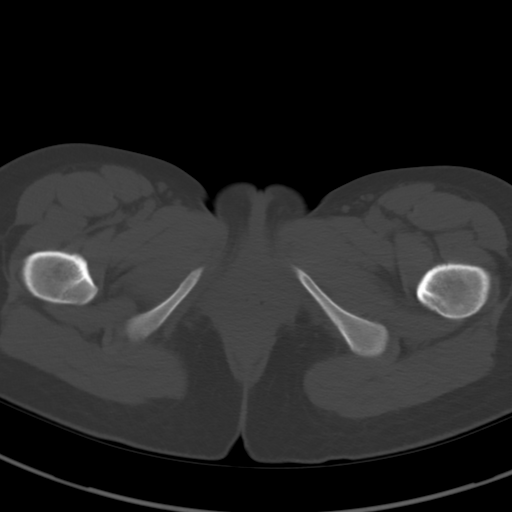
[im 10/84  soft-tissue]
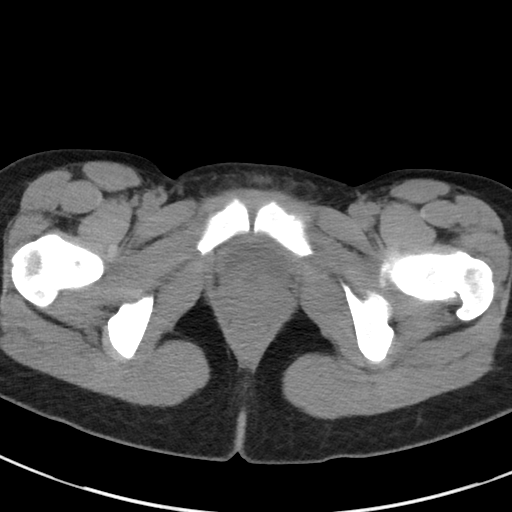
[im 19/84  soft-tissue]
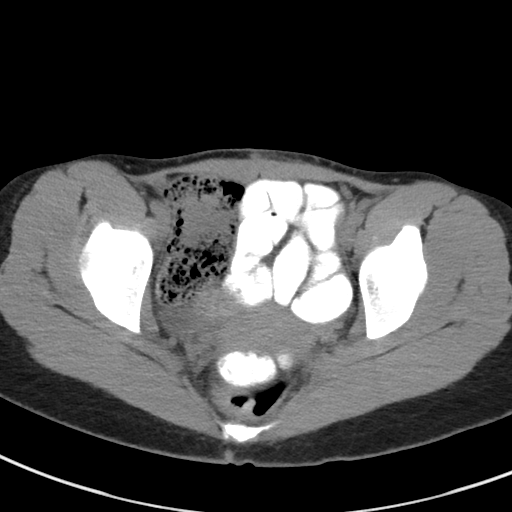
[im 24/84  soft-tissue]
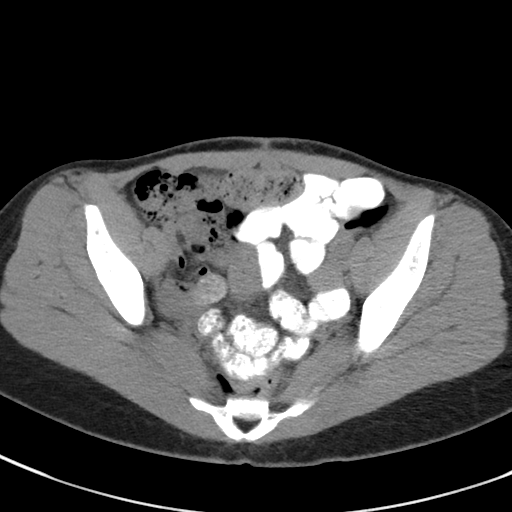
[im 28/84  soft-tissue]
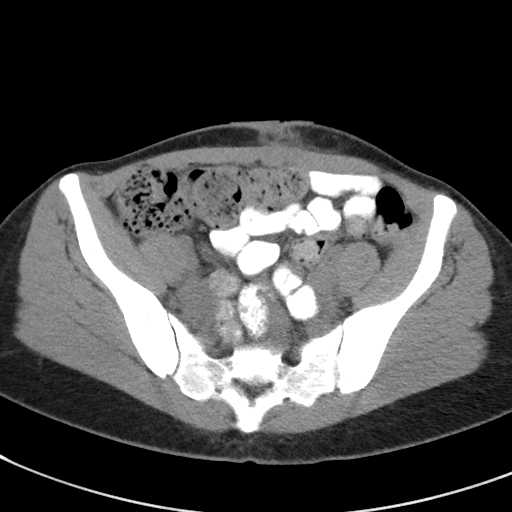
[im 37/84  soft-tissue]
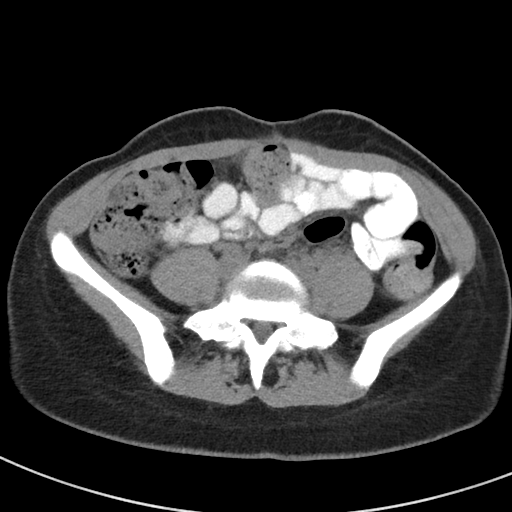
[im 42/84  soft-tissue]
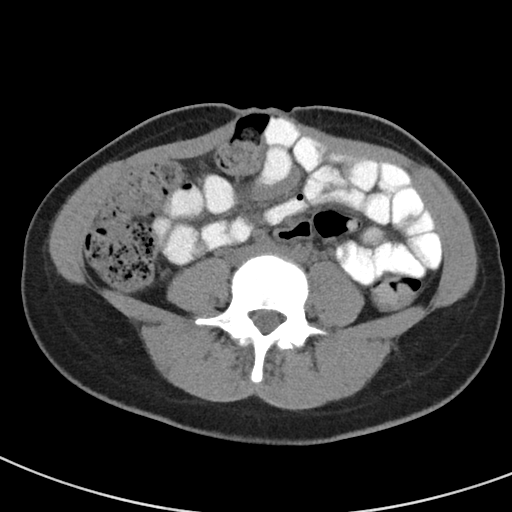
[im 47/84  soft-tissue]
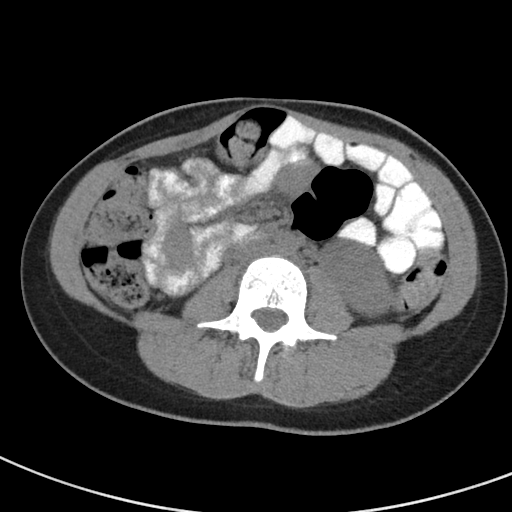
[im 56/84  soft-tissue]
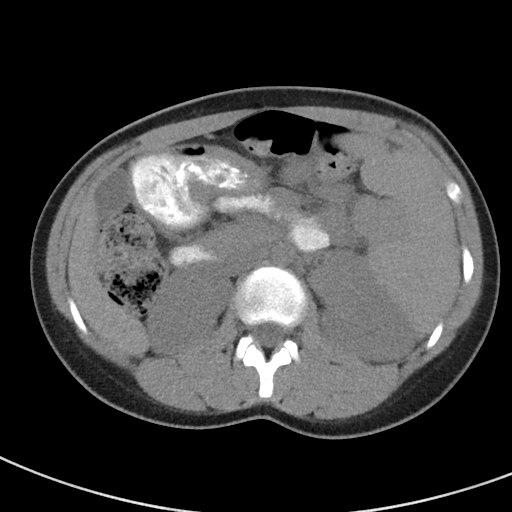
[im 56/84  bone]
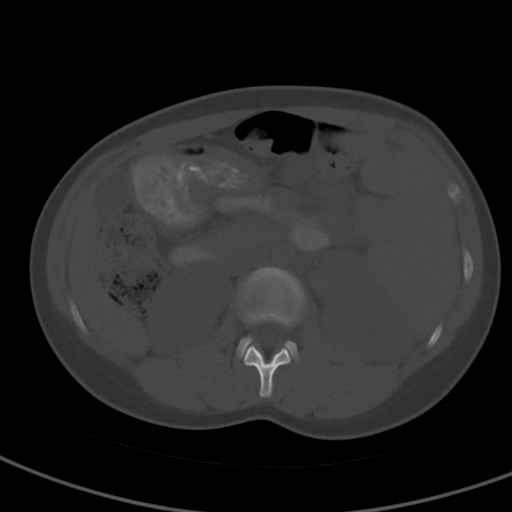
[im 60/84  soft-tissue]
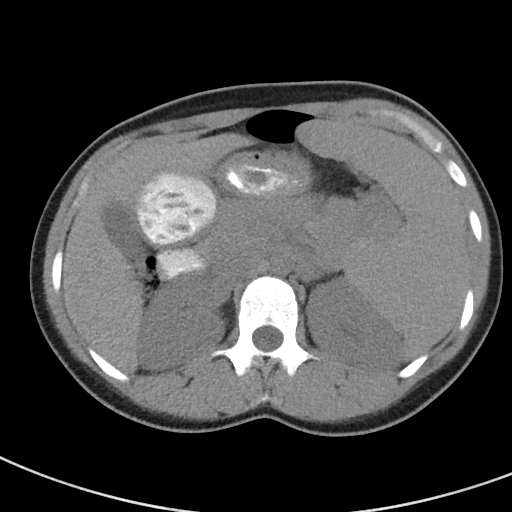
[im 65/84  soft-tissue]
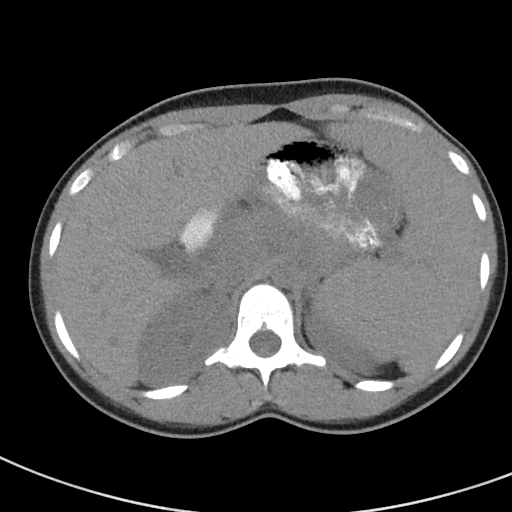
[im 74/84  soft-tissue]
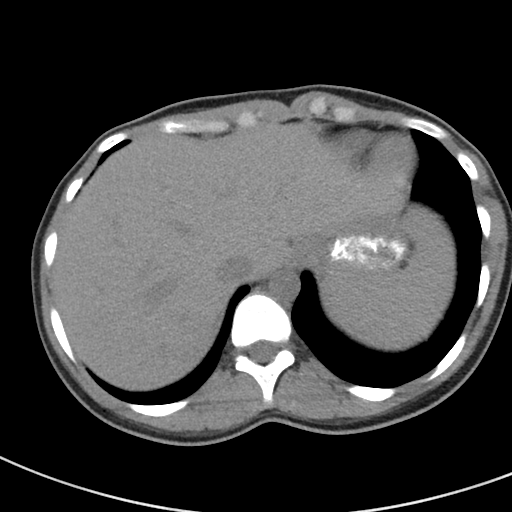
[im 79/84  soft-tissue]
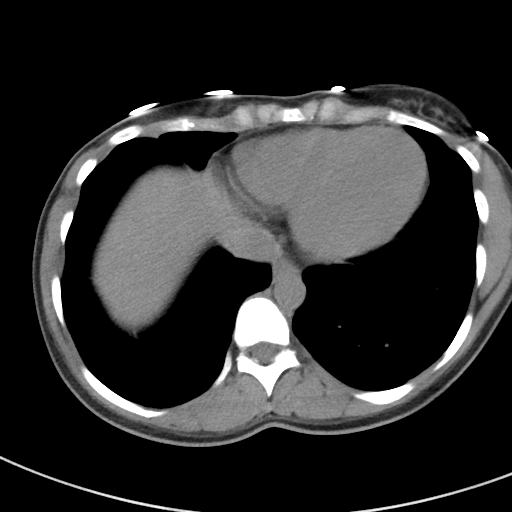

[Series 5: coronal st · coronal · 0.64mm/px · 3 of 72 slices shown]
[im 24/72  soft-tissue]
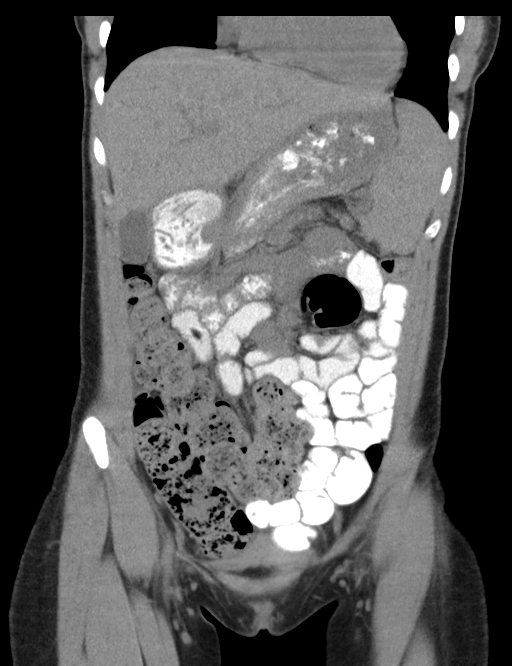
[im 32/72  soft-tissue]
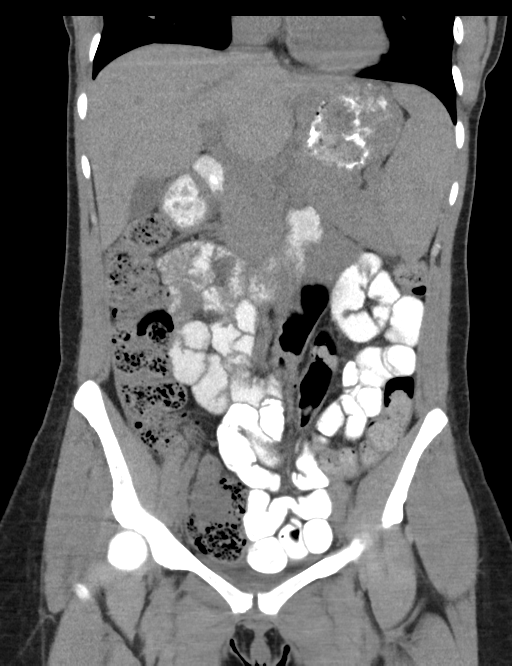
[im 40/72  soft-tissue]
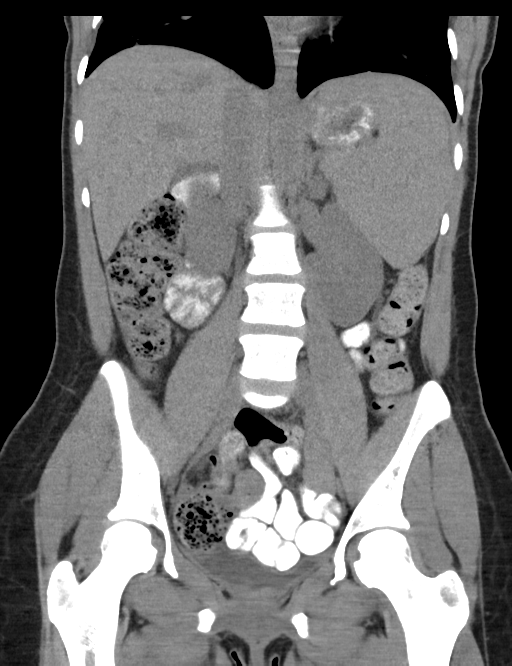

[16 of 46 positions shown; findings below may reference images not displayed]

FINDINGS: Lower chest: No acute findings.

Hepatobiliary: No mass visualized on this unenhanced exam.
Gallbladder is unremarkable. No evidence of biliary ductal
dilatation.

Pancreas: No mass or inflammatory process visualized on this
unenhanced exam.

Spleen:  Stable moderate splenomegaly.

Adrenals/Urinary tract: No evidence of urolithiasis or
hydronephrosis.

Stomach/Bowel: No evidence of obstruction, inflammatory process, or
abnormal fluid collections.

Vascular/Lymphatic: No pathologically enlarged lymph nodes
identified. No evidence of abdominal aortic aneurysm.

Reproductive: Expected postop changes from supracervical
hysterectomy. No evidence of mass, inflammatory process, or abnormal
fluid collections. Hemoperitoneum has resolved since previous study.
Large colonic stool burden is noted.

Other: Soft tissue stranding is seen in the subcutaneous fat near
the umbilicus, however there is no evidence of hernia, mass, or
fluid collection.

Musculoskeletal:  No suspicious bone lesions identified.
IMPRESSION: 1. Expected postop changes from supracervical hysterectomy. Mild
stranding in subcutaneous fat near the umbilicus is nonspecific and
could be due to fibrosis or cellulitis. No evidence of hernia, mass,
or fluid collection.
2. Large stool burden noted; recommend clinical correlation for
possible constipation.
3. Stable moderate splenomegaly.

## 2020-01-31 ENCOUNTER — Ambulatory Visit: Payer: Self-pay | Admitting: Nurse Practitioner

## 2020-02-05 ENCOUNTER — Ambulatory Visit (INDEPENDENT_AMBULATORY_CARE_PROVIDER_SITE_OTHER): Payer: Medicaid Other | Admitting: Nurse Practitioner

## 2020-02-05 ENCOUNTER — Other Ambulatory Visit: Payer: Self-pay

## 2020-02-05 ENCOUNTER — Encounter: Payer: Self-pay | Admitting: Nurse Practitioner

## 2020-02-05 VITALS — BP 107/72 | HR 80 | Temp 98.1°F | Ht 62.0 in | Wt 118.8 lb

## 2020-02-05 DIAGNOSIS — D571 Sickle-cell disease without crisis: Secondary | ICD-10-CM

## 2020-02-05 DIAGNOSIS — F321 Major depressive disorder, single episode, moderate: Secondary | ICD-10-CM | POA: Diagnosis not present

## 2020-02-05 DIAGNOSIS — J810 Acute pulmonary edema: Secondary | ICD-10-CM | POA: Insufficient documentation

## 2020-02-05 DIAGNOSIS — E559 Vitamin D deficiency, unspecified: Secondary | ICD-10-CM | POA: Diagnosis not present

## 2020-02-05 LAB — POCT URINALYSIS DIPSTICK OB
Bilirubin, UA: NEGATIVE
Blood, UA: NEGATIVE
Glucose, UA: NEGATIVE
Ketones, UA: NEGATIVE
Leukocytes, UA: NEGATIVE
Nitrite, UA: NEGATIVE
POC,PROTEIN,UA: NEGATIVE
Spec Grav, UA: 1.025 (ref 1.010–1.025)
Urobilinogen, UA: 1 E.U./dL
pH, UA: 7 (ref 5.0–8.0)

## 2020-02-05 NOTE — Progress Notes (Signed)
[facility] [address] [place], [place]  [address] Phone:  [phone]   Fax:  [phone]   Established Patient Office Visit  Subjective:  Patient ID: Makayla Fletcher, female    DOB: [birth year]  Age: 24 y.o. MRN: [id number]  CC:  Chief Complaint  Patient presents with   Follow-up    HPI Makayla Fletcher presents for follow up. She  has a past medical history of Depression, Migraine without aura, without mention of intractable migraine without mention of status migrainosus, Sickle cell anemia (HCC),  She denies any sickle pain at this time.  She was admitted on 12/27/2019 for pain crisis which resulted in a 2-day admission.  Prior to this admission her last sickle cell crisis that was not able to be treated at home was 02/28/2019.  She does well with maintaining her sickle cell pain.  She is currently on oxycodone 5 mg.  Generally a quantity of 60 tablets lasts her 3 to 5 months. Denies fever, headache, cough, wheezing, shortness of breath, chest pains, abdominal pain, back pain, hip pain, or leg pain. Denies any open wounds, skin irritation.  She is currently working full-time third shift in sterile processing.  She admits that on some occasions she does have to lift some heavy items and after an 8-hour shift sometimes she goes home with increased pain.  Depression Patient complains of depression. She complains of depressed mood. Onset was approximately 7 years  ago. Symptoms have been gradually worsening since that time. Current symptoms include: depressed mood. Patient denies feelings of worthlessness/guilt, hopelessness, hypersomnia, impaired memory, psychomotor agitation, psychomotor retardation, recurrent thoughts of death, suicidal attempt, suicidal thoughts with specific plan and suicidal thoughts without plan. Family history significant for Unknown. Possible organic causes contributing are: endocrine/metabolic. Risk factors: previous episode of depression.  Previous treatment includes individual therapy. She complains of the following side effects from the treatment: none.  She admits that she was concerned about using her sertraline along with the oxycodone.  She has recently started using the first time efficacy is too early to decide.     She admits that she is sertraline.   FMLA  She uses Oxycodone prn.   She does not get cycles,   Past Medical History:  Diagnosis Date   Depression    Migraine without aura, without mention of intractable migraine without mention of status migrainosus    Sickle cell anemia (HCC)    Urinary tract infection July 2013   frequent     Past Surgical History:  Procedure Laterality Date   ABDOMINAL HYSTERECTOMY N/A 11/23/2018   Procedure: HYSTERECTOMY ABDOMINAL;  Surgeon: [place] BingPickens, Charlie, MD;  Location: [facility];  Service: Gynecology;  Laterality: N/A;   CYSTOSCOPY N/A 11/23/2018   Procedure: CYSTOSCOPY;  Surgeon: [place] BingPickens, Charlie, MD;  Location: [facility];  Service: Gynecology;  Laterality: N/A;   DILATION AND CURETTAGE OF UTERUS N/A 11/23/2018   Procedure: DILATATION AND CURETTAGE;  Surgeon: [employer] BingPickens, Charlie, MD;  Location: [facility];  Service: Gynecology;  Laterality: N/A;   LAPAROTOMY N/A 11/23/2018   Procedure: EXPLORATORY LAPAROTOMY;  Surgeon: [place] BingPickens, Charlie, MD;  Location: [facility];  Service: Gynecology;  Laterality: N/A;   NO PAST SURGERIES     WISDOM TOOTH EXTRACTION      Family History  Problem Relation Age of Onset   Migraines Father     Social History   Socioeconomic History   Marital status: Single    Spouse name:  Not on file   Number of children: 0   Years of education: college   Highest education level: Not on file  Occupational History   Occupation: MINOR    Employer: UNEMPLOYED   Occupation: Consulting civil engineer    Comment: rising freshman at [employer]   Occupation: MENTOR/TEACHER    Employer: [employer]  Tobacco Use   Smoking status: Never Smoker   Smokeless  tobacco: Never Used  Building services engineer Use: Never used  Substance and Sexual Activity   Alcohol use: Not Currently    Comment: Wine   Drug use: No   Sexual activity: Not Currently    Partners: Male    Birth control/protection: None    Comment: Depoprovera   Other Topics Concern   Not on file  Social History Narrative   Not on file   Social Determinants of Health   Financial Resource Strain:    Difficulty of Paying Living Expenses:   Food Insecurity:    Worried About Programme researcher, broadcasting/film/video in the Last Year:    Barista in the Last Year:   Transportation Needs:    Freight forwarder (Medical):    Lack of Transportation (Non-Medical):   Physical Activity:    Days of Exercise per Week:    Minutes of Exercise per Session:   Stress:    Feeling of Stress :   Social Connections:    Frequency of Communication with Friends and Family:    Frequency of Social Gatherings with Friends and Family:    Attends Religious Services:    Active Member of Clubs or Organizations:    Attends Engineer, structural:    Marital Status:   Intimate Partner Violence:    Fear of Current or Ex-Partner:    Emotionally Abused:    Physically Abused:    Sexually Abused:     Outpatient Medications Prior to Visit  Medication Sig Dispense Refill   ibuprofen (ADVIL) 800 MG tablet Take 1 tablet (800 mg total) by mouth 3 (three) times daily. 21 tablet 0   sertraline (ZOLOFT) 25 MG tablet Take 25 mg by mouth at bedtime.      ferrous sulfate 325 (65 FE) MG tablet TAKE 1 TABLET BY MOUTH TWICE A DAY (Patient not taking: Reported on 04/12/2019) 60 tablet 5   folic acid (FOLVITE) 1 MG tablet TAKE 1 TABLET (1 MG TOTAL) BY MOUTH DAILY. (Patient not taking: Reported on 11/22/2018) 30 tablet 6   polyethylene glycol (MIRALAX / GLYCOLAX) 17 g packet Take 17 g by mouth daily as needed for moderate constipation or severe constipation. (Patient not taking: Reported on 12/09/2018)  14 each 1   No facility-administered medications prior to visit.    No Known Allergies  ROS Review of Systems  All other systems reviewed and are negative.     Objective:    Physical Exam Vitals reviewed.  Constitutional:      General: She is not in acute distress.    Appearance: Normal appearance. She is normal weight. She is not ill-appearing, toxic-appearing or diaphoretic.  HENT:     Head: Normocephalic and atraumatic.     Nose: Nose normal.     Mouth/Throat:     Mouth: Mucous membranes are moist.  Eyes:     Pupils: Pupils are equal, round, and reactive to light.  Cardiovascular:     Rate and Rhythm: Normal rate and regular rhythm.     Pulses: Normal pulses.  Heart sounds: Normal heart sounds.  Pulmonary:     Effort: Pulmonary effort is normal.     Breath sounds: Normal breath sounds.  Abdominal:     General: Abdomen is flat. Bowel sounds are normal.     Palpations: Abdomen is soft.  Musculoskeletal:        General: Normal range of motion.     Cervical back: Normal range of motion.     Right lower leg: No edema.     Left lower leg: No edema.  Skin:    General: Skin is warm and dry.     Capillary Refill: Capillary refill takes less than 2 seconds.  Neurological:     General: No focal deficit present.     Mental Status: She is alert and oriented to person, place, and time.  Psychiatric:        Mood and Affect: Mood normal.        Behavior: Behavior normal.        Thought Content: Thought content normal.        Judgment: Judgment normal.     BP 107/72    Pulse 80    Temp 98.1 F (36.7 C)    Ht 5\' 2"  (1.575 m)    Wt 118 lb 12.8 oz (53.9 kg)    LMP 02/21/2018    SpO2 100%    BMI 21.73 kg/m  Wt Readings from Last 3 Encounters:  02/05/20 118 lb 12.8 oz (53.9 kg)  10/02/19 120 lb 1.6 oz (54.5 kg)  07/31/19 117 lb (53.1 kg)     There are no preventive care reminders to display for this patient.  There are no preventive care reminders to display for  this patient.  Lab Results  Component Value Date   TSH 0.638 06/06/2015   Lab Results  Component Value Date   WBC 5.7 12/29/2019   HGB 10.7 (L) 12/29/2019   HCT 29.4 (L) 12/29/2019   MCV 82.6 12/29/2019   PLT 98 (L) 12/29/2019   Lab Results  Component Value Date   NA 137 12/29/2019   K 3.7 12/29/2019   CO2 26 12/29/2019   GLUCOSE 102 (H) 12/29/2019   BUN <5 (L) 12/29/2019   CREATININE 0.62 12/29/2019   BILITOT 1.4 (H) 12/29/2019   ALKPHOS 62 12/29/2019   AST 21 12/29/2019   ALT 13 12/29/2019   PROT 8.5 (H) 12/29/2019   ALBUMIN 4.8 12/29/2019   CALCIUM 9.0 12/29/2019   ANIONGAP 8 12/29/2019   No results found for: CHOL No results found for: HDL No results found for: [facility] Lab Results  Component Value Date   TRIG 276 (H) 11/27/2018   No results found for: [facility] Lab Results  Component Value Date   HGBA1C 4.0 03/15/2014      Assessment & Plan:   Problem List Items Addressed This Visit      Other   Sickle cell disease (HCC) - Primary Ensure adequate hydration. Move frequently to reduce venous thromboembolism risk. Avoid situations that could lead to dehydration or could exacerbate pain Discussed S&S of infection, seizures, stroke acute chest, DVT and how important it is to seek medical attention Take medication as directed along with pain contract and overall compliance Discussed the risk related to opiate use; current use is PRN Possible FMLA needed   Vitamin D deficiency Uses OTC vitamin D  Will continue to monitor with repeat testing at next apt.    Depression, major, single episode, moderate (HCC) We will continue with  current regimen.  Patient to call if there is any changes or any concerns related to current regimen      No orders of the defined types were placed in this encounter.   Follow-up: Return in about 6 months (around 08/07/2020).    Barbette Merino, NP

## 2020-02-10 LAB — DRUG SCREEN 764883 11+OXYCO+ALC+CRT-BUND
Amphetamines, Urine: NEGATIVE ng/mL
BENZODIAZ UR QL: NEGATIVE ng/mL
Barbiturate: NEGATIVE ng/mL
Cocaine (Metabolite): NEGATIVE ng/mL
Creatinine: 128.2 mg/dL (ref 20.0–300.0)
Ethanol: NEGATIVE %
Meperidine: NEGATIVE ng/mL
Methadone Screen, Urine: NEGATIVE ng/mL
OPIATE SCREEN URINE: NEGATIVE ng/mL
Oxycodone/Oxymorphone, Urine: NEGATIVE ng/mL
Phencyclidine: NEGATIVE ng/mL
Propoxyphene: NEGATIVE ng/mL
Tramadol: NEGATIVE ng/mL
pH, Urine: 6.5 (ref 4.5–8.9)

## 2020-02-10 LAB — CANNABINOID CONFIRMATION, UR
CANNABINOIDS: POSITIVE — AB
Carboxy THC GC/MS Conf: 156 ng/mL

## 2020-03-12 ENCOUNTER — Telehealth: Payer: Self-pay | Admitting: Nurse Practitioner

## 2020-03-12 ENCOUNTER — Other Ambulatory Visit: Payer: Self-pay | Admitting: Nurse Practitioner

## 2020-03-12 DIAGNOSIS — D5700 Hb-SS disease with crisis, unspecified: Secondary | ICD-10-CM

## 2020-03-12 MED ORDER — OXYCODONE HCL 5 MG PO TABS: 5.0000 mg | ORAL_TABLET | ORAL | 0 refills | Status: DC | PRN

## 2020-03-13 NOTE — Telephone Encounter (Signed)
Done

## 2020-06-20 ENCOUNTER — Telehealth: Payer: Self-pay | Admitting: Nurse Practitioner

## 2020-06-20 NOTE — Telephone Encounter (Signed)
error 

## 2020-06-21 ENCOUNTER — Other Ambulatory Visit: Payer: Self-pay | Admitting: Nurse Practitioner

## 2020-06-21 DIAGNOSIS — D5700 Hb-SS disease with crisis, unspecified: Secondary | ICD-10-CM

## 2020-06-21 MED ORDER — OXYCODONE HCL 5 MG PO TABS: 5.0000 mg | ORAL_TABLET | ORAL | 0 refills | Status: DC | PRN

## 2020-06-21 NOTE — Telephone Encounter (Signed)
sent 

## 2020-07-23 HISTORY — PX: MULTIPLE TOOTH EXTRACTIONS: SHX2053

## 2020-08-12 ENCOUNTER — Ambulatory Visit: Payer: Self-pay | Admitting: Nurse Practitioner

## 2020-08-12 ENCOUNTER — Telehealth: Payer: Self-pay | Admitting: Nurse Practitioner

## 2020-08-12 NOTE — Telephone Encounter (Signed)
Pt was called to reschedule appointment that was a no show for Aug 12, 2020 VM was left  

## 2020-08-18 ENCOUNTER — Other Ambulatory Visit: Payer: Self-pay

## 2020-08-18 ENCOUNTER — Encounter (HOSPITAL_BASED_OUTPATIENT_CLINIC_OR_DEPARTMENT_OTHER): Payer: Self-pay | Admitting: Emergency Medicine

## 2020-08-18 ENCOUNTER — Emergency Department (HOSPITAL_BASED_OUTPATIENT_CLINIC_OR_DEPARTMENT_OTHER)
Admission: EM | Admit: 2020-08-18 | Discharge: 2020-08-18 | Disposition: A | Discharge status: Home / Self Care | Payer: Medicaid Other | Attending: Emergency Medicine | Admitting: Emergency Medicine

## 2020-08-18 DIAGNOSIS — K2900 Acute gastritis without bleeding: Secondary | ICD-10-CM | POA: Diagnosis not present

## 2020-08-18 DIAGNOSIS — R1010 Upper abdominal pain, unspecified: Secondary | ICD-10-CM | POA: Diagnosis present

## 2020-08-18 DIAGNOSIS — R197 Diarrhea, unspecified: Secondary | ICD-10-CM | POA: Diagnosis not present

## 2020-08-18 LAB — CBC WITH DIFFERENTIAL/PLATELET
Abs Immature Granulocytes: 0.03 10*3/uL (ref 0.00–0.07)
Basophils Absolute: 0 10*3/uL (ref 0.0–0.1)
Basophils Relative: 0 %
Eosinophils Absolute: 0 10*3/uL (ref 0.0–0.5)
Eosinophils Relative: 0 %
HCT: 26.7 % — ABNORMAL LOW (ref 36.0–46.0)
Hemoglobin: 9.9 g/dL — ABNORMAL LOW (ref 12.0–15.0)
Immature Granulocytes: 0 %
Lymphocytes Relative: 9 %
Lymphs Abs: 0.6 10*3/uL — ABNORMAL LOW (ref 0.7–4.0)
MCH: 30.4 pg (ref 26.0–34.0)
MCHC: 37.1 g/dL — ABNORMAL HIGH (ref 30.0–36.0)
MCV: 81.9 fL (ref 80.0–100.0)
Monocytes Absolute: 0.2 10*3/uL (ref 0.1–1.0)
Monocytes Relative: 3 %
Neutro Abs: 6.4 10*3/uL (ref 1.7–7.7)
Neutrophils Relative %: 88 %
Platelets: 87 10*3/uL — ABNORMAL LOW (ref 150–400)
RBC: 3.26 MIL/uL — ABNORMAL LOW (ref 3.87–5.11)
RDW: 13.8 % (ref 11.5–15.5)
Smear Review: NORMAL
WBC: 7.3 10*3/uL (ref 4.0–10.5)
nRBC: 0 % (ref 0.0–0.2)

## 2020-08-18 LAB — COMPREHENSIVE METABOLIC PANEL
ALT: 17 U/L (ref 0–44)
AST: 23 U/L (ref 15–41)
Albumin: 4.4 g/dL (ref 3.5–5.0)
Alkaline Phosphatase: 51 U/L (ref 38–126)
Anion gap: 7 (ref 5–15)
BUN: 7 mg/dL (ref 6–20)
CO2: 24 mmol/L (ref 22–32)
Calcium: 8.7 mg/dL — ABNORMAL LOW (ref 8.9–10.3)
Chloride: 108 mmol/L (ref 98–111)
Creatinine, Ser: 0.45 mg/dL (ref 0.44–1.00)
GFR, Estimated: >60 mL/min (ref >60)
Glucose, Bld: 91 mg/dL (ref 70–99)
Potassium: 3.4 mmol/L — ABNORMAL LOW (ref 3.5–5.1)
Sodium: 139 mmol/L (ref 135–145)
Total Bilirubin: 1.1 mg/dL (ref 0.3–1.2)
Total Protein: 7.6 g/dL (ref 6.5–8.1)

## 2020-08-18 LAB — URINALYSIS, ROUTINE W REFLEX MICROSCOPIC
Bilirubin Urine: NEGATIVE
Glucose, UA: NEGATIVE mg/dL
Hgb urine dipstick: NEGATIVE
Ketones, ur: NEGATIVE mg/dL
Leukocytes,Ua: NEGATIVE
Nitrite: NEGATIVE
Protein, ur: NEGATIVE mg/dL
Specific Gravity, Urine: 1.01 (ref 1.005–1.030)
pH: 8.5 — ABNORMAL HIGH (ref 5.0–8.0)

## 2020-08-18 LAB — LIPASE, BLOOD: Lipase: 28 U/L (ref 11–51)

## 2020-08-18 MED ORDER — ONDANSETRON 4 MG PO TBDP
4.0000 mg | ORAL_TABLET | Freq: Three times a day (TID) | ORAL | 0 refills | Status: DC | PRN
Start: 2020-08-18 — End: 2023-04-11

## 2020-08-18 MED ORDER — ONDANSETRON HCL 4 MG/2ML IJ SOLN
4.0000 mg | Freq: Once | INTRAMUSCULAR | Status: AC
  Administered 2020-08-18: 4 mg via INTRAVENOUS
  Filled 2020-08-18: qty 2

## 2020-08-18 MED ORDER — SODIUM CHLORIDE 0.9 % IV BOLUS
1000.0000 mL | Freq: Once | INTRAVENOUS | Status: AC
  Administered 2020-08-18: 1000 mL via INTRAVENOUS

## 2020-08-18 NOTE — ED Provider Notes (Signed)
[facility] EMERGENCY DEPARTMENT Provider Note  CSN: [id number] Arrival date & time: 08/18/20 0845    History Chief Complaint  Patient presents with  . Abdominal Pain    N/V/D    HPI  Makayla Fletcher is a 25 y.o. female with history of Sickle SS disease reports she was in her normal state of health yesterday, she was out celebrating her husband's birthday last night and had a few drinks. She woke up around 3am to go to the bathroom and began having watery diarrhea and multiple episodes of vomiting. She only had one additional diarrheal stool but had persistent vomiting and dry heaves during the morning associated with moderate to severe cramping of upper abdomen. She does not feel like this is related to her Hgb SS disease. She has not had any fevers. Given IVF, zofran and fentanyl by EMS enroute with improvement. She is still having some mild nausea now but pain is improved and described as a soreness.    Past Medical History:  Diagnosis Date  . Depression   . Migraine without aura, without mention of intractable migraine without mention of status migrainosus   . Sickle cell anemia (HCC)   . Urinary tract infection July 2013   frequent     Past Surgical History:  Procedure Laterality Date  . ABDOMINAL HYSTERECTOMY N/A 11/23/2018   Procedure: HYSTERECTOMY ABDOMINAL;  Surgeon: [place] Bing, MD;  Location: [facility];  Service: Gynecology;  Laterality: N/A;  . CYSTOSCOPY N/A 11/23/2018   Procedure: CYSTOSCOPY;  Surgeon: [place] Bing, MD;  Location: [facility];  Service: Gynecology;  Laterality: N/A;  . DILATION AND CURETTAGE OF UTERUS N/A 11/23/2018   Procedure: DILATATION AND CURETTAGE;  Surgeon: [place] Bing, MD;  Location: [facility];  Service: Gynecology;  Laterality: N/A;  . LAPAROTOMY N/A 11/23/2018   Procedure: EXPLORATORY LAPAROTOMY;  Surgeon: [place] Bing, MD;  Location: [facility];  Service: Gynecology;  Laterality: N/A;  . NO PAST SURGERIES    .  WISDOM TOOTH EXTRACTION      Family History  Problem Relation Age of Onset  . Migraines Father     Social History   Tobacco Use  . Smoking status: Never Smoker  . Smokeless tobacco: Never Used  Vaping Use  . Vaping Use: Never used  Substance Use Topics  . Alcohol use: Not Currently    Comment: Wine  . Drug use: No     Home Medications Prior to Admission medications   Medication Sig Start Date End Date Taking? Authorizing Provider  folic acid (FOLVITE) 1 MG tablet TAKE 1 TABLET (1 MG TOTAL) BY MOUTH DAILY. 03/08/17  Yes Massie Maroon, FNP  ondansetron (ZOFRAN ODT) 4 MG disintegrating tablet Take 1 tablet (4 mg total) by mouth every 8 (eight) hours as needed for nausea or vomiting. 08/18/20  Yes Pollyann Savoy, MD  sertraline (ZOLOFT) 25 MG tablet Take 25 mg by mouth at bedtime.  11/03/19  Yes [provider]  ferrous sulfate 325 (65 FE) MG tablet TAKE 1 TABLET BY MOUTH TWICE A DAY Patient not taking: No sig reported 11/23/18 08/18/20  Brock Bad, MD     Allergies    Patient has no known allergies.   Review of Systems   Review of Systems A comprehensive review of systems was completed and negative except as noted in HPI.    Physical Exam BP 99/60 (BP Location: Right Arm)   Pulse 77   Temp 98.3 F (  36.8 C) (Oral)   Resp 18   Ht 5\' 2"  (1.575 m)   Wt 48.9 kg   LMP 02/21/2018   SpO2 98%   BMI 19.70 kg/m   Physical Exam Vitals and nursing note reviewed.  Constitutional:      Appearance: Normal appearance.  HENT:     Head: Normocephalic and atraumatic.     Nose: Nose normal.     Mouth/Throat:     Mouth: Mucous membranes are moist.  Eyes:     Extraocular Movements: Extraocular movements intact.     Conjunctiva/sclera: Conjunctivae normal.  Cardiovascular:     Rate and Rhythm: Normal rate.  Pulmonary:     Effort: Pulmonary effort is normal.     Breath sounds: Normal breath sounds.  Abdominal:     General: Abdomen is flat.      Palpations: Abdomen is soft.     Tenderness: There is no abdominal tenderness.  Musculoskeletal:        General: No swelling. Normal range of motion.     Cervical back: Neck supple.  Skin:    General: Skin is warm and dry.  Neurological:     General: No focal deficit present.     Mental Status: She is alert.  Psychiatric:        Mood and Affect: Mood normal.      ED Results / Procedures / Treatments   Labs (all labs ordered are listed, but only abnormal results are displayed) Labs Reviewed  COMPREHENSIVE METABOLIC PANEL - Abnormal; Notable for the following components:      Result Value   Potassium 3.4 (*)    Calcium 8.7 (*)    All other components within normal limits  CBC WITH DIFFERENTIAL/PLATELET - Abnormal; Notable for the following components:   RBC 3.26 (*)    Hemoglobin 9.9 (*)    HCT 26.7 (*)    MCHC 37.1 (*)    Platelets 87 (*)    Lymphs Abs 0.6 (*)    All other components within normal limits  URINALYSIS, ROUTINE W REFLEX MICROSCOPIC - Abnormal; Notable for the following components:   pH 8.5 (*)    All other components within normal limits  LIPASE, BLOOD    EKG None   Radiology No results found.  Procedures Procedures  Medications Ordered in the ED Medications  ondansetron (ZOFRAN) injection 4 mg (4 mg Intravenous Given 08/18/20 1003)  sodium chloride 0.9 % bolus 1,000 mL (0 mLs Intravenous Stopped 08/18/20 1113)     MDM Rules/Calculators/A&P MDM Patient with abdominal pain, N/V/D, could be gastritis from alcohol use last night. Doubt this is related to sickle disease. Her abdomen is benign. Will check her labs, give additional Zofran and IVF for continued symptom management.  ED Course  I have reviewed the triage vital signs and the nursing notes.  Pertinent labs & imaging results that were available during my care of the patient were reviewed by me and considered in my medical decision making (see chart for details).  Clinical Course as of  08/18/20 1210  Sun Aug 18, 2020  1053 CMP and lipase are normal.  [CS]  1108 CBC with mild anemia, similar to previous. No leukocytosis.  [CS]  1116 Patient reports she is feeling better, would like to try a PO trial.  [CS]  1207 Patient tolerating PO fluids and would like to go home. Rx for Zofran, bland diet for the next day or two. RTED for any other concerns.  [CS]  Clinical Course User Index [CS] Pollyann Savoy, MD    Final Clinical Impression(s) / ED Diagnoses Final diagnoses:  Acute gastritis without hemorrhage, unspecified gastritis type    Rx / DC Orders ED Discharge Orders         Ordered    ondansetron (ZOFRAN ODT) 4 MG disintegrating tablet  Every 8 hours PRN        08/18/20 1210           Pollyann Savoy, MD 08/18/20 1210

## 2020-08-18 NOTE — ED Triage Notes (Signed)
Arrived via [facility], with N/V/D and abd cramping. Woke up at 0300 with vomiting, abd pain and diarrhea. States last night ate BBQ and had a couple drinks. Husband had 1 episode of vomiting. Given Zofran 4mg  and Fentanyl x 4. Pain 5/10 to lower abd

## 2020-08-20 ENCOUNTER — Telehealth: Payer: Self-pay

## 2020-08-20 NOTE — Telephone Encounter (Signed)
V/m left for pt to call in and mk appt.

## 2020-09-26 ENCOUNTER — Ambulatory Visit: Payer: Medicaid Other | Admitting: Nurse Practitioner

## 2020-09-26 ENCOUNTER — Other Ambulatory Visit: Payer: Self-pay

## 2020-09-26 ENCOUNTER — Encounter: Payer: Self-pay | Admitting: Nurse Practitioner

## 2020-09-26 VITALS — BP 107/65 | HR 88 | Temp 98.6°F | Ht 62.0 in | Wt 107.0 lb

## 2020-09-26 DIAGNOSIS — D571 Sickle-cell disease without crisis: Secondary | ICD-10-CM | POA: Diagnosis not present

## 2020-09-26 DIAGNOSIS — R627 Adult failure to thrive: Secondary | ICD-10-CM | POA: Diagnosis not present

## 2020-09-26 LAB — POCT URINALYSIS DIPSTICK
Bilirubin, UA: NEGATIVE
Blood, UA: NEGATIVE
Glucose, UA: NEGATIVE
Ketones, UA: NEGATIVE
Leukocytes, UA: NEGATIVE
Nitrite, UA: NEGATIVE
Protein, UA: POSITIVE — AB
Spec Grav, UA: 1.015
Urobilinogen, UA: 1 U/dL
pH, UA: 7

## 2020-09-26 NOTE — Progress Notes (Signed)
[facility] Patient [facility] [address] [place], [place]  [address] Phone:  [phone]   Fax:  [phone]   Established Patient Office Visit  Subjective:  Patient ID: Makayla Fletcher, female    DOB: 1996/02/21  Age: 25 y.o. MRN: [id number]  CC:  Chief Complaint  Patient presents with  . Follow-up    Follow up  discuss how to gain weight, no pain today     HPI Makayla Fletcher presents for follow up. She  has a past medical history of Depression, Migraine without aura, without mention of intractable migraine without mention of status migrainosus, Sickle cell anemia (HCC), and Urinary tract infection (July 2013).   She is in today for follow-up.  She denies any pain.  She continues to work full-time her family is doing well.  She was seen in the emergency department on 08/18/2020 related to nausea and vomiting.  She was diagnosed with gastritis.  She felt like this was related to her husband's birthday celebration and eating too much greasy food and drinking alcohol.  She admits that all her symptoms have resolved.  She is concerned about her overall weight.  She feels like at this age she would like to be a little heavier.  She does eat 3 meals per day however is not sure about portion sizes.  She denies any nausea or vomiting or abnormal bowel movement.  She does have a history of depression however denies this being an issue.  Denies fever, headache, cough, wheezing, shortness of breath, chest pains, abdominal pain, back pain, hip pain, or leg pain. Denies any open wounds, skin irritation. Last eye exam was discussed with set up and appointment.      Past Medical History:  Diagnosis Date  . Depression   . Migraine without aura, without mention of intractable migraine without mention of status migrainosus   . Sickle cell anemia (HCC)   . Urinary tract infection July 2013   frequent     Past Surgical History:  Procedure Laterality Date  . ABDOMINAL HYSTERECTOMY N/A  11/23/2018   Procedure: HYSTERECTOMY ABDOMINAL;  Surgeon: Rolette Bing, MD;  Location: [facility] LD ORS;  Service: Gynecology;  Laterality: N/A;  . CYSTOSCOPY N/A 11/23/2018   Procedure: CYSTOSCOPY;  Surgeon: Monowi Bing, MD;  Location: [facility] LD ORS;  Service: Gynecology;  Laterality: N/A;  . DILATION AND CURETTAGE OF UTERUS N/A 11/23/2018   Procedure: DILATATION AND CURETTAGE;  Surgeon: Micanopy Bing, MD;  Location: [facility] LD ORS;  Service: Gynecology;  Laterality: N/A;  . LAPAROTOMY N/A 11/23/2018   Procedure: EXPLORATORY LAPAROTOMY;  Surgeon: Holloway Bing, MD;  Location: [facility] LD ORS;  Service: Gynecology;  Laterality: N/A;  . MULTIPLE TOOTH EXTRACTIONS  07/23/2020  . NO PAST SURGERIES    . WISDOM TOOTH EXTRACTION      Family History  Problem Relation Age of Onset  . Migraines Father     Social History   Socioeconomic History  . Marital status: Single    Spouse name: Not on file  . Number of children: 0  . Years of education: college  . Highest education level: Not on file  Occupational History  . Occupation: MINOR    Employer: UNEMPLOYED  . Occupation: Consulting civil engineer    Comment: Engineer, water at [employer]  . Occupation: MENTOR/TEACHER    Employer: [employer]  Tobacco Use  . Smoking status: Never Smoker  . Smokeless tobacco: Never Used  Vaping Use  . Vaping Use: Never used  Substance and Sexual Activity  . Alcohol use: Not Currently    Comment: Wine  . Drug use: No  . Sexual activity: Not Currently    Partners: Male    Birth control/protection: None  Other Topics Concern  . Not on file  Social History Narrative  . Not on file   Social Determinants of Health   Financial Resource Strain: Not on file  Food Insecurity: Not on file  Transportation Needs: Not on file  Physical Activity: Not on file  Stress: Not on file  Social Connections: Not on file  Intimate Partner Violence: Not on file    Outpatient Medications Prior to Visit  Medication Sig Dispense Refill  . folic  acid (FOLVITE) 1 MG tablet TAKE 1 TABLET (1 MG TOTAL) BY MOUTH DAILY. 30 tablet 6  . ondansetron (ZOFRAN ODT) 4 MG disintegrating tablet Take 1 tablet (4 mg total) by mouth every 8 (eight) hours as needed for nausea or vomiting. 20 tablet 0  . sertraline (ZOLOFT) 25 MG tablet Take 25 mg by mouth at bedtime.      No facility-administered medications prior to visit.    No Known Allergies  ROS Review of Systems    Objective:    Physical Exam Constitutional:      General: She is not in acute distress.    Appearance: She is normal weight. She is not ill-appearing, toxic-appearing or diaphoretic.  HENT:     Head: Normocephalic and atraumatic.     Nose: Nose normal.     Mouth/Throat:     Mouth: Mucous membranes are moist.  Cardiovascular:     Rate and Rhythm: Normal rate and regular rhythm.     Pulses: Normal pulses.     Heart sounds: Normal heart sounds.  Pulmonary:     Effort: Pulmonary effort is normal.     Breath sounds: Normal breath sounds.  Abdominal:     Palpations: Abdomen is soft.  Musculoskeletal:        General: Normal range of motion.     Cervical back: Normal range of motion.  Skin:    General: Skin is warm and dry.     Capillary Refill: Capillary refill takes less than 2 seconds.  Neurological:     General: No focal deficit present.     Mental Status: She is alert and oriented to person, place, and time.  Psychiatric:        Mood and Affect: Mood normal.        Behavior: Behavior normal.        Thought Content: Thought content normal.        Judgment: Judgment normal.     BP 107/65 (Patient Position: Sitting, Cuff Size: Normal)   Pulse 88   Temp 98.6 F (37 C) (Temporal)   Ht 5\' 2"  (1.575 m)   Wt 107 lb (48.5 kg)   LMP 02/21/2018   SpO2 99%   BMI 19.57 kg/m  Wt Readings from Last 3 Encounters:  09/26/20 107 lb (48.5 kg)  08/18/20 107 lb 11.2 oz (48.9 kg)  02/05/20 118 lb 12.8 oz (53.9 kg)     There are no preventive care reminders to display  for this patient.  There are no preventive care reminders to display for this patient.  Lab Results  Component Value Date   TSH 0.638 06/06/2015   Lab Results  Component Value Date   WBC 7.3 08/18/2020   HGB 9.9 (L) 08/18/2020   HCT 26.7 (L) 08/18/2020  MCV 81.9 08/18/2020   PLT 87 (L) 08/18/2020   Lab Results  Component Value Date   NA 139 08/18/2020   K 3.4 (L) 08/18/2020   CO2 24 08/18/2020   GLUCOSE 91 08/18/2020   BUN 7 08/18/2020   CREATININE 0.45 08/18/2020   BILITOT 1.1 08/18/2020   ALKPHOS 51 08/18/2020   AST 23 08/18/2020   ALT 17 08/18/2020   PROT 7.6 08/18/2020   ALBUMIN 4.4 08/18/2020   CALCIUM 8.7 (L) 08/18/2020   ANIONGAP 7 08/18/2020   No results found for: CHOL No results found for: HDL No results found for: [facility] Lab Results  Component Value Date   TRIG 276 (H) 11/27/2018   No results found for: CHOLHDL Lab Results  Component Value Date   HGBA1C 4.0 03/15/2014      Assessment & Plan:   Problem List Items Addressed This Visit   None   Visit Diagnoses    Hb-SS disease without crisis (HCC)    -  Primary Well controlled Ensure adequate hydration. Move frequently to reduce venous thromboembolism risk. Avoid situations that could lead to dehydration or could exacerbate pain Discussed S&S of infection, seizures, stroke acute chest, DVT and how important it is to seek medical attention Take medication as directed along with pain contract and overall compliance    Relevant Orders   POCT Urinalysis Dipstick (Completed)  Poor weight gain in adult Discussed Educational information provided on high-calorie high-protein diet to enhance weight gain Will monitor at follow-up visit    No orders of the defined types were placed in this encounter.   Follow-up: Return in about 6 months (around 03/28/2021) for Follow up [facility] [address].    Barbette Merino, NP

## 2020-09-26 NOTE — Patient Instructions (Signed)

## 2021-02-06 ENCOUNTER — Telehealth: Payer: Self-pay

## 2021-02-06 NOTE — Telephone Encounter (Signed)
Oxycodone  °

## 2021-02-07 ENCOUNTER — Other Ambulatory Visit: Payer: Self-pay | Admitting: Nurse Practitioner

## 2021-02-07 DIAGNOSIS — D5700 Hb-SS disease with crisis, unspecified: Secondary | ICD-10-CM

## 2021-02-07 MED ORDER — OXYCODONE HCL 5 MG PO TABS: 5.0000 mg | ORAL_TABLET | ORAL | 0 refills | Status: AC | PRN

## 2021-03-28 ENCOUNTER — Ambulatory Visit: Payer: Self-pay | Admitting: Nurse Practitioner

## 2022-03-24 ENCOUNTER — Telehealth: Payer: Self-pay

## 2022-03-24 NOTE — Telephone Encounter (Signed)
Folic Acid  Oxycodone 5 mg

## 2022-04-08 ENCOUNTER — Ambulatory Visit: Payer: Self-pay | Admitting: Nurse Practitioner

## 2022-10-02 ENCOUNTER — Telehealth: Payer: Self-pay | Admitting: Nurse Practitioner

## 2022-10-02 NOTE — Telephone Encounter (Signed)
Caller & Relationship to patient:  MRN #  [id number]   Call Back Number:   Date of Last Office Visit: 04/08/2022     Date of Next Office Visit: 10/19/2022    Medication(s) to be Refilled: Oxycodone   Preferred Pharmacy:   ** Please notify patient to allow 48-72 hours to process** **Let patient know to contact pharmacy at the end of the day to make sure medication is ready. ** **If patient has not been seen in a year or longer, book an appointment **Advise to use MyChart for refill requests OR to contact their pharmacy

## 2022-10-19 ENCOUNTER — Ambulatory Visit: Payer: Medicaid Other | Admitting: Nurse Practitioner

## 2023-04-11 ENCOUNTER — Emergency Department (HOSPITAL_COMMUNITY): Payer: Medicaid Other

## 2023-04-11 ENCOUNTER — Emergency Department (HOSPITAL_COMMUNITY)
Admission: EM | Admit: 2023-04-11 | Discharge: 2023-04-11 | Disposition: A | Discharge status: Home / Self Care | Payer: Medicaid Other | Attending: Emergency Medicine | Admitting: Emergency Medicine

## 2023-04-11 ENCOUNTER — Other Ambulatory Visit: Payer: Self-pay

## 2023-04-11 DIAGNOSIS — E876 Hypokalemia: Secondary | ICD-10-CM | POA: Insufficient documentation

## 2023-04-11 DIAGNOSIS — D5700 Hb-SS disease with crisis, unspecified: Secondary | ICD-10-CM | POA: Diagnosis present

## 2023-04-11 LAB — COMPREHENSIVE METABOLIC PANEL
ALT: 13 U/L (ref 0–44)
AST: 19 U/L (ref 15–41)
Albumin: 4.7 g/dL (ref 3.5–5.0)
Alkaline Phosphatase: 53 U/L (ref 38–126)
Anion gap: 8 (ref 5–15)
BUN: 7 mg/dL (ref 6–20)
CO2: 25 mmol/L (ref 22–32)
Calcium: 9.3 mg/dL (ref 8.9–10.3)
Chloride: 108 mmol/L (ref 98–111)
Creatinine, Ser: 0.47 mg/dL (ref 0.44–1.00)
GFR, Estimated: >60 mL/min (ref >60)
Glucose, Bld: 102 mg/dL — ABNORMAL HIGH (ref 70–99)
Potassium: 3.4 mmol/L — ABNORMAL LOW (ref 3.5–5.1)
Sodium: 141 mmol/L (ref 135–145)
Total Bilirubin: 1.5 mg/dL — ABNORMAL HIGH (ref 0.3–1.2)
Total Protein: 8.1 g/dL (ref 6.5–8.1)

## 2023-04-11 LAB — RETICULOCYTES
Immature Retic Fract: 28.4 % — ABNORMAL HIGH (ref 2.3–15.9)
RBC.: 3.55 MIL/uL — ABNORMAL LOW (ref 3.87–5.11)
Retic Count, Absolute: 102.2 10*3/uL (ref 19.0–186.0)
Retic Ct Pct: 2.9 % (ref 0.4–3.1)

## 2023-04-11 LAB — CBC WITH DIFFERENTIAL/PLATELET
Abs Immature Granulocytes: 0.05 10*3/uL (ref 0.00–0.07)
Basophils Absolute: 0 10*3/uL (ref 0.0–0.1)
Basophils Relative: 0 %
Eosinophils Absolute: 0 10*3/uL (ref 0.0–0.5)
Eosinophils Relative: 0 %
HCT: 29.9 % — ABNORMAL LOW (ref 36.0–46.0)
Hemoglobin: 11.1 g/dL — ABNORMAL LOW (ref 12.0–15.0)
Immature Granulocytes: 1 %
Lymphocytes Relative: 8 %
Lymphs Abs: 0.7 10*3/uL (ref 0.7–4.0)
MCH: 31.5 pg (ref 26.0–34.0)
MCHC: 37.1 g/dL — ABNORMAL HIGH (ref 30.0–36.0)
MCV: 84.9 fL (ref 80.0–100.0)
Monocytes Absolute: 0.4 10*3/uL (ref 0.1–1.0)
Monocytes Relative: 4 %
Neutro Abs: 8 10*3/uL — ABNORMAL HIGH (ref 1.7–7.7)
Neutrophils Relative %: 87 %
Platelets: 96 10*3/uL — ABNORMAL LOW (ref 150–400)
RBC: 3.52 MIL/uL — ABNORMAL LOW (ref 3.87–5.11)
RDW: 13.4 % (ref 11.5–15.5)
WBC: 9.2 10*3/uL (ref 4.0–10.5)
nRBC: 0 % (ref 0.0–0.2)

## 2023-04-11 LAB — TROPONIN I (HIGH SENSITIVITY)
Troponin I (High Sensitivity): 5 ng/L (ref <18)
Troponin I (High Sensitivity): <2 ng/L (ref <18)

## 2023-04-11 MED ORDER — HYDROMORPHONE HCL 1 MG/ML IJ SOLN
1.0000 mg | Freq: Once | INTRAMUSCULAR | Status: AC
Start: 2023-04-11 — End: 2023-04-11
  Administered 2023-04-11: 1 mg via INTRAVENOUS
  Filled 2023-04-11: qty 1

## 2023-04-11 MED ORDER — ONDANSETRON HCL 4 MG PO TABS: 4.0000 mg | ORAL_TABLET | Freq: Four times a day (QID) | ORAL | 0 refills | Status: AC

## 2023-04-11 MED ORDER — ONDANSETRON HCL 4 MG/2ML IJ SOLN
4.0000 mg | Freq: Once | INTRAMUSCULAR | Status: AC
  Administered 2023-04-11: 4 mg via INTRAVENOUS
  Filled 2023-04-11: qty 2

## 2023-04-11 MED ORDER — SODIUM CHLORIDE 0.9 % IV SOLN
12.5000 mg | Freq: Once | INTRAVENOUS | Status: AC
  Administered 2023-04-11: 12.5 mg via INTRAVENOUS
  Filled 2023-04-11: qty 12.5

## 2023-04-11 MED ORDER — KETOROLAC TROMETHAMINE 15 MG/ML IJ SOLN
15.0000 mg | Freq: Once | INTRAMUSCULAR | Status: AC
  Administered 2023-04-11: 15 mg via INTRAVENOUS
  Filled 2023-04-11: qty 1

## 2023-04-11 MED ORDER — OXYCODONE-ACETAMINOPHEN 5-325 MG PO TABS: 1.0000 | ORAL_TABLET | Freq: Four times a day (QID) | ORAL | 0 refills | Status: AC | PRN

## 2023-04-11 MED ORDER — SODIUM CHLORIDE 0.45 % IV SOLN: INTRAVENOUS | Status: DC

## 2023-04-11 NOTE — ED Triage Notes (Signed)
Arrives with c/o sickle cell pain, onset around 1900 last night. Pain through her chest and her left arm. Took ibuprofen 2000 and again around MN without relief.

## 2023-04-11 NOTE — ED Notes (Signed)
Pt would like to wait on lab draw with IV start when in treatment room

## 2023-04-11 NOTE — ED Provider Notes (Signed)
[place] EMERGENCY DEPARTMENT AT [facility] Provider Note   CSN: [id number] Arrival date & time: 04/11/23  0455     History  Chief Complaint  Patient presents with   Sickle Cell Pain Crisis    Makayla Fletcher is a 27 y.o. female history of sickle cell, GAD, migraines, acute pulmonary edema presented with chest pain that began last night and believes she is in a sickle cell pain crisis.  Patient states been a few years since she has had a sickle cell pain crisis and normally takes ibuprofen or Tylenol which seems to help along with hot baths.  Patient began to notice chest pain last night that was in the upper portion of her chest and radiates to her left arm but denies any paresthesias or weakness.  Patient denies abdominal pain, nausea/vomiting, shortness of breath.  Patient is normal at home over-the-counter meds did not help last night.  Patient dates chest pain does not radiate and is unsure what is exacerbating it but believes to be an a sickle cell pain crisis.  Home Medications Prior to Admission medications   Medication Sig Start Date End Date Taking? Authorizing Provider  acetaminophen (TYLENOL) 325 MG tablet Take 650 mg by mouth every 6 (six) hours as needed for moderate pain (pain score 4-6).   Yes [provider]  ibuprofen (ADVIL) 200 MG tablet Take 200 mg by mouth every 6 (six) hours as needed for moderate pain (pain score 4-6).   Yes [provider]  ondansetron (ZOFRAN) 4 MG tablet Take 1 tablet (4 mg total) by mouth every 6 (six) hours. 04/11/23  Yes Netta Corrigan, PA-C  oxyCODONE-acetaminophen (PERCOCET/ROXICET) 5-325 MG tablet Take 1 tablet by mouth every 6 (six) hours as needed for up to 5 days for severe pain (pain score 7-10). 04/11/23 04/16/23 Yes Netta Corrigan, PA-C  ferrous sulfate 325 (65 FE) MG tablet TAKE 1 TABLET BY MOUTH TWICE A DAY Patient not taking: No sig reported 11/23/18 08/18/20  Brock Bad, MD       Allergies    Patient has no known allergies.    Review of Systems   Review of Systems  Physical Exam Updated Vital Signs BP 128/89   Pulse 94   Temp 98.9 F (37.2 C) (Oral)   Resp (!) 21   Ht 5\' 2"  (1.575 m)   Wt 48.5 kg   LMP 02/21/2018   SpO2 100%   BMI 19.57 kg/m  Physical Exam Vitals reviewed.  Constitutional:      General: She is not in acute distress. HENT:     Head: Normocephalic and atraumatic.  Eyes:     Extraocular Movements: Extraocular movements intact.     Conjunctiva/sclera: Conjunctivae normal.     Pupils: Pupils are equal, round, and reactive to light.  Cardiovascular:     Rate and Rhythm: Normal rate and regular rhythm.     Pulses: Normal pulses.     Heart sounds: Normal heart sounds.     Comments: 2+ bilateral radial/dorsalis pedis pulses with regular rate Pulmonary:     Effort: Pulmonary effort is normal. No respiratory distress.     Breath sounds: Normal breath sounds.  Abdominal:     Palpations: Abdomen is soft.     Tenderness: There is no abdominal tenderness. There is no guarding or rebound.  Musculoskeletal:        General: Normal range of motion.     Cervical back: Normal range of motion and  neck supple.     Comments: 5 out of 5 bilateral grip/leg extension strength Left arm does not appear edematous No calf tenderness or leg swelling noted  Skin:    General: Skin is warm and dry.     Capillary Refill: Capillary refill takes less than 2 seconds.     Comments: No overlying skin color changes  Neurological:     General: No focal deficit present.     Mental Status: She is alert and oriented to person, place, and time.     Comments: Sensation intact in all 4 limbs  Psychiatric:        Mood and Affect: Mood normal.     ED Results / Procedures / Treatments   Labs (all labs ordered are listed, but only abnormal results are displayed) Labs Reviewed  COMPREHENSIVE METABOLIC PANEL - Abnormal; Notable for the following components:       Result Value   Potassium 3.4 (*)    Glucose, Bld 102 (*)    Total Bilirubin 1.5 (*)    All other components within normal limits  CBC WITH DIFFERENTIAL/PLATELET - Abnormal; Notable for the following components:   RBC 3.52 (*)    Hemoglobin 11.1 (*)    HCT 29.9 (*)    MCHC 37.1 (*)    Platelets 96 (*)    Neutro Abs 8.0 (*)    All other components within normal limits  RETICULOCYTES - Abnormal; Notable for the following components:   RBC. 3.55 (*)    Immature Retic Fract 28.4 (*)    All other components within normal limits  TROPONIN I (HIGH SENSITIVITY)  TROPONIN I (HIGH SENSITIVITY)    EKG EKG Interpretation Date/Time:  Sunday April 11 2023 05:08:02 EDT Ventricular Rate:  92 PR Interval:  134 QRS Duration:  83 QT Interval:  351 QTC Calculation: 435 R Axis:   67  Text Interpretation: Sinus rhythm Confirmed by Alona Bene [phone]) on 04/11/2023 5:11:05 AM  Radiology DG Chest Port 1 View  Result Date: 04/11/2023 CLINICAL DATA:  27 year old female history of sickle cell disease presenting with chest pain. EXAM: PORTABLE CHEST 1 VIEW COMPARISON:  Chest x-ray 11/26/2018. FINDINGS: Lung volumes are normal. No consolidative airspace disease. Mild coarsening of interstitial markings in the lungs bilaterally, similar to prior studies. No pleural effusions. No pneumothorax. No pulmonary nodule or mass noted. Pulmonary vasculature and the cardiomediastinal silhouette are within normal limits. IMPRESSION: No radiographic evidence of acute cardiopulmonary disease. Electronically Signed   By: Trudie Reed M.D.   On: 04/11/2023 06:49    Procedures Procedures    Medications Ordered in ED Medications  0.45 % sodium chloride infusion ( Intravenous New Bag/Given 04/11/23 0718)  ketorolac (TORADOL) 15 MG/ML injection 15 mg (has no administration in time range)  HYDROmorphone (DILAUDID) injection 1 mg (1 mg Intravenous Given 04/11/23 0718)  diphenhydrAMINE (BENADRYL) 12.5 mg in sodium  chloride 0.9 % 50 mL IVPB (0 mg Intravenous Stopped 04/11/23 0920)  ondansetron (ZOFRAN) injection 4 mg (4 mg Intravenous Given 04/11/23 0920)    ED Course/ Medical Decision Making/ A&P                                 Medical Decision Making Amount and/or Complexity of Data Reviewed Labs: ordered.  Risk Prescription drug management.   [name] 27 y.o. presented today for sickle cell pain. Working DDx that I considered at this time includes, but not  limited to, sickle cell pain crisis, anemia, aplastic crisis, acute chest syndrome, AKI, VTE, hemolysis, hyperbilirubinemia.  R/o DDx: sickle cell pain crisis, anemia, aplastic crisis, acute chest syndrome, AKI, VTE, hemolysis, hyperbilirubinemia Anemia, aplastic crisis, acute chest syndrome, AKI, VTE, hemolysis: These are considered less likely due to history of present illness, physical exam, labs/imaging findings  Review of prior external notes: 12/29/2019 discharge summary  Unique Tests and My Interpretation:  CBC with differential: Unremarkable, better than previous result CMP: Mild hypokalemia 3.4 Reticulocytes: Elevated immature reticulocyte 28.4 up from 3 years ago EKG: Sinus 90 bpm, no ST elevations or depressions noted, no blocks noted Chest x-ray: No acute cardiopulmonary changes Troponin: 5, <2  Discussion with Independent Historian: None  Discussion of Management of Tests: None  Risk: Medium: prescription drug management  Risk Stratification Score: None  Staffed with Deretha Emory, MD  Plan: On exam patient was no acute distress with stable vitals.  Patient's physical exam is unremarkable..  Sickle cell labs and imaging ordered along with fluids and pain management.  Patient does not come to the ER often and so as patient requested Dilaudid will give 1 round of Dilaudid along with Benadryl patient's request and initiate fluids.  Chest x-ray.  Did not show any infiltrates that be indicative of acute chest  however  troponins were added.  Patient is not hypoxic and lungs are clear to auscultation bilaterally and patient does not appear to have asymmetrical edematous limbs noted be indicative of DVT or respiratory symptoms that would be indicative of PE and so advanced imaging not ordered at this time.  Will monitor and reassess.  On reassessment patient states she was doing better with the pain meds however was nauseous from the Dilaudid and will give Zofran.  Awaiting delta troponin and will reassess.  Patient's labs at this time are reassuring.  Delta troponin negative.  Patient that she feels much better after the pain meds and is asymptomatic now.  Patient's vitals have remained stable and patient remained stable throughout the past 5 hours of her stay and do not feel at this point that patient needs admission for crisis as I do not believe she is in a crisis.  I spoke to the patient and she agrees that she can be discharged and follow-up with the sickle cell clinic.  Will discharge and encouraged over-the-counter anti-inflammatories but will prescribe Percocet for pain not controlled by this along with Zofran as patient to get nauseous after receiving the Dilaudid today.  Encouraged fluids and to return to ER symptoms are change or worsen.  Spoke to the attending with this plan and we agreed that patient at this time is stable to discharge.  Patient was given return precautions. Patient stable for discharge at this time.  Patient verbalized understanding of plan.  This chart was dictated using voice recognition software.  Despite best efforts to proofread,  errors can occur which can change the documentation meaning.         Final Clinical Impression(s) / ED Diagnoses Final diagnoses:  Sickle-cell disease with pain (HCC)    Rx / DC Orders ED Discharge Orders          Ordered    oxyCODONE-acetaminophen (PERCOCET/ROXICET) 5-325 MG tablet  Every 6 hours PRN        04/11/23 0951    ondansetron  (ZOFRAN) 4 MG tablet  Every 6 hours        04/11/23 0951  Netta Corrigan, PA-C 04/11/23 1006    Vanetta Mulders, MD 04/14/23 [phone]

## 2023-04-11 NOTE — Discharge Instructions (Signed)
Please follow-up with the sickle cell clinic in regards to recent and ER visit.  Today your labs and imaging are reassuring and you got better with pain meds.  You most likely had sickle cell pain but was not in a crisis as your labs are reassuring.  You may continue to take your Tylenol and ibuprofen every 6 hours needed for pain however I prescribed you Percocet to take for pain not controlled by.  I have also attached Zofran to help you get nauseous with the Percocet.  Please remain hydrated and take warm baths as you stated these have helped in the past and if symptoms change or worsen please return to ER.

## 2023-08-30 ENCOUNTER — Emergency Department (HOSPITAL_COMMUNITY)
Admission: EM | Admit: 2023-08-30 | Discharge: 2023-08-30 | Disposition: A | Discharge status: Home / Self Care | Attending: Emergency Medicine | Admitting: Emergency Medicine

## 2023-08-30 ENCOUNTER — Emergency Department (HOSPITAL_COMMUNITY)

## 2023-08-30 DIAGNOSIS — R197 Diarrhea, unspecified: Secondary | ICD-10-CM | POA: Insufficient documentation

## 2023-08-30 DIAGNOSIS — R112 Nausea with vomiting, unspecified: Secondary | ICD-10-CM | POA: Diagnosis present

## 2023-08-30 LAB — URINALYSIS, W/ REFLEX TO CULTURE (INFECTION SUSPECTED)
Bilirubin Urine: NEGATIVE
Glucose, UA: NEGATIVE mg/dL
Hgb urine dipstick: NEGATIVE
Ketones, ur: NEGATIVE mg/dL
Leukocytes,Ua: NEGATIVE
Nitrite: NEGATIVE
Protein, ur: NEGATIVE mg/dL
Specific Gravity, Urine: 1.006 (ref 1.005–1.030)
pH: 8 (ref 5.0–8.0)

## 2023-08-30 LAB — CBC WITH DIFFERENTIAL/PLATELET
Abs Immature Granulocytes: 0.02 10*3/uL (ref 0.00–0.07)
Basophils Absolute: 0 10*3/uL (ref 0.0–0.1)
Basophils Relative: 0 %
Eosinophils Absolute: 0.2 10*3/uL (ref 0.0–0.5)
Eosinophils Relative: 3 %
HCT: 29.1 % — ABNORMAL LOW (ref 36.0–46.0)
Hemoglobin: 10.8 g/dL — ABNORMAL LOW (ref 12.0–15.0)
Immature Granulocytes: 0 %
Lymphocytes Relative: 13 %
Lymphs Abs: 0.9 10*3/uL (ref 0.7–4.0)
MCH: 31 pg (ref 26.0–34.0)
MCHC: 37.1 g/dL — ABNORMAL HIGH (ref 30.0–36.0)
MCV: 83.6 fL (ref 80.0–100.0)
Monocytes Absolute: 0.3 10*3/uL (ref 0.1–1.0)
Monocytes Relative: 4 %
Neutro Abs: 5.2 10*3/uL (ref 1.7–7.7)
Neutrophils Relative %: 80 %
Platelets: 78 10*3/uL — ABNORMAL LOW (ref 150–400)
RBC: 3.48 MIL/uL — ABNORMAL LOW (ref 3.87–5.11)
RDW: 13.2 % (ref 11.5–15.5)
WBC: 6.6 10*3/uL (ref 4.0–10.5)
nRBC: 0 % (ref 0.0–0.2)

## 2023-08-30 LAB — COMPREHENSIVE METABOLIC PANEL
ALT: 12 U/L (ref 0–44)
AST: 16 U/L (ref 15–41)
Albumin: 4.4 g/dL (ref 3.5–5.0)
Alkaline Phosphatase: 41 U/L (ref 38–126)
Anion gap: 6 (ref 5–15)
BUN: 7 mg/dL (ref 6–20)
CO2: 26 mmol/L (ref 22–32)
Calcium: 9 mg/dL (ref 8.9–10.3)
Chloride: 109 mmol/L (ref 98–111)
Creatinine, Ser: 0.32 mg/dL — ABNORMAL LOW (ref 0.44–1.00)
GFR, Estimated: >60 mL/min (ref >60)
Glucose, Bld: 95 mg/dL (ref 70–99)
Potassium: 3.5 mmol/L (ref 3.5–5.1)
Sodium: 141 mmol/L (ref 135–145)
Total Bilirubin: 1.3 mg/dL — ABNORMAL HIGH (ref 0.0–1.2)
Total Protein: 7.4 g/dL (ref 6.5–8.1)

## 2023-08-30 LAB — RETICULOCYTES
Immature Retic Fract: 26 % — ABNORMAL HIGH (ref 2.3–15.9)
RBC.: 3.52 MIL/uL — ABNORMAL LOW (ref 3.87–5.11)
Retic Count, Absolute: 86.2 10*3/uL (ref 19.0–186.0)
Retic Ct Pct: 2.5 % (ref 0.4–3.1)

## 2023-08-30 LAB — LIPASE, BLOOD: Lipase: 29 U/L (ref 11–51)

## 2023-08-30 LAB — HCG, SERUM, QUALITATIVE: Preg, Serum: NEGATIVE

## 2023-08-30 MED ORDER — ONDANSETRON HCL 4 MG/2ML IJ SOLN
4.0000 mg | Freq: Once | INTRAMUSCULAR | Status: AC
  Administered 2023-08-30: 4 mg via INTRAVENOUS
  Filled 2023-08-30: qty 2

## 2023-08-30 MED ORDER — ONDANSETRON HCL 4 MG PO TABS: 4.0000 mg | ORAL_TABLET | Freq: Four times a day (QID) | ORAL | 0 refills | Status: AC

## 2023-08-30 MED ORDER — IOHEXOL 300 MG/ML  SOLN
100.0000 mL | Freq: Once | INTRAMUSCULAR | Status: AC | PRN
  Administered 2023-08-30: 100 mL via INTRAVENOUS

## 2023-08-30 MED ORDER — SODIUM CHLORIDE 0.9 % IV BOLUS
1000.0000 mL | Freq: Once | INTRAVENOUS | Status: AC
  Administered 2023-08-30: 1000 mL via INTRAVENOUS

## 2023-08-30 NOTE — ED Provider Notes (Signed)
 [place] EMERGENCY DEPARTMENT AT [facility] Provider Note   CSN: [id number] Arrival date & time: 08/30/23  4098     History  Chief Complaint  Patient presents with   Abdominal Pain    Makayla Fletcher is a 28 y.o. female.  28 year old female with prior medical history as detailed below presents for evaluation.  Patient complains of persistently intermittent nausea and vomiting x 2 to 3 weeks.  She reports intermittent diarrhea as well.  Patient denies fever.  She denies significant abdominal pain.  She reports that she has taken multiple OTC medications at home without improvement of symptoms.    The history is provided by the patient and medical records.       Home Medications Prior to Admission medications   Medication Sig Start Date End Date Taking? Authorizing Provider  ondansetron (ZOFRAN) 4 MG tablet Take 1 tablet (4 mg total) by mouth every 6 (six) hours. 08/30/23  Yes Wynetta Fines, MD  acetaminophen (TYLENOL) 325 MG tablet Take 650 mg by mouth every 6 (six) hours as needed for moderate pain (pain score 4-6).    [provider]  ibuprofen (ADVIL) 200 MG tablet Take 200 mg by mouth every 6 (six) hours as needed for moderate pain (pain score 4-6).    [provider]  ondansetron (ZOFRAN) 4 MG tablet Take 1 tablet (4 mg total) by mouth every 6 (six) hours. 04/11/23   Netta Corrigan, PA-C  ferrous sulfate 325 (65 FE) MG tablet TAKE 1 TABLET BY MOUTH TWICE A DAY Patient not taking: No sig reported 11/23/18 08/18/20  Brock Bad, MD      Allergies    Patient has no known allergies.    Review of Systems   Review of Systems  All other systems reviewed and are negative.   Physical Exam Updated Vital Signs BP 109/75 (BP Location: Left Arm)   Pulse (!) 101   Temp 98.3 F (36.8 C) (Oral)   Resp 18   LMP 02/21/2018   SpO2 100%  Physical Exam Vitals and nursing note reviewed.  Constitutional:      General: She is not in acute  distress.    Appearance: Normal appearance. She is well-developed.  HENT:     Head: Normocephalic and atraumatic.  Eyes:     Conjunctiva/sclera: Conjunctivae normal.     Pupils: Pupils are equal, round, and reactive to light.  Cardiovascular:     Rate and Rhythm: Normal rate and regular rhythm.     Heart sounds: Normal heart sounds.  Pulmonary:     Effort: Pulmonary effort is normal. No respiratory distress.     Breath sounds: Normal breath sounds.  Abdominal:     General: There is no distension.     Palpations: Abdomen is soft.     Tenderness: There is no abdominal tenderness.  Musculoskeletal:        General: No deformity. Normal range of motion.     Cervical back: Normal range of motion and neck supple.  Skin:    General: Skin is warm and dry.  Neurological:     General: No focal deficit present.     Mental Status: She is alert and oriented to person, place, and time.     ED Results / Procedures / Treatments   Labs (all labs ordered are listed, but only abnormal results are displayed) Labs Reviewed  COMPREHENSIVE METABOLIC PANEL - Abnormal; Notable for the following components:  Result Value   Creatinine, Ser 0.32 (*)    Total Bilirubin 1.3 (*)    All other components within normal limits  CBC WITH DIFFERENTIAL/PLATELET - Abnormal; Notable for the following components:   RBC 3.48 (*)    Hemoglobin 10.8 (*)    HCT 29.1 (*)    MCHC 37.1 (*)    Platelets 78 (*)    All other components within normal limits  RETICULOCYTES - Abnormal; Notable for the following components:   RBC. 3.52 (*)    Immature Retic Fract 26.0 (*)    All other components within normal limits  URINALYSIS, W/ REFLEX TO CULTURE (INFECTION SUSPECTED) - Abnormal; Notable for the following components:   Color, Urine STRAW (*)    Bacteria, UA RARE (*)    All other components within normal limits  LIPASE, BLOOD  HCG, SERUM, QUALITATIVE    EKG None  Radiology CT ABDOMEN PELVIS W  CONTRAST Result Date: 08/30/2023 CLINICAL DATA:  Abdominal pain, acute, nonlocalized EXAM: CT ABDOMEN AND PELVIS WITH CONTRAST TECHNIQUE: Multidetector CT imaging of the abdomen and pelvis was performed using the standard protocol following bolus administration of intravenous contrast. RADIATION DOSE REDUCTION: This exam was performed according to the departmental dose-optimization program which includes automated exposure control, adjustment of the mA and/or kV according to patient size and/or use of iterative reconstruction technique. CONTRAST:  OMNIPAQUE IOHEXOL 300 MG/ML  SOLN COMPARISON:  CT scan abdomen and pelvis from 03/02/2019. FINDINGS: Lower chest: There are nonspecific predominantly peripheral subpleural scarring/atelectasis throughout bilateral lung bases. No dense consolidation or lung collapse. No pleural effusion. The heart is normal in size. No pericardial effusion. Hepatobiliary: The liver is normal in size. Non-cirrhotic configuration. No suspicious mass. These is mild diffuse hepatic steatosis. No intrahepatic or extrahepatic bile duct dilation. No calcified gallstones. Normal gallbladder wall thickness. No pericholecystic inflammatory changes. Pancreas: Unremarkable. No pancreatic ductal dilatation or surrounding inflammatory changes. Spleen: There is splenic surface nodularity, which is grossly similar to the prior study. This is of indeterminate etiology. Note is made of top-normal spleen measuring up to 12.5 cm in length. No discrete focal lesion. Adrenals/Urinary Tract: Adrenal glands are unremarkable. No suspicious renal mass. No hydronephrosis. No renal or ureteric calculi. Unremarkable urinary bladder. Stomach/Bowel: No disproportionate dilation of the small or large bowel loops. No evidence of abnormal bowel wall thickening or inflammatory changes. Portion of appendix is seen in the right lower quadrant and appears unremarkable. Vascular/Lymphatic: No ascites or pneumoperitoneum.  No abdominal or pelvic lymphadenopathy, by size criteria. No aneurysmal dilation of the major abdominal arteries. Reproductive: The uterus is surgically absent. No large adnexal mass. Other: The visualized soft tissues and abdominal wall are unremarkable. Musculoskeletal: No suspicious osseous lesions. Patchy areas of sclerosis in the imaged bones, similar to the prior study and likely representing sequela of sickle cell disease. IMPRESSION: 1. No acute inflammatory process identified within the abdomen or pelvis. 2. Top-normal sized lobulated spleen, similar to the prior study. No focal lesion. 3. Multiple other nonacute observations, as described above. Electronically Signed   By: Jules Schick M.D.   On: 08/30/2023 11:29    Procedures Procedures    Medications Ordered in ED Medications  sodium chloride 0.9 % bolus 1,000 mL (1,000 mLs Intravenous New Bag/Given 08/30/23 0915)  ondansetron (ZOFRAN) injection 4 mg (4 mg Intravenous Given 08/30/23 0912)  iohexol (OMNIPAQUE) 300 MG/ML solution 100 mL (100 mLs Intravenous Contrast Given 08/30/23 1028)    ED Course/ Medical Decision Making/ A&P  Medical Decision Making Amount and/or Complexity of Data Reviewed Labs: ordered. Radiology: ordered.  Risk Prescription drug management.    Medical Screen Complete  This patient presented to the ED with complaint of nausea, vomiting.  This complaint involves an extensive number of treatment options. The initial differential diagnosis includes, but is not limited to, metabolic abnormality, obstruction, or intra-abdominal infection, etc.  This presentation is: Acute, Chronic, Self-Limited, Previously Undiagnosed, Uncertain Prognosis, Complicated, Systemic Symptoms, and Threat to Life/Bodily Function  Patient presents with nausea and vomiting x 2 to 3 weeks.  Patient does not appear to be toxic on initial evaluation.  Screening labs are without significant  abnormality.  Obtained CT imaging is also without significant acute abnormality.  With IV fluids and antiemetics patient reports feeling much improved.  She now desires discharge home.  She does understand need for close outpatient follow-up.  Strict return precautions given understood.    Co morbidities that complicated the patient's evaluation  See HPI   Additional history obtained:  External records from outside sources obtained and reviewed including prior ED visits and prior Inpatient records.    Problem List / ED Course:  Nausea, vomiting   Reevaluation:  After the interventions noted above, I reevaluated the patient and found that they have: improved  Disposition:  After consideration of the diagnostic results and the patients response to treatment, I feel that the patent would benefit from close outpatient follow-up.         Final Clinical Impression(s) / ED Diagnoses Final diagnoses:  Nausea and vomiting, unspecified vomiting type    Rx / DC Orders ED Discharge Orders          Ordered    ondansetron (ZOFRAN) 4 MG tablet  Every 6 hours        08/30/23 1135              Wynetta Fines, MD 08/30/23 1139

## 2023-08-30 NOTE — Discharge Instructions (Signed)
 Return for any problem.  ?

## 2023-08-30 NOTE — ED Triage Notes (Signed)
 Pt c/o persistent LUQ abd pain with N/V for approximately three weeks. Also endorses intermittent diarrhea. Pt has hx of hx of sickle cell disease, and states this pain is different than normal SCC. LMP approximately one month ago, and pt has hx of partial hysterectomy

## 2023-09-20 ENCOUNTER — Encounter: Payer: Self-pay | Admitting: Nurse Practitioner

## 2023-09-20 ENCOUNTER — Ambulatory Visit (INDEPENDENT_AMBULATORY_CARE_PROVIDER_SITE_OTHER): Admitting: Nurse Practitioner

## 2023-09-20 VITALS — BP 100/71 | HR 85 | Temp 98.4°F | Wt 109.2 lb

## 2023-09-20 DIAGNOSIS — K5900 Constipation, unspecified: Secondary | ICD-10-CM | POA: Diagnosis not present

## 2023-09-20 DIAGNOSIS — Z3200 Encounter for pregnancy test, result unknown: Secondary | ICD-10-CM | POA: Diagnosis not present

## 2023-09-20 LAB — POCT URINE PREGNANCY: Preg Test, Ur: NEGATIVE

## 2023-09-20 MED ORDER — DOCUSATE SODIUM 100 MG PO CAPS: 100.0000 mg | ORAL_CAPSULE | Freq: Two times a day (BID) | ORAL | 0 refills | Status: AC

## 2023-09-20 NOTE — Progress Notes (Signed)
 Subjective   Patient ID: Makayla Fletcher, female    DOB: [birth year], 28 y.o.   MRN: [id number]  Chief Complaint  Patient presents with   Nausea    Patient stated that she's been having stomach pain and throwing up for about a month.    Referring provider: Barbette Merino, NP  Makayla Fletcher is a 28 y.o. female with Past Medical History: No date: Depression No date: Migraine without aura, without mention of intractable  migraine without mention of status migrainosus No date: Sickle cell anemia (HCC) July 2013: Urinary tract infection     Comment:  frequent    HPI  Patient presents today for acute visit.  She was recently seen in the ED for nausea and vomiting.  She did have a CT scan done which was overall clear.  She states that she has been having nausea and vomiting and stomach pain in the epigastric region for about a month now.  She has been having issues with constipation.  We will trial restora.  Samples given in office today.  Will order Colace for constipation. Denies f/c/s, n/v/d, hemoptysis, PND, leg swelling Denies chest pain or edema    No Known Allergies  Immunization History  Administered Date(s) Administered   Influenza,inj,Quad PF,6+ Mos 03/13/2014, 05/28/2015, 03/16/2017   Pneumococcal Polysaccharide-23 04/24/2014   Tdap 03/30/2016, 11/24/2018    Tobacco History: Social History   Tobacco Use  Smoking Status Never  Smokeless Tobacco Never   Counseling given: Not Answered   Outpatient Encounter Medications as of 09/20/2023  Medication Sig   acetaminophen (TYLENOL) 325 MG tablet Take 650 mg by mouth every 6 (six) hours as needed for moderate pain (pain score 4-6).   docusate sodium (COLACE) 100 MG capsule Take 1 capsule (100 mg total) by mouth 2 (two) times daily.   ibuprofen (ADVIL) 200 MG tablet Take 200 mg by mouth every 6 (six) hours as needed for moderate pain (pain score 4-6).   ondansetron (ZOFRAN) 4 MG tablet Take 1 tablet (4 mg total)  by mouth every 6 (six) hours. (Patient not taking: Reported on 09/20/2023)   ondansetron (ZOFRAN) 4 MG tablet Take 1 tablet (4 mg total) by mouth every 6 (six) hours. (Patient not taking: Reported on 09/20/2023)   [DISCONTINUED] ferrous sulfate 325 (65 FE) MG tablet TAKE 1 TABLET BY MOUTH TWICE A DAY (Patient not taking: No sig reported)   No facility-administered encounter medications on file as of 09/20/2023.    Review of Systems  Review of Systems  Constitutional: Negative.   HENT: Negative.    Cardiovascular: Negative.   Gastrointestinal: Negative.   Allergic/Immunologic: Negative.   Neurological: Negative.   Psychiatric/Behavioral: Negative.       Objective:   BP 100/71   Pulse 85   Temp 98.4 F (36.9 C) (Oral)   Wt 109 lb 3.2 oz (49.5 kg)   LMP 02/21/2018   SpO2 100%   BMI 19.97 kg/m   Wt Readings from Last 5 Encounters:  09/20/23 109 lb 3.2 oz (49.5 kg)  04/11/23 107 lb (48.5 kg)  09/26/20 107 lb (48.5 kg)  08/18/20 107 lb 11.2 oz (48.9 kg)  02/05/20 118 lb 12.8 oz (53.9 kg)     Physical Exam Vitals and nursing note reviewed.  Constitutional:      General: She is not in acute distress.    Appearance: She is well-developed.  Cardiovascular:     Rate and Rhythm: Normal rate and regular rhythm.  Pulmonary:  Effort: Pulmonary effort is normal.     Breath sounds: Normal breath sounds.  Neurological:     Mental Status: She is alert and oriented to person, place, and time.       Assessment & Plan:   Encounter for pregnancy test, result unknown -     POCT urine pregnancy  Constipation, unspecified constipation type -     Docusate Sodium; Take 1 capsule (100 mg total) by mouth 2 (two) times daily.  Dispense: 10 capsule; Refill: 0     Return if symptoms worsen or fail to improve.   Ivonne Andrew, NP 09/20/2023

## 2023-09-20 NOTE — Patient Instructions (Signed)
 1. Encounter for pregnancy test, result unknown (Primary)  - POCT urine pregnancy  2. Constipation, unspecified constipation type  - docusate sodium (COLACE) 100 MG capsule; Take 1 capsule (100 mg total) by mouth 2 (two) times daily.  Dispense: 10 capsule; Refill: 0

## 2023-10-07 ENCOUNTER — Encounter: Payer: Self-pay | Admitting: Nurse Practitioner

## 2023-10-18 ENCOUNTER — Other Ambulatory Visit: Payer: Self-pay | Admitting: Nurse Practitioner

## 2023-10-18 DIAGNOSIS — R110 Nausea: Secondary | ICD-10-CM
